# Patient Record
Sex: Female | Born: 1965
Health system: Southern US, Community
[De-identification: ages and names within clinical notes are randomized; demographics above are authoritative.]

## PROBLEM LIST (undated history)

## (undated) DIAGNOSIS — F319 Bipolar disorder, unspecified: Secondary | ICD-10-CM

## (undated) DIAGNOSIS — R51 Headache: Secondary | ICD-10-CM

## (undated) DIAGNOSIS — R519 Headache, unspecified: Secondary | ICD-10-CM

## (undated) DIAGNOSIS — K5790 Diverticulosis of intestine, part unspecified, without perforation or abscess without bleeding: Secondary | ICD-10-CM

## (undated) DIAGNOSIS — J302 Other seasonal allergic rhinitis: Secondary | ICD-10-CM

## (undated) DIAGNOSIS — Z8659 Personal history of other mental and behavioral disorders: Secondary | ICD-10-CM

## (undated) DIAGNOSIS — G43909 Migraine, unspecified, not intractable, without status migrainosus: Secondary | ICD-10-CM

## (undated) DIAGNOSIS — J45909 Unspecified asthma, uncomplicated: Secondary | ICD-10-CM

## (undated) DIAGNOSIS — K589 Irritable bowel syndrome without diarrhea: Secondary | ICD-10-CM

## (undated) DIAGNOSIS — K2100 Gastro-esophageal reflux disease with esophagitis, without bleeding: Secondary | ICD-10-CM

## (undated) DIAGNOSIS — G4710 Hypersomnia, unspecified: Secondary | ICD-10-CM

## (undated) DIAGNOSIS — C259 Malignant neoplasm of pancreas, unspecified: Secondary | ICD-10-CM

## (undated) DIAGNOSIS — R413 Other amnesia: Secondary | ICD-10-CM

## (undated) DIAGNOSIS — O139 Gestational [pregnancy-induced] hypertension without significant proteinuria, unspecified trimester: Secondary | ICD-10-CM

## (undated) HISTORY — DX: Unspecified asthma, uncomplicated: J45.909

## (undated) HISTORY — PX: NASAL SINUS SURGERY: SHX719

## (undated) HISTORY — PX: KNEE SURGERY: SHX244

## (undated) HISTORY — DX: Personal history of other mental and behavioral disorders: Z86.59

## (undated) HISTORY — PX: NOSE SURGERY: SHX723

## (undated) HISTORY — DX: Diverticulosis of intestine, part unspecified, without perforation or abscess without bleeding: K57.90

## (undated) HISTORY — DX: Other amnesia: R41.3

## (undated) HISTORY — PX: WRIST SURGERY: SHX841

## (undated) HISTORY — DX: Headache: R51

## (undated) HISTORY — DX: Migraine, unspecified, not intractable, without status migrainosus: G43.909

## (undated) HISTORY — PX: REPLACEMENT TOTAL KNEE: SUR1224

## (undated) HISTORY — DX: Headache, unspecified: R51.9

## (undated) HISTORY — DX: Other seasonal allergic rhinitis: J30.2

## (undated) HISTORY — DX: Irritable bowel syndrome, unspecified: K58.9

## (undated) HISTORY — DX: Bipolar disorder, unspecified: F31.9

## (undated) HISTORY — DX: Hypersomnia, unspecified: G47.10

## (undated) HISTORY — DX: Gestational (pregnancy-induced) hypertension without significant proteinuria, unspecified trimester: O13.9

---

## 1998-08-27 HISTORY — PX: ABDOMINAL HYSTERECTOMY: SHX81

## 2011-10-26 ENCOUNTER — Other Ambulatory Visit: Payer: Self-pay | Admitting: Family Medicine

## 2011-10-26 DIAGNOSIS — S0990XS Unspecified injury of head, sequela: Secondary | ICD-10-CM

## 2011-10-26 DIAGNOSIS — G44309 Post-traumatic headache, unspecified, not intractable: Secondary | ICD-10-CM

## 2011-10-27 ENCOUNTER — Ambulatory Visit
Admission: RE | Admit: 2011-10-27 | Discharge: 2011-10-27 | Disposition: A | Discharge status: Home / Self Care | Payer: 59 | Source: Ambulatory Visit | Attending: Family Medicine | Admitting: Family Medicine

## 2011-10-27 DIAGNOSIS — S0990XS Unspecified injury of head, sequela: Secondary | ICD-10-CM

## 2011-10-27 DIAGNOSIS — G44309 Post-traumatic headache, unspecified, not intractable: Secondary | ICD-10-CM

## 2013-03-17 ENCOUNTER — Other Ambulatory Visit: Payer: Self-pay | Admitting: Family Medicine

## 2013-03-17 DIAGNOSIS — R221 Localized swelling, mass and lump, neck: Secondary | ICD-10-CM

## 2013-03-19 ENCOUNTER — Ambulatory Visit
Admission: RE | Admit: 2013-03-19 | Discharge: 2013-03-19 | Disposition: A | Discharge status: Home / Self Care | Payer: 59 | Source: Ambulatory Visit | Attending: Family Medicine | Admitting: Family Medicine

## 2013-03-19 DIAGNOSIS — R221 Localized swelling, mass and lump, neck: Secondary | ICD-10-CM

## 2013-04-02 ENCOUNTER — Encounter: Payer: Self-pay | Admitting: Neurology

## 2013-04-03 ENCOUNTER — Encounter: Payer: Self-pay | Admitting: Neurology

## 2013-04-03 ENCOUNTER — Ambulatory Visit (INDEPENDENT_AMBULATORY_CARE_PROVIDER_SITE_OTHER): Payer: 59 | Admitting: Neurology

## 2013-04-03 VITALS — BP 129/68 | HR 78 | Ht 69.0 in | Wt 243.0 lb

## 2013-04-03 DIAGNOSIS — R4189 Other symptoms and signs involving cognitive functions and awareness: Secondary | ICD-10-CM | POA: Insufficient documentation

## 2013-04-03 DIAGNOSIS — F32A Depression, unspecified: Secondary | ICD-10-CM | POA: Insufficient documentation

## 2013-04-03 DIAGNOSIS — F3289 Other specified depressive episodes: Secondary | ICD-10-CM

## 2013-04-03 DIAGNOSIS — R5381 Other malaise: Secondary | ICD-10-CM

## 2013-04-03 DIAGNOSIS — F09 Unspecified mental disorder due to known physiological condition: Secondary | ICD-10-CM

## 2013-04-03 DIAGNOSIS — F329 Major depressive disorder, single episode, unspecified: Secondary | ICD-10-CM

## 2013-04-03 DIAGNOSIS — R5383 Other fatigue: Secondary | ICD-10-CM

## 2013-04-03 NOTE — Patient Instructions (Signed)
Overall you are doing fairly well but I do want to suggest a few things today:   As far as diagnostic testing:  1) I ordered some blood work for today. Please follow up with this prior to leaving the office 2) I put a referral in for a sleep study to help determine if sleep apnea may be the cause of your symptoms. You will be called to schedule this  I would like to see you back in 3-25months, sooner if we need to. Please call us with any interim questions, concerns, problems, updates or refill requests.   Please also call us for any test results so we can go over those with you on the phone.  My clinical assistant and will answer any of your questions and relay your messages to me and also relay most of my messages to you.   Our phone number is [phone]. We also have an after hours call service for urgent matters and there is a physician on-call for urgent questions. For any emergencies you know to call 911 or go to the nearest emergency room

## 2013-04-03 NOTE — Progress Notes (Signed)
[facility]  Provider:  Dr Hosie Poisson Referring Provider: Kandyce Rud, MD Primary Care Physician:  No primary provider on file.  No chief complaint on file.   HPI:  Robin [name] is a 47 y.o. female here as a referral from Dr. Larwance Sachs for cognitive decline  She notes her partner initially noted some memory difficulties around 2-3 years ago. Predominately trouble with short term memory. She has difficulty recalling what she is doing that day, trouble with conversation, word finding difficulty. Denies any trouble with remembering where she per her keys or wallet. No episodes of getting lost. Notes that her partner always pays the finances, do to the patient forgetting to pay them. She also notes some difficulty with remote memory, trouble remembering appointments in the past. Feels this is getting progressively worse over the past few years. She is a retired Emergency planning/management officer, currently working as a Database administrator. She does note some difficulty at work, she has trouble remembering things about her students. No history of head trauma. No family history of dementia.  She diagnosed with borderline, bipolar depression around 2-3 years ago and also history of migraines. She is followed by Dr. Bufford Buttner, psychiatrist. Is currently on a regimen of Seroquel the to do and Lamictal for mood stabilization. She has been on this for a few the ears and feels it is working well. Currently followed by Dr. Neale Burly for migraines, typically at 9-10 migraines per month. Currently takes Ativan 150 mg for headache. Feels this helps. Also will take baclofen 10 mg as needed for breakthrough headaches, typically does this one to 2 times per month. Will take Flexeril one to 2 times a month for severe pain.  Difficulty staying asleep, wakes up frequently, snores. No apnea events noted. Wakes up not feeling refreshed. Fatigued throughout the day.  Has associates degree.  Review of Systems: Out of a complete 14  system review, the patient complains of only the following symptoms, and all other reviewed systems are negative. Positive for weight gain fatigue snoring memory loss headache depression not enough sleep and  History   Social History  . Marital Status: Married    Spouse Name: N/A    Number of Children: N/A  . Years of Education: N/A   Occupational History  . Not on file.   Social History Main Topics  . Smoking status: Never Smoker   . Smokeless tobacco: Never Used  . Alcohol Use: 6.0 oz/week    12 drink(s) per week     Comment: monthly   . Drug Use: Not on file  . Sexually Active: Not on file   Other Topics Concern  . Not on file   Social History Narrative   Pneumonia vaccine 2005          No family history on file.  Past Medical History  Diagnosis Date  . Seasonal allergies   . HA (headache)     No past surgical history on file.  Current Outpatient Prescriptions  Medication Sig Dispense Refill  . baclofen (LIORESAL) 10 MG tablet Take 10 mg by mouth daily. Take with food or milk.      . cyclobenzaprine (FLEXERIL) 10 MG tablet Take 10 mg by mouth 3 (three) times daily as needed for muscle spasms.      Marland Kitchen lamoTRIgine (LAMICTAL) 100 MG tablet Take 100 mg by mouth daily.      Marland Kitchen lurasidone (LATUDA) 40 MG TABS tablet Take 40 mg by mouth daily with breakfast.      .  QUEtiapine (SEROQUEL XR) 300 MG 24 hr tablet Take 300 mg by mouth at bedtime.      Marland Kitchen zonisamide (ZONEGRAN) 100 MG capsule Take 100 mg by mouth daily.       No current facility-administered medications for this visit.    Allergies as of 04/03/2013 - never reviewed  Allergen Reaction Noted  . Aspirin Nausea And Vomiting and Rash 04/02/2013  . Bactrim (sulfamethoxazole-tmp ds)  04/02/2013  . Penicillins  04/02/2013    Vitals: There were no vitals taken for this visit. Last Weight:  Wt Readings from Last 1 Encounters:  No data found for Wt   Last Height:   Ht Readings from Last 1 Encounters:  No  data found for Ht     Physical exam: Exam: Gen: NAD, conversant, obese Eyes: anicteric sclerae, moist conjunctivae HENT: Atraumati Neck: Trachea midline; supple,  Lungs: CTA, no wheezing, rales, rhonic                          CV: RRR, no MRG Abdomen: Soft, non-tender;  Extremities: No peripheral edema  Skin: Normal temperature, no rash,  Psych: Appropriate affect, pleasant  Neuro: MS: AA&Ox3, appropriately interactive, normal affect   Attention: WORLD backwards  Speech: fluent w/o paraphasic error  Memory: MOCA 25/30, -1 orientation, -1 language, -3 delayed recall  CN: PERRL, EOMI no nystagmus, no ptosis, sensation intact to LT V1-V3 bilat, face symmetric, no weakness, hearing grossly intact, palate elevates symmetrically, shoulder shrug 5/5 bilat,  tongue protrudes midline, no fasiculations noted.  Motor: normal bulk and tone Strength: 5/5  In all extremities  Coord: rapid alternating and point-to-point (FNF, HTS) movements intact.  Reflexes: symmetrical, bilat downgoing toes  Sens: LT intact in all extremities  Gait: posture, stance, stride and arm-swing normal. Tandem gait intact. Able to walk on heels and toes. Romberg absent.   Assessment:  After physical and neurologic examination, review of laboratory studies, imaging, neurophysiology testing and pre-existing records, assessment will be reviewed on the problem list.  Plan:  Treatment plan and additional workup will be reviewed under Problem List.  Robin [name] is a pleasant 47 year old African American woman who presents for initial evaluation of 2-3 years of progressive cognitive decline. She notes the most difficulty with short-term memory, though does note some remote memory recall difficulties. She expresses difficulty sleeping and constant fatigue throughout the day.  1)Cognitive decline: Unclear etiology. Patient is on multiple medications that could explain a cognitive decline, she also has potential  sleep apnea/insomnia which may be contributing to her cognitive decline. History of depression also raises the possibility of depression mimicking dementia. Will need to rule out reversible and tubal causes before attributing cognitive decline T1 of the prior causes -Check B12, MMA and homocystine, TSH and RPR -Referral for sleep study -In the future can consider brain MRI, LP and/or formal neuropsych cognitive testing  2)Fatigue: Based on body habitus and clinical history suspect patient may be suffering from sleep apnea -Will place referral for sleep study  3) Depression: followed by psychiatry, currently stable  Followup in 3-4 months

## 2013-04-07 LAB — RPR: RPR: NONREACTIVE

## 2013-04-07 LAB — HOMOCYSTEINE: Homocysteine: 11 umol/L (ref 0.0–15.0)

## 2013-04-07 LAB — METHYLMALONIC ACID(MMA), RND URINE

## 2013-04-24 ENCOUNTER — Telehealth: Payer: Self-pay | Admitting: Neurology

## 2013-04-24 NOTE — Telephone Encounter (Signed)
ERROR

## 2013-04-29 ENCOUNTER — Encounter: Payer: Self-pay | Admitting: Neurology

## 2013-04-29 ENCOUNTER — Ambulatory Visit (INDEPENDENT_AMBULATORY_CARE_PROVIDER_SITE_OTHER): Payer: 59 | Admitting: Neurology

## 2013-04-29 VITALS — BP 109/69 | HR 65 | Resp 16 | Ht 69.0 in | Wt 236.0 lb

## 2013-04-29 DIAGNOSIS — R413 Other amnesia: Secondary | ICD-10-CM | POA: Insufficient documentation

## 2013-04-29 DIAGNOSIS — R519 Headache, unspecified: Secondary | ICD-10-CM

## 2013-04-29 DIAGNOSIS — R0609 Other forms of dyspnea: Secondary | ICD-10-CM

## 2013-04-29 DIAGNOSIS — G4710 Hypersomnia, unspecified: Secondary | ICD-10-CM | POA: Insufficient documentation

## 2013-04-29 DIAGNOSIS — E669 Obesity, unspecified: Secondary | ICD-10-CM

## 2013-04-29 DIAGNOSIS — R0683 Snoring: Secondary | ICD-10-CM

## 2013-04-29 DIAGNOSIS — R51 Headache: Secondary | ICD-10-CM

## 2013-04-29 HISTORY — DX: Hypersomnia, unspecified: G47.10

## 2013-04-29 NOTE — Progress Notes (Signed)
[facility]  Provider:  Melvyn Novas, M D  Referring Provider: Omelia Blackwater, DO Primary Care Physician:  No primary provider on file.  Chief Complaint  Patient presents with  . Follow-up    memory, MOCA-29/30,rm 10    HPI:  Robin [name] is a 47 y.o. female  Is seen here as a referral/ revisit  from Dr. Hosie Poisson for  a sleep evaluation.  Number reported that Robin [name], is a pleasant 47 year old [place] American female presented to initially for an evaluation of progressive cognitive decline. She has noticed moles difficulties with short-term memory. As the patient has a history of depression the possibility of depression causing a subdural dementia was raised. He check vitamin B12 levels homocystine, TSH, and RPR with negative results and referred the patient also for sleep study, based on her body habitus with an elevated BMI and her report of fatigue and excessive daytime sleepiness.  My assistant today repeated the [facility] testing the patient scored 29 of 30 points which is entirely normal. She also couldn't name 24 warts. The only point loss was in one of 5  recall words. Robin [name] today and Doris the Epworth sleepiness score at 17/24 points. She reports that she is snoring rather loudly, but she has not had any report as to witnessed apneas.  The patient regular bedtime is at 30 p.m. and she rises in the morning at 6 AM. She reports it is very variable when she falls asleep it can be immediately after going to bed, and at other times she will need up to an hour or not have nocturia, she does not have morning headaches, she may wake up with a headache but not from the headache she reports further. Migraines have been followed by Dr. Neale Burly of headache and vomits and. She awakens with a dr. She does not feel refreshed after sleep. She does not schedule and he nap time but sometimes just follows irresistible urge to fall asleep. A nap the last between 30-60  minutes, and seems to cause more sleepiness and fatigue rather than restoring the patient.   She denies recurring nightmares, not particular vivid dreams. No dream intrusion and no hallucination.no sleep paralysis.   Patient does not recall any history of a sleep disorder in her past, family history of sleep disorders is not known either . 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 As the not trauma to the neck upper airway or facial skull there has been no surgery to the jaw, neck, but she had a deviated septum repair in 1992.   Review of Systems: Out of a complete 14 system review, the patient complains of only the following symptoms, and all other reviewed systems are negative.  epworth 17, FSS 30 , GDS 5 points.   History   Social History  . Marital Status: Married    Spouse Name: Haynes Bast    Number of Children: 2  . Years of Education: AA   Occupational History  .      [employer]   Social History Main Topics  . Smoking status: Never Smoker   . Smokeless tobacco: Never Used  . Alcohol Use: 6.0 oz/week    12 drink(s) per week     Comment: 1-2 monthly   . Drug Use: No  . Sexual Activity: Not on file   Other Topics Concern  .  Not on file   Social History Narrative   Pneumonia vaccine 2005   Patient lives at home with wife and 2 sons.    Patient works in [employer].    Patient has her AA             Family History  Problem Relation Age of Onset  . High blood pressure Maternal Grandfather   . Depression Mother   . Breast cancer Maternal Grandmother   . Diabetes Maternal Grandmother   . High blood pressure Maternal Grandmother   . Breast cancer Sister     Past Medical History  Diagnosis Date  . Seasonal allergies   . HA (headache)   . Hypertension   . Migraine   . Memory loss, short term     dr Hosie Poisson 04-03-13   .  Hypersomnia, persistent 04/29/2013    Past Surgical History  Procedure Laterality Date  . Knee surgery Right 96,98,00,02,05,2014  . Wrist surgery Right [phone]  . Abdominal hysterectomy  00  . Nose surgery      [id number]    Current Outpatient Prescriptions  Medication Sig Dispense Refill  . baclofen (LIORESAL) 10 MG tablet Take 10 mg by mouth daily. Take with food or milk.      . cyclobenzaprine (FLEXERIL) 10 MG tablet Take 10 mg by mouth 3 (three) times daily as needed for muscle spasms.      Marland Kitchen lamoTRIgine (LAMICTAL) 100 MG tablet Take 100 mg by mouth daily.      Marland Kitchen lurasidone (LATUDA) 40 MG TABS tablet Take 40 mg by mouth daily with breakfast.      . meloxicam (MOBIC) 15 MG tablet Take 15 mg by mouth daily.      . QUEtiapine (SEROQUEL XR) 300 MG 24 hr tablet Take 300 mg by mouth at bedtime.      Marland Kitchen zonisamide (ZONEGRAN) 100 MG capsule Take 150 mg by mouth daily.        No current facility-administered medications for this visit.    Allergies as of 04/29/2013 - Review Complete 04/29/2013  Allergen Reaction Noted  . Aspirin Nausea And Vomiting and Rash 04/02/2013  . Bactrim [sulfamethoxazole-tmp ds]  04/02/2013  . Penicillins  04/02/2013    Vitals: BP 109/69  Pulse 65  Resp 16  Ht 5\' 9"  (1.753 m)  Wt 236 lb (107.049 kg)  BMI 34.84 kg/m2 Last Weight:  Wt Readings from Last 1 Encounters:  04/29/13 236 lb (107.049 kg)   Last Height:   Ht Readings from Last 1 Encounters:  04/29/13 5\' 9"  (1.753 m)    Physical exam:  General: The patient is awake, alert and appears not in acute distress. The patient is well groomed. Head: Normocephalic, atraumatic. Neck is supple. Mallampati 3 , neck circumference: 15.5 and right jaw  TMJ, no nasal deviation. status post  surgery . No history of retainer, no retrognathia .  Cardiovascular:  Regular rate and rhythm, without  murmurs or carotid bruit, and without distended neck veins. Respiratory: Lungs are clear to auscultation. Skin:   Without evidence of edema, or rash Trunk: BMI is elevated - patient  has normal posture.  Neurologic exam : The patient is awake and alert, oriented to place and time.  Memory subjective  described as impaired - MOCA was 29-30 . There is a normal attention span & concentration ability. Speech is fluent without dysarthria, dysphonia or aphasia. Mood and affect are appropriate.  Cranial nerves: Pupils are equal and briskly reactive to light. Funduscopic exam  without  evidence of pallor or edema. Extraocular movements  in vertical and horizontal planes intact and without nystagmus. Visual fields by finger perimetry are intact. Hearing to finger rub intact.  Facial sensation intact to fine touch. Facial motor strength is symmetric and tongue and uvula move midline.  Motor exam:   Normal tone and normal muscle bulk and symmetric normal strength in all extremities. The patient reports left shoulder and neck pain and right temporal headaches.    Sensory:  Fine touch, pinprick and vibration were tested in all extremities. Proprioception is  normal.  Coordination: Rapid alternating movements in the fingers/hands is tested and normal. Finger-to-nose maneuver without evidence of ataxia, dysmetria or tremor.  Gait and station: Patient walks without assistive device and is able and assisted stool climb up to the exam table. Strength within normal limits. Stance is stable and normal.  Steps are unfragmented. Deep tendon reflexes: in the  upper and lower extremities are symmetric and intact. Babinski maneuver response is  downgoing.   Assessment:  After physical and neurologic examination, review of laboratory studies, imaging, neurophysiology testing and pre-existing records, assessment is that of high risk for OSA based on BMI and history, Mallpompati. 4 , invisible UVULA .   2) left shoulder pain the patient prefers sleeping in supine which would probably aggravate any condition of apnea. It also will lead  to louder snoring given that her oral airway is restricted to begin with. 30 Epworth 17 , but no symptoms of narcolepsy.   Plan:  Treatment plan and additional workup : PSG SPLIT ordered, follow up with Dr Hosie Poisson if negative for OSA.  Tennisball methode.  For positional , behavior changes.  No changes in medication.

## 2013-04-29 NOTE — Patient Instructions (Signed)
Polysomnography (Sleep Studies) Polysomnography (PSG) is a series of tests used for detecting (diagnosing) obstructive sleep apnea and other sleep disorders. The tests measure how some parts of your body are working while you are sleeping. The tests are extensive and expensive. They are done in a sleep lab or hospital, and vary from center to center. Your caregiver may perform other more simple sleep studies and questionnaires before doing more complete and involved testing. Testing may not be covered by insurance. Some of these tests are:  An EEG (Electroencephalogram). This tests your brain waves and stages of sleep.  An EOG (Electrooculogram). This measures the movements of your eyes. It detects periods of REM (rapid eye movement) sleep, which is your dream sleep.  An EKG (Electrocardiogram). This measures your heart rhythm.  EMG (Electromyography). This is a measurement of how the muscles are working in your upper airway and your legs while sleeping.  An oximetry measurement. It measures how much oxygen (air) you are getting while sleeping.  Breathing efforts may be measured. The same test can be interpreted (understood) differently by different caregivers and centers that study sleep.  Studies may be given an apnea/hypopnea index (AHI). This is a number which is found by counting the times of no breathing or under breathing during the night, and relating those numbers to the amount of time spent in bed. When the AHI is greater than 15, the patient is likely to complain of daytime sleepiness. When the AHI is greater than 30, the patient is at increased risk for heart problems and must be followed more closely. Following the AHI also allows you to know how treatment is working. Simple oximetry (tracking the amount of oxygen that is taken in) can be used for screening patients who:  Do not have symptoms (problems) of OSA.  Have a normal Epworth Sleepiness Scale Score.  Have a low pre-test  probability of having OSA.  Have none of the upper airway problems likely to cause apnea.  Oximetry is also used to determine if treatment is effective in patients who showed significant desaturations (not getting enough oxygen) on their home sleep study. One extra measure of safety is to perform additional studies for the person who only snores. This is because no one can predict with absolute certainty who will have OSA. Those who show significant desaturations (not getting enough oxygen) are recommended to have a more detailed sleep study. Document Released: 02/17/2003 Document Revised: 11/05/2011 Document Reviewed: 08/13/2005 [facility] Patient Information 2014 [place], [place]. Sleep Apnea Sleep apnea is disorder that affects a person's sleep. A person with sleep apnea has abnormal pauses in their breathing when they sleep. It is hard for them to get a good sleep. This makes a person tired during the day. It also can lead to other physical problems. There are three types of sleep apnea. One type is when breathing stops for a short time because your airway is blocked (obstructive sleep apnea). Another type is when the brain sometimes fails to give the normal signal to breathe to the muscles that control your breathing (central sleep apnea). The third type is a combination of the other two types. HOME CARE  Do not sleep on your back. Try to sleep on your side.  Take all medicine as told by your doctor.  Avoid alcohol, calming medicines (sedatives), and depressant drugs.  Try to lose weight if you are overweight. Talk to your doctor about a healthy weight goal. Your doctor may have you use a device  that helps to open your airway. It can help you get the air that you need. It is called a positive airway pressure (PAP) device. There are three types of PAP devices:  Continuous positive airway pressure (CPAP) device.  Nasal expiratory positive airway pressure (EPAP) device.  Bilevel positive  airway pressure (BPAP) device. MAKE SURE YOU:  Understand these instructions.  Will watch your condition.  Will get help right away if you are not doing well or get worse. Document Released: 05/22/2008 Document Revised: 07/30/2012 Document Reviewed: 12/15/2011 [facility] Patient Information 2014 [place], [place].

## 2013-05-07 ENCOUNTER — Telehealth: Payer: Self-pay | Admitting: Neurology

## 2013-05-07 NOTE — Telephone Encounter (Signed)
I called and spoke with the patient about scheduling her sleep study. I informed the patient that per [facility] she hasn't met her $500.00 deductible and once that is met they will pay 90% of the cost for the sleep study. I informed the patient that so far she has met $113.50, so the est. Cost of the sleep study would be $515.56. Patient stated she will call back.

## 2013-07-01 ENCOUNTER — Encounter: Payer: Self-pay | Admitting: Neurology

## 2013-07-01 ENCOUNTER — Encounter (INDEPENDENT_AMBULATORY_CARE_PROVIDER_SITE_OTHER): Payer: Self-pay

## 2013-07-01 ENCOUNTER — Ambulatory Visit (INDEPENDENT_AMBULATORY_CARE_PROVIDER_SITE_OTHER): Payer: 59 | Admitting: Neurology

## 2013-07-01 VITALS — BP 97/60 | HR 70 | Ht 70.0 in | Wt 231.0 lb

## 2013-07-01 DIAGNOSIS — R5383 Other fatigue: Secondary | ICD-10-CM

## 2013-07-01 DIAGNOSIS — F09 Unspecified mental disorder due to known physiological condition: Secondary | ICD-10-CM

## 2013-07-01 DIAGNOSIS — R4189 Other symptoms and signs involving cognitive functions and awareness: Secondary | ICD-10-CM

## 2013-07-01 DIAGNOSIS — R51 Headache: Secondary | ICD-10-CM

## 2013-07-01 DIAGNOSIS — R5381 Other malaise: Secondary | ICD-10-CM

## 2013-07-01 NOTE — Progress Notes (Signed)
[facility]   Provider:  Dr Hosie Poisson Referring Provider: No ref. provider found Primary Care Physician:  Neldon Labella, MD  CC:  Cognitive decline and fatigue  HPI:  Robin [name] is a 47 y.o. female here as a follow up of her concerns of fatigue and cognitive decline  Patient continues to have difficulty with cognitive decline, especially trouble with short term memory. Day-to-day forgetfulness. Also notes fatigue. She was evaluated in the sleep clinic and concern for sleep apnea was raised. Patient reports she does not currently have the financial means to have a sleep study done. She continues to have chronic headaches, is followed by the headache clinic and is considering Botox therapy.   Initial Visit 03/2013: She notes her partner initially noted some memory difficulties around 2-3 years ago. Predominately trouble with short term memory. She has difficulty recalling what she is doing that day, trouble with conversation, word finding difficulty. Denies any trouble with remembering where she per her keys or wallet. No episodes of getting lost. Notes that her partner always pays the finances, do to the patient forgetting to pay them. She also notes some difficulty with remote memory, trouble remembering appointments in the past. Feels this is getting progressively worse over the past few years. She is a retired Emergency planning/management officer, currently working as a Database administrator. She does note some difficulty at work, she has trouble remembering things about her students. No history of head trauma. No family history of dementia.  She diagnosed with borderline, bipolar depression around 2-3 years ago and also history of migraines. She is followed by Dr. Bufford Buttner, psychiatrist. Is currently on a regimen of Seroquel the to do and Lamictal for mood stabilization. She has been on this for a few the ears and feels it is working well. Currently followed by Dr. Neale Burly for migraines, typically at  9-10 migraines per month. Currently takes Ativan 150 mg for headache. Feels this helps. Also will take baclofen 10 mg as needed for breakthrough headaches, typically does this one to 2 times per month. Will take Flexeril one to 2 times a month for severe pain.  Difficulty staying asleep, wakes up frequently, snores. No apnea events noted. Wakes up not feeling refreshed. Fatigued throughout the day.   Concerns/Questions:Review of Systems: Out of a complete 14 system review, the patient complains of only the following symptoms, and all other reviewed systems are negative. Positive for fatigue insomnia headache forgetfulness memory trouble  History   Social History  . Marital Status: Married    Spouse Name: Haynes Bast    Number of Children: 2  . Years of Education: AA   Occupational History  .      [employer]   Social History Main Topics  . Smoking status: Never Smoker   . Smokeless tobacco: Never Used  . Alcohol Use: 6.0 oz/week    12 drink(s) per week     Comment: 1-2 monthly   . Drug Use: No  . Sexual Activity: Not on file   Other Topics Concern  . Not on file   Social History Narrative   Pneumonia vaccine 2005   Patient lives at home with wife and 2 sons.    Patient works in [employer].    Patient has her AA             Family History  Problem Relation Age of Onset  . High blood pressure Maternal Grandfather   . Depression Mother   . Breast cancer Maternal Grandmother   .  Diabetes Maternal Grandmother   . High blood pressure Maternal Grandmother   . Breast cancer Sister     Past Medical History  Diagnosis Date  . Seasonal allergies   . HA (headache)   . Hypertension   . Migraine   . Memory loss, short term     dr Hosie Poisson 04-03-13   . Hypersomnia, persistent 04/29/2013    Past Surgical History  Procedure Laterality Date  . Knee surgery Right 96,98,00,02,05,2014  . Wrist surgery Right [phone]  . Abdominal hysterectomy  00  . Nose surgery       [id number]    Current Outpatient Prescriptions  Medication Sig Dispense Refill  . baclofen (LIORESAL) 10 MG tablet Take 10 mg by mouth daily. Take with food or milk.      . cyclobenzaprine (FLEXERIL) 10 MG tablet Take 10 mg by mouth 3 (three) times daily as needed for muscle spasms.      Marland Kitchen lamoTRIgine (LAMICTAL) 100 MG tablet Take 100 mg by mouth daily.      Marland Kitchen lurasidone (LATUDA) 40 MG TABS tablet Take 40 mg by mouth daily with breakfast.      . meloxicam (MOBIC) 15 MG tablet Take 15 mg by mouth daily.      . QUEtiapine (SEROQUEL XR) 300 MG 24 hr tablet Take 300 mg by mouth at bedtime.       No current facility-administered medications for this visit.    Allergies as of 07/01/2013 - Review Complete 07/01/2013  Allergen Reaction Noted  . Aspirin Nausea And Vomiting and Rash 04/02/2013  . Bactrim [sulfamethoxazole-tmp ds]  04/02/2013  . Penicillins  04/02/2013    Vitals: BP 97/60  Pulse 70  Ht 5\' 10"  (1.778 m)  Wt 231 lb (104.781 kg)  BMI 33.15 kg/m2 Last Weight:  Wt Readings from Last 1 Encounters:  07/01/13 231 lb (104.781 kg)   Last Height:   Ht Readings from Last 1 Encounters:  07/01/13 5\' 10"  (1.778 m)    Physical exam:  Exam:  Gen: NAD, conversant, obese  Eyes: anicteric sclerae, moist conjunctivae  HENT: Atraumati  Neck: Trachea midline; supple,  Lungs: CTA, no wheezing, rales, rhonic  CV: RRR, no MRG  Abdomen: Soft, non-tender;  Extremities: No peripheral edema  Skin: Normal temperature, no rash,  Psych: Appropriate affect, pleasant  Neuro:  MS: AA&Ox3, appropriately interactive, normal affect   CN:  PERRL, EOMI no nystagmus, no ptosis, sensation intact to LT V1-V3 bilat, face symmetric, no weakness, hearing grossly intact, palate elevates symmetrically, shoulder shrug 5/5 bilat,  tongue protrudes midline, no fasiculations noted.  Motor: normal bulk and tone  Strength:  5/5 In all extremities  Coord: rapid alternating and point-to-point (FNF, HTS)  movements intact.  Sens: LT intact in all extremities  Gait: posture, stance, stride and arm-swing normal.    Assessment:  After physical and neurologic examination, review of laboratory studies, imaging, neurophysiology testing and pre-existing records, assessment will be reviewed on the problem list.  Plan:  Treatment plan and additional workup will be reviewed under Problem List.  1)Cognitive decline 2)Fatigue 3)Migraine  47 year old woman with history of cognitive decline, fatigue and headaches presenting for followup visit. Since last visit she was evaluated to clinic with concerns for sleep apnea. She has not followed through with a sleep study at this time. I feel the patient's symptoms again are likely related to possible strep to sleep apnea and tonsillar on the importance and possible long-term ramifications of not following through with sleep study.  Patient expressed understanding and will consider following through a sleep study. She will continue to followup at the headache clinic for further management of her migraines at this time. Followup as needed.

## 2013-07-01 NOTE — Patient Instructions (Signed)
Overall you are doing fairly well but I do want to suggest a few things today:   Remember to drink plenty of fluid, eat healthy meals and do not skip any meals. Try to eat protein with a every meal and eat a healthy snack such as fruit or nuts in between meals. Try to keep a regular sleep-wake schedule and try to exercise daily, particularly in the form of walking, 20-30 minutes a day, if you can.   I feel that your symptoms are mainly due to possible sleep apnea. You would benefit from a formal sleep study.   Our phone number is [phone]. We also have an after hours call service for urgent matters and there is a physician on-call for urgent questions. For any emergencies you know to call 911 or go to the nearest emergency room

## 2013-07-03 ENCOUNTER — Telehealth: Payer: Self-pay | Admitting: Neurology

## 2013-07-03 NOTE — Telephone Encounter (Signed)
Called and spoke to patient after reviewing her chart and seeing that she has had a sleep study ordered but is unable to afford her deductible.  Explained to pt that her symptoms do sound like it could be related to a sleep disorder, however without a sleep test (whether home or in lab) it is difficult to know if that is exactly what is going on and to what extent.  Her deductible will likely apply to in lab study and may apply to HST as well.  I explained to her that if what she has is a sleep disordered breathing, she may benefit from sleeping upright in a recliner or staying off her back during sleep.  I also expressed that if she is someone who could benefit from losing weight and still be healthy, that weight loss can improve the severity of sleep apnea in many cases.    I also explained that Dr. Hosie Poisson stated in his visit notes that patient's symptoms could be related to either a sleep disorder or perhaps strep or another infection.  She could always pursue the infection route with her PCP and see if her symptoms improve.

## 2013-07-03 NOTE — Telephone Encounter (Signed)
Forwarding this to Kissa.  Robin [name] will you call this patient please?

## 2013-07-03 NOTE — Telephone Encounter (Signed)
Recommendations for alternative therapies will have to come from the referring provider unless she would like to proceed with an hst.  Will forward to [facility].

## 2013-07-09 LAB — HM HIV SCREENING LAB: HM HIV SCREENING: NEGATIVE

## 2013-08-02 ENCOUNTER — Emergency Department (HOSPITAL_COMMUNITY)
Admission: EM | Admit: 2013-08-02 | Discharge: 2013-08-02 | Disposition: A | Discharge status: Home / Self Care | Payer: 59

## 2013-10-28 DIAGNOSIS — G43909 Migraine, unspecified, not intractable, without status migrainosus: Secondary | ICD-10-CM | POA: Insufficient documentation

## 2013-11-25 DIAGNOSIS — G4730 Sleep apnea, unspecified: Secondary | ICD-10-CM | POA: Insufficient documentation

## 2013-12-23 DIAGNOSIS — E669 Obesity, unspecified: Secondary | ICD-10-CM | POA: Insufficient documentation

## 2014-11-23 ENCOUNTER — Encounter (INDEPENDENT_AMBULATORY_CARE_PROVIDER_SITE_OTHER): Payer: Self-pay

## 2014-11-23 ENCOUNTER — Encounter (HOSPITAL_COMMUNITY): Payer: Self-pay | Admitting: Psychiatry

## 2014-11-23 ENCOUNTER — Ambulatory Visit (INDEPENDENT_AMBULATORY_CARE_PROVIDER_SITE_OTHER): Payer: 59 | Admitting: Psychiatry

## 2014-11-23 VITALS — BP 131/83 | HR 111 | Ht 70.0 in | Wt 237.6 lb

## 2014-11-23 DIAGNOSIS — Z79899 Other long term (current) drug therapy: Secondary | ICD-10-CM

## 2014-11-23 DIAGNOSIS — F319 Bipolar disorder, unspecified: Secondary | ICD-10-CM

## 2014-11-23 MED ORDER — LURASIDONE HCL 40 MG PO TABS: 40.0000 mg | ORAL_TABLET | Freq: Every day | ORAL | Status: DC

## 2014-11-23 MED ORDER — LAMOTRIGINE 100 MG PO TABS: 100.0000 mg | ORAL_TABLET | Freq: Every day | ORAL | Status: DC

## 2014-11-23 MED ORDER — QUETIAPINE FUMARATE ER 300 MG PO TB24: 300.0000 mg | ORAL_TABLET | Freq: Every day | ORAL | Status: DC

## 2014-11-23 NOTE — Progress Notes (Signed)
Psychiatric Assessment Adult  Patient Identification:  Priscilla Kirstein Date of Evaluation:  11/23/2014 Chief Complaint: Bipolar disorder History of Chief Complaint:   Chief Complaint  Patient presents with  . Establish Care    HPI Comments: Pt here to establish care. Pt here with wife and young son. 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 Pt last saw a psychiatrist 5 months ago. Pt has been taking Latuda, Seroquel and Lamictal for last 33 months. Overall she is stable and notes only SE is weight gain.   Pt has been diagnosed with hypersolumance and is being treated by a neurologist with Nuvigil, Adderall and Elavil.  Today denies depression, isolation, crying spells, anhedonia, worthlessness and hopelessness. Sleep, appetite, energy are good. Concentration is on the poor side. Denies SI/HI.  Today Denies manic and hypomanic symptoms including periods of decreased need for sleep, increased energy, mood lability, impulsivity, FOI, and excessive spending.  Per wife who provided collateral information. States pt is doing well and is stable on this combination. States symptoms are under control and pt is able to handle stress well.    Review of Systems Physical Exam  Psychiatric: Her speech is normal and behavior is normal. Judgment and thought content normal. Her affect is blunt. Cognition and memory are normal.    Depressive Symptoms: see HPI  Previously when depressed she has had sad mood, crying spells, isolation, anhedonia and low motivation  (Hypo) Manic Symptoms:  Periods of increased energy with decreased need for sleep. Since being on this combination of meds pt has not had any further manic symptoms.  Elevated Mood:  Yes Irritable Mood:  Yes Grandiosity:  No Distractibility:  Yes Labiality of Mood:  Yes Delusions:  No Hallucinations:  No Impulsivity:  Yes moving back and forth to home and home town repeatedly in 2 week period of time Sexually  Inappropriate Behavior:  Yes hypersexual with one partner Financial Extravagance:  Yes spend all of the money in her bank account Flight of Ideas:  Yes  Anxiety Symptoms: Excessive Worry:  No Panic Symptoms:  No Agoraphobia:  No Obsessive Compulsive: No  Symptoms: None, Specific Phobias:  No Social Anxiety:  No  Psychotic Symptoms:  Hallucinations: No None Delusions:  No Paranoia:  No   Ideas of Reference:  No  PTSD Symptoms: Ever had a traumatic exposure:  No Had a traumatic exposure in the last month:  No Re-experiencing: No None Hypervigilance:  No Hyperarousal: No None Avoidance: No None  Traumatic Brain Injury: No   Past Psychiatric History: Diagnosis: Bipolar d/o, Borderline PD  Hospitalizations: denies  Outpatient Care: [facility] in [place]- Dr. Charlean Merl; Yetta Barre  Substance Abuse Care: denies  Self-Mutilation: denies  Suicidal Attempts: denies, has access to guns- they are locked up  Violent Behaviors: denies   Past Medical History:   Past Medical History  Diagnosis Date  . Seasonal allergies   . HA (headache)   . Migraine   . Memory loss, short term     [name] 04-03-13   . Hypersomnia, persistent 04/29/2013  . History of borderline personality disorder   . Bipolar disorder    History of Loss of Consciousness:  Yes Seizure History:  No Cardiac History:  No Allergies:   Allergies  Allergen Reactions  . Aspirin Nausea And Vomiting and Rash  . Bee Venom Anaphylaxis  . Bactrim [Sulfamethoxazole-Trimethoprim]   . Penicillins   . Sulfa Antibiotics Nausea And Vomiting   Current Medications:  Current  Outpatient Prescriptions  Medication Sig Dispense Refill  . amitriptyline (ELAVIL) 50 MG tablet Take 50 mg by mouth at bedtime.    Marland Kitchen amphetamine-dextroamphetamine (ADDERALL) 10 MG tablet Take 10 mg by mouth daily with breakfast.    . Armodafinil 250 MG tablet Take 250 mg by mouth daily.    Marland Kitchen lamoTRIgine (LAMICTAL) 100 MG tablet Take 100 mg  by mouth daily.    Marland Kitchen lurasidone (LATUDA) 40 MG TABS tablet Take 40 mg by mouth daily with breakfast.    . QUEtiapine (SEROQUEL XR) 300 MG 24 hr tablet Take 300 mg by mouth at bedtime.    . baclofen (LIORESAL) 10 MG tablet Take 10 mg by mouth daily. Take with food or milk.    . cyclobenzaprine (FLEXERIL) 10 MG tablet Take 10 mg by mouth 3 (three) times daily as needed for muscle spasms.    . meloxicam (MOBIC) 15 MG tablet Take 15 mg by mouth daily.     No current facility-administered medications for this visit.    Previous Psychotropic Medications:  Medication Dose  Can't recall other meds                       Substance Abuse History in the last 12 months: Substance Age of 1st Use Last Use Amount Specific Type  Nicotine  denies     Alcohol     1 drink every few months    Cannabis  denies     Opiates  denies     Cocaine  denies     Methamphetamines  denies     LSD  denies     Ecstasy  denies     Benzodiazepines  denies     Caffeine    8 sodas per month    Inhalants  denies     Others:  denies                         Medical Consequences of Substance Abuse: denies  Legal Consequences of Substance Abuse: denies  Family Consequences of Substance Abuse: denies  Blackouts:  No DT's:  No Withdrawal Symptoms:  No None  Social History: Current Place of Residence: [place] with wife and 2 kids Place of Birth: [place], [place] and father was Mannie Stabile so family moved around a lot. Raised by parents, her childhood was lonely and parents were distant Family Members: parents, 2 brothers Marital Status:  Married 4 yrs Children: 2  Sons: 2  Daughters: 0 Relationships: support from wife Education:  working on 4 yr degree at [employer]. she will transfer to [employer]. Educational Problems/Performance: good Religious Beliefs/Practices: denies History of Abuse: emotional (parents) Occupational Experiences: Librarian, academic, officer retired in [employer] History:  None. Legal History:  denies Hobbies/Interests: fishing, bowling, Video games  Family History:   Family History  Problem Relation Age of Onset  . High blood pressure Maternal Grandfather   . Depression Mother   . Breast cancer Maternal Grandmother   . Diabetes Maternal Grandmother   . High blood pressure Maternal Grandmother   . Breast cancer Sister   . Schizophrenia Brother     Mental Status Examination/Evaluation: Objective: Attitude: Calm and cooperative  Appearance: Fairly Groomed, appears to be stated age  Eye Contact::  Fair  Speech:  Clear and Coherent and Normal Rate  Volume:  Normal  Mood:  euthymic  Affect:  Blunt  Thought Process:  Goal Directed  Orientation:  Full (Time, Place, and  Person)  Thought Content:  Negative  Suicidal Thoughts:  No  Homicidal Thoughts:  No  Judgement:  Fair  Insight:  Good  Concentration: good  Memory: Immediate-good Recent-good Remote-good  Recall: fair  Language: fair  Gait and Station: normal  [employer] of Knowledge: average  Psychomotor Activity:  Normal  Akathisia:  No  Handed:  Right  AIMS (if indicated):  Facial and Oral Movements  Muscles of Facial Expression: None, normal  Lips and Perioral Area: None, normal  Jaw: None, normal  Tongue: None, normal Extremity Movements: Upper (arms, wrists, hands, fingers): None, normal  Lower (legs, knees, ankles, toes): None, normal,  Trunk Movements:  Neck, shoulders, hips: None, normal,  Overall Severity : Severity of abnormal movements (highest score from questions above): None, normal  Incapacitation due to abnormal movements: None, normal  Patient's awareness of abnormal movements (rate only patient's report): No Awareness, Dental Status  Current problems with teeth and/or dentures?: No  Does patient usually wear dentures?: No    Assets:  Communication Skills Desire for [employer] Talents/Skills Transportation        Laboratory/X-Ray  Psychological Evaluation(s)   none  denies   Assessment:    AXIS I Bipolar I d/o- current episode unspecified  AXIS II Borderline Personality Dis. by hx  AXIS III Past Medical History  Diagnosis Date  . Seasonal allergies   . HA (headache)   . Migraine   . Memory loss, short term     [name] 04-03-13   . Hypersomnia, persistent 04/29/2013  . History of borderline personality disorder   . Bipolar disorder      AXIS IV other psychosocial or environmental problems  AXIS V 51-60 moderate symptoms   Treatment Plan/Recommendations:  Plan of Care:  Medication management with supportive therapy. Risks/benefits and SE of the medication discussed. Pt verbalized understanding and verbal consent obtained for treatment.  Affirm with the patient that the medications are taken as ordered. Patient expressed understanding of how their medications were to be used.   Confidentiality and exclusions reviewed with pt who verbalized understanding.    Laboratory:  CBC, CMP, HbA1c, Lipid panel, TSH, Prolactin level, EKG   Psychotherapy: Therapy: brief supportive therapy provided. Discussed psychosocial stressors in detail.     Medications: Continue Lamictal 100mg  po qD for mood Latuda 40mg  po qD for mood Seroquel XR 300mg  po qHS for mood  Pt reports she has been on this combination for over 2 yrs and is stable. Pt appears to be in need of treatment with 2 antipsychotics as she denies manic episodes on this combination. Benefit outweighs the risk and pt denies SE.   Routine PRN Medications:  No  Consultations: encouraged to continue therapy with Gevena Barre  Safety Concerns:  Pt denies SI and is at an acute low risk for suicide.Patient told to call clinic if any problems occur. Patient advised to go to ER if they should develop SI/HI, side effects, or if symptoms worsen. Has crisis numbers to call if needed. Pt verbalized understanding.   Other:  F/up in 3 months or sooner if needed     Charlcie Cradle, MD 3/29/20161:52 PM

## 2014-12-09 LAB — COMPLETE METABOLIC PANEL WITH GFR
ALK PHOS: 55 U/L (ref 39–117)
ALT: 40 U/L — AB (ref 0–35)
AST: 27 U/L (ref 0–37)
Albumin: 3.7 g/dL (ref 3.5–5.2)
BUN: 9 mg/dL (ref 6–23)
CHLORIDE: 104 meq/L (ref 96–112)
CO2: 26 mEq/L (ref 19–32)
Calcium: 9.1 mg/dL (ref 8.4–10.5)
Creat: 1.05 mg/dL (ref 0.50–1.10)
GFR, Est African American: 72 mL/min
GFR, Est Non African American: 63 mL/min
Glucose, Bld: 80 mg/dL (ref 70–99)
POTASSIUM: 4.5 meq/L (ref 3.5–5.3)
SODIUM: 138 meq/L (ref 135–145)
Total Bilirubin: 0.6 mg/dL (ref 0.2–1.2)
Total Protein: 6.8 g/dL (ref 6.0–8.3)

## 2014-12-09 LAB — LIPID PANEL
CHOLESTEROL: 174 mg/dL (ref 0–200)
HDL: 35 mg/dL — AB (ref >=46)
LDL Cholesterol: 120 mg/dL — ABNORMAL HIGH (ref 0–99)
TRIGLYCERIDES: 97 mg/dL (ref <150)
Total CHOL/HDL Ratio: 5 Ratio
VLDL: 19 mg/dL (ref 0–40)

## 2014-12-09 LAB — CBC
HCT: 40.6 % (ref 36.0–46.0)
Hemoglobin: 12.8 g/dL (ref 12.0–15.0)
MCH: 27.1 pg (ref 26.0–34.0)
MCHC: 31.5 g/dL (ref 30.0–36.0)
MCV: 86 fL (ref 78.0–100.0)
MPV: 10.8 fL (ref 8.6–12.4)
Platelets: 269 10*3/uL (ref 150–400)
RBC: 4.72 MIL/uL (ref 3.87–5.11)
RDW: 14.6 % (ref 11.5–15.5)
WBC: 7.6 10*3/uL (ref 4.0–10.5)

## 2014-12-09 LAB — HEMOGLOBIN A1C
Hgb A1c MFr Bld: 5.7 % — ABNORMAL HIGH (ref <5.7)
Mean Plasma Glucose: 117 mg/dL — ABNORMAL HIGH (ref <117)

## 2014-12-09 LAB — TSH: TSH: 1.395 u[IU]/mL (ref 0.350–4.500)

## 2014-12-09 LAB — PROLACTIN: Prolactin: 11.4 ng/mL

## 2015-01-13 ENCOUNTER — Ambulatory Visit (HOSPITAL_COMMUNITY): Payer: Self-pay | Admitting: Psychiatry

## 2015-02-01 ENCOUNTER — Ambulatory Visit (HOSPITAL_COMMUNITY): Payer: Self-pay | Admitting: Psychiatry

## 2015-02-07 ENCOUNTER — Emergency Department (HOSPITAL_COMMUNITY)
Admission: EM | Admit: 2015-02-07 | Discharge: 2015-02-08 | Disposition: A | Discharge status: Home / Self Care | Payer: 59 | Attending: Emergency Medicine | Admitting: Emergency Medicine

## 2015-02-07 ENCOUNTER — Encounter (HOSPITAL_COMMUNITY): Payer: Self-pay

## 2015-02-07 DIAGNOSIS — Z8709 Personal history of other diseases of the respiratory system: Secondary | ICD-10-CM | POA: Insufficient documentation

## 2015-02-07 DIAGNOSIS — Z79899 Other long term (current) drug therapy: Secondary | ICD-10-CM | POA: Diagnosis not present

## 2015-02-07 DIAGNOSIS — F319 Bipolar disorder, unspecified: Secondary | ICD-10-CM | POA: Diagnosis not present

## 2015-02-07 DIAGNOSIS — M6281 Muscle weakness (generalized): Secondary | ICD-10-CM | POA: Insufficient documentation

## 2015-02-07 DIAGNOSIS — R5383 Other fatigue: Secondary | ICD-10-CM

## 2015-02-07 DIAGNOSIS — Z880 Allergy status to penicillin: Secondary | ICD-10-CM | POA: Insufficient documentation

## 2015-02-07 DIAGNOSIS — R1084 Generalized abdominal pain: Secondary | ICD-10-CM | POA: Diagnosis not present

## 2015-02-07 DIAGNOSIS — R112 Nausea with vomiting, unspecified: Secondary | ICD-10-CM

## 2015-02-07 DIAGNOSIS — G43909 Migraine, unspecified, not intractable, without status migrainosus: Secondary | ICD-10-CM | POA: Diagnosis not present

## 2015-02-07 LAB — CBC WITH DIFFERENTIAL/PLATELET
BASOS ABS: 0 10*3/uL (ref 0.0–0.1)
Basophils Relative: 0 % (ref 0–1)
Eosinophils Absolute: 0.2 10*3/uL (ref 0.0–0.7)
Eosinophils Relative: 2 % (ref 0–5)
HCT: 41.7 % (ref 36.0–46.0)
Hemoglobin: 13.6 g/dL (ref 12.0–15.0)
LYMPHS ABS: 3.3 10*3/uL (ref 0.7–4.0)
LYMPHS PCT: 34 % (ref 12–46)
MCH: 28.2 pg (ref 26.0–34.0)
MCHC: 32.6 g/dL (ref 30.0–36.0)
MCV: 86.5 fL (ref 78.0–100.0)
Monocytes Absolute: 0.5 10*3/uL (ref 0.1–1.0)
Monocytes Relative: 5 % (ref 3–12)
NEUTROS ABS: 5.5 10*3/uL (ref 1.7–7.7)
NEUTROS PCT: 59 % (ref 43–77)
PLATELETS: 280 10*3/uL (ref 150–400)
RBC: 4.82 MIL/uL (ref 3.87–5.11)
RDW: 13.6 % (ref 11.5–15.5)
WBC: 9.5 10*3/uL (ref 4.0–10.5)

## 2015-02-07 LAB — COMPREHENSIVE METABOLIC PANEL
ALT: 27 U/L (ref 14–54)
ANION GAP: 8 (ref 5–15)
AST: 26 U/L (ref 15–41)
Albumin: 4.1 g/dL (ref 3.5–5.0)
Alkaline Phosphatase: 53 U/L (ref 38–126)
BILIRUBIN TOTAL: 0.2 mg/dL — AB (ref 0.3–1.2)
BUN: 14 mg/dL (ref 6–20)
CO2: 24 mmol/L (ref 22–32)
Calcium: 9.5 mg/dL (ref 8.9–10.3)
Chloride: 104 mmol/L (ref 101–111)
Creatinine, Ser: 1.08 mg/dL — ABNORMAL HIGH (ref 0.44–1.00)
GFR calc Af Amer: >60 mL/min (ref >60)
GFR calc non Af Amer: 59 mL/min — ABNORMAL LOW (ref >60)
GLUCOSE: 114 mg/dL — AB (ref 65–99)
Potassium: 3.9 mmol/L (ref 3.5–5.1)
Sodium: 136 mmol/L (ref 135–145)
TOTAL PROTEIN: 7.6 g/dL (ref 6.5–8.1)

## 2015-02-07 LAB — URINALYSIS, ROUTINE W REFLEX MICROSCOPIC
Bilirubin Urine: NEGATIVE
GLUCOSE, UA: NEGATIVE mg/dL
Hgb urine dipstick: NEGATIVE
Ketones, ur: NEGATIVE mg/dL
Leukocytes, UA: NEGATIVE
NITRITE: NEGATIVE
PH: 6 (ref 5.0–8.0)
Protein, ur: NEGATIVE mg/dL
SPECIFIC GRAVITY, URINE: 1.028 (ref 1.005–1.030)
Urobilinogen, UA: 1 mg/dL (ref 0.0–1.0)

## 2015-02-07 MED ORDER — ONDANSETRON 8 MG PO TBDP
8.0000 mg | ORAL_TABLET | Freq: Once | ORAL | Status: AC
  Administered 2015-02-08: 8 mg via ORAL
  Filled 2015-02-07: qty 1

## 2015-02-07 MED ORDER — DICYCLOMINE HCL 10 MG/ML IM SOLN
20.0000 mg | Freq: Once | INTRAMUSCULAR | Status: AC
  Administered 2015-02-08: 20 mg via INTRAMUSCULAR
  Filled 2015-02-07: qty 2

## 2015-02-07 NOTE — ED Notes (Signed)
Pt presents with c/o vomiting that started today. Visitor at bedside also reports that pt has been sleeping all weekend long. Pt denies diarrhea, ambulatory to triage.

## 2015-02-08 MED ORDER — DICYCLOMINE HCL 20 MG PO TABS
20.0000 mg | ORAL_TABLET | Freq: Four times a day (QID) | ORAL | Status: DC | PRN
Start: 2015-02-08 — End: 2017-05-29

## 2015-02-08 MED ORDER — ONDANSETRON 8 MG PO TBDP: 8.0000 mg | ORAL_TABLET | Freq: Three times a day (TID) | ORAL | Status: DC | PRN

## 2015-02-08 NOTE — Discharge Instructions (Signed)
Fatigue °Fatigue is a feeling of tiredness, lack of energy, lack of motivation, or feeling tired all the time. Having enough rest, good nutrition, and reducing stress will normally reduce fatigue. Consult your caregiver if it persists. The nature of your fatigue will help your caregiver to find out its cause. The treatment is based on the cause.  °CAUSES  °There are many causes for fatigue. Most of the time, fatigue can be traced to one or more of your habits or routines. Most causes fit into one or more of three general areas. They are: °Lifestyle problems °· Sleep disturbances. °· Overwork. °· Physical exertion. °· Unhealthy habits. °· Poor eating habits or eating disorders. °· Alcohol and/or drug use . °· Lack of proper nutrition (malnutrition). °Psychological problems °· Stress and/or anxiety problems. °· Depression. °· Grief. °· Boredom. °Medical Problems or Conditions °· Anemia. °· Pregnancy. °· Thyroid gland problems. °· Recovery from major surgery. °· Continuous pain. °· Emphysema or asthma that is not well controlled °· Allergic conditions. °· Diabetes. °· Infections (such as mononucleosis). °· Obesity. °· Sleep disorders, such as sleep apnea. °· Heart failure or other heart-related problems. °· Cancer. °· Kidney disease. °· Liver disease. °· Effects of certain medicines such as antihistamines, cough and cold remedies, prescription pain medicines, heart and blood pressure medicines, drugs used for treatment of cancer, and some antidepressants. °SYMPTOMS  °The symptoms of fatigue include:  °· Lack of energy. °· Lack of drive (motivation). °· Drowsiness. °· Feeling of indifference to the surroundings. °DIAGNOSIS  °The details of how you feel help guide your caregiver in finding out what is causing the fatigue. You will be asked about your present and past health condition. It is important to review all medicines that you take, including prescription and non-prescription items. A thorough exam will be done.  You will be questioned about your feelings, habits, and normal lifestyle. Your caregiver may suggest blood tests, urine tests, or other tests to look for common medical causes of fatigue.  °TREATMENT  °Fatigue is treated by correcting the underlying cause. For example, if you have continuous pain or depression, treating these causes will improve how you feel. Similarly, adjusting the dose of certain medicines will help in reducing fatigue.  °HOME CARE INSTRUCTIONS  °· Try to get the required amount of good sleep every night. °· Eat a healthy and nutritious diet, and drink enough water throughout the day. °· Practice ways of relaxing (including yoga or meditation). °· Exercise regularly. °· Make plans to change situations that cause stress. Act on those plans so that stresses decrease over time. Keep your work and personal routine reasonable. °· Avoid street drugs and minimize use of alcohol. °· Start taking a daily multivitamin after consulting your caregiver. °SEEK MEDICAL CARE IF:  °· You have persistent tiredness, which cannot be accounted for. °· You have fever. °· You have unintentional weight loss. °· You have headaches. °· You have disturbed sleep throughout the night. °· You are feeling sad. °· You have constipation. °· You have dry skin. °· You have gained weight. °· You are taking any new or different medicines that you suspect are causing fatigue. °· You are unable to sleep at night. °· You develop any unusual swelling of your legs or other parts of your body. °SEEK IMMEDIATE MEDICAL CARE IF:  °· You are feeling confused. °· Your vision is blurred. °· You feel faint or pass out. °· You develop severe headache. °· You develop severe abdominal, pelvic, or   back pain.  You develop chest pain, shortness of breath, or an irregular or fast heartbeat.  You are unable to pass a normal amount of urine.  You develop abnormal bleeding such as bleeding from the rectum or you vomit blood.  You have thoughts  about harming yourself or committing suicide.  You are worried that you might harm someone else. MAKE SURE YOU:   Understand these instructions.  Will watch your condition.  Will get help right away if you are not doing well or get worse. Document Released: 06/10/2007 Document Revised: 11/05/2011 Document Reviewed: 12/15/2013 [facility] Patient Information 2015 [place], [place]. This information is not intended to replace advice given to you by your health care provider. Make sure you discuss any questions you have with your health care provider.  Nausea and Vomiting Nausea is a sick feeling that often comes before throwing up (vomiting). Vomiting is a reflex where stomach contents come out of your mouth. Vomiting can cause severe loss of body fluids (dehydration). Children and elderly adults can become dehydrated quickly, especially if they also have diarrhea. Nausea and vomiting are symptoms of a condition or disease. It is important to find the cause of your symptoms. CAUSES   Direct irritation of the stomach lining. This irritation can result from increased acid production (gastroesophageal reflux disease), infection, food poisoning, taking certain medicines (such as nonsteroidal anti-inflammatory drugs), alcohol use, or tobacco use.  Signals from the brain.These signals could be caused by a headache, heat exposure, an inner ear disturbance, increased pressure in the brain from injury, infection, a tumor, or a concussion, pain, emotional stimulus, or metabolic problems.  An obstruction in the gastrointestinal tract (bowel obstruction).  Illnesses such as diabetes, hepatitis, gallbladder problems, appendicitis, kidney problems, cancer, sepsis, atypical symptoms of a heart attack, or eating disorders.  Medical treatments such as chemotherapy and radiation.  Receiving medicine that makes you sleep (general anesthetic) during surgery. DIAGNOSIS Your caregiver may ask for tests to be done  if the problems do not improve after a few days. Tests may also be done if symptoms are severe or if the reason for the nausea and vomiting is not clear. Tests may include:  Urine tests.  Blood tests.  Stool tests.  Cultures (to look for evidence of infection).  X-rays or other imaging studies. Test results can help your caregiver make decisions about treatment or the need for additional tests. TREATMENT You need to stay well hydrated. Drink frequently but in small amounts.You may wish to drink water, sports drinks, clear broth, or eat frozen ice pops or gelatin dessert to help stay hydrated.When you eat, eating slowly may help prevent nausea.There are also some antinausea medicines that may help prevent nausea. HOME CARE INSTRUCTIONS   Take all medicine as directed by your caregiver.  If you do not have an appetite, do not force yourself to eat. However, you must continue to drink fluids.  If you have an appetite, eat a normal diet unless your caregiver tells you differently.  Eat a variety of complex carbohydrates (rice, wheat, potatoes, bread), lean meats, yogurt, fruits, and vegetables.  Avoid high-fat foods because they are more difficult to digest.  Drink enough water and fluids to keep your urine clear or pale yellow.  If you are dehydrated, ask your caregiver for specific rehydration instructions. Signs of dehydration may include:  Severe thirst.  Dry lips and mouth.  Dizziness.  Dark urine.  Decreasing urine frequency and amount.  Confusion.  Rapid breathing or pulse.  SEEK IMMEDIATE MEDICAL CARE IF:   You have blood or brown flecks (like coffee grounds) in your vomit.  You have black or bloody stools.  You have a severe headache or stiff neck.  You are confused.  You have severe abdominal pain.  You have chest pain or trouble breathing.  You do not urinate at least once every 8 hours.  You develop cold or clammy skin.  You continue to vomit for  longer than 24 to 48 hours.  You have a fever. MAKE SURE YOU:   Understand these instructions.  Will watch your condition.  Will get help right away if you are not doing well or get worse. Document Released: 08/13/2005 Document Revised: 11/05/2011 Document Reviewed: 01/10/2011 [facility] Patient Information 2015 [place], [place]. This information is not intended to replace advice given to you by your health care provider. Make sure you discuss any questions you have with your health care provider.

## 2015-02-08 NOTE — ED Provider Notes (Signed)
CSN: [id number]     Arrival date & time 02/07/15  2146 History   First MD Initiated Contact with Patient 02/07/15 2303     Chief Complaint  Patient presents with  . Emesis     (Consider location/radiation/quality/duration/timing/severity/associated sxs/prior Treatment) HPI 49 year old female presents to the emergency department with complaint of fatigue, generalized weakness and vomiting starting this afternoon.  Patient reports that she ate Janine Limbo for lunch.  She has had one episode of vomiting.  She has had nausea since.  She is complaining of diffuse abdominal cramping.  No diarrhea.  No sick contacts, no fever no chills, no back pain.  No urinary symptoms.  No chest pain, no cough.  Visit her at the bedside, is concerned as she herself is under chemotherapy and is concerned for possible infectious etiology.  She is also concerned as the patient has been "lethargic" over the weekend, more than her usual.  Patient has history of hypersomnia and is on Nuvigil. Past Medical History  Diagnosis Date  . Seasonal allergies   . HA (headache)   . Migraine   . Memory loss, short term     [name] 04-03-13   . Hypersomnia, persistent 04/29/2013  . History of borderline personality disorder   . Bipolar disorder    Past Surgical History  Procedure Laterality Date  . Knee surgery Right 96,98,00,02,05,2014  . Wrist surgery Right [phone]  . Abdominal hysterectomy  00  . Nose surgery      [id number]   Family History  Problem Relation Age of Onset  . High blood pressure Maternal Grandfather   . Depression Mother   . Breast cancer Maternal Grandmother   . Diabetes Maternal Grandmother   . High blood pressure Maternal Grandmother   . Breast cancer Sister   . Schizophrenia Brother    History  Substance Use Topics  . Smoking status: Never Smoker   . Smokeless tobacco: Never Used  . Alcohol Use: 6.0 oz/week    12 Standard drinks or equivalent per week     Comment: 1-2 monthly    OB  History    No data available     Review of Systems  See History of Present Illness; otherwise all other systems are reviewed and negative   Allergies  Aspirin; Bee venom; Penicillins; Bactrim; and Sulfa antibiotics  Home Medications   Prior to Admission medications   Medication Sig Start Date End Date Taking? Authorizing Provider  Armodafinil 250 MG tablet Take 250 mg by mouth daily.   Yes Historical Provider, MD  hydrocortisone (ANUSOL-HC) 25 MG suppository Place 1 suppository rectally 2 (two) times daily as needed for hemorrhoids.  02/04/15  Yes Historical Provider, MD  lamoTRIgine (LAMICTAL) 100 MG tablet Take 1 tablet (100 mg total) by mouth daily. 11/23/14  Yes Charlcie Cradle, MD  lurasidone (LATUDA) 40 MG TABS tablet Take 1 tablet (40 mg total) by mouth daily with breakfast. 11/23/14  Yes Charlcie Cradle, MD  nortriptyline (PAMELOR) 25 MG capsule Take 25 mg by mouth at bedtime. 12/31/14  Yes Historical Provider, MD  QUEtiapine (SEROQUEL XR) 300 MG 24 hr tablet Take 1 tablet (300 mg total) by mouth at bedtime. 11/23/14  Yes Charlcie Cradle, MD  SUMAtriptan (IMITREX) 100 MG tablet Take 50-100 mg by mouth as directed. at onset of headache and may repeat once in 2 hours if needed 12/31/14  Yes Historical Provider, MD   BP 132/77 mmHg  Pulse 84  Temp(Src) 98.2 F (36.8 C) (Oral)  Resp 14  SpO2 99% Physical Exam  Constitutional: She is oriented to person, place, and time. She appears well-developed and well-nourished. No distress.  HENT:  Head: Normocephalic and atraumatic.  Right Ear: External ear normal.  Left Ear: External ear normal.  Nose: Nose normal.  Mouth/Throat: Oropharynx is clear and moist.  Eyes: Conjunctivae and EOM are normal. Pupils are equal, round, and reactive to light.  Neck: Normal range of motion. Neck supple. No JVD present. No tracheal deviation present. No thyromegaly present.  Cardiovascular: Normal rate, regular rhythm, normal heart sounds and intact distal  pulses.  Exam reveals no gallop and no friction rub.   No murmur heard. Pulmonary/Chest: Effort normal and breath sounds normal. No stridor. No respiratory distress. She has no wheezes. She has no rales. She exhibits no tenderness.  Abdominal: Soft. Bowel sounds are normal. She exhibits no distension and no mass. There is tenderness (mild diffuse tenderness). There is no rebound and no guarding.  Musculoskeletal: Normal range of motion. She exhibits no edema or tenderness.  Lymphadenopathy:    She has no cervical adenopathy.  Neurological: She is alert and oriented to person, place, and time. She displays normal reflexes. No cranial nerve deficit. She exhibits normal muscle tone. Coordination normal.  Skin: Skin is warm and dry. No rash noted. No erythema. No pallor.  Psychiatric: She has a normal mood and affect. Her behavior is normal. Judgment and thought content normal.  Nursing note and vitals reviewed.   ED Course  Procedures (including critical care time) Labs Review Labs Reviewed  COMPREHENSIVE METABOLIC PANEL - Abnormal; Notable for the following:    Glucose, Bld 114 (*)    Creatinine, Ser 1.08 (*)    Total Bilirubin 0.2 (*)    GFR calc non Af Amer 59 (*)    All other components within normal limits  CBC WITH DIFFERENTIAL/PLATELET  URINALYSIS, ROUTINE W REFLEX MICROSCOPIC (NOT AT [facility])    Imaging Review No results found.   EKG Interpretation None      MDM   Final diagnoses:  Other fatigue  Nausea and vomiting, vomiting of unspecified type    49 year old female with abdominal cramping and vomiting after eating chocolate Bell.  No diarrhea.  Exam is nonfocal.  Patient's visitors concerned about her general fatigue and increased somnolence.  Patient has history of hypersomnolence.  Will refer her back to her primary care doctor and/or neurologist for further evaluation.  Should this continue.  Labwork today is unremarkable.  Plan to treat with Zofran and Bentyl.  By  mouth challenge, and if tolerating can go home.    Linton Flemings, MD 02/08/15 [phone]

## 2015-02-08 NOTE — ED Notes (Signed)
Pt tolerated a pack of gram crackers and 218 ml sprite with ice. Denies nausea no episode of emesis, states she is ready for discharge.

## 2015-03-19 ENCOUNTER — Other Ambulatory Visit (HOSPITAL_COMMUNITY): Payer: Self-pay | Admitting: Psychiatry

## 2015-03-23 NOTE — Telephone Encounter (Signed)
Telephone call with patient to arrange an appointment with Dr. Salem Senate for 03/24/15 as patient has not been seen since 11/23/14 by Dr. Doyne Keel and is now in need of medication refills.  Patient agreed with plan.

## 2015-03-24 ENCOUNTER — Ambulatory Visit (INDEPENDENT_AMBULATORY_CARE_PROVIDER_SITE_OTHER): Payer: 59 | Admitting: Psychiatry

## 2015-03-24 ENCOUNTER — Encounter (HOSPITAL_COMMUNITY): Payer: Self-pay | Admitting: Psychiatry

## 2015-03-24 VITALS — BP 110/74 | HR 100 | Ht 70.0 in | Wt 242.8 lb

## 2015-03-24 DIAGNOSIS — F32A Depression, unspecified: Secondary | ICD-10-CM

## 2015-03-24 DIAGNOSIS — F319 Bipolar disorder, unspecified: Secondary | ICD-10-CM | POA: Diagnosis not present

## 2015-03-24 DIAGNOSIS — F329 Major depressive disorder, single episode, unspecified: Secondary | ICD-10-CM

## 2015-03-24 DIAGNOSIS — F411 Generalized anxiety disorder: Secondary | ICD-10-CM

## 2015-03-24 MED ORDER — QUETIAPINE FUMARATE ER 300 MG PO TB24
300.0000 mg | ORAL_TABLET | Freq: Every day | ORAL | Status: DC
Start: 2015-03-24 — End: 2015-07-07

## 2015-03-24 MED ORDER — LAMOTRIGINE 100 MG PO TABS
100.0000 mg | ORAL_TABLET | Freq: Every day | ORAL | Status: DC
Start: 2015-03-24 — End: 2015-07-07

## 2015-03-24 MED ORDER — LURASIDONE HCL 40 MG PO TABS: 40.0000 mg | ORAL_TABLET | Freq: Every day | ORAL | Status: DC

## 2015-03-24 NOTE — Progress Notes (Signed)
[facility] MD Progress Note  03/24/2015 9:36 AM Robin [name]  MRN:  [id number] Subjective: I'm doing fine  Diagnosis:   Patient Active Problem List   Diagnosis Date Noted  . Bipolar I disorder, most recent episode (or current) unspecified [F31.9] 11/23/2014  . Hypersomnia, persistent [G47.10] 04/29/2013  . Memory loss, short term [R41.3]   . Cognitive decline [R41.89] 04/03/2013  . Other malaise and fatigue [R53.81, R53.83] 04/03/2013  . Depression [F32.9] 04/03/2013   Total Time spent with patient: 30 minutes  HPI       : 49 year old African-American married female mother of one currently living in [place] seen for the first time by Dr. Salem Senate, patient sees Dr. Doyne Keel under regular basis.  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 Patient states that her neurologist discontinued the Adderall and Elavil because of significant dry mouth.   Patient states she has bipolar disorder and describes her manic episodes where she tends to leave her family and shop a lot, does not sleep and this episode can go from 2 days to up to a month. Cannot remember her last manic episode her recent episodes have been more of depression.   Patient states she is doing well and is stable at this time, sleep is good she's able to sleep all night, appetite is good mood has been stable denies feeling hopeless or helplesssuicidal or homicidal ideation no hallucinations or delusions.   Patient does have access to a gun and owns a gun but is locked away. Patient is willing to give it to her wife and she feels unsafe. Patient does contract for safety.  She and her wife are going through couples therapy and she also gets individual therapy from [facility] in [place]. States overall she is doing well and his coping well.      Past Medical History:  Past Medical History  Diagnosis Date  . Seasonal allergies   .  HA (headache)   . Migraine   . Memory loss, short term     [name] 04-03-13   . Hypersomnia, persistent 04/29/2013  . History of borderline personality disorder   . Bipolar disorder     Past Surgical History  Procedure Laterality Date  . Knee surgery Right 96,98,00,02,05,2014  . Wrist surgery Right [phone]  . Abdominal hysterectomy  00  . Nose surgery      [id number],7989   Family History:  Family History  Problem Relation Age of Onset  . High blood pressure Maternal Grandfather   . Depression Mother   . Breast cancer Maternal Grandmother   . Diabetes Maternal Grandmother   . High blood pressure Maternal Grandmother   . Breast cancer Sister   . Schizophrenia Brother    Social History:  History  Alcohol Use  . 6.0 oz/week  . 12 Standard drinks or equivalent per week    Comment: 1-2 monthly      History  Drug Use No    History   Social History  . Marital Status: Married    Spouse Name: [name]  . Number of Children: 2  . Years of Education: AA   Occupational History  .      [employer]   Social History Main Topics  . Smoking status: Never Smoker   . Smokeless tobacco: Never Used  . Alcohol Use: 6.0 oz/week    12 Standard drinks or equivalent per week  Comment: 1-2 monthly   . Drug Use: No  . Sexual Activity: Not on file   Other Topics Concern  . None   Social History Narrative   Pneumonia vaccine 2005   Patient lives at home with wife and 2 sons.    Patient works in [place].    Patient has her AA             Sleep: Good  Appetite:  Good    Musculoskeletal: Strength & Muscle Tone: within normal limits Gait & Station: normal Patient leans: Stand straight   Psychiatric Specialty Exam: Physical Exam  Review of Systems  Constitutional: Negative for fever, chills, weight loss, malaise/fatigue and diaphoresis.  HENT: Negative for ear discharge, ear pain, hearing loss, nosebleeds, sore throat and tinnitus.   Eyes: Negative for  blurred vision, photophobia and pain.  Respiratory: Negative for cough and shortness of breath.   Cardiovascular: Negative for chest pain, palpitations and leg swelling.  Gastrointestinal: Negative for heartburn, nausea, vomiting, abdominal pain, diarrhea and constipation.  Genitourinary: Negative for dysuria, urgency, hematuria and flank pain.  Musculoskeletal: Positive for myalgias. Negative for back pain, joint pain and falls.  Skin: Negative for itching and rash.  Neurological: Positive for headaches. Negative for dizziness, tingling, tremors, sensory change, seizures and weakness.  Endo/Heme/Allergies: Negative for environmental allergies and polydipsia. Does not bruise/bleed easily.  Psychiatric/Behavioral: Positive for depression. The patient is nervous/anxious.     Blood pressure 110/74, pulse 100, height 5\' 10"  (1.778 m), weight 242 lb 12.8 oz (110.133 kg).Body mass index is 34.84 kg/(m^2).  General Appearance: Casual  Eye Contact::  Good  Speech:  Clear and Coherent and Normal Rate  Volume:  Normal  Mood:  Euthymic  Affect:  Appropriate  Thought Process:  Goal Directed, Linear and Logical  Orientation:  Full (Time, Place, and Person)  Thought Content:  WDL  Suicidal Thoughts:  No  Homicidal Thoughts:  No  Memory:  Immediate;   Good Recent;   Good Remote;   Good  Judgement:  Good  Insight:  Good  Psychomotor Activity:  Normal  Concentration:  Good  Recall:  Good  Fund of Knowledge:Good  Language: Good  Akathisia:  No  Handed:  Right  AIMS (if indicated):     Assets:  Communication Skills Desire for Improvement Physical Health Resilience Social Support  ADL's:  Intact  Cognition: WNL  Sleep:        Current Medications: Current Outpatient Prescriptions  Medication Sig Dispense Refill  . Armodafinil 250 MG tablet Take 250 mg by mouth daily.    Marland Kitchen dicyclomine (BENTYL) 20 MG tablet Take 1 tablet (20 mg total) by mouth every 6 (six) hours as needed for spasms (for  abdominal cramping). 20 tablet 0  . hydrocortisone (ANUSOL-HC) 25 MG suppository Place 1 suppository rectally 2 (two) times daily as needed for hemorrhoids.   0  . lamoTRIgine (LAMICTAL) 100 MG tablet Take 1 tablet (100 mg total) by mouth daily. 30 tablet 2  . lurasidone (LATUDA) 40 MG TABS tablet Take 1 tablet (40 mg total) by mouth daily with breakfast. 30 tablet 2  . nortriptyline (PAMELOR) 25 MG capsule Take 25 mg by mouth at bedtime.  5  . ondansetron (ZOFRAN ODT) 8 MG disintegrating tablet Take 1 tablet (8 mg total) by mouth every 8 (eight) hours as needed for nausea or vomiting. 20 tablet 0  . QUEtiapine (SEROQUEL XR) 300 MG 24 hr tablet Take 1 tablet (300 mg total) by mouth at bedtime.  30 tablet 2  . SUMAtriptan (IMITREX) 100 MG tablet Take 50-100 mg by mouth as directed. at onset of headache and may repeat once in 2 hours if needed  11   No current facility-administered medications for this visit.    Lab Results: No results found for this or any previous visit (from the past 48 hour(s)).  Physical Findings: AIMS:  , ,  ,  ,    CIWA:    COWS:     Treatment Plan Summary: Daily contact with patient to assess and evaluate symptoms and progress in treatment and Medication management  Plan of Care: Medication management with supportive therapy. Risks/benefits and SE of the medication discussed. Pt verbalized understanding and verbal consent obtained for treatment. Affirm with the patient that the medications are taken as ordered. Patient expressed understanding of how their medications were to be used.   Confidentiality and exclusions reviewed with pt who verbalized understanding.    Laboratory: CBC, CMP, HbA1c, Lipid panel, TSH, Prolactin level, EKG   Psychotherapy: Therapy: brief supportive therapy provided. Discussed psychosocial stressors in detail.    Medications: Continue Lamictal 100mg  po qD for mood Latuda 40mg  po qD for mood Seroquel XR 300mg  po qHS for  mood  Pt reports she has been on this combination for over 2 yrs and is stable. Pt appears to be in need of treatment with 2 antipsychotics as she denies manic episodes on this combination. Benefit outweighs the risk and pt denies SE.   Routine PRN Medications: No  Consultations: encouraged to continue therapy with Gevena Barre  Safety Concerns: Pt denies SI and is at an acute low risk for suicide.Patient told to call clinic if any problems occur. Patient advised to go to ER if they should develop SI/HI, side effects, or if symptoms worsen. Has crisis numbers to call if needed. Pt verbalized understanding.   Other: F/up in 3 months or sooner if needed          Medical Decision Making:  Established Problem, Stable/Improving (1), Review of Psycho-Social Stressors (1), Review or order clinical lab tests (1), Review and summation of old records (2), Review of Last Therapy Session (1) and Review of Medication Regimen & Side Effects (2)

## 2015-04-14 DIAGNOSIS — Z9889 Other specified postprocedural states: Secondary | ICD-10-CM | POA: Insufficient documentation

## 2015-04-14 DIAGNOSIS — Z86010 Personal history of colonic polyps: Secondary | ICD-10-CM | POA: Insufficient documentation

## 2015-05-05 ENCOUNTER — Other Ambulatory Visit (HOSPITAL_COMMUNITY): Payer: Self-pay | Admitting: Psychiatry

## 2015-06-07 ENCOUNTER — Ambulatory Visit (HOSPITAL_COMMUNITY): Payer: Self-pay | Admitting: Psychiatry

## 2015-06-15 ENCOUNTER — Other Ambulatory Visit (HOSPITAL_COMMUNITY): Payer: Self-pay | Admitting: Psychiatry

## 2015-07-07 ENCOUNTER — Encounter (HOSPITAL_COMMUNITY): Payer: Self-pay | Admitting: Psychiatry

## 2015-07-07 ENCOUNTER — Ambulatory Visit (INDEPENDENT_AMBULATORY_CARE_PROVIDER_SITE_OTHER): Payer: 59 | Admitting: Psychiatry

## 2015-07-07 VITALS — BP 131/85 | HR 94 | Ht 70.0 in | Wt 237.8 lb

## 2015-07-07 DIAGNOSIS — Z79899 Other long term (current) drug therapy: Secondary | ICD-10-CM

## 2015-07-07 DIAGNOSIS — F319 Bipolar disorder, unspecified: Secondary | ICD-10-CM

## 2015-07-07 MED ORDER — LATUDA 40 MG PO TABS: 40.0000 mg | ORAL_TABLET | Freq: Every day | ORAL | Status: DC

## 2015-07-07 MED ORDER — LAMOTRIGINE 100 MG PO TABS: ORAL_TABLET | ORAL | Status: DC

## 2015-07-07 MED ORDER — QUETIAPINE FUMARATE ER 300 MG PO TB24
300.0000 mg | ORAL_TABLET | Freq: Every day | ORAL | Status: DC
Start: 2015-07-07 — End: 2015-10-20

## 2015-07-07 NOTE — Progress Notes (Signed)
Patient ID: Robin [name], female   DOB: 1966/05/26, 49 y.o.   MRN: [id number] [facility] MD Progress Note  07/07/2015 4:01 PM Robin [name]  MRN:  [id number] Subjective: I'm doing fine  Diagnosis:   Patient Active Problem List   Diagnosis Date Noted  . Bipolar I disorder, most recent episode (or current) unspecified [F31.9] 11/23/2014  . Hypersomnia, persistent [G47.10] 04/29/2013  . Memory loss, short term [R41.3]   . Cognitive decline [R41.89] 04/03/2013  . Other malaise and fatigue [R53.81, R53.83] 04/03/2013  . Depression [F32.9] 04/03/2013   Total Time spent with patient: 30 minutes  HPI:  Pt's brother died 6 days ago one day before his birthday. Pt is grieving.   Denies any concerns today.  Things at home are good. Pt is working and is in school. Pt is making straight  A's and lately has been less motivated to do school work.   Pt denies depression. Denies anhedonia, isolation, low motivation, poor hygiene, worthlessness and hopelessness. Denies SI/HI. Denies AVH.   Denies manic and hypomanic symptoms including periods of decreased need for sleep, increased energy, mood lability, impulsivity, FOI, and excessive spending.  Sleep is variable due to sinus infection.  Appetite is good.  Energy is on the low side. Concentration is good.   Pt is taking meds as prescribed and denies SE.       Past Medical History:  Past Medical History  Diagnosis Date  . Seasonal allergies   . HA (headache)   . Migraine   . Memory loss, short term     dr Robin [name] 04-03-13   . Hypersomnia, persistent 04/29/2013  . History of borderline personality disorder   . Bipolar disorder [facility])     Past Surgical History  Procedure Laterality Date  . Knee surgery Right 96,98,00,02,05,2014  . Wrist surgery Right [phone]  . Abdominal hysterectomy  00  . Nose surgery      [id number]   Family History:  Family History  Problem Relation Age of Onset  . High blood pressure Maternal Grandfather   .  Depression Mother   . Breast cancer Maternal Grandmother   . Diabetes Maternal Grandmother   . High blood pressure Maternal Grandmother   . Breast cancer Sister   . Schizophrenia Brother    Social History:  History  Alcohol Use  . 6.0 oz/week  . 12 Standard drinks or equivalent per week    Comment: 1-2 monthly      History  Drug Use No    Social History   Social History  . Marital Status: Married    Spouse Name: Robin [name]  . Number of Children: 2  . Years of Education: AA   Occupational History  .      [employer] [place]   Social History Main Topics  . Smoking status: Never Smoker   . Smokeless tobacco: Never Used  . Alcohol Use: 6.0 oz/week    12 Standard drinks or equivalent per week     Comment: 1-2 monthly   . Drug Use: No  . Sexual Activity: Not Asked   Other Topics Concern  . None   Social History Narrative   Pneumonia vaccine 2005   Patient lives at home with wife and 2 sons.    Patient works in [place].    Patient has her AA             Sleep: Good  Appetite:  Good    Musculoskeletal: Strength & Muscle Tone: within normal limits Gait &  Station: normal Patient leans: Stand straight   Psychiatric Specialty Exam: Physical Exam  Review of Systems  Constitutional: Negative for fever, chills, weight loss, malaise/fatigue and diaphoresis.  HENT: Positive for congestion. Negative for ear discharge, ear pain, hearing loss, nosebleeds, sore throat and tinnitus.   Eyes: Negative for blurred vision, photophobia and pain.  Respiratory: Negative for cough and shortness of breath.   Cardiovascular: Negative for chest pain, palpitations and leg swelling.  Gastrointestinal: Negative for heartburn, nausea, vomiting, abdominal pain, diarrhea and constipation.  Genitourinary: Negative for dysuria, urgency, hematuria and flank pain.  Musculoskeletal: Negative for myalgias, back pain, joint pain and falls.  Skin: Negative for itching and rash.   Neurological: Positive for headaches. Negative for dizziness, tingling, tremors, sensory change, seizures and weakness.  Endo/Heme/Allergies: Negative for environmental allergies and polydipsia. Does not bruise/bleed easily.  Psychiatric/Behavioral: Negative for depression, suicidal ideas, hallucinations and substance abuse. The patient is not nervous/anxious and does not have insomnia.     Blood pressure 131/85, pulse 94, height 5\' 10"  (1.778 m), weight 237 lb 12.8 oz (107.865 kg).Body mass index is 34.12 kg/(m^2).  General Appearance: Casual  Eye Contact::  Good  Speech:  Clear and Coherent and Normal Rate  Volume:  Normal  Mood:  Euthymic  Affect:  Appropriate  Thought Process:  Goal Directed, Linear and Logical  Orientation:  Full (Time, Place, and Person)  Thought Content:  WDL  Suicidal Thoughts:  No  Homicidal Thoughts:  No  Memory:  Immediate;   Good Recent;   Good Remote;   Good  Judgement:  Good  Insight:  Good  Psychomotor Activity:  Normal  Concentration:  Good  Recall:  Good  Fund of Knowledge:Good  Language: Good  Akathisia:  No  Handed:  Right  AIMS (if indicated):     Assets:  Communication Skills Desire for Improvement Physical Health Resilience Social Support  ADL's:  Intact  Cognition: WNL  Sleep:        Current Medications: Current Outpatient Prescriptions  Medication Sig Dispense Refill  . Armodafinil 250 MG tablet Take 250 mg by mouth daily.    Marland Kitchen dicyclomine (BENTYL) 20 MG tablet Take 1 tablet (20 mg total) by mouth every 6 (six) hours as needed for spasms (for abdominal cramping). 20 tablet 0  . hydrocortisone (ANUSOL-HC) 25 MG suppository Place 1 suppository rectally 2 (two) times daily as needed for hemorrhoids.   0  . lamoTRIgine (LAMICTAL) 100 MG tablet Take 1 tablet (100 mg total) by mouth daily. 30 tablet 2  . lamoTRIgine (LAMICTAL) 100 MG tablet TAKE 1 TABLET (100 MG TOTAL) BY MOUTH DAILY. 20 tablet 0  . lurasidone (LATUDA) 40 MG TABS  tablet Take 1 tablet (40 mg total) by mouth daily with breakfast. 30 tablet 2  . nortriptyline (PAMELOR) 25 MG capsule Take 25 mg by mouth at bedtime.  5  . ondansetron (ZOFRAN ODT) 8 MG disintegrating tablet Take 1 tablet (8 mg total) by mouth every 8 (eight) hours as needed for nausea or vomiting. 20 tablet 0  . QUEtiapine (SEROQUEL XR) 300 MG 24 hr tablet Take 1 tablet (300 mg total) by mouth at bedtime. 30 tablet 2  . SUMAtriptan (IMITREX) 100 MG tablet Take 50-100 mg by mouth as directed. at onset of headache and may repeat once in 2 hours if needed  11   No current facility-administered medications for this visit.    Lab Results: No results found for this or any previous visit (from the past  48 hour(s)).  Physical Findings: AIMS:  , ,  ,  ,    CIWA:    COWS:     Treatment Plan Summary: Medication management  AXIS I Bipolar I d/o- current episode unspecified  AXIS II Borderline Personality Dis. by hx           Plan of Care: Medication management with supportive therapy. Risks/benefits and SE of the medication discussed. Pt verbalized understanding and verbal consent obtained for treatment. Affirm with the patient that the medications are taken as ordered. Patient expressed understanding of how their medications were to be used.     Laboratory:02/07/2015 CBC WNL , CMP showed creat 1.08, HbA1c 5.7,   Lipid panel LDL 120, TSH WNL, Prolactin level 11.4  EKG reordered   Psychotherapy: Therapy: brief supportive therapy provided. Discussed psychosocial stressors in detail.    Medications: Continue Lamictal 100mg  po qD for mood Latuda 40mg  po qD for mood Seroquel XR 300mg  po qHS for mood Nortriptyline prescribed by neurologist for HA  Pt reports she has been on this combination for over 2 yrs and is stable. Pt appears to be in need of treatment with 2 antipsychotics as she denies manic episodes on this combination. Benefit outweighs the risk and pt denies SE.    Routine PRN Medications: No  Consultations: encouraged to continue therapy with Gevena Barre  Safety Concerns: Pt denies SI and is at an acute low risk for suicide.Patient told to call clinic if any problems occur. Patient advised to go to ER if they should develop SI/HI, side effects, or if symptoms worsen. Has crisis numbers to call if needed. Pt verbalized understanding.   Other: F/up in 3 months or sooner if needed          Medical Decision Making:  Established Problem, Stable/Improving (1), Review of Psycho-Social Stressors (1), Review or order clinical lab tests (1), Review of Last Therapy Session (1) and Review of Medication Regimen & Side Effects (2)

## 2015-08-10 ENCOUNTER — Inpatient Hospital Stay (HOSPITAL_COMMUNITY): Admission: RE | Admit: 2015-08-10 | Payer: Self-pay | Source: Ambulatory Visit

## 2015-08-11 ENCOUNTER — Ambulatory Visit (HOSPITAL_COMMUNITY)
Admission: RE | Admit: 2015-08-11 | Discharge: 2015-08-11 | Disposition: A | Discharge status: Home / Self Care | Payer: 59 | Source: Ambulatory Visit | Attending: Psychiatry | Admitting: Psychiatry

## 2015-08-11 DIAGNOSIS — Z79899 Other long term (current) drug therapy: Secondary | ICD-10-CM

## 2015-10-06 ENCOUNTER — Ambulatory Visit (HOSPITAL_COMMUNITY): Payer: Self-pay | Admitting: Psychiatry

## 2015-10-10 ENCOUNTER — Other Ambulatory Visit (HOSPITAL_COMMUNITY): Payer: Self-pay | Admitting: Psychiatry

## 2015-10-19 ENCOUNTER — Telehealth (HOSPITAL_COMMUNITY): Payer: Self-pay

## 2015-10-19 DIAGNOSIS — F319 Bipolar disorder, unspecified: Secondary | ICD-10-CM

## 2015-10-19 NOTE — Telephone Encounter (Signed)
Patient calling for a refill on Lamictal, Latuda and Seroquel. She was last here on 11/10 and has a f/u on 3/9. Okay to refill? Please advise, thank you

## 2015-10-20 MED ORDER — QUETIAPINE FUMARATE ER 300 MG PO TB24: 300.0000 mg | ORAL_TABLET | Freq: Every day | ORAL | Status: DC

## 2015-10-20 MED ORDER — LATUDA 40 MG PO TABS: 40.0000 mg | ORAL_TABLET | Freq: Every day | ORAL | Status: DC

## 2015-10-20 MED ORDER — LAMOTRIGINE 100 MG PO TABS: ORAL_TABLET | ORAL | Status: DC

## 2015-10-20 NOTE — Telephone Encounter (Signed)
Sent in all three prescriptions, Lamictal, Latuna, and Seroquel to [facility], a 14 day supply and called patient to let her know

## 2015-10-20 NOTE — Telephone Encounter (Signed)
Yes can refill with enough tabs to get to scheduled appt on 11/03/2015

## 2015-11-03 ENCOUNTER — Encounter (HOSPITAL_COMMUNITY): Payer: Self-pay | Admitting: Psychiatry

## 2015-11-03 ENCOUNTER — Ambulatory Visit (INDEPENDENT_AMBULATORY_CARE_PROVIDER_SITE_OTHER): Payer: 59 | Admitting: Psychiatry

## 2015-11-03 VITALS — BP 136/84 | HR 90 | Ht 69.0 in | Wt 239.0 lb

## 2015-11-03 DIAGNOSIS — F319 Bipolar disorder, unspecified: Secondary | ICD-10-CM | POA: Diagnosis not present

## 2015-11-03 MED ORDER — LATUDA 40 MG PO TABS: 40.0000 mg | ORAL_TABLET | Freq: Every day | ORAL | Status: DC

## 2015-11-03 MED ORDER — QUETIAPINE FUMARATE ER 300 MG PO TB24: 300.0000 mg | ORAL_TABLET | Freq: Every day | ORAL | Status: DC

## 2015-11-03 MED ORDER — LAMOTRIGINE 100 MG PO TABS: ORAL_TABLET | ORAL | Status: DC

## 2015-11-03 NOTE — Progress Notes (Signed)
[facility] MD Progress Note  11/03/2015 9:16 AM Robin [name]  MRN:  [id number] Subjective: good  Diagnosis:   Patient Active Problem List   Diagnosis Date Noted  . Bipolar I disorder, most recent episode (or current) unspecified [F31.9] 11/23/2014  . Hypersomnia, persistent [G47.10] 04/29/2013  . Memory loss, short term [R41.3]   . Cognitive decline [R41.89] 04/03/2013  . Other malaise and fatigue [R53.81, R53.83] 04/03/2013  . Depression [F32.9] 04/03/2013     HPI:  Denies any concerns today.  Things at home are good. Pt is working and is in school. Pt is making straight  A's and lately has been less motivated to do school work.   Pt denies depression. Denies anhedonia, isolation, low motivation, poor hygiene, worthlessness and hopelessness. Denies SI/HI. Denies AVH.   Denies manic and hypomanic symptoms including periods of decreased need for sleep, increased energy, mood lability, impulsivity, FOI, and excessive spending.  Sleep is variable due to sinus infection.  Appetite is good.  Energy is on the low side. Concentration is good.   Pt is taking meds as prescribed and denies SE.       Past Medical History:  Past Medical History  Diagnosis Date  . Seasonal allergies   . HA (headache)   . Migraine   . Memory loss, short term     dr Robin [name] 04-03-13   . Hypersomnia, persistent 04/29/2013  . History of borderline personality disorder   . Bipolar disorder [name])     Past Surgical History  Procedure Laterality Date  . Knee surgery Right 96,98,00,02,05,2014  . Wrist surgery Right [phone]  . Abdominal hysterectomy  00  . Nose surgery      [id number]   Family History:  Family History  Problem Relation Age of Onset  . High blood pressure Maternal Grandfather   . Depression Mother   . Breast cancer Maternal Grandmother   . Diabetes Maternal Grandmother   . High blood pressure Maternal Grandmother   . Breast cancer Sister   . Schizophrenia Brother    Social History:   History  Alcohol Use  . 6.0 oz/week  . 12 Standard drinks or equivalent per week    Comment: 1-2 monthly      History  Drug Use No    Social History   Social History  . Marital Status: Married    Spouse Name: Robin [name]  . Number of Children: 2  . Years of Education: AA   Occupational History  .      [place]    Social History Main Topics  . Smoking status: Never Smoker   . Smokeless tobacco: Never Used  . Alcohol Use: 6.0 oz/week    12 Standard drinks or equivalent per week     Comment: 1-2 monthly   . Drug Use: No  . Sexual Activity: Not Asked   Other Topics Concern  . None   Social History Narrative   Pneumonia vaccine 2005   Patient lives at home with wife and 2 sons.    Patient works in [place].    Patient has her AA             Sleep: Good  Appetite:  Good    Musculoskeletal: Strength & Muscle Tone: within normal limits Gait & Station: normal Patient leans: Stand straight   Psychiatric Specialty Exam: Physical Exam  Review of Systems  Constitutional: Negative for fever, chills, weight loss, malaise/fatigue and diaphoresis.  HENT: Positive for congestion. Negative for ear discharge, ear pain,  hearing loss, nosebleeds, sore throat and tinnitus.   Eyes: Negative for blurred vision, photophobia and pain.  Respiratory: Positive for cough. Negative for shortness of breath.   Cardiovascular: Negative for chest pain, palpitations and leg swelling.  Gastrointestinal: Negative for heartburn, nausea, vomiting, abdominal pain, diarrhea and constipation.  Genitourinary: Negative for dysuria, urgency, hematuria and flank pain.  Musculoskeletal: Negative for myalgias, back pain, joint pain and falls.  Skin: Negative for itching and rash.  Neurological: Positive for headaches. Negative for dizziness, tingling, tremors, sensory change, seizures and weakness.  Endo/Heme/Allergies: Positive for environmental allergies. Negative for polydipsia. Does  not bruise/bleed easily.  Psychiatric/Behavioral: Negative for depression, suicidal ideas, hallucinations and substance abuse. The patient is not nervous/anxious and does not have insomnia.     Blood pressure 136/84, pulse 90, height 5\' 9"  (1.753 m), weight 239 lb (108.41 kg).Body mass index is 35.28 kg/(m^2).  General Appearance: Casual  Eye Contact::  Good  Speech:  Clear and Coherent and Normal Rate  Volume:  Normal  Mood:  Euthymic  Affect:  Appropriate  Thought Process:  Goal Directed, Linear and Logical  Orientation:  Full (Time, Place, and Person)  Thought Content:  WDL  Suicidal Thoughts:  No  Homicidal Thoughts:  No  Memory:  Immediate;   Good Recent;   Good Remote;   Good  Judgement:  Good  Insight:  Good  Psychomotor Activity:  Normal  Concentration:  Good  Recall:  Good  Fund of Knowledge:Good  Language: Good  Akathisia:  No  Handed:  Right  AIMS (if indicated):     Assets:  Communication Skills Desire for Improvement Physical Health Resilience Social Support  ADL's:  Intact  Cognition: WNL  Sleep:        Current Medications: Current Outpatient Prescriptions  Medication Sig Dispense Refill  . lamoTRIgine (LAMICTAL) 100 MG tablet TAKE 1 TABLET (100 MG TOTAL) BY MOUTH DAILY. 14 tablet 0  . LATUDA 40 MG TABS tablet Take 1 tablet (40 mg total) by mouth daily with breakfast. 14 tablet 0  . nortriptyline (PAMELOR) 25 MG capsule Take 25 mg by mouth at bedtime.  5  . ondansetron (ZOFRAN ODT) 8 MG disintegrating tablet Take 1 tablet (8 mg total) by mouth every 8 (eight) hours as needed for nausea or vomiting. 20 tablet 0  . QUEtiapine (SEROQUEL XR) 300 MG 24 hr tablet Take 1 tablet (300 mg total) by mouth at bedtime. 14 tablet 0  . SUMAtriptan (IMITREX) 100 MG tablet Take 50-100 mg by mouth as directed. at onset of headache and may repeat once in 2 hours if needed  11  . Armodafinil 250 MG tablet Take 250 mg by mouth daily. Reported on 11/03/2015    . dicyclomine  (BENTYL) 20 MG tablet Take 1 tablet (20 mg total) by mouth every 6 (six) hours as needed for spasms (for abdominal cramping). (Patient not taking: Reported on 11/03/2015) 20 tablet 0  . hydrocortisone (ANUSOL-HC) 25 MG suppository Place 1 suppository rectally 2 (two) times daily as needed for hemorrhoids. Reported on 11/03/2015  0   No current facility-administered medications for this visit.    Lab Results: No results found for this or any previous visit (from the past 48 hour(s)).   Treatment Plan Summary: Medication management  AXIS I Bipolar I d/o- current episode unspecified  AXIS II Borderline Personality Dis. by hx           Plan of Care: Medication management with supportive therapy. Risks/benefits and SE of  the medication discussed. Pt verbalized understanding and verbal consent obtained for treatment. Affirm with the patient that the medications are taken as ordered. Patient expressed understanding of how their medications were to be used.     Laboratory:02/07/2015 CBC WNL , CMP showed creat 1.08, HbA1c 5.7,   Lipid panel LDL 120, TSH WNL, Prolactin level 11.4  EKG 08/11/2015 QTc 439, NSR   Psychotherapy: Therapy: brief supportive therapy provided. Discussed psychosocial stressors in detail.    Medications: Continue Lamictal 100mg  po qD for mood Latuda 40mg  po qD for mood Seroquel XR 300mg  po qHS for mood Nortriptyline prescribed by neurologist for HA  Pt reports she has been on this combination for over 2 yrs and is stable. Pt appears to be in need of treatment with 2 antipsychotics as she denies manic episodes on this combination. Benefit outweighs the risk and pt denies SE.   Routine PRN Medications: No  Consultations: encouraged to continue therapy with Gevena Barre  Safety Concerns: Pt denies SI and is at an acute low risk for suicide.Patient told to call clinic if any problems occur. Patient advised to go to ER if they should develop SI/HI, side  effects, or if symptoms worsen. Has crisis numbers to call if needed. Pt verbalized understanding.   Other: F/up in 3 months or sooner if needed          Medical Decision Making:  Established Problem, Stable/Improving (1), Review of Psycho-Social Stressors (1), Review or order clinical lab tests (1), Review of Last Therapy Session (1) and Review of Medication Regimen & Side Effects (2)

## 2016-01-27 DIAGNOSIS — J324 Chronic pansinusitis: Secondary | ICD-10-CM | POA: Insufficient documentation

## 2016-01-27 DIAGNOSIS — J343 Hypertrophy of nasal turbinates: Secondary | ICD-10-CM | POA: Insufficient documentation

## 2016-01-31 ENCOUNTER — Encounter (HOSPITAL_COMMUNITY): Payer: Self-pay | Admitting: Psychiatry

## 2016-01-31 ENCOUNTER — Ambulatory Visit (INDEPENDENT_AMBULATORY_CARE_PROVIDER_SITE_OTHER): Payer: 59 | Admitting: Psychiatry

## 2016-01-31 VITALS — BP 138/86 | HR 94 | Ht 70.0 in | Wt 240.0 lb

## 2016-01-31 DIAGNOSIS — F319 Bipolar disorder, unspecified: Secondary | ICD-10-CM

## 2016-01-31 MED ORDER — LAMOTRIGINE 100 MG PO TABS: ORAL_TABLET | ORAL | Status: DC

## 2016-01-31 MED ORDER — QUETIAPINE FUMARATE ER 300 MG PO TB24: 300.0000 mg | ORAL_TABLET | Freq: Every day | ORAL | Status: DC

## 2016-01-31 MED ORDER — LATUDA 40 MG PO TABS
40.0000 mg | ORAL_TABLET | Freq: Every day | ORAL | Status: DC
Start: 2016-01-31 — End: 2016-04-05

## 2016-01-31 NOTE — Progress Notes (Signed)
Patient ID: Robin [name], female   DOB: April 03, 1966, 50 y.o.   MRN: [id number] [facility] MD Progress Note  01/31/2016 10:07 AM Robin [name]  MRN:  [id number] Subjective:  "I am doing really well"  Diagnosis:   Patient Active Problem List   Diagnosis Date Noted  . Bipolar I disorder, most recent episode (or current) unspecified [F31.9] 11/23/2014  . Hypersomnia, persistent [G47.10] 04/29/2013  . Memory loss, short term [R41.3]   . Cognitive decline [R41.89] 04/03/2013  . Other malaise and fatigue [R53.81, R53.83] 04/03/2013  . Depression [F32.9] 04/03/2013     HPI:  Denies any concerns today. Here with wife.   Pt had sinus surgery 3 weeks ago and is recovering.   Things at home are good. Pt is working. Will start at [employer] in the fall.   Pt denies depression. Denies anhedonia, isolation, low motivation, poor hygiene, worthlessness and hopelessness. Denies SI/HI. Denies AVH.   Denies manic and hypomanic symptoms including periods of decreased need for sleep, increased energy, mood lability, impulsivity, FOI, and excessive spending.  Sleep is variable due to recent surgery.  Appetite is good.  Energy is on the low side. Concentration is good.   Pt is taking meds as prescribed and denies SE.       Past Medical History:  Past Medical History  Diagnosis Date  . Seasonal allergies   . HA (headache)   . Migraine   . Memory loss, short term     [name] 04-03-13   . Hypersomnia, persistent 04/29/2013  . History of borderline personality disorder   . Bipolar disorder [facility])     Past Surgical History  Procedure Laterality Date  . Knee surgery Right 96,98,00,02,05,2014  . Wrist surgery Right [phone]  . Abdominal hysterectomy  00  . Nose surgery      [id number]  . Nasal sinus surgery     Family History:  Family History  Problem Relation Age of Onset  . High blood pressure Maternal Grandfather   . Depression Mother   . Breast cancer Maternal Grandmother   . Diabetes Maternal  Grandmother   . High blood pressure Maternal Grandmother   . Breast cancer Sister   . Schizophrenia Brother    Social History:  History  Alcohol Use  . 6.0 oz/week  . 12 Standard drinks or equivalent per week    Comment: 1-2 monthly      History  Drug Use No    Social History   Social History  . Marital Status: Married    Spouse Name: [name]  . Number of Children: 2  . Years of Education: AA   Occupational History  .      [employer]   Social History Main Topics  . Smoking status: Never Smoker   . Smokeless tobacco: Never Used  . Alcohol Use: 6.0 oz/week    12 Standard drinks or equivalent per week     Comment: 1-2 monthly   . Drug Use: No  . Sexual Activity: Not Asked   Other Topics Concern  . None   Social History Narrative   Pneumonia vaccine 2005   Patient lives at home with wife and 2 sons.    Patient works in [employer].    Patient has her AA             Sleep: Poor  Appetite:  Good    Musculoskeletal: Strength & Muscle Tone: within normal limits Gait & Station: normal Patient leans: Stand straight   Psychiatric  Specialty Exam: Physical Exam  Review of Systems  Constitutional: Negative for fever, chills, weight loss, malaise/fatigue and diaphoresis.  HENT: Positive for congestion. Negative for ear discharge, ear pain, hearing loss, nosebleeds, sore throat and tinnitus.   Eyes: Negative for blurred vision, photophobia and pain.  Respiratory: Negative for cough and shortness of breath.   Cardiovascular: Negative for chest pain, palpitations and leg swelling.  Gastrointestinal: Negative for heartburn, nausea, vomiting, abdominal pain, diarrhea and constipation.  Genitourinary: Negative for dysuria, urgency, hematuria and flank pain.  Musculoskeletal: Positive for myalgias. Negative for back pain, joint pain and falls.  Skin: Negative for itching and rash.  Neurological: Negative for dizziness, tingling, tremors, sensory change,  seizures, weakness and headaches.  Endo/Heme/Allergies: Negative for environmental allergies and polydipsia. Does not bruise/bleed easily.  Psychiatric/Behavioral: Negative for depression, suicidal ideas, hallucinations and substance abuse. The patient is not nervous/anxious and does not have insomnia.     Blood pressure 138/86, pulse 94, height 5\' 10"  (1.778 m), weight 240 lb (108.863 kg).Body mass index is 34.44 kg/(m^2).  General Appearance: Casual  Eye Contact::  Good  Speech:  Clear and Coherent and Normal Rate  Volume:  Normal  Mood:  Euthymic  Affect:  Appropriate  Thought Process:  Goal Directed, Linear and Logical  Orientation:  Full (Time, Place, and Person)  Thought Content:  WDL  Suicidal Thoughts:  No  Homicidal Thoughts:  No  Memory:  Immediate;   Good Recent;   Good Remote;   Good  Judgement:  Good  Insight:  Good  Psychomotor Activity:  Normal  Concentration:  Good  Recall:  Good  Fund of Knowledge:Good  Language: Good  Akathisia:  No  Handed:  Right  AIMS (if indicated):     Assets:  Communication Skills Desire for Improvement Physical Health Resilience Social Support  ADL's:  Intact  Cognition: WNL  Sleep:        Current Medications: Current Outpatient Prescriptions  Medication Sig Dispense Refill  . dicyclomine (BENTYL) 20 MG tablet Take 1 tablet (20 mg total) by mouth every 6 (six) hours as needed for spasms (for abdominal cramping). 20 tablet 0  . hydrocortisone (ANUSOL-HC) 25 MG suppository Place 1 suppository rectally 2 (two) times daily as needed for hemorrhoids. Reported on 11/03/2015  0  . lamoTRIgine (LAMICTAL) 100 MG tablet TAKE 1 TABLET (100 MG TOTAL) BY MOUTH DAILY. 90 tablet 0  . LATUDA 40 MG TABS tablet Take 1 tablet (40 mg total) by mouth daily with breakfast. 90 tablet 0  . nortriptyline (PAMELOR) 25 MG capsule Take 25 mg by mouth at bedtime.  5  . ondansetron (ZOFRAN ODT) 8 MG disintegrating tablet Take 1 tablet (8 mg total) by mouth  every 8 (eight) hours as needed for nausea or vomiting. 20 tablet 0  . QUEtiapine (SEROQUEL XR) 300 MG 24 hr tablet Take 1 tablet (300 mg total) by mouth at bedtime. 90 tablet 0  . SUMAtriptan (IMITREX) 100 MG tablet Take 50-100 mg by mouth as directed. at onset of headache and may repeat once in 2 hours if needed  11  . Armodafinil 250 MG tablet Take 250 mg by mouth daily. Reported on 01/31/2016     No current facility-administered medications for this visit.    Lab Results: No results found for this or any previous visit (from the past 48 hour(s)).   Treatment Plan Summary: Medication management  AXIS I Bipolar I d/o- current episode unspecified  AXIS II Borderline Personality Dis. by hx  Plan of Care: Medication management with supportive therapy. Risks/benefits and SE of the medication discussed. Pt verbalized understanding and verbal consent obtained for treatment. Affirm with the patient that the medications are taken as ordered. Patient expressed understanding of how their medications were to be used.     Laboratory:02/07/2015 CBC WNL , CMP showed creat 1.08, HbA1c 5.7,   Lipid panel LDL 120, TSH WNL, Prolactin level 11.4  EKG 08/11/2015 QTc 439, NSR   Psychotherapy: Therapy: brief supportive therapy provided. Discussed psychosocial stressors in detail.    Medications: Continue Lamictal 100mg  po qD for mood Latuda 40mg  po qD for mood Seroquel XR 300mg  po qHS for mood Nortriptyline prescribed by neurologist for HA  Pt reports she has been on this combination for over 2 yrs and is stable. Pt appears to be in need of treatment with 2 antipsychotics as she denies manic episodes on this combination. Benefit outweighs the risk and pt denies SE.   Routine PRN Medications: No  Consultations: encouraged to continue therapy with Gevena Barre  Safety Concerns: Pt denies SI and is at an acute low risk for suicide.Patient told to call clinic if any problems  occur. Patient advised to go to ER if they should develop SI/HI, side effects, or if symptoms worsen. Has crisis numbers to call if needed. Pt verbalized understanding.   Other: F/up in 4 months or sooner if needed          Medical Decision Making:  Established Problem, Stable/Improving (1), Review of Psycho-Social Stressors (1), Review of Last Therapy Session (1) and Review of Medication Regimen & Side Effects (2)

## 2016-03-13 ENCOUNTER — Emergency Department (HOSPITAL_COMMUNITY): Payer: 59

## 2016-03-13 ENCOUNTER — Encounter (HOSPITAL_COMMUNITY): Payer: Self-pay | Admitting: Emergency Medicine

## 2016-03-13 ENCOUNTER — Emergency Department (HOSPITAL_COMMUNITY)
Admission: EM | Admit: 2016-03-13 | Discharge: 2016-03-13 | Disposition: A | Discharge status: Home / Self Care | Payer: 59 | Attending: Emergency Medicine | Admitting: Emergency Medicine

## 2016-03-13 DIAGNOSIS — R1110 Vomiting, unspecified: Secondary | ICD-10-CM | POA: Diagnosis not present

## 2016-03-13 DIAGNOSIS — F319 Bipolar disorder, unspecified: Secondary | ICD-10-CM | POA: Diagnosis not present

## 2016-03-13 DIAGNOSIS — R072 Precordial pain: Secondary | ICD-10-CM | POA: Insufficient documentation

## 2016-03-13 DIAGNOSIS — R079 Chest pain, unspecified: Secondary | ICD-10-CM | POA: Diagnosis present

## 2016-03-13 DIAGNOSIS — R109 Unspecified abdominal pain: Secondary | ICD-10-CM | POA: Insufficient documentation

## 2016-03-13 LAB — COMPREHENSIVE METABOLIC PANEL
ALT: 27 U/L (ref 14–54)
AST: 22 U/L (ref 15–41)
Albumin: 4.2 g/dL (ref 3.5–5.0)
Alkaline Phosphatase: 58 U/L (ref 38–126)
Anion gap: 7 (ref 5–15)
BUN: 8 mg/dL (ref 6–20)
CHLORIDE: 105 mmol/L (ref 101–111)
CO2: 26 mmol/L (ref 22–32)
CREATININE: 0.86 mg/dL (ref 0.44–1.00)
Calcium: 9 mg/dL (ref 8.9–10.3)
Glucose, Bld: 100 mg/dL — ABNORMAL HIGH (ref 65–99)
POTASSIUM: 3.6 mmol/L (ref 3.5–5.1)
SODIUM: 138 mmol/L (ref 135–145)
Total Bilirubin: 0.6 mg/dL (ref 0.3–1.2)
Total Protein: 7.5 g/dL (ref 6.5–8.1)

## 2016-03-13 LAB — I-STAT TROPONIN, ED: Troponin i, poc: 0 ng/mL (ref 0.00–0.08)

## 2016-03-13 LAB — CBC
HEMATOCRIT: 40.9 % (ref 36.0–46.0)
Hemoglobin: 12.7 g/dL (ref 12.0–15.0)
MCH: 27.5 pg (ref 26.0–34.0)
MCHC: 31.1 g/dL (ref 30.0–36.0)
MCV: 88.7 fL (ref 78.0–100.0)
Platelets: 268 10*3/uL (ref 150–400)
RBC: 4.61 MIL/uL (ref 3.87–5.11)
RDW: 13.5 % (ref 11.5–15.5)
WBC: 7.6 10*3/uL (ref 4.0–10.5)

## 2016-03-13 LAB — LIPASE, BLOOD: LIPASE: 15 U/L (ref 11–51)

## 2016-03-13 MED ORDER — ACETAMINOPHEN 325 MG PO TABS
650.0000 mg | ORAL_TABLET | Freq: Once | ORAL | Status: AC
  Administered 2016-03-13: 650 mg via ORAL
  Filled 2016-03-13: qty 2

## 2016-03-13 NOTE — Discharge Instructions (Signed)
It was our pleasure to provide your ER care today - we hope that you feel better.  Rest. Drink plenty of fluids.  Take tylenol/advil as need.  Your lab and xray results are good/normal, which is very reassuring.  For your symptoms, we recommend close follow up with your primary care doctor in the coming week.   Return to ER if worse, new symptoms, fevers, trouble breathing, or other concern.      Nonspecific Chest Pain  Chest pain can be caused by many different conditions. There is always a chance that your pain could be related to something serious, such as a heart attack or a blood clot in your lungs. Chest pain can also be caused by conditions that are not life-threatening. If you have chest pain, it is very important to follow up with your health care provider. CAUSES  Chest pain can be caused by:  Heartburn.  Pneumonia or bronchitis.  Anxiety or stress.  Inflammation around your heart (pericarditis) or lung (pleuritis or pleurisy).  A blood clot in your lung.  A collapsed lung (pneumothorax). It can develop suddenly on its own (spontaneous pneumothorax) or from trauma to the chest.  Shingles infection (varicella-zoster virus).  Heart attack.  Damage to the bones, muscles, and cartilage that make up your chest wall. This can include:  Bruised bones due to injury.  Strained muscles or cartilage due to frequent or repeated coughing or overwork.  Fracture to one or more ribs.  Sore cartilage due to inflammation (costochondritis). RISK FACTORS  Risk factors for chest pain may include:  Activities that increase your risk for trauma or injury to your chest.  Respiratory infections or conditions that cause frequent coughing.  Medical conditions or overeating that can cause heartburn.  Heart disease or family history of heart disease.  Conditions or health behaviors that increase your risk of developing a blood clot.  Having had chicken pox (varicella  zoster). SIGNS AND SYMPTOMS Chest pain can feel like:  Burning or tingling on the surface of your chest or deep in your chest.  Crushing, pressure, aching, or squeezing pain.  Dull or sharp pain that is worse when you move, cough, or take a deep breath.  Pain that is also felt in your back, neck, shoulder, or arm, or pain that spreads to any of these areas. Your chest pain may come and go, or it may stay constant. DIAGNOSIS Lab tests or other studies may be needed to find the cause of your pain. Your health care provider may have you take a test called an ambulatory ECG (electrocardiogram). An ECG records your heartbeat patterns at the time the test is performed. You may also have other tests, such as:  Transthoracic echocardiogram (TTE). During echocardiography, sound waves are used to create a picture of all of the heart structures and to look at how blood flows through your heart.  Transesophageal echocardiogram (TEE).This is a more advanced imaging test that obtains images from inside your body. It allows your health care provider to see your heart in finer detail.  Cardiac monitoring. This allows your health care provider to monitor your heart rate and rhythm in real time.  Holter monitor. This is a portable device that records your heartbeat and can help to diagnose abnormal heartbeats. It allows your health care provider to track your heart activity for several days, if needed.  Stress tests. These can be done through exercise or by taking medicine that makes your heart beat more quickly.  Blood tests.  Imaging tests. TREATMENT  Your treatment depends on what is causing your chest pain. Treatment may include:  Medicines. These may include:  Acid blockers for heartburn.  Anti-inflammatory medicine.  Pain medicine for inflammatory conditions.  Antibiotic medicine, if an infection is present.  Medicines to dissolve blood clots.  Medicines to treat coronary artery  disease.  Supportive care for conditions that do not require medicines. This may include:  Resting.  Applying heat or cold packs to injured areas.  Limiting activities until pain decreases. HOME CARE INSTRUCTIONS  If you were prescribed an antibiotic medicine, finish it all even if you start to feel better.  Avoid any activities that bring on chest pain.  Do not use any tobacco products, including cigarettes, chewing tobacco, or electronic cigarettes. If you need help quitting, ask your health care provider.  Do not drink alcohol.  Take medicines only as directed by your health care provider.  Keep all follow-up visits as directed by your health care provider. This is important. This includes any further testing if your chest pain does not go away.  If heartburn is the cause for your chest pain, you may be told to keep your head raised (elevated) while sleeping. This reduces the chance that acid will go from your stomach into your esophagus.  Make lifestyle changes as directed by your health care provider. These may include:  Getting regular exercise. Ask your health care provider to suggest some activities that are safe for you.  Eating a heart-healthy diet. A registered dietitian can help you to learn healthy eating options.  Maintaining a healthy weight.  Managing diabetes, if necessary.  Reducing stress. SEEK MEDICAL CARE IF:  Your chest pain does not go away after treatment.  You have a rash with blisters on your chest.  You have a fever. SEEK IMMEDIATE MEDICAL CARE IF:   Your chest pain is worse.  You have an increasing cough, or you cough up blood.  You have severe abdominal pain.  You have severe weakness.  You faint.  You have chills.  You have sudden, unexplained chest discomfort.  You have sudden, unexplained discomfort in your arms, back, neck, or jaw.  You have shortness of breath at any time.  You suddenly start to sweat, or your skin gets  clammy.  You feel nauseous or you vomit.  You suddenly feel light-headed or dizzy.  Your heart begins to beat quickly, or it feels like it is skipping beats. These symptoms may represent a serious problem that is an emergency. Do not wait to see if the symptoms will go away. Get medical help right away. Call your local emergency services (911 in the [place] Do not drive yourself to the hospital.   This information is not intended to replace advice given to you by your health care provider. Make sure you discuss any questions you have with your health care provider.   Document Released: 05/23/2005 Document Revised: 09/03/2014 Document Reviewed: 03/19/2014 Elsevier Interactive Patient Education 2016 [employer]   Chest Wall Pain Chest wall pain is pain in or around the bones and muscles of your chest. Sometimes, an injury causes this pain. Sometimes, the cause may not be known. This pain may take several weeks or longer to get better. HOME CARE INSTRUCTIONS  Pay attention to any changes in your symptoms. Take these actions to help with your pain:   Rest as told by your health care provider.   Avoid activities that cause pain. These  include any activities that use your chest muscles or your abdominal and side muscles to lift heavy items.   If directed, apply ice to the painful area:  Put ice in a plastic bag.  Place a towel between your skin and the bag.  Leave the ice on for 20 minutes, 2-3 times per day.  Take over-the-counter and prescription medicines only as told by your health care provider.  Do not use tobacco products, including cigarettes, chewing tobacco, and e-cigarettes. If you need help quitting, ask your health care provider.  Keep all follow-up visits as told by your health care provider. This is important. SEEK MEDICAL CARE IF:  You have a fever.  Your chest pain becomes worse.  You have new symptoms. SEEK IMMEDIATE MEDICAL CARE IF:  You have nausea or  vomiting.  You feel sweaty or light-headed.  You have a cough with phlegm (sputum) or you cough up blood.  You develop shortness of breath.   This information is not intended to replace advice given to you by your health care provider. Make sure you discuss any questions you have with your health care provider.   Document Released: 08/13/2005 Document Revised: 05/04/2015 Document Reviewed: 11/08/2014 Elsevier Interactive Patient Education 2016 [employer] Inc.     Abdominal Pain, Adult Many things can cause abdominal pain. Usually, abdominal pain is not caused by a disease and will improve without treatment. It can often be observed and treated at home. Your health care provider will do a physical exam and possibly order blood tests and X-rays to help determine the seriousness of your pain. However, in many cases, more time must pass before a clear cause of the pain can be found. Before that point, your health care provider may not know if you need more testing or further treatment. HOME CARE INSTRUCTIONS Monitor your abdominal pain for any changes. The following actions may help to alleviate any discomfort you are experiencing:  Only take over-the-counter or prescription medicines as directed by your health care provider.  Do not take laxatives unless directed to do so by your health care provider.  Try a clear liquid diet (broth, tea, or water) as directed by your health care provider. Slowly move to a bland diet as tolerated. SEEK MEDICAL CARE IF:  You have unexplained abdominal pain.  You have abdominal pain associated with nausea or diarrhea.  You have pain when you urinate or have a bowel movement.  You experience abdominal pain that wakes you in the night.  You have abdominal pain that is worsened or improved by eating food.  You have abdominal pain that is worsened with eating fatty foods.  You have a fever. SEEK IMMEDIATE MEDICAL CARE IF:  Your pain does not go away  within 2 hours.  You keep throwing up (vomiting).  Your pain is felt only in portions of the abdomen, such as the right side or the left lower portion of the abdomen.  You pass bloody or black tarry stools. MAKE SURE YOU:  Understand these instructions.  Will watch your condition.  Will get help right away if you are not doing well or get worse.   This information is not intended to replace advice given to you by your health care provider. Make sure you discuss any questions you have with your health care provider.   Document Released: 05/23/2005 Document Revised: 05/04/2015 Document Reviewed: 04/22/2013 Elsevier Interactive Patient Education [employer].

## 2016-03-13 NOTE — ED Provider Notes (Signed)
CSN: [id number]     Arrival date & time 03/13/16  1332 History   First MD Initiated Contact with Patient 03/13/16 1405     Chief Complaint  Patient presents with  . Chest Pain  . Emesis  . Abdominal Pain     (Consider location/radiation/quality/duration/timing/severity/associated sxs/prior Treatment) Patient is a 50 y.o. female presenting with chest pain, vomiting, and abdominal pain. The history is provided by the patient.  Chest Pain Associated symptoms: abdominal pain and vomiting   Associated symptoms: no back pain, no cough, no fever, no headache, no numbness, no shortness of breath and no weakness   Emesis Associated symptoms: abdominal pain   Associated symptoms: no chills, no headaches and no sore throat   Abdominal Pain Associated symptoms: chest pain and vomiting   Associated symptoms: no chills, no cough, no dysuria, no fever, no shortness of breath, no sore throat, no vaginal bleeding and no vaginal discharge   Patient c/o mid chest pain, at rest, for the past week. Pain constant, dull, non radiating. No relation to activity or exertion. Not pleuritic. Located midline/chest wall. No associated sob, or diaphoresis.  No cough or uri c/o. No fever or chills. No family hx premature cad - one grandmother had heart attack in older age. No leg pain or swelling. No hx dvt or pe. No recent surgery, immobility or trauma. Patient also states pain to left scapula area. Also states at times feels as if has to clear throat. No trouble breathing or swallowing.  Patient also notes pain to right abdomen today.  States 2 episodes emesis. Normal appetite. No diarrhea. Currently abd pain has resolved.      Past Medical History  Diagnosis Date  . Seasonal allergies   . HA (headache)   . Migraine   . Memory loss, short term     [name] 04-03-13   . Hypersomnia, persistent 04/29/2013  . History of borderline personality disorder   . Bipolar disorder [facility])    Past Surgical History  Procedure  Laterality Date  . Knee surgery Right 96,98,00,02,05,2014  . Wrist surgery Right [phone]  . Abdominal hysterectomy  00  . Nose surgery      [id number]  . Nasal sinus surgery     Family History  Problem Relation Age of Onset  . High blood pressure Maternal Grandfather   . Depression Mother   . Breast cancer Maternal Grandmother   . Diabetes Maternal Grandmother   . High blood pressure Maternal Grandmother   . Breast cancer Sister   . Schizophrenia Brother    Social History  Substance Use Topics  . Smoking status: Never Smoker   . Smokeless tobacco: Never Used  . Alcohol Use: 6.0 oz/week    12 Standard drinks or equivalent per week     Comment: 1-2 monthly    OB History    No data available     Review of Systems  Constitutional: Negative for fever and chills.  HENT: Negative for sore throat.   Eyes: Negative for visual disturbance.  Respiratory: Negative for cough and shortness of breath.   Cardiovascular: Positive for chest pain.  Gastrointestinal: Positive for vomiting and abdominal pain.  Genitourinary: Negative for dysuria, flank pain, vaginal bleeding and vaginal discharge.  Musculoskeletal: Negative for back pain and neck pain.  Skin: Negative for rash.  Neurological: Negative for weakness, numbness and headaches.  Hematological: Does not bruise/bleed easily.  Psychiatric/Behavioral: Negative for confusion.      Allergies  Aspirin; Bee venom;  Penicillins; Bactrim; and Sulfa antibiotics  Home Medications   Prior to Admission medications   Medication Sig Start Date End Date Taking? Authorizing Provider  Armodafinil 250 MG tablet Take 250 mg by mouth daily. Reported on 01/31/2016    Historical Provider, MD  dicyclomine (BENTYL) 20 MG tablet Take 1 tablet (20 mg total) by mouth every 6 (six) hours as needed for spasms (for abdominal cramping). 02/08/15   Linton Flemings, MD  hydrocortisone (ANUSOL-HC) 25 MG suppository Place 1 suppository rectally 2 (two) times daily  as needed for hemorrhoids. Reported on 11/03/2015 02/04/15   Historical Provider, MD  lamoTRIgine (LAMICTAL) 100 MG tablet TAKE 1 TABLET (100 MG TOTAL) BY MOUTH DAILY. 01/31/16   Charlcie Cradle, MD  LATUDA 40 MG TABS tablet Take 1 tablet (40 mg total) by mouth daily with breakfast. 01/31/16   Charlcie Cradle, MD  nortriptyline (PAMELOR) 25 MG capsule Take 25 mg by mouth at bedtime. 12/31/14   Historical Provider, MD  ondansetron (ZOFRAN ODT) 8 MG disintegrating tablet Take 1 tablet (8 mg total) by mouth every 8 (eight) hours as needed for nausea or vomiting. 02/08/15   Linton Flemings, MD  QUEtiapine (SEROQUEL XR) 300 MG 24 hr tablet Take 1 tablet (300 mg total) by mouth at bedtime. 01/31/16   Charlcie Cradle, MD  SUMAtriptan (IMITREX) 100 MG tablet Take 50-100 mg by mouth as directed. at onset of headache and may repeat once in 2 hours if needed 12/31/14   Historical Provider, MD   BP 123/66 mmHg  Pulse 73  Temp(Src) 98.5 F (36.9 C) (Oral)  Resp 16  Ht 5\' 9"  (1.753 m)  Wt 108.863 kg  BMI 35.43 kg/m2  SpO2 98% Physical Exam  Constitutional: She appears well-developed and well-nourished. No distress.  HENT:  Mouth/Throat: Oropharynx is clear and moist.  Eyes: Conjunctivae are normal. No scleral icterus.  Neck: Neck supple. No tracheal deviation present.  Cardiovascular: Normal rate, regular rhythm, normal heart sounds and intact distal pulses.  Exam reveals no gallop and no friction rub.   No murmur heard. Pulmonary/Chest: Effort normal and breath sounds normal. No respiratory distress.  Abdominal: Soft. Normal appearance and bowel sounds are normal. She exhibits no distension and no mass. There is no tenderness. There is no rebound and no guarding.  Genitourinary:  No cva tenderness  Musculoskeletal: She exhibits no edema or tenderness.  CTL spine non tender. Aligned.   Neurological: She is alert.  Steady gait.   Skin: Skin is warm and dry. No rash noted. She is not diaphoretic.  Psychiatric: She has a  normal mood and affect.  Nursing note and vitals reviewed.   ED Course  Procedures (including critical care time) Labs Review   Results for orders placed or performed during the hospital encounter of 03/13/16  Lipase, blood  Result Value Ref Range   Lipase 15 11 - 51 U/L  Comprehensive metabolic panel  Result Value Ref Range   Sodium 138 135 - 145 mmol/L   Potassium 3.6 3.5 - 5.1 mmol/L   Chloride 105 101 - 111 mmol/L   CO2 26 22 - 32 mmol/L   Glucose, Bld 100 (H) 65 - 99 mg/dL   BUN 8 6 - 20 mg/dL   Creatinine, Ser 0.86 0.44 - 1.00 mg/dL   Calcium 9.0 8.9 - 10.3 mg/dL   Total Protein 7.5 6.5 - 8.1 g/dL   Albumin 4.2 3.5 - 5.0 g/dL   AST 22 15 - 41 U/L   ALT 27 14 -  54 U/L   Alkaline Phosphatase 58 38 - 126 U/L   Total Bilirubin 0.6 0.3 - 1.2 mg/dL   GFR calc non Af Amer >60 >60 mL/min   GFR calc Af Amer >60 >60 mL/min   Anion gap 7 5 - 15  CBC  Result Value Ref Range   WBC 7.6 4.0 - 10.5 K/uL   RBC 4.61 3.87 - 5.11 MIL/uL   Hemoglobin 12.7 12.0 - 15.0 g/dL   HCT 40.9 36.0 - 46.0 %   MCV 88.7 78.0 - 100.0 fL   MCH 27.5 26.0 - 34.0 pg   MCHC 31.1 30.0 - 36.0 g/dL   RDW 13.5 11.5 - 15.5 %   Platelets 268 150 - 400 K/uL  I-stat troponin, ED  Result Value Ref Range   Troponin i, poc 0.00 0.00 - 0.08 ng/mL   Comment 3           Dg Chest 2 View  03/13/2016  CLINICAL DATA:  Five day history of shortness of breath and chest pain EXAM: CHEST  2 VIEW COMPARISON:  None. FINDINGS: The lungs are clear. The heart size and pulmonary vascularity are normal. No adenopathy. No pneumothorax. No bone lesions. IMPRESSION: No edema or consolidation. Electronically Signed   By: Lowella Grip III M.D.   On: 03/13/2016 14:21       I have personally reviewed and evaluated these images and lab results as part of my medical decision-making.   EKG Interpretation   Date/Time:  Tuesday March 13 2016 13:38:40 EDT Ventricular Rate:  70 PR Interval:    QRS Duration: 75 QT Interval:   392 QTC Calculation: 423 R Axis:   60 Text Interpretation:  Sinus rhythm No significant change since last  tracing Confirmed by Saphyre Cillo  MD, Lennette Bihari ([id number]) on 03/13/2016 2:06:07 PM      MDM   Iv ns. Labs. Cxr.  Reviewed nursing notes and prior charts for additional history.   Vitals normal. Labs unremarkable. After symptoms for several days, trop neg/o - does not appear c/w acs.   Patient states abd discomfort has resolved. And abd is soft and non tender.   Left upper back pain appears possible musculoskeletal, spine nt.  Tylenol po.  Po fluids - pt tolerates well.  Rec close pcp f/u.  Return precautions provided.  Patient currently appears stable for d/c.       Lajean Saver, MD 03/13/16 1439

## 2016-03-13 NOTE — ED Notes (Signed)
Pt reports central CP accompanied by weakness and nausea since Thursday. Pain radiates to L shoulder and neck. Has also had emesis and RLQ abd pain as well. No dysuria.

## 2016-04-05 ENCOUNTER — Encounter (HOSPITAL_COMMUNITY): Payer: Self-pay | Admitting: Psychiatry

## 2016-04-05 ENCOUNTER — Ambulatory Visit (INDEPENDENT_AMBULATORY_CARE_PROVIDER_SITE_OTHER): Payer: 59 | Admitting: Psychiatry

## 2016-04-05 DIAGNOSIS — F319 Bipolar disorder, unspecified: Secondary | ICD-10-CM | POA: Diagnosis not present

## 2016-04-05 MED ORDER — LATUDA 40 MG PO TABS: 40.0000 mg | ORAL_TABLET | Freq: Every day | ORAL | 3 refills | Status: DC

## 2016-04-05 MED ORDER — LAMOTRIGINE 100 MG PO TABS: ORAL_TABLET | ORAL | 3 refills | Status: DC

## 2016-04-05 MED ORDER — QUETIAPINE FUMARATE ER 300 MG PO TB24: 300.0000 mg | ORAL_TABLET | Freq: Every day | ORAL | 3 refills | Status: DC

## 2016-04-05 NOTE — Progress Notes (Signed)
Patient ID: Robin [name], female   DOB: 1965/12/18, 50 y.o.   MRN: [id number] [facility] MD Progress Note  04/05/2016 11:14 AM Shaniya Heidrick  MRN:  [id number] Subjective:  "good overall"  Diagnosis:   Patient Active Problem List   Diagnosis Date Noted  . Bipolar I disorder, most recent episode (or current) unspecified [F31.9] 11/23/2014  . Hypersomnia, persistent [G47.10] 04/29/2013  . Memory loss, short term [R41.3]   . Cognitive decline [R41.89] 04/03/2013  . Other malaise and fatigue [R53.81, R53.83] 04/03/2013  . Depression [F32.9] 04/03/2013     HPI:  Denies any concerns today. Here with son.  About 2 weeks she feels like she in a fog with shoulder tension for one day. Pt does not feel more anxious on those days. She does not understand the reason.  I began about 6 weeks ago.   Things at home are good. Pt is working. Will start at [employer] in the fall.   Pt denies depression. Denies anhedonia, isolation, low motivation, poor hygiene, worthlessness and hopelessness. Denies SI/HI. Denies AVH.   Denies manic and hypomanic symptoms including periods of decreased need for sleep, increased energy, mood lability, impulsivity, FOI, and excessive spending.  Sleep is variable due to recent surgery.  Appetite is good.  Energy is on the low side. Concentration is good.   Pt is taking meds as prescribed and denies SE.       Past Medical History:  Past Medical History:  Diagnosis Date  . Bipolar disorder ([place])   . HA (headache)   . History of borderline personality disorder   . Hypersomnia, persistent 04/29/2013  . Memory loss, short term    [name] 04-03-13   . Migraine   . Seasonal allergies     Past Surgical History:  Procedure Laterality Date  . ABDOMINAL HYSTERECTOMY  00  . KNEE SURGERY Right 96,98,00,02,05,2014  . NASAL SINUS SURGERY    . NOSE SURGERY     [id number]  . WRIST SURGERY Right [phone]   Family History:  Family History  Problem Relation Age of Onset  . High  blood pressure Maternal Grandfather   . Depression Mother   . Breast cancer Maternal Grandmother   . Diabetes Maternal Grandmother   . High blood pressure Maternal Grandmother   . Breast cancer Sister   . Schizophrenia Brother    Social History:  History  Alcohol Use  . 6.0 oz/week  . 12 Standard drinks or equivalent per week    Comment: 1-2 monthly      History  Drug Use No    Social History   Social History  . Marital status: Married    Spouse name: [name]  . Number of children: 2  . Years of education: AA   Occupational History  .  [employer] [place]   Social History Main Topics  . Smoking status: Never Smoker  . Smokeless tobacco: Never Used  . Alcohol use 6.0 oz/week    12 Standard drinks or equivalent per week     Comment: 1-2 monthly   . Drug use: No  . Sexual activity: Not Asked   Other Topics Concern  . None   Social History Narrative   Pneumonia vaccine 2005   Patient lives at home with wife and 2 sons.    Patient works in [employer].    Patient has her AA             Sleep: Poor  Appetite:  Good    Musculoskeletal: Strength & Muscle Tone: within normal limits Gait & Station: normal Patient leans: Stand straight   Psychiatric Specialty Exam: Physical Exam  Review of Systems  Constitutional: Negative for chills, diaphoresis, fever, malaise/fatigue and weight loss.  HENT: Positive for congestion. Negative for ear discharge, ear pain, hearing loss, nosebleeds, sore throat and tinnitus.   Eyes: Negative for blurred vision, photophobia and pain.  Respiratory: Negative for cough and shortness of breath.   Cardiovascular: Negative for chest pain, palpitations and leg swelling.  Gastrointestinal: Negative for abdominal pain, constipation, diarrhea, heartburn, nausea and vomiting.  Genitourinary: Negative for dysuria, flank pain, hematuria and urgency.  Musculoskeletal: Positive for joint pain and myalgias.  Negative for back pain and falls.  Skin: Negative for itching and rash.  Neurological: Negative for dizziness, tingling, tremors, sensory change, seizures, weakness and headaches.  Endo/Heme/Allergies: Negative for environmental allergies and polydipsia. Does not bruise/bleed easily.  Psychiatric/Behavioral: Negative for depression, hallucinations, substance abuse and suicidal ideas. The patient is not nervous/anxious and does not have insomnia.     Blood pressure 132/82, pulse 80, height 5\' 10"  (1.778 m), weight 239 lb 9.6 oz (108.7 kg).Body mass index is 34.38 kg/m.  General Appearance: Casual  Eye Contact::  Good  Speech:  Clear and Coherent and Normal Rate  Volume:  Normal  Mood:  Euthymic  Affect:  Appropriate  Thought Process:  Goal Directed, Linear and Logical  Orientation:  Full (Time, Place, and Person)  Thought Content:  WDL  Suicidal Thoughts:  No  Homicidal Thoughts:  No  Memory:  Immediate;   Good Recent;   Good Remote;   Good  Judgement:  Good  Insight:  Good  Psychomotor Activity:  Normal  Concentration:  Good  Recall:  Good  Fund of Knowledge:Good  Language: Good  Akathisia:  No  Handed:  Right  AIMS (if indicated):     Assets:  Communication Skills Desire for Improvement Physical Health Resilience Social Support  ADL's:  Intact  Cognition: WNL  Sleep:        Current Medications: Current Outpatient Prescriptions  Medication Sig Dispense Refill  . Armodafinil 250 MG tablet Take 250 mg by mouth daily. Reported on 01/31/2016    . dicyclomine (BENTYL) 20 MG tablet Take 1 tablet (20 mg total) by mouth every 6 (six) hours as needed for spasms (for abdominal cramping). 20 tablet 0  . hydrocortisone (ANUSOL-HC) 25 MG suppository Place 1 suppository rectally 2 (two) times daily as needed for hemorrhoids. Reported on 11/03/2015  0  . lamoTRIgine (LAMICTAL) 100 MG tablet TAKE 1 TABLET (100 MG TOTAL) BY MOUTH DAILY. 90 tablet 1  . LATUDA 40 MG TABS tablet Take 1  tablet (40 mg total) by mouth daily with breakfast. 90 tablet 1  . nortriptyline (PAMELOR) 25 MG capsule Take 25 mg by mouth at bedtime.  5  . ondansetron (ZOFRAN ODT) 8 MG disintegrating tablet Take 1 tablet (8 mg total) by mouth every 8 (eight) hours as needed for nausea or vomiting. 20 tablet 0  . QUEtiapine (SEROQUEL XR) 300 MG 24 hr tablet Take 1 tablet (300 mg total) by mouth at bedtime. 90 tablet 1  . SUMAtriptan (IMITREX) 100 MG tablet Take 50-100 mg by mouth as directed. at onset of headache and may repeat once in 2 hours if needed  11   No current facility-administered medications for this visit.     Lab Results: No results found for this or any previous visit (from the  past 48 hour(s)).   Treatment Plan Summary: Medication management  AXIS I Bipolar I d/o- current episode unspecified  AXIS II Borderline Personality Dis. by hx           Plan of Care: Medication management with supportive therapy. Risks/benefits and SE of the medication discussed. Pt verbalized understanding and verbal consent obtained for treatment. Affirm with the patient that the medications are taken as ordered. Patient expressed understanding of how their medications were to be used.     Laboratory:02/07/2015 CBC WNL , CMP showed creat 1.08, HbA1c 5.7,   Lipid panel LDL 120, TSH WNL, Prolactin level 11.4  EKG 08/11/2015 QTc 439, NSR   Psychotherapy: Therapy: brief supportive therapy provided. Discussed psychosocial stressors in detail.    Medications: Continue Lamictal 100mg  po qD for mood Latuda 40mg  po qD for mood Seroquel XR 300mg  po qHS for mood Nortriptyline prescribed by neurologist for HA  Pt reports she has been on this combination for over 2 yrs and is stable. Pt appears to be in need of treatment with 2 antipsychotics as she denies manic episodes on this combination. Benefit outweighs the risk and pt denies SE.   Routine PRN Medications: No  Consultations: encouraged  to continue therapy with Gevena Barre  Safety Concerns: Pt denies SI and is at an acute low risk for suicide.Patient told to call clinic if any problems occur. Patient advised to go to ER if they should develop SI/HI, side effects, or if symptoms worsen. Has crisis numbers to call if needed. Pt verbalized understanding.   Other: F/up in 3-4 months or sooner if needed

## 2016-07-10 ENCOUNTER — Encounter (HOSPITAL_COMMUNITY): Payer: Self-pay | Admitting: Psychiatry

## 2016-07-10 ENCOUNTER — Ambulatory Visit (INDEPENDENT_AMBULATORY_CARE_PROVIDER_SITE_OTHER): Payer: 59 | Admitting: Psychiatry

## 2016-07-10 VITALS — BP 124/76 | HR 82 | Ht 70.0 in | Wt 231.4 lb

## 2016-07-10 DIAGNOSIS — F5105 Insomnia due to other mental disorder: Secondary | ICD-10-CM | POA: Diagnosis not present

## 2016-07-10 DIAGNOSIS — Z9889 Other specified postprocedural states: Secondary | ICD-10-CM

## 2016-07-10 DIAGNOSIS — F319 Bipolar disorder, unspecified: Secondary | ICD-10-CM | POA: Diagnosis not present

## 2016-07-10 DIAGNOSIS — F99 Mental disorder, not otherwise specified: Secondary | ICD-10-CM

## 2016-07-10 DIAGNOSIS — Z833 Family history of diabetes mellitus: Secondary | ICD-10-CM

## 2016-07-10 DIAGNOSIS — Z803 Family history of malignant neoplasm of breast: Secondary | ICD-10-CM

## 2016-07-10 DIAGNOSIS — Z818 Family history of other mental and behavioral disorders: Secondary | ICD-10-CM

## 2016-07-10 MED ORDER — TRAZODONE HCL 50 MG PO TABS
ORAL_TABLET | ORAL | 1 refills | Status: DC
Start: 2016-07-10 — End: 2016-10-11

## 2016-07-10 MED ORDER — LAMOTRIGINE 100 MG PO TABS: ORAL_TABLET | ORAL | 3 refills | Status: DC

## 2016-07-10 MED ORDER — LATUDA 40 MG PO TABS: 40.0000 mg | ORAL_TABLET | Freq: Every day | ORAL | 3 refills | Status: DC

## 2016-07-10 MED ORDER — QUETIAPINE FUMARATE ER 300 MG PO TB24: 300.0000 mg | ORAL_TABLET | Freq: Every day | ORAL | 3 refills | Status: DC

## 2016-07-10 NOTE — Progress Notes (Signed)
Patient ID: Robin [name], female   DOB: 1966/04/30, 50 y.o.   MRN: [id number] [facility] MD Progress Note  07/10/2016 8:57 AM Keyeria Sinha  MRN:  [id number] Subjective:  "I have started a new business"  Diagnosis:   Patient Active Problem List   Diagnosis Date Noted  . Bipolar I disorder, most recent episode (or current) unspecified [F31.9] 11/23/2014  . Hypersomnia, persistent [G47.10] 04/29/2013  . Memory loss, short term [R41.3]   . Cognitive decline [R41.89] 04/03/2013  . Other malaise and fatigue [R53.81, R53.83] 04/03/2013  . Depression [F32.9] 04/03/2013     HPI:  Denies any concerns today. Here with wife.  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 They are working hard and it can be stressful.   About 2 weeks she feels like she in a fog with shoulder tension for one day. Pt does not feel more anxious on those days. She does not understand the reason.  I began about 6 weeks ago.   Things at home are good. Pt is working. Currently at ANT and is getting all A's.  Pt denies depression. Denies anhedonia, isolation, low motivation, poor hygiene, worthlessness and hopelessness. Denies SI/HI. Denies AVH. She did have a few bad days at the beginning of Nov due to the anniversary of her brother's death.   Denies manic and hypomanic symptoms including periods of decreased need for sleep, increased energy, mood lability, impulsivity, FOI, and excessive spending.  Sleep is variable. Some nights it takes her hours to falls asleep. She is usually getting about 5.5 hrs/night.  Appetite is good.  Energy is on the low side. Concentration is good.   Pt is taking meds as prescribed and denies SE.       Past Medical History:  Past Medical History:  Diagnosis Date  . Bipolar disorder ([place])   . HA (headache)   . History of borderline personality disorder   . Hypersomnia, persistent 04/29/2013  . Memory loss, short term    [name] 04-03-13   . Migraine   . Seasonal allergies      Past Surgical History:  Procedure Laterality Date  . ABDOMINAL HYSTERECTOMY  00  . KNEE SURGERY Right 96,98,00,02,05,2014  . NASAL SINUS SURGERY    . NOSE SURGERY     [id number]  . WRIST SURGERY Right [phone]   Family History:  Family History  Problem Relation Age of Onset  . Depression Mother   . High blood pressure Maternal Grandfather   . Breast cancer Maternal Grandmother   . Diabetes Maternal Grandmother   . High blood pressure Maternal Grandmother   . Breast cancer Sister   . Schizophrenia Brother    Social History:  History  Alcohol Use  . 6.0 oz/week  . 12 Standard drinks or equivalent per week    Comment: 1-2 monthly      History  Drug Use No    Social History   Social History  . Marital status: Married    Spouse name: [name]  . Number of children: 2  . Years of education: AA   Occupational History  .  [employer] [place]   Social History Main Topics  . Smoking status: Never Smoker  . Smokeless tobacco: Never Used  . Alcohol use 6.0 oz/week    12 Standard drinks or equivalent per week     Comment: 1-2 monthly   . Drug use: No  . Sexual activity: Not Asked   Other Topics  Concern  . None   Social History Narrative   Pneumonia vaccine 2005   Patient lives at home with wife and 2 sons.    Patient works in [place].    Patient has her AA             Sleep: Poor  Appetite:  Good    Musculoskeletal: Strength & Muscle Tone: within normal limits Gait & Station: normal Patient leans: Stand straight   Psychiatric Specialty Exam: Physical Exam  Review of Systems  Constitutional: Negative for chills, diaphoresis, fever, malaise/fatigue and weight loss.  HENT: Positive for congestion. Negative for ear discharge, ear pain, hearing loss, nosebleeds, sore throat and tinnitus.   Eyes: Negative for blurred vision, photophobia and pain.  Respiratory: Negative for cough and shortness of breath.   Cardiovascular:  Negative for chest pain, palpitations and leg swelling.  Gastrointestinal: Negative for abdominal pain, constipation, diarrhea, heartburn, nausea and vomiting.  Genitourinary: Negative for dysuria, flank pain, hematuria and urgency.  Musculoskeletal: Positive for joint pain and myalgias. Negative for back pain and falls.  Skin: Negative for itching and rash.  Neurological: Negative for dizziness, tingling, tremors, sensory change, seizures, weakness and headaches.  Endo/Heme/Allergies: Negative for environmental allergies and polydipsia. Does not bruise/bleed easily.  Psychiatric/Behavioral: Negative for depression, hallucinations, substance abuse and suicidal ideas. The patient is not nervous/anxious and does not have insomnia.     Blood pressure 124/76, pulse 82, height 5\' 10"  (1.778 m), weight 231 lb 6.4 oz (105 kg).Body mass index is 33.2 kg/m.  General Appearance: Casual  Eye Contact::  Good  Speech:  Clear and Coherent and Normal Rate  Volume:  Normal  Mood:  Euthymic  Affect:  Appropriate  Thought Process:  Goal Directed, Linear and Logical  Orientation:  Full (Time, Place, and Person)  Thought Content:  WDL  Suicidal Thoughts:  No  Homicidal Thoughts:  No  Memory:  Immediate;   Good Recent;   Good Remote;   Good  Judgement:  Good  Insight:  Good  Psychomotor Activity:  Normal  Concentration:  Good  Recall:  Good  Fund of Knowledge:Good  Language: Good  Akathisia:  No  Handed:  Right  AIMS (if indicated):     Assets:  Communication Skills Desire for Improvement Physical Health Resilience Social Support  ADL's:  Intact  Cognition: WNL  Sleep:        Current Medications: Current Outpatient Prescriptions  Medication Sig Dispense Refill  . Armodafinil 250 MG tablet Take 250 mg by mouth daily. Reported on 01/31/2016    . dicyclomine (BENTYL) 20 MG tablet Take 1 tablet (20 mg total) by mouth every 6 (six) hours as needed for spasms (for abdominal cramping). 20 tablet  0  . hydrocortisone (ANUSOL-HC) 25 MG suppository Place 1 suppository rectally 2 (two) times daily as needed for hemorrhoids. Reported on 11/03/2015  0  . lamoTRIgine (LAMICTAL) 100 MG tablet TAKE 1 TABLET (100 MG TOTAL) BY MOUTH DAILY. 30 tablet 3  . LATUDA 40 MG TABS tablet Take 1 tablet (40 mg total) by mouth daily with breakfast. 30 tablet 3  . nortriptyline (PAMELOR) 25 MG capsule Take 25 mg by mouth at bedtime.  5  . ondansetron (ZOFRAN ODT) 8 MG disintegrating tablet Take 1 tablet (8 mg total) by mouth every 8 (eight) hours as needed for nausea or vomiting. 20 tablet 0  . QUEtiapine (SEROQUEL XR) 300 MG 24 hr tablet Take 1 tablet (300 mg total) by mouth at bedtime. 30 tablet  3  . SUMAtriptan (IMITREX) 100 MG tablet Take 50-100 mg by mouth as directed. at onset of headache and may repeat once in 2 hours if needed  11   No current facility-administered medications for this visit.     Lab Results: No results found for this or any previous visit (from the past 48 hour(s)).   Treatment Plan Summary: Medication management  AXIS I Bipolar I d/o- current episode unspecified; Insomnia  AXIS II Borderline Personality Dis. by hx           Plan of Care: Medication management with supportive therapy. Risks/benefits and SE of the medication discussed. Pt verbalized understanding and verbal consent obtained for treatment. Affirm with the patient that the medications are taken as ordered. Patient expressed understanding of how their medications were to be used.     Laboratory:02/07/2015 CBC WNL , CMP showed creat 1.08, HbA1c 5.7,   Lipid panel LDL 120, TSH WNL, Prolactin level 11.4  EKG 08/11/2015 QTc 439, NSR   Psychotherapy: Therapy: brief supportive therapy provided. Discussed psychosocial stressors in detail.    Medications: Continue Lamictal 100mg  po qD for mood Latuda 40mg  po qD for mood Seroquel XR 300mg  po qHS for mood Nortriptyline prescribed by neurologist for  HA Start trial of Trazodone 50-100mg  po qHS prn insomnia  Pt reports she has been on this combination for over 2 yrs and is stable. Pt appears to be in need of treatment with 2 antipsychotics as she denies manic episodes on this combination. Benefit outweighs the risk and pt denies SE.   Routine PRN Medications: No  Consultations: encouraged to continue therapy with Gevena Barre  Safety Concerns: Pt denies SI and is at an acute low risk for suicide.Patient told to call clinic if any problems occur. Patient advised to go to ER if they should develop SI/HI, side effects, or if symptoms worsen. Has crisis numbers to call if needed. Pt verbalized understanding.   Other: F/up in 3-4 months or sooner if needed

## 2016-07-18 DIAGNOSIS — G47429 Narcolepsy in conditions classified elsewhere without cataplexy: Secondary | ICD-10-CM | POA: Insufficient documentation

## 2016-08-23 LAB — HM COLONOSCOPY

## 2016-10-11 ENCOUNTER — Ambulatory Visit (INDEPENDENT_AMBULATORY_CARE_PROVIDER_SITE_OTHER): Payer: 59 | Admitting: Psychiatry

## 2016-10-11 ENCOUNTER — Encounter (HOSPITAL_COMMUNITY): Payer: Self-pay | Admitting: Psychiatry

## 2016-10-11 DIAGNOSIS — Z833 Family history of diabetes mellitus: Secondary | ICD-10-CM

## 2016-10-11 DIAGNOSIS — Z818 Family history of other mental and behavioral disorders: Secondary | ICD-10-CM

## 2016-10-11 DIAGNOSIS — Z90710 Acquired absence of both cervix and uterus: Secondary | ICD-10-CM

## 2016-10-11 DIAGNOSIS — F99 Mental disorder, not otherwise specified: Secondary | ICD-10-CM

## 2016-10-11 DIAGNOSIS — F319 Bipolar disorder, unspecified: Secondary | ICD-10-CM

## 2016-10-11 DIAGNOSIS — F5105 Insomnia due to other mental disorder: Secondary | ICD-10-CM | POA: Diagnosis not present

## 2016-10-11 DIAGNOSIS — Z9889 Other specified postprocedural states: Secondary | ICD-10-CM

## 2016-10-11 DIAGNOSIS — Z803 Family history of malignant neoplasm of breast: Secondary | ICD-10-CM

## 2016-10-11 DIAGNOSIS — Z79899 Other long term (current) drug therapy: Secondary | ICD-10-CM

## 2016-10-11 MED ORDER — LATUDA 40 MG PO TABS: 40.0000 mg | ORAL_TABLET | Freq: Every day | ORAL | 3 refills | Status: DC

## 2016-10-11 MED ORDER — LAMOTRIGINE 100 MG PO TABS: ORAL_TABLET | ORAL | 3 refills | Status: DC

## 2016-10-11 MED ORDER — TRAZODONE HCL 50 MG PO TABS: ORAL_TABLET | ORAL | 1 refills | Status: DC

## 2016-10-11 MED ORDER — QUETIAPINE FUMARATE ER 300 MG PO TB24: 300.0000 mg | ORAL_TABLET | Freq: Every day | ORAL | 3 refills | Status: DC

## 2016-10-11 NOTE — Progress Notes (Signed)
Patient ID: Robin [name], female   DOB: 08/07/1966, 51 y.o.   MRN: [id number] [facility] MD Progress Note  10/11/2016 10:40 AM Vannie Winemiller  MRN:  [id number] Subjective:  "nothing new"  Diagnosis:   Patient Active Problem List   Diagnosis Date Noted  . Bipolar I disorder, most recent episode (or current) unspecified [F31.9] 11/23/2014  . Hypersomnia, persistent [G47.10] 04/29/2013  . Memory loss, short term [R41.3]   . Cognitive decline [R41.89] 04/03/2013  . Other malaise and fatigue [R53.81, R53.83] 04/03/2013  . Depression [F32.9] 04/03/2013     HPI: reviewed information below with patient on 10/11/16 and same as previous visits except as noted  Denies any concerns today.  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 They are working hard and it can be stressful. Business is slowing picking up.   Things at home are good. Currently at ANT and is getting all A's.  Anxiety comes and goes. She feels like she is in a hole she is trying to get out of. States it lasts for a few days then resolves.   Pt states depression comes and goes. It happens 1-3x/month. She has HA and shoulder tension. Mood is down and she has low motivation. Denies SI/HI.Marland Kitchen States it lasts for 1-2 days then resolves.   Denies manic and hypomanic symptoms including periods of decreased need for sleep, increased energy, mood lability, impulsivity, FOI, and excessive spending.  Sleep is good. She is usually getting about 6 hrs/night.  Appetite is good.  Energy is on the low side. Concentration is good.   Pt is taking meds as prescribed and denies SE.       Past Medical History:  Past Medical History:  Diagnosis Date  . Bipolar disorder ([place])   . HA (headache)   . History of borderline personality disorder   . Hypersomnia, persistent 04/29/2013  . Memory loss, short term    [name] 04-03-13   . Migraine   . Seasonal allergies     Past Surgical History:  Procedure Laterality Date  .  ABDOMINAL HYSTERECTOMY  00  . KNEE SURGERY Right 96,98,00,02,05,2014  . NASAL SINUS SURGERY    . NOSE SURGERY     [id number]  . WRIST SURGERY Right [phone]   Family History:  Family History  Problem Relation Age of Onset  . Depression Mother   . High blood pressure Maternal Grandfather   . Breast cancer Maternal Grandmother   . Diabetes Maternal Grandmother   . High blood pressure Maternal Grandmother   . Breast cancer Sister   . Schizophrenia Brother    Social History:  History  Alcohol Use  . 6.0 oz/week  . 12 Standard drinks or equivalent per week    Comment: 1-2 monthly      History  Drug Use No    Social History   Social History  . Marital status: Married    Spouse name: [name]  . Number of children: 2  . Years of education: AA   Occupational History  .  [employer] [place]   Social History Main Topics  . Smoking status: Never Smoker  . Smokeless tobacco: Never Used  . Alcohol use 6.0 oz/week    12 Standard drinks or equivalent per week     Comment: 1-2 monthly   . Drug use: No  . Sexual activity: Not Asked   Other Topics Concern  . None   Social History Narrative  Pneumonia vaccine 2005   Patient lives at home with wife and 2 sons.    Patient works in [place].    Patient has her AA             Sleep: Good  Appetite:  Good    Musculoskeletal: Strength & Muscle Tone: within normal limits Gait & Station: normal Patient leans: Stand straight   Psychiatric Specialty Exam: reviewed ROS and MSE on 10/11/16 and same as previous visits except as noted  Physical Exam  Review of Systems  Constitutional: Negative for chills, diaphoresis, fever, malaise/fatigue and weight loss.  HENT: Negative for congestion, ear pain, hearing loss, nosebleeds, sinus pain and sore throat.   Neurological: Negative for dizziness, tremors, sensory change, seizures, loss of consciousness, weakness and headaches.   Psychiatric/Behavioral: Positive for depression. Negative for hallucinations, substance abuse and suicidal ideas. The patient is nervous/anxious. The patient does not have insomnia.     Blood pressure 128/74, pulse 88, height 5\' 10"  (1.778 m), weight 238 lb (108 kg).Body mass index is 34.15 kg/m.  General Appearance: Casual  Eye Contact::  Good  Speech:  Clear and Coherent and Normal Rate  Volume:  Normal  Mood:  Euthymic  Affect:  Appropriate  Thought Process:  Goal Directed, Linear and Logical  Orientation:  Full (Time, Place, and Person)  Thought Content:  WDL  Suicidal Thoughts:  No  Homicidal Thoughts:  No  Memory:  Immediate;   Good Recent;   Good Remote;   Good  Judgement:  Good  Insight:  Good  Psychomotor Activity:  Normal  Concentration:  Good  Recall:  Good  Fund of Knowledge:Good  Language: Good  Akathisia:  No  Handed:  Right  AIMS (if indicated):     Assets:  Communication Skills Desire for Improvement Physical Health Resilience Social Support  ADL's:  Intact  Cognition: WNL  Sleep:        Current Medications: Current Outpatient Prescriptions  Medication Sig Dispense Refill  . Armodafinil 250 MG tablet Take 250 mg by mouth daily. Reported on 01/31/2016    . dicyclomine (BENTYL) 20 MG tablet Take 1 tablet (20 mg total) by mouth every 6 (six) hours as needed for spasms (for abdominal cramping). 20 tablet 0  . hydrocortisone (ANUSOL-HC) 25 MG suppository Place 1 suppository rectally 2 (two) times daily as needed for hemorrhoids. Reported on 11/03/2015  0  . lamoTRIgine (LAMICTAL) 100 MG tablet TAKE 1 TABLET (100 MG TOTAL) BY MOUTH DAILY. 30 tablet 3  . LATUDA 40 MG TABS tablet Take 1 tablet (40 mg total) by mouth daily with breakfast. 30 tablet 3  . ondansetron (ZOFRAN ODT) 8 MG disintegrating tablet Take 1 tablet (8 mg total) by mouth every 8 (eight) hours as needed for nausea or vomiting. 20 tablet 0  . QUEtiapine (SEROQUEL XR) 300 MG 24 hr tablet Take 1  tablet (300 mg total) by mouth at bedtime. 30 tablet 3  . SUMAtriptan (IMITREX) 100 MG tablet Take 50-100 mg by mouth as directed. at onset of headache and may repeat once in 2 hours if needed  11  . traZODone (DESYREL) 50 MG tablet Take 50-100mg  po qHS prn insomnia 60 tablet 1  . nortriptyline (PAMELOR) 25 MG capsule Take 25 mg by mouth at bedtime.  5   No current facility-administered medications for this visit.     Lab Results: No results found for this or any previous visit (from the past 48 hour(s)).   Treatment Plan Summary: Medication  management    Reviewed A&P on 10/11/16 and same as previous visits except as noted  AXIS I Bipolar I d/o- current episode unspecified; Insomnia  AXIS II Borderline Personality Dis. by hx           Plan of Care: Medication management with supportive therapy. Risks/benefits and SE of the medication discussed. Pt verbalized understanding and verbal consent obtained for treatment. Affirm with the patient that the medications are taken as ordered. Patient expressed understanding of how their medications were to be used.     Laboratory:03/13/2016 CBC WNL   EKG 03/14/2016 QTc 435, NSR   Psychotherapy: Therapy: brief supportive therapy provided. Discussed psychosocial stressors in detail.    Medications: Continue Lamictal 100mg  po qD for mood Latuda 40mg  po qD for mood Seroquel XR 300mg  po qHS for mood Nortriptyline prescribed by neurologist for HA Trazodone 50-100mg  po qHS prn insomnia  Pt reports she has been on this combination for over 2 yrs and is stable. Pt appears to be in need of treatment with 2 antipsychotics as she denies manic episodes on this combination. Benefit outweighs the risk and pt denies SE.   Routine PRN Medications: No  Consultations: encouraged to continue therapy with Gevena Barre  Safety Concerns: Pt denies SI and is at an acute low risk for suicide.Patient told to call clinic if any problems occur.  Patient advised to go to ER if they should develop SI/HI, side effects, or if symptoms worsen. Has crisis numbers to call if needed. Pt verbalized understanding.   Other: F/up in 3-4 months or sooner if needed

## 2016-10-21 ENCOUNTER — Other Ambulatory Visit (HOSPITAL_COMMUNITY): Payer: Self-pay | Admitting: Psychiatry

## 2016-10-21 DIAGNOSIS — F319 Bipolar disorder, unspecified: Secondary | ICD-10-CM

## 2017-01-17 ENCOUNTER — Ambulatory Visit (HOSPITAL_COMMUNITY): Payer: Self-pay | Admitting: Psychiatry

## 2017-01-20 ENCOUNTER — Other Ambulatory Visit (HOSPITAL_COMMUNITY): Payer: Self-pay | Admitting: Psychiatry

## 2017-01-20 DIAGNOSIS — F99 Mental disorder, not otherwise specified: Principal | ICD-10-CM

## 2017-01-20 DIAGNOSIS — F5105 Insomnia due to other mental disorder: Secondary | ICD-10-CM

## 2017-02-28 ENCOUNTER — Encounter (HOSPITAL_COMMUNITY): Payer: Self-pay | Admitting: Psychiatry

## 2017-02-28 ENCOUNTER — Ambulatory Visit (INDEPENDENT_AMBULATORY_CARE_PROVIDER_SITE_OTHER): Payer: 59 | Admitting: Psychiatry

## 2017-02-28 VITALS — BP 128/76 | HR 74 | Ht 70.0 in | Wt 237.4 lb

## 2017-02-28 DIAGNOSIS — G4700 Insomnia, unspecified: Secondary | ICD-10-CM | POA: Diagnosis not present

## 2017-02-28 DIAGNOSIS — Z818 Family history of other mental and behavioral disorders: Secondary | ICD-10-CM | POA: Diagnosis not present

## 2017-02-28 DIAGNOSIS — Z79899 Other long term (current) drug therapy: Secondary | ICD-10-CM

## 2017-02-28 DIAGNOSIS — F319 Bipolar disorder, unspecified: Secondary | ICD-10-CM

## 2017-02-28 MED ORDER — QUETIAPINE FUMARATE ER 200 MG PO TB24: 200.0000 mg | ORAL_TABLET | Freq: Every day | ORAL | 3 refills | Status: DC

## 2017-02-28 MED ORDER — LAMOTRIGINE 100 MG PO TABS: ORAL_TABLET | ORAL | 3 refills | Status: DC

## 2017-02-28 MED ORDER — LATUDA 40 MG PO TABS: 40.0000 mg | ORAL_TABLET | Freq: Every day | ORAL | 3 refills | Status: DC

## 2017-02-28 NOTE — Progress Notes (Signed)
BH MD/PA/NP OP Progress Note  02/28/2017 4:05 PM Robin [name]  MRN:  [id number]  Chief Complaint:  Chief Complaint    Follow-up      HPI: Pt states overall she is doing well.   She is wondering if Seroquel is too strong. She feels groggy, confused and tired after waking up. It takes about 3 hrs for the symptoms to resolve. She was able to get an old script of Seroquel ER 200mg  and states it was better.  Pt is working at the [employer] She is sleeping well due to physical labor. She is getting about 5-6 hrs/night.   Her brother passed away unexpectdly last week. She is feeling a lot of grief and wants therapy.  Overall pt denies depression. She denies anhedonia and hopelessness. Pt denise SI/HI/AVH.  Denies manic and hypomanic symptoms including periods of decreased need for sleep, increased energy, mood lability, impulsivity, FOI, and excessive spending.  Taking meds as prescribed and denies SE.    Visit Diagnosis:    ICD-10-CM   1. Encounter for long-term (current) use of medications Z79.899 CBC    TSH    Lipid panel    Comprehensive metabolic panel    Hemoglobin A1c    Prolactin  2. Bipolar I disorder (HCC) F31.9 QUEtiapine (SEROQUEL XR) 200 MG 24 hr tablet    LATUDA 40 MG TABS tablet    lamoTRIgine (LAMICTAL) 100 MG tablet    Past Psychiatric History:  Diagnosis: Bipolar d/o, Borderline PD  Hospitalizations: denies  Outpatient Care: [facility] in [place]- Dr. Charlean Merl; Yetta Barre  Substance Abuse Care: denies  Self-Mutilation: denies  Suicidal Attempts: denies, has access to guns- they are locked up  Violent Behaviors: denies     Past Medical History:  Past Medical History:  Diagnosis Date  . Bipolar disorder ([place])   . HA (headache)   . History of borderline personality disorder   . Hypersomnia, persistent 04/29/2013  . Memory loss, short term    [name] 04-03-13   . Migraine   . Seasonal allergies     Past Surgical History:  Procedure  Laterality Date  . ABDOMINAL HYSTERECTOMY  00  . KNEE SURGERY Right 96,98,00,02,05,2014  . NASAL SINUS SURGERY    . NOSE SURGERY     [phone]  . WRIST SURGERY Right [phone]    Family Psychiatric History:  Family History  Problem Relation Age of Onset  . Depression Mother   . High blood pressure Maternal Grandfather   . Breast cancer Maternal Grandmother   . Diabetes Maternal Grandmother   . High blood pressure Maternal Grandmother   . Breast cancer Sister   . Schizophrenia Brother     Social History:  Social History   Social History  . Marital status: Married    Spouse name: [name]  . Number of children: 2  . Years of education: AA   Occupational History  .  [employer]   Social History Main Topics  . Smoking status: Never Smoker  . Smokeless tobacco: Never Used  . Alcohol use 6.0 oz/week    12 Standard drinks or equivalent per week     Comment: 1-2 monthly   . Drug use: No  . Sexual activity: Not on file   Other Topics Concern  . Not on file   Social History Narrative   Pneumonia vaccine 2005   Patient lives at home with wife and 2 sons.    Patient works in [place].  Patient has her AA             Allergies:  Allergies  Allergen Reactions  . Aspirin Nausea And Vomiting and Rash  . Bee Venom Anaphylaxis  . Penicillins Anaphylaxis  . Bactrim [Sulfamethoxazole-Trimethoprim] Nausea And Vomiting  . Sulfa Antibiotics Nausea And Vomiting    Metabolic Disorder Labs: Lab Results  Component Value Date   HGBA1C 5.7 (H) 12/08/2014   MPG 117 (H) 12/08/2014   Lab Results  Component Value Date   PROLACTIN 11.4 12/08/2014   Lab Results  Component Value Date   CHOL 174 12/08/2014   TRIG 97 12/08/2014   HDL 35 (L) 12/08/2014   CHOLHDL 5.0 12/08/2014   VLDL 19 12/08/2014   LDLCALC 120 (H) 12/08/2014     Current Medications: Current Outpatient Prescriptions  Medication Sig Dispense Refill  .  Armodafinil 250 MG tablet Take 250 mg by mouth daily. Reported on 01/31/2016    . dicyclomine (BENTYL) 20 MG tablet Take 1 tablet (20 mg total) by mouth every 6 (six) hours as needed for spasms (for abdominal cramping). 20 tablet 0  . hydrocortisone (ANUSOL-HC) 25 MG suppository Place 1 suppository rectally 2 (two) times daily as needed for hemorrhoids. Reported on 11/03/2015  0  . lamoTRIgine (LAMICTAL) 100 MG tablet TAKE 1 TABLET (100 MG TOTAL) BY MOUTH DAILY. 30 tablet 3  . LATUDA 40 MG TABS tablet Take 1 tablet (40 mg total) by mouth daily with breakfast. 30 tablet 3  . nortriptyline (PAMELOR) 25 MG capsule Take 25 mg by mouth at bedtime.  5  . ondansetron (ZOFRAN ODT) 8 MG disintegrating tablet Take 1 tablet (8 mg total) by mouth every 8 (eight) hours as needed for nausea or vomiting. 20 tablet 0  . QUEtiapine (SEROQUEL XR) 300 MG 24 hr tablet Take 1 tablet (300 mg total) by mouth at bedtime. 30 tablet 3  . SUMAtriptan (IMITREX) 100 MG tablet Take 50-100 mg by mouth as directed. at onset of headache and may repeat once in 2 hours if needed  11  . traZODone (DESYREL) 50 MG tablet Take 50-100mg  po qHS prn insomnia 60 tablet 1  . traZODone (DESYREL) 50 MG tablet TAKE 1 TO 2 TABLETS BY MOUTH AT BEDTIME AS NEEDED INSOMNIA 60 tablet 1   No current facility-administered medications for this visit.      Musculoskeletal: Strength & Muscle Tone: within normal limits Gait & Station: normal Patient leans: N/A  Psychiatric Specialty Exam: Review of Systems  Musculoskeletal: Positive for joint pain. Negative for back pain and neck pain.  Neurological: Negative for dizziness, tingling, sensory change and headaches.  Psychiatric/Behavioral: Negative for depression, hallucinations, substance abuse and suicidal ideas. The patient is not nervous/anxious and does not have insomnia.     Blood pressure 128/76, pulse 74, height 5\' 10"  (1.778 m), weight 237 lb 6.4 oz (107.7 kg).Body mass index is 34.06 kg/m.   General Appearance: Casual  Eye Contact:  Good  Speech:  Clear and Coherent and Normal Rate  Volume:  Normal  Mood:  Euthymic  Affect:  Full Range and Tearful  Thought Process:  Goal Directed and Descriptions of Associations: Intact  Orientation:  Full (Time, Place, and Person)  Thought Content: Logical   Suicidal Thoughts:  No  Homicidal Thoughts:  No  Memory:  Immediate;   Fair Recent;   Fair Remote;   Fair  Judgement:  Good  Insight:  Good  Psychomotor Activity:  Normal  Concentration:  Concentration: Good and Attention  Span: Fair  Recall:  [place] of Knowledge: NA  Language: Good  Akathisia:  No  Handed:  Right  AIMS (if indicated):  AIMS:  Facial and Oral Movements  Muscles of Facial Expression: None, normal  Lips and Perioral Area: None, normal  Jaw: None, normal  Tongue: None, normal Extremity Movements: Upper (arms, wrists, hands, fingers): None, normal  Lower (legs, knees, ankles, toes): None, normal,  Trunk Movements:  Neck, shoulders, hips: None, normal,  Overall Severity : Severity of abnormal movements (highest score from questions above): None, normal  Incapacitation due to abnormal movements: None, normal  Patient's awareness of abnormal movements (rate only patient's report): No Awareness, Dental Status  Current problems with teeth and/or dentures?: No  Does patient usually wear dentures?: No     Assets:  Communication Skills Desire for [place] Talents/Skills Transportation Vocational/Educational  ADL's:  Intact  Cognition: WNL  Sleep:  good     Treatment Plan Summary:Medication management  Assessment: Bipolar I disorder; Insomnia; Borderline PD   Medication management with supportive therapy. Risks/benefits and SE of the medication discussed. Pt verbalized understanding and verbal consent obtained for treatment.  Affirm with the patient that the medications are taken as ordered. Patient expressed  understanding of how their medications were to be used.   Meds:Lamcital 100mg  po qD for Bipolar disorder Latuda 40mg  po qD for Bipolar disorder Decrease Seroquel XR 200mg  po qHS for Bipolar disorder and sleep Trazodone 50-100mg  po qHS prn insomnia- no script today Nortriptyline prescribed by neurologist for HA Pt reports she has been on this combination for over 2 yrs and is stable. Pt appears to be in need of treatment with 2 antipsychotics as she denies manic episodes on this combination. Benefit outweighs the risk and pt denies SE.   Labs:ordered CBC, CMP, HbA1c, Lipid panel, TSH, Prolactin level, EKG   Therapy: brief supportive therapy provided. Discussed psychosocial stressors in detail.     Consultations:refered for grief counseling  Pt denies SI and is at an acute low risk for suicide. Patient told to call clinic if any problems occur. Patient advised to go to ER if they should develop SI/HI, side effects, or if symptoms worsen. Has crisis numbers to call if needed. Pt verbalized understanding.  F/up in 3 months or sooner if needed   Charlcie Cradle, MD 02/28/2017, 4:05 PM

## 2017-03-19 LAB — COMPREHENSIVE METABOLIC PANEL
ALBUMIN: 4 g/dL (ref 3.6–5.1)
ALK PHOS: 44 U/L (ref 33–130)
ALT: 22 U/L (ref 6–29)
AST: 19 U/L (ref 10–35)
BILIRUBIN TOTAL: 0.4 mg/dL (ref 0.2–1.2)
BUN: 9 mg/dL (ref 7–25)
CALCIUM: 9.2 mg/dL (ref 8.6–10.4)
CO2: 25 mmol/L (ref 20–31)
CREATININE: 1.14 mg/dL — AB (ref 0.50–1.05)
Chloride: 107 mmol/L (ref 98–110)
Glucose, Bld: 84 mg/dL (ref 65–99)
Potassium: 4.5 mmol/L (ref 3.5–5.3)
SODIUM: 141 mmol/L (ref 135–146)
TOTAL PROTEIN: 6.6 g/dL (ref 6.1–8.1)

## 2017-03-19 LAB — TSH: TSH: 1.51 m[IU]/L

## 2017-03-19 LAB — CBC
HEMATOCRIT: 41.1 % (ref 35.0–45.0)
Hemoglobin: 12.9 g/dL (ref 11.7–15.5)
MCH: 27.8 pg (ref 27.0–33.0)
MCHC: 31.4 g/dL — AB (ref 32.0–36.0)
MCV: 88.6 fL (ref 80.0–100.0)
MPV: 12 fL (ref 7.5–12.5)
PLATELETS: 265 10*3/uL (ref 140–400)
RBC: 4.64 MIL/uL (ref 3.80–5.10)
RDW: 14.2 % (ref 11.0–15.0)
WBC: 7.3 10*3/uL (ref 3.8–10.8)

## 2017-03-19 LAB — LIPID PANEL
CHOL/HDL RATIO: 4.3 ratio (ref <5.0)
CHOLESTEROL: 169 mg/dL (ref <200)
HDL: 39 mg/dL — ABNORMAL LOW (ref >50)
LDL Cholesterol: 102 mg/dL — ABNORMAL HIGH (ref <100)
Triglycerides: 140 mg/dL (ref <150)
VLDL: 28 mg/dL (ref <30)

## 2017-03-19 LAB — HEMOGLOBIN A1C
HEMOGLOBIN A1C: 5.2 % (ref <5.7)
MEAN PLASMA GLUCOSE: 103 mg/dL

## 2017-03-19 LAB — PROLACTIN: PROLACTIN: 12.1 ng/mL

## 2017-04-01 ENCOUNTER — Ambulatory Visit (INDEPENDENT_AMBULATORY_CARE_PROVIDER_SITE_OTHER): Payer: 59

## 2017-04-01 ENCOUNTER — Ambulatory Visit (INDEPENDENT_AMBULATORY_CARE_PROVIDER_SITE_OTHER): Payer: 59 | Admitting: Family Medicine

## 2017-04-01 ENCOUNTER — Encounter: Payer: Self-pay | Admitting: Family Medicine

## 2017-04-01 DIAGNOSIS — M545 Low back pain, unspecified: Secondary | ICD-10-CM | POA: Insufficient documentation

## 2017-04-01 DIAGNOSIS — M5441 Lumbago with sciatica, right side: Secondary | ICD-10-CM

## 2017-04-01 DIAGNOSIS — K259 Gastric ulcer, unspecified as acute or chronic, without hemorrhage or perforation: Secondary | ICD-10-CM | POA: Insufficient documentation

## 2017-04-01 DIAGNOSIS — F319 Bipolar disorder, unspecified: Secondary | ICD-10-CM | POA: Diagnosis not present

## 2017-04-01 MED ORDER — KETOROLAC TROMETHAMINE 60 MG/2ML IM SOLN
60.0000 mg | Freq: Once | INTRAMUSCULAR | Status: AC
  Administered 2017-04-01: 60 mg via INTRAMUSCULAR

## 2017-04-01 MED ORDER — HYDROCODONE-ACETAMINOPHEN 5-325 MG PO TABS: 1.0000 | ORAL_TABLET | Freq: Four times a day (QID) | ORAL | 0 refills | Status: DC | PRN

## 2017-04-01 NOTE — Progress Notes (Signed)
Robin [name] is a 51 y.o. female is here to [place].   Patient Care Team: Briscoe Deutscher, DO as PCP - General (Family Medicine)   History of Present Illness:   Robin [name], CMA, acting as scribe for Dr. Juleen China.  HPI:  Patient is here today to establish care.  She is having some right-sided back, shoulder, and leg pain.  This began yesterday while she was moving some appliances into her apartment.  States she feels some tingling in her right leg as well.  She has tried some heat and ice and tried some Aleve with no relief.  States she had to sleep sitting up last night because lying down causes a burning sensation down the back of her right leg.  Patient becomes tearful when palpating in areas on the back.  Patient with bipolar I disorder.  Patient is followed by psychiatry and feels she is stable on her medications.  She states she will continue to follow with psych for medication management.  States that she was very prone to manic episodes.  States that in the past she would disappear from home for several days and would spend a lot of money.  She hasn't done this in quite some time.    Health Maintenance Due  Topic Date Due  . TETANUS/TDAP  06/05/1984  . INFLUENZA VACCINE  03/27/2017   PMHx, SurgHx, SocialHx, Medications, and Allergies were reviewed in the Visit Navigator and updated as appropriate.   Past Medical History:  Diagnosis Date  . Asthma   . Bipolar disorder ([place])   . HA (headache)   . History of borderline personality disorder   . Hypersomnia, persistent 04/29/2013  . Memory loss, short term   . Migraine   . Seasonal allergies    Past Surgical History:  Procedure Laterality Date  . ABDOMINAL HYSTERECTOMY  00  . KNEE SURGERY Right 96,98,00,02,05,2014  . NASAL SINUS SURGERY    . NOSE SURGERY    . WRIST SURGERY Right [phone]   Family History  Problem Relation Age of Onset  . Depression Mother   . High blood pressure Maternal Grandfather   . Breast  cancer Maternal Grandmother   . Diabetes Maternal Grandmother   . High blood pressure Maternal Grandmother   . Breast cancer Sister   . Schizophrenia Brother    Social History  Substance Use Topics  . Smoking status: Never Smoker  . Smokeless tobacco: Never Used  . Alcohol use No   Current Medications and Allergies:   .  Armodafinil (NUVIGIL) 250 MG tablet, Take 250 mg by mouth daily., Disp: , Rfl:  .  dicyclomine (BENTYL) 20 MG tablet, Take 1 tablet (20 mg total) by mouth every 6 (six) hours as needed for spasms (for abdominal cramping)., Disp: 20 tablet, Rfl: 0 .  lamoTRIgine (LAMICTAL) 100 MG tablet, TAKE 1 TABLET (100 MG TOTAL) BY MOUTH DAILY., Disp: 30 tablet, Rfl: 3 .  LATUDA 40 MG TABS tablet, Take 1 tablet (40 mg total) by mouth daily with breakfast., Disp: 30 tablet, Rfl: 3 .  ondansetron (ZOFRAN ODT) 8 MG disintegrating tablet, Take 1 tablet (8 mg total) by mouth every 8 (eight) hours as needed for nausea or vomiting., Disp: 20 tablet, Rfl: 0 .  QUEtiapine (SEROQUEL XR) 200 MG 24 hr tablet, Take 1 tablet (200 mg total) by mouth at bedtime., Disp: 30 tablet, Rfl: 3 .  SUMAtriptan (IMITREX) 100 MG tablet, Take 50-100 mg by mouth as directed. at onset of headache  and may repeat once in 2 hours if needed, Disp: , Rfl: 11  Allergies  Allergen Reactions  . Aspirin Nausea And Vomiting and Rash  . Bee Venom Anaphylaxis  . Penicillins Anaphylaxis  . Bactrim [Sulfamethoxazole-Trimethoprim] Nausea And Vomiting  . Sulfa Antibiotics Nausea And Vomiting   Review of Systems:   Pertinent items are noted in the HPI. Otherwise, ROS is negative.  Vitals:   Vitals:   04/01/17 1448  BP: 126/80  Pulse: 77  Temp: 98.4 F (36.9 C)  TempSrc: Oral  SpO2: 97%  Weight: 227 lb (103 kg)  Height: 5\' 10"  (1.778 m)     Body mass index is 32.57 kg/m.  Physical Exam:   Physical Exam  Constitutional: She appears well-nourished.  HENT:  Head: Normocephalic and atraumatic.  Eyes: Pupils  are equal, round, and reactive to light. EOM are normal.  Neck: Normal range of motion. Neck supple.  Cardiovascular: Normal rate, regular rhythm, normal heart sounds and intact distal pulses.   Pulmonary/Chest: Effort normal.  Abdominal: Soft.  Musculoskeletal:       Back:  Skin: Skin is warm.  Psychiatric: She has a normal mood and affect. Her behavior is normal.  Nursing note and vitals reviewed.  EXAM: LUMBAR SPINE - 2-3 VIEW  COMPARISON:  None.  FINDINGS: Normal alignment of the lumbar spine without significant disc space narrowing.  Minimal anterior spurring L2, L3, L4 and L5.  No fracture noted.  IMPRESSION: Normal alignment of the lumbar spine without significant disc space narrowing.  Assessment and Plan:   [name] was seen today for establish care.  Diagnoses and all orders for this visit:  Acute right-sided low back pain with right-sided sciatica Comments: Treatment as below. Reviewed red flags and discussed precautions. Recommended gently stretching. If not having significant improvement in the next few days, patient to call. Continue NSAIDs.  Orders: -     DG Lumbar Spine 2-3 Views; Future -     HYDROcodone-acetaminophen (NORCO/VICODIN) 5-325 MG tablet; Take 1 tablet by mouth every 6 (six) hours as needed for moderate pain. -     ketorolac (TORADOL) injection 60 mg; Inject 2 mLs (60 mg total) into the muscle once.  Bipolar I disorder ([place]) Comments: Followed by [facility]. Stable for over 3 years.   . Reviewed expectations re: course of current medical issues. . Discussed self-management of symptoms. . Outlined signs and symptoms indicating need for more acute intervention. . Patient verbalized understanding and all questions were answered. Marland Kitchen Health Maintenance issues including appropriate healthy diet, exercise, and smoking avoidance were discussed with patient. . See orders for this visit as documented in the electronic medical record. . Patient  received an After Visit Summary.  CMA served as Education administrator during this visit. History, Physical, and Plan performed by medical provider. The above documentation has been reviewed and is accurate and complete. Briscoe Deutscher, D.O.  Briscoe Deutscher, DO [place], [place] 04/01/2017  Future Appointments Date Time Provider [place]  05/16/2017 4:00 PM Charlcie Cradle, MD [facility] None

## 2017-04-01 NOTE — Patient Instructions (Addendum)
Use ice - not heat - for your back.  Try to rest as much as possible.  Be careful when taking pain medications.  Call if you do not feel better!

## 2017-04-08 ENCOUNTER — Telehealth: Payer: Self-pay | Admitting: Family Medicine

## 2017-04-08 NOTE — Telephone Encounter (Signed)
Please advise.  Patient requesting medication that is not as strong and also a work note.

## 2017-04-08 NOTE — Telephone Encounter (Signed)
Okay work note. Since still in pain, make appt with Paulla Fore. Okay to cut Norco in half.

## 2017-04-08 NOTE — Telephone Encounter (Signed)
Letter written, signed, placed up at front for pick up.  Patient notified.  Also will make appointment with Dr. Paulla Fore and will try cutting Norco in half.  Will call back if half Norco is still too strong.

## 2017-04-08 NOTE — Telephone Encounter (Signed)
MEDICATION: HYDROcodone-acetaminophen (NORCO/VICODIN) 5-325 MG tablet [[id number]]    PHARMACY:  [facility] [address]    IS THIS A 90 DAY SUPPLY : no  IS PATIENT OUT OF MEDICTAION: no  IF NOT; HOW MUCH IS LEFT: n/a  LAST APPOINTMENT DATE:04/01/17  NEXT APPOINTMENT DATE:n/a  OTHER COMMENTS:  Patient is requesting a medication that is less strong. The vicodin is effective for her back pain, but it makes her sleepy. She is also requesting a work note backdating 04/04/2017 to 04/10/2017, returning on 04/11/2017.   **Let patient know to contact pharmacy at the end of the day to make sure medication is ready. **  ** Please notify patient to allow 48-72 hours to process**  **Encourage patient to contact the pharmacy for refills or they can request refills through [facility]**

## 2017-04-11 ENCOUNTER — Telehealth: Payer: Self-pay | Admitting: Family Medicine

## 2017-04-11 NOTE — Telephone Encounter (Signed)
ROI fax to [facility]

## 2017-04-30 ENCOUNTER — Encounter: Payer: Self-pay | Admitting: Family Medicine

## 2017-05-01 ENCOUNTER — Encounter: Payer: Self-pay | Admitting: Family Medicine

## 2017-05-01 DIAGNOSIS — K5790 Diverticulosis of intestine, part unspecified, without perforation or abscess without bleeding: Secondary | ICD-10-CM | POA: Insufficient documentation

## 2017-05-16 ENCOUNTER — Ambulatory Visit (HOSPITAL_COMMUNITY): Payer: 59 | Admitting: Psychiatry

## 2017-05-27 ENCOUNTER — Telehealth: Payer: Self-pay | Admitting: *Deleted

## 2017-05-27 NOTE — Telephone Encounter (Signed)
Patient called and states she would like a referral to a neurologist. Patient states you can call her if you have any questions. Her contact number is [phone]. Please advise. Thank you

## 2017-05-28 ENCOUNTER — Other Ambulatory Visit: Payer: Self-pay

## 2017-05-28 ENCOUNTER — Encounter (HOSPITAL_COMMUNITY): Payer: Self-pay

## 2017-05-28 ENCOUNTER — Emergency Department (HOSPITAL_COMMUNITY)
Admission: EM | Admit: 2017-05-28 | Discharge: 2017-05-29 | Disposition: A | Discharge status: Home / Self Care | Payer: 59 | Attending: Emergency Medicine | Admitting: Emergency Medicine

## 2017-05-28 DIAGNOSIS — Y999 Unspecified external cause status: Secondary | ICD-10-CM | POA: Insufficient documentation

## 2017-05-28 DIAGNOSIS — Y9389 Activity, other specified: Secondary | ICD-10-CM | POA: Diagnosis not present

## 2017-05-28 DIAGNOSIS — G479 Sleep disorder, unspecified: Secondary | ICD-10-CM

## 2017-05-28 DIAGNOSIS — Y929 Unspecified place or not applicable: Secondary | ICD-10-CM | POA: Diagnosis not present

## 2017-05-28 DIAGNOSIS — X58XXXA Exposure to other specified factors, initial encounter: Secondary | ICD-10-CM | POA: Diagnosis not present

## 2017-05-28 DIAGNOSIS — Z79899 Other long term (current) drug therapy: Secondary | ICD-10-CM | POA: Insufficient documentation

## 2017-05-28 DIAGNOSIS — N289 Disorder of kidney and ureter, unspecified: Secondary | ICD-10-CM | POA: Diagnosis not present

## 2017-05-28 DIAGNOSIS — Z7983 Long term (current) use of bisphosphonates: Secondary | ICD-10-CM | POA: Insufficient documentation

## 2017-05-28 DIAGNOSIS — J45909 Unspecified asthma, uncomplicated: Secondary | ICD-10-CM | POA: Insufficient documentation

## 2017-05-28 DIAGNOSIS — S40011A Contusion of right shoulder, initial encounter: Secondary | ICD-10-CM | POA: Insufficient documentation

## 2017-05-28 DIAGNOSIS — R519 Headache, unspecified: Secondary | ICD-10-CM

## 2017-05-28 DIAGNOSIS — R0781 Pleurodynia: Secondary | ICD-10-CM | POA: Diagnosis not present

## 2017-05-28 DIAGNOSIS — F0781 Postconcussional syndrome: Secondary | ICD-10-CM

## 2017-05-28 DIAGNOSIS — G8929 Other chronic pain: Secondary | ICD-10-CM

## 2017-05-28 DIAGNOSIS — R55 Syncope and collapse: Secondary | ICD-10-CM

## 2017-05-28 DIAGNOSIS — R51 Headache: Principal | ICD-10-CM

## 2017-05-28 LAB — BASIC METABOLIC PANEL
Anion gap: 5 (ref 5–15)
BUN: 15 mg/dL (ref 6–20)
CO2: 27 mmol/L (ref 22–32)
CREATININE: 1.28 mg/dL — AB (ref 0.44–1.00)
Calcium: 9.2 mg/dL (ref 8.9–10.3)
Chloride: 108 mmol/L (ref 101–111)
GFR, EST AFRICAN AMERICAN: 55 mL/min — AB (ref >60)
GFR, EST NON AFRICAN AMERICAN: 48 mL/min — AB (ref >60)
Glucose, Bld: 91 mg/dL (ref 65–99)
Potassium: 3.9 mmol/L (ref 3.5–5.1)
SODIUM: 140 mmol/L (ref 135–145)

## 2017-05-28 LAB — CBC
HCT: 38.5 % (ref 36.0–46.0)
Hemoglobin: 12.4 g/dL (ref 12.0–15.0)
MCH: 27.9 pg (ref 26.0–34.0)
MCHC: 32.2 g/dL (ref 30.0–36.0)
MCV: 86.7 fL (ref 78.0–100.0)
PLATELETS: 255 10*3/uL (ref 150–400)
RBC: 4.44 MIL/uL (ref 3.87–5.11)
RDW: 13.7 % (ref 11.5–15.5)
WBC: 9.9 10*3/uL (ref 4.0–10.5)

## 2017-05-28 NOTE — ED Triage Notes (Signed)
Pt. States she is feeling foggy and confused ever since she passed out September 25th and since then getting worse with n/v and Diarrhea. Pt. States the right side of her head and neck was sore from the fall. Last time she vomited was Sunday and has felt nauseas, dizzy, and had diarrhea today.

## 2017-05-28 NOTE — Telephone Encounter (Signed)
Patient was previously seeing a neurologist for headaches and sleep disorder.  That neurologist left the practice and there is not another neurologist at that practice.  She would like a referral to a new neurologist to continue her neurologic care.

## 2017-05-28 NOTE — Telephone Encounter (Signed)
Okay referral.

## 2017-05-28 NOTE — Telephone Encounter (Signed)
More info please.  

## 2017-05-28 NOTE — Telephone Encounter (Signed)
Please advise 

## 2017-05-29 ENCOUNTER — Emergency Department (HOSPITAL_COMMUNITY): Payer: 59

## 2017-05-29 DIAGNOSIS — R0781 Pleurodynia: Secondary | ICD-10-CM | POA: Diagnosis not present

## 2017-05-29 LAB — HEPATIC FUNCTION PANEL
ALBUMIN: 3.7 g/dL (ref 3.5–5.0)
ALT: 16 U/L (ref 14–54)
AST: 19 U/L (ref 15–41)
Alkaline Phosphatase: 47 U/L (ref 38–126)
Bilirubin, Direct: <0.1 mg/dL — ABNORMAL LOW (ref 0.1–0.5)
TOTAL PROTEIN: 7 g/dL (ref 6.5–8.1)
Total Bilirubin: 0.3 mg/dL (ref 0.3–1.2)

## 2017-05-29 LAB — URINALYSIS, ROUTINE W REFLEX MICROSCOPIC
Bilirubin Urine: NEGATIVE
GLUCOSE, UA: NEGATIVE mg/dL
Hgb urine dipstick: NEGATIVE
Ketones, ur: NEGATIVE mg/dL
Leukocytes, UA: NEGATIVE
NITRITE: NEGATIVE
PH: 5 (ref 5.0–8.0)
Protein, ur: NEGATIVE mg/dL
SPECIFIC GRAVITY, URINE: 1.023 (ref 1.005–1.030)

## 2017-05-29 LAB — CBG MONITORING, ED: Glucose-Capillary: 86 mg/dL (ref 65–99)

## 2017-05-29 LAB — DIFFERENTIAL
BASOS ABS: 0 10*3/uL (ref 0.0–0.1)
BASOS PCT: 0 %
Eosinophils Absolute: 0.2 10*3/uL (ref 0.0–0.7)
Eosinophils Relative: 2 %
LYMPHS ABS: 4 10*3/uL (ref 0.7–4.0)
LYMPHS PCT: 40 %
MONOS PCT: 7 %
Monocytes Absolute: 0.7 10*3/uL (ref 0.1–1.0)
NEUTROS ABS: 5.2 10*3/uL (ref 1.7–7.7)
Neutrophils Relative %: 51 %

## 2017-05-29 NOTE — Discharge Instructions (Signed)
Make an appointment with your primary care provider for additional tests regarding your passing out. You may take acetaminophen for pain.  Concussion symptoms usually get better within a few weeks or months. Consider following up with the neurologist.

## 2017-05-29 NOTE — ED Provider Notes (Signed)
[facility] DEPT Provider Note   CSN: [id number] Arrival date & time: 05/28/17  2019     History   Chief Complaint Chief Complaint  Patient presents with  . Loss of Consciousness  . Fall    HPI Robin [name] is a 51 y.o. female.  The history is provided by the patient.  she had a syncopal episode on September 21. Since then, she has had periods of confusion and has felt foggy. Spouse is noted some similar changes. She has had an intermittent headache and is also complaining of pain in the right shoulder. She puts pain at 5/10. She denies any visual change, nausea, vomiting. She denies dizziness or incoordination. She denies weakness, numbness, tingling. She does have history of prior head trauma.  Past Medical History:  Diagnosis Date  . Asthma   . Bipolar disorder ([place])   . HA (headache)   . History of borderline personality disorder   . Hypersomnia, persistent 04/29/2013  . Memory loss, short term    [name] 04-03-13   . Migraine   . Seasonal allergies     Patient Active Problem List   Diagnosis Date Noted  . Diverticulosis 05/01/2017  . Low back pain 04/01/2017  . Stomach ulcer 04/01/2017  . Narcolepsy due to underlying condition without cataplexy 07/18/2016  . Chronic pansinusitis 01/27/2016  . History of colonoscopy with polypectomy 04/14/2015  . Bipolar I disorder, most recent episode (or current) unspecified 11/23/2014  . Obesity 12/23/2013  . Sleep apnea 11/25/2013  . Hypersomnia, persistent 04/29/2013  . Memory loss, short term   . Cognitive decline 04/03/2013  . Depression 04/03/2013    Past Surgical History:  Procedure Laterality Date  . ABDOMINAL HYSTERECTOMY  00  . KNEE SURGERY Right 96,98,00,02,05,2014  . NASAL SINUS SURGERY    . NOSE SURGERY     [id number]  . WRIST SURGERY Right [phone]    OB History    No data available       Home Medications    Prior to Admission medications   Medication Sig Start Date End Date Taking?  Authorizing Provider  Armodafinil (NUVIGIL) 250 MG tablet Take 250 mg by mouth daily.   Yes [provider]  lamoTRIgine (LAMICTAL) 100 MG tablet TAKE 1 TABLET (100 MG TOTAL) BY MOUTH DAILY. 02/28/17  Yes Charlcie Cradle, MD  LATUDA 40 MG TABS tablet Take 1 tablet (40 mg total) by mouth daily with breakfast. 02/28/17  Yes Charlcie Cradle, MD  ondansetron (ZOFRAN ODT) 8 MG disintegrating tablet Take 1 tablet (8 mg total) by mouth every 8 (eight) hours as needed for nausea or vomiting. 02/08/15  Yes Linton Flemings, MD  QUEtiapine (SEROQUEL XR) 200 MG 24 hr tablet Take 1 tablet (200 mg total) by mouth at bedtime. 02/28/17  Yes Charlcie Cradle, MD  SUMAtriptan (IMITREX) 100 MG tablet Take 50-100 mg by mouth as directed. at onset of headache and may repeat once in 2 hours if needed 12/31/14  Yes [provider]    Family History Family History  Problem Relation Age of Onset  . Depression Mother   . Heart disease Mother   . High Cholesterol Mother   . Hypertension Mother   . Heart disease Father   . High blood pressure Maternal Grandfather   . Breast cancer Maternal Grandmother   . Diabetes Maternal Grandmother   . High blood pressure Maternal Grandmother   . Breast cancer Sister   . Schizophrenia Brother     Social History Social History  Substance Use Topics  . Smoking status: Never Smoker  . Smokeless tobacco: Never Used  . Alcohol use No     Allergies   Aspirin; Bee venom; Penicillins; Bactrim [sulfamethoxazole-trimethoprim]; and Sulfa antibiotics   Review of Systems Review of Systems  All other systems reviewed and are negative.    Physical Exam Updated Vital Signs BP 130/73 (BP Location: Left Arm)   Pulse 74   Temp 98.2 F (36.8 C) (Oral)   Resp 18   Ht 5\' 10"  (1.778 m)   Wt 106.6 kg (235 lb)   SpO2 96%   BMI 33.72 kg/m   Physical Exam  Nursing note and vitals reviewed.  51 year old female, resting comfortably and in no acute distress. Vital signs are  normal. Oxygen saturation is 96%, which is normal. Head is normocephalic and atraumatic. PERRLA, EOMI. Oropharynx is clear. Neck is nontender and supple without adenopathy or JVD. Back is nontender and there is no CVA tenderness. Lungs are clear without rales, wheezes, or rhonchi. Chest is nontender. Heart has regular rate and rhythm without murmur. Abdomen is soft, flat, nontender without masses or hepatosplenomegaly and peristalsis is normoactive. Extremities have no cyanosis or edema, full range of motion is present. Skin is warm and dry without rash. Neurologic: Mental status is normal, cranial nerves are intact, there are no motor or sensory deficits.  ED Treatments / Results  Labs (all labs ordered are listed, but only abnormal results are displayed) Labs Reviewed  BASIC METABOLIC PANEL - Abnormal; Notable for the following:       Result Value   Creatinine, Ser 1.28 (*)    GFR calc non Af Amer 48 (*)    GFR calc Af Amer 55 (*)    All other components within normal limits  HEPATIC FUNCTION PANEL - Abnormal; Notable for the following:    Bilirubin, Direct <0.1 (*)    All other components within normal limits  CBC  URINALYSIS, ROUTINE W REFLEX MICROSCOPIC  DIFFERENTIAL  CBG MONITORING, ED    EKG  EKG Interpretation  Date/Time:  Tuesday May 28 2017 22:22:43 EDT Ventricular Rate:  62 PR Interval:    QRS Duration: 80 QT Interval:  406 QTC Calculation: 413 R Axis:   49 Text Interpretation:  Sinus rhythm RSR' in V1 or V2, probably normal variant Otherwise within normal limits When compared with ECG of 03/13/2016, No significant change was found Confirmed by Delora Fuel ([id number]) on 05/29/2017 12:15:03 AM       Radiology Dg Shoulder Right  Result Date: 05/29/2017 CLINICAL DATA:  Fall onto right shoulder 05/21/2017 with persistent pain. EXAM: RIGHT SHOULDER - 2+ VIEW COMPARISON:  None. FINDINGS: There is no evidence of fracture or dislocation. There is no evidence of  arthropathy or other focal bone abnormality. Soft tissues are unremarkable. IMPRESSION: Negative. Electronically Signed   By: Marin Olp M.D.   On: 05/29/2017 00:54   Ct Head Wo Contrast  Result Date: 05/29/2017 CLINICAL DATA:  Feeling unwell since syncopal episode May 21, 2017. RIGHT head and neck soreness. History of migraines. EXAM: CT HEAD WITHOUT CONTRAST CT CERVICAL SPINE WITHOUT CONTRAST TECHNIQUE: Multidetector CT imaging of the head and cervical spine was performed following the standard protocol without intravenous contrast. Multiplanar CT image reconstructions of the cervical spine were also generated. COMPARISON:  MRI of the head October 27, 2011 FINDINGS: CT HEAD FINDINGS BRAIN: No intraparenchymal hemorrhage, mass effect nor midline shift. The ventricles and sulci are normal. No acute large vascular territory  infarcts. Faint symmetric basal ganglia mineralization. No abnormal extra-axial fluid collections. Basal cisterns are patent. Extensive dural calcification anterior falx. VASCULAR: Unremarkable. SKULL/SOFT TISSUES: No skull fracture. No significant soft tissue swelling. ORBITS/SINUSES: The included ocular globes and orbital contents are normal.Status post FESS. Mastoid aircells and included paranasal sinuses are well-aerated. OTHER: Numerous subcentimeter bilateral parotid nodules most compatible with lymph nodes, incompletely characterized. Findings are new from 2013. CT CERVICAL SPINE FINDINGS ALIGNMENT: Straightened lordosis. Vertebral bodies in alignment. SKULL BASE AND VERTEBRAE: Cervical vertebral bodies and posterior elements are intact. Moderate C5-6 disc height loss with endplate spurring compatible with degenerative disc. No destructive bony lesions. C1-2 articulation maintained. SOFT TISSUES AND SPINAL CANAL: Nonacute.  Small RIGHT laryngocele. DISC LEVELS: C5-6 small disc osteophyte complex and, moderate LEFT central disc protrusion results in moderate canal stenosis. No  osseous neural foraminal narrowing. UPPER CHEST: Lung apices are clear. OTHER: None. IMPRESSION: CT HEAD: 1. Negative noncontrast CT HEAD for age. CT CERVICAL SPINE: 1. No acute fracture or malalignment. 2. Degenerative cervical spine resulting in moderate canal stenosis at C5-6. Electronically Signed   By: Elon Alas M.D.   On: 05/29/2017 01:06   Ct Cervical Spine Wo Contrast  Result Date: 05/29/2017 CLINICAL DATA:  Feeling unwell since syncopal episode May 21, 2017. RIGHT head and neck soreness. History of migraines. EXAM: CT HEAD WITHOUT CONTRAST CT CERVICAL SPINE WITHOUT CONTRAST TECHNIQUE: Multidetector CT imaging of the head and cervical spine was performed following the standard protocol without intravenous contrast. Multiplanar CT image reconstructions of the cervical spine were also generated. COMPARISON:  MRI of the head October 27, 2011 FINDINGS: CT HEAD FINDINGS BRAIN: No intraparenchymal hemorrhage, mass effect nor midline shift. The ventricles and sulci are normal. No acute large vascular territory infarcts. Faint symmetric basal ganglia mineralization. No abnormal extra-axial fluid collections. Basal cisterns are patent. Extensive dural calcification anterior falx. VASCULAR: Unremarkable. SKULL/SOFT TISSUES: No skull fracture. No significant soft tissue swelling. ORBITS/SINUSES: The included ocular globes and orbital contents are normal.Status post FESS. Mastoid aircells and included paranasal sinuses are well-aerated. OTHER: Numerous subcentimeter bilateral parotid nodules most compatible with lymph nodes, incompletely characterized. Findings are new from 2013. CT CERVICAL SPINE FINDINGS ALIGNMENT: Straightened lordosis. Vertebral bodies in alignment. SKULL BASE AND VERTEBRAE: Cervical vertebral bodies and posterior elements are intact. Moderate C5-6 disc height loss with endplate spurring compatible with degenerative disc. No destructive bony lesions. C1-2 articulation maintained. SOFT  TISSUES AND SPINAL CANAL: Nonacute.  Small RIGHT laryngocele. DISC LEVELS: C5-6 small disc osteophyte complex and, moderate LEFT central disc protrusion results in moderate canal stenosis. No osseous neural foraminal narrowing. UPPER CHEST: Lung apices are clear. OTHER: None. IMPRESSION: CT HEAD: 1. Negative noncontrast CT HEAD for age. CT CERVICAL SPINE: 1. No acute fracture or malalignment. 2. Degenerative cervical spine resulting in moderate canal stenosis at C5-6. Electronically Signed   By: Elon Alas M.D.   On: 05/29/2017 01:06    Procedures Procedures (including critical care time)  Medications Ordered in ED Medications - No data to display   Initial Impression / Assessment and Plan / ED Course  I have reviewed the triage vital signs and the nursing notes.  Pertinent labs & imaging results that were available during my care of the patient were reviewed by me and considered in my medical decision making (see chart for details).  Headache and mental status change post syncope with possible head trauma. This may be a postconcussion syndrome. We'll send for CT of head and also check screening  labs. Old records are reviewed, and she has no relevant past visits.  Laboratory workup shows mild elevation of creatinine which is not felt to be clinically significant. This will need to be followed as an outpatient. CT of head and cervical spine were unremarkable. At this point, symptoms are felt to be due to postconcussion syndrome. She still will need some additional evaluation for syncope to possibly include echocardiogram, carotid duplex scan. This can be obtained as an outpatient. No indication for admission since syncopal episode occurred nearly 2 weeks ago and she has had no further symptoms since then.  Final Clinical Impressions(s) / ED Diagnoses   Final diagnoses:  Post concussion syndrome  Syncope and collapse  Contusion of multiple sites of right shoulder, initial encounter    Renal insufficiency    New Prescriptions New Prescriptions   No medications on file     Delora Fuel, MD 47/09/62 [id number]

## 2017-06-03 ENCOUNTER — Ambulatory Visit: Payer: Self-pay | Admitting: Family Medicine

## 2017-06-12 ENCOUNTER — Ambulatory Visit (INDEPENDENT_AMBULATORY_CARE_PROVIDER_SITE_OTHER): Payer: 59 | Admitting: Family Medicine

## 2017-06-12 ENCOUNTER — Other Ambulatory Visit (INDEPENDENT_AMBULATORY_CARE_PROVIDER_SITE_OTHER): Payer: 59 | Admitting: *Deleted

## 2017-06-12 VITALS — Wt 224.0 lb

## 2017-06-12 DIAGNOSIS — R5383 Other fatigue: Secondary | ICD-10-CM

## 2017-06-12 DIAGNOSIS — F0781 Postconcussional syndrome: Secondary | ICD-10-CM | POA: Diagnosis not present

## 2017-06-12 DIAGNOSIS — Z23 Encounter for immunization: Secondary | ICD-10-CM

## 2017-06-12 DIAGNOSIS — E559 Vitamin D deficiency, unspecified: Secondary | ICD-10-CM

## 2017-06-12 DIAGNOSIS — R55 Syncope and collapse: Secondary | ICD-10-CM

## 2017-06-12 LAB — COMPREHENSIVE METABOLIC PANEL
ALT: 15 U/L (ref 0–35)
AST: 16 U/L (ref 0–37)
Albumin: 4 g/dL (ref 3.5–5.2)
Alkaline Phosphatase: 51 U/L (ref 39–117)
BUN: 10 mg/dL (ref 6–23)
CO2: 25 mEq/L (ref 19–32)
Calcium: 9.7 mg/dL (ref 8.4–10.5)
Chloride: 106 mEq/L (ref 96–112)
Creatinine, Ser: 1.03 mg/dL (ref 0.40–1.20)
GFR: 72.36 mL/min (ref >60.00)
Glucose, Bld: 107 mg/dL — ABNORMAL HIGH (ref 70–99)
Potassium: 4.3 mEq/L (ref 3.5–5.1)
Sodium: 142 mEq/L (ref 135–145)
Total Bilirubin: 0.6 mg/dL (ref 0.2–1.2)
Total Protein: 6.7 g/dL (ref 6.0–8.3)

## 2017-06-12 LAB — VITAMIN D 25 HYDROXY (VIT D DEFICIENCY, FRACTURES): VITD: 9.27 ng/mL — ABNORMAL LOW (ref 30.00–100.00)

## 2017-06-12 LAB — VITAMIN B12: Vitamin B-12: 368 pg/mL (ref 211–911)

## 2017-06-12 MED ORDER — VITAMIN D (CHOLECALCIFEROL) 25 MCG (1000 UT) PO CAPS: 1000.0000 [IU] | ORAL_CAPSULE | Freq: Every day | ORAL | 1 refills | Status: DC

## 2017-06-12 MED ORDER — FISH OIL 1000 MG PO CAPS: 1000.0000 mg | ORAL_CAPSULE | Freq: Every day | ORAL | 1 refills | Status: DC

## 2017-06-13 ENCOUNTER — Encounter: Payer: Self-pay | Admitting: Family Medicine

## 2017-06-13 NOTE — Progress Notes (Signed)
Brigette Hopfer is a 51 y.o. female is here for follow up.  History of Present Illness:   HPI: ER VISIT ON 05/28/17:  The patient states that on 05/21/17, she was leaving for work and fainted. She believes that she awoke 25 minutes later. She drove to work. On 05/28/17, she went to the ER due to continued right shoulder, neck, and head pain as well as short-term memory loss and feeling "foggy headed." CT head and neck were negative for acute findings. Xray of shoulder was negative. She was diagnosed with post concussion syndrome, contusions, hx of syncope, and renal insuffiency (creatinine was 1.28). She has been taking minimal Aleve prn. No new syncopal episodes. Concussion symptoms not improving.   There are no preventive care reminders to display for this patient. No flowsheet data found.   PMHx, SurgHx, SocialHx, FamHx, Medications, and Allergies were reviewed in the Visit Navigator and updated as appropriate.   Patient Active Problem List   Diagnosis Date Noted  . Diverticulosis 05/01/2017  . Low back pain 04/01/2017  . Stomach ulcer 04/01/2017  . Narcolepsy due to underlying condition without cataplexy 07/18/2016  . Chronic pansinusitis 01/27/2016  . History of colonoscopy with polypectomy 04/14/2015  . Bipolar I disorder, most recent episode (or current) unspecified 11/23/2014  . Obesity 12/23/2013  . Sleep apnea 11/25/2013  . Hypersomnia, persistent 04/29/2013  . Memory loss, short term   . Cognitive decline 04/03/2013  . Depression 04/03/2013   Social History  Substance Use Topics  . Smoking status: Never Smoker  . Smokeless tobacco: Never Used  . Alcohol use No   Current Medications and Allergies:   Current Outpatient Prescriptions:  .  Armodafinil (NUVIGIL) 250 MG tablet, Take 250 mg by mouth daily., Disp: , Rfl:  .  lamoTRIgine (LAMICTAL) 100 MG tablet, TAKE 1 TABLET (100 MG TOTAL) BY MOUTH DAILY., Disp: 30 tablet, Rfl: 3 .  LATUDA 40 MG TABS tablet, Take 1 tablet  (40 mg total) by mouth daily with breakfast., Disp: 30 tablet, Rfl: 3 .  Omega-3 Fatty Acids (FISH OIL) 1000 MG CAPS, Take 1 capsule (1,000 mg total) by mouth daily., Disp: 90 capsule, Rfl: 1 .  ondansetron (ZOFRAN ODT) 8 MG disintegrating tablet, Take 1 tablet (8 mg total) by mouth every 8 (eight) hours as needed for nausea or vomiting., Disp: 20 tablet, Rfl: 0 .  QUEtiapine (SEROQUEL XR) 200 MG 24 hr tablet, Take 1 tablet (200 mg total) by mouth at bedtime., Disp: 30 tablet, Rfl: 3 .  SUMAtriptan (IMITREX) 100 MG tablet, Take 50-100 mg by mouth as directed. at onset of headache and may repeat once in 2 hours if needed, Disp: , Rfl: 11 .  Vitamin D, Cholecalciferol, 1000 units CAPS, Take 1,000 Units by mouth daily., Disp: 60 capsule, Rfl: 1   Allergies  Allergen Reactions  . Aspirin Nausea And Vomiting and Rash  . Bee Venom Anaphylaxis  . Penicillins Anaphylaxis  . Bactrim [Sulfamethoxazole-Trimethoprim] Nausea And Vomiting  . Sulfa Antibiotics Nausea And Vomiting   Review of Systems   Pertinent items are noted in the HPI. Otherwise, ROS is negative.  Vitals:   Vitals:   06/12/17 0902  Weight: 224 lb (101.6 kg)     Body mass index is 32.14 kg/m.   Physical Exam:   Physical Exam  Constitutional: She is oriented to person, place, and time. She appears well-developed and well-nourished. No distress.  HENT:  Head: Normocephalic and atraumatic.  Right Ear: External ear normal.  Left  Ear: External ear normal.  Nose: Nose normal.  Mouth/Throat: Oropharynx is clear and moist.  Eyes: Pupils are equal, round, and reactive to light. EOM are normal.  Neck: Normal range of motion. Neck supple.  Cardiovascular: Normal rate, regular rhythm, normal heart sounds and intact distal pulses.   Pulmonary/Chest: Effort normal.  Abdominal: Soft.  Musculoskeletal: Normal range of motion.  Neurological: She is alert and oriented to person, place, and time.  Skin: Skin is warm. Capillary refill  takes less than 2 seconds.  Psychiatric: She has a normal mood and affect. Her behavior is normal.  Nursing note and vitals reviewed.   Results for orders placed or performed in visit on 06/12/17  Comprehensive metabolic panel  Result Value Ref Range   Sodium 142 135 - 145 mEq/L   Potassium 4.3 3.5 - 5.1 mEq/L   Chloride 106 96 - 112 mEq/L   CO2 25 19 - 32 mEq/L   Glucose, Bld 107 (H) 70 - 99 mg/dL   BUN 10 6 - 23 mg/dL   Creatinine, Ser 1.03 0.40 - 1.20 mg/dL   Total Bilirubin 0.6 0.2 - 1.2 mg/dL   Alkaline Phosphatase 51 39 - 117 U/L   AST 16 0 - 37 U/L   ALT 15 0 - 35 U/L   Total Protein 6.7 6.0 - 8.3 g/dL   Albumin 4.0 3.5 - 5.2 g/dL   Calcium 9.7 8.4 - 10.5 mg/dL   GFR 72.36 >60.00 mL/min  VITAMIN D 25 Hydroxy (Vit-D Deficiency, Fractures)  Result Value Ref Range   VITD 9.27 (L) 30.00 - 100.00 ng/mL  Vitamin B12  Result Value Ref Range   Vitamin B-12 368 211 - 911 pg/mL   Assessment and Plan:   Diagnoses and all orders for this visit:  Post concussion syndrome Comments: Complicated in this patient with Hx of concussion and on multiple psychiatric medications. Recommend brain rest, light exercise.  Orders: -     Ambulatory referral to Neurology  Fatigue, unspecified type -     Comprehensive metabolic panel -     VITAMIN D 25 Hydroxy (Vit-D Deficiency, Fractures) -     Vitamin B12 -     Omega-3 Fatty Acids (FISH OIL) 1000 MG CAPS; Take 1 capsule (1,000 mg total) by mouth daily. -     Vitamin D, Cholecalciferol, 1000 units CAPS; Take 1,000 Units by mouth daily.  Syncope, unspecified syncope type Comments: ER visit reviewed. To Cardiology for Syncope. Orders: -     Ambulatory referral to Cardiology   . Reviewed expectations re: course of current medical issues. . Discussed self-management of symptoms. . Outlined signs and symptoms indicating need for more acute intervention. . Patient verbalized understanding and all questions were answered. [facility]  Maintenance issues including appropriate healthy diet, exercise, and smoking avoidance were discussed with patient. . See orders for this visit as documented in the electronic medical record. . Patient received an After Visit Summary.   Briscoe Deutscher, DO [place] 06/13/2017  Future Appointments Date Time Provider [place]  07/08/2017 9:40 AM End, Harrell Gave, MD [facility]  07/11/2017 8:30 AM Charlcie Cradle, MD [facility] None  07/15/2017 1:00 PM Dohmeier, Asencion Partridge, MD [facility] None

## 2017-06-17 MED ORDER — CHOLECALCIFEROL 1.25 MG (50000 UT) PO TABS: ORAL_TABLET | ORAL | 0 refills | Status: DC

## 2017-06-17 NOTE — Addendum Note (Signed)
Addended by: Briscoe Deutscher R on: 06/17/2017 07:44 AM   Modules accepted: Orders

## 2017-06-18 ENCOUNTER — Telehealth: Payer: Self-pay | Admitting: Family Medicine

## 2017-06-18 NOTE — Telephone Encounter (Signed)
Please advise on refill. I do not see where patient has been this before.

## 2017-06-18 NOTE — Telephone Encounter (Signed)
Okay refill. 

## 2017-06-18 NOTE — Telephone Encounter (Signed)
MEDICATION: epi pen  PHARMACY:  [facility]  IS THIS A 90 DAY SUPPLY : no  IS PATIENT OUT OF MEDICATION: yes  IF NOT; HOW MUCH IS LEFT: none  LAST APPOINTMENT DATE: @10 /17/2018  NEXT APPOINTMENT DATE:  OTHER COMMENTS: Epi Pen expired   **Let patient know to contact pharmacy at the end of the day to make sure medication is ready. **  ** Please notify patient to allow 48-72 hours to process**  **Encourage patient to contact the pharmacy for refills or they can request refills through [facility]**

## 2017-06-19 MED ORDER — EPINEPHRINE 0.3 MG/0.3ML IJ SOAJ: 0.3000 mg | Freq: Once | INTRAMUSCULAR | 0 refills | Status: AC

## 2017-06-19 NOTE — Telephone Encounter (Signed)
RX sent to the pharmacy  

## 2017-07-08 ENCOUNTER — Encounter: Payer: Self-pay | Admitting: Internal Medicine

## 2017-07-08 ENCOUNTER — Ambulatory Visit (INDEPENDENT_AMBULATORY_CARE_PROVIDER_SITE_OTHER): Payer: 59 | Admitting: Internal Medicine

## 2017-07-08 VITALS — BP 124/80 | HR 87 | Ht 70.0 in | Wt 231.0 lb

## 2017-07-08 DIAGNOSIS — R55 Syncope and collapse: Secondary | ICD-10-CM | POA: Insufficient documentation

## 2017-07-08 NOTE — Patient Instructions (Addendum)
Medication Instructions:  Your physician recommends that you continue on your current medications as directed. Please refer to the Current Medication list given to you today.   Labwork: None   Testing/Procedures: Your physician has requested that you have an echocardiogram. Echocardiography is a painless test that uses sound waves to create images of your heart. It provides your doctor with information about the size and shape of your heart and how well your heart's chambers and valves are working. This procedure takes approximately one hour. There are no restrictions for this procedure.  Your physician has recommended that you wear an event monitor. Event monitors are medical devices that record the heart's electrical activity. Doctors most often [place] these monitors to diagnose arrhythmias. Arrhythmias are problems with the speed or rhythm of the heartbeat. The monitor is a small, portable device. You can wear one while you do your normal daily activities. This is usually used to diagnose what is causing palpitations/syncope (passing out).  66 DAY  Follow-Up: Your physician recommends that you schedule a follow-up appointment in: 3 months with Dr End.  DR END RECOMMENDS THAT YOU DO NOT DRIVE FOR AT LEAST 6 MONTHS      If you need a refill on your cardiac medications before your next appointment, please call your pharmacy.

## 2017-07-08 NOTE — Progress Notes (Signed)
New Outpatient Visit Date: 07/08/2017  Referring Provider: Briscoe Deutscher, [place], Robin [name] [id number]  Chief Complaint: Syncope  HPI:  Robin [name] is a 51 y.o. female who is being seen today for the evaluation of syncope at the request of Briscoe Deutscher, DO. She has a history of asthma, bipolar disorder, and sleep apnea. The patient reports that she was leaving for work on 05/21/17 and passed out. She believes that she was unconscious for approximately 20 minutes. She proceeded to work but ultimately presented to the emergency department on 05/28/17 for evaluation of persistent headache as well as right shoulder and neck pain. Evaluation in the ED was unremarkable with the exception of mildly elevated creatinine (1.28).  Today, [name] reports that she has been feeling well. Leading up to her syncopal episode in September, she had not experienced any symptoms including chest pain, shortness of breath, lightheadedness, and palpitations. She has chronic fatigue, but did not feel any different than usual on the day of her event. Mr. Cordell was bending forward to tie her shoe when she suddenly fell forward and then awoke about 20 minutes later. She has not had any episodes similar to this before or after this isolated event. She denies a history of prior cardiovascular disease and testing. There were no medication additions or changes around the time of her event.  At this time, [name] feels back to her baseline with the exception of intermittent "fogginess." She feels as though her concentration at times is not as good as it was before her event. She is scheduled for follow-up with neurology, given concern for concussion at the time of her syncopal episode. [name] does not have a history of any neurologic problems. She has never had a seizure to her knowledge.  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 She is typically quite  active at work but does not exercise regularly.  --------------------------------------------------------------------------------------------------  Cardiovascular History & Procedures: Cardiovascular Problems:  Syncope  Risk Factors:  Obesity  Cath/PCI:  None  CV Surgery:  None  EP Procedures and Devices:  None  Non-Invasive Evaluation(s):  None  Recent CV Pertinent Labs: Lab Results  Component Value Date   CHOL 169 03/18/2017   HDL 39 (L) 03/18/2017   LDLCALC 102 (H) 03/18/2017   TRIG 140 03/18/2017   CHOLHDL 4.3 03/18/2017   K 4.3 06/12/2017   BUN 10 06/12/2017   CREATININE 1.03 06/12/2017   CREATININE 1.14 (H) 03/18/2017    --------------------------------------------------------------------------------------------------  Past Medical History:  Diagnosis Date  . Asthma   . Bipolar disorder ([place])   . Gestational hypertension   . HA (headache)   . History of borderline personality disorder   . Hypersomnia, persistent 04/29/2013  . IBS (irritable bowel syndrome)   . Memory loss, short term    [name] 04-03-13   . Migraine   . Seasonal allergies     Past Surgical History:  Procedure Laterality Date  . ABDOMINAL HYSTERECTOMY  00  . KNEE SURGERY Right 96,98,00,02,05,2014  . NASAL SINUS SURGERY    . NOSE SURGERY     [phone]  . WRIST SURGERY Right [phone]    Current Meds  Medication Sig  . Armodafinil (NUVIGIL) 250 MG tablet Take 250 mg by mouth daily.  . Cholecalciferol 50000 units TABS 50,000 units PO qwk for 12 weeks.  Marland Kitchen EPINEPHrine 0.3 mg/0.3 mL IJ SOAJ injection as needed. INJECT 0.3 MLS (0.3  MG TOTAL) INTO THE MUSCLE ONCE  . lamoTRIgine (LAMICTAL) 100 MG tablet TAKE 1 TABLET (100 MG TOTAL) BY MOUTH DAILY.  Marland Kitchen LATUDA 40 MG TABS tablet Take 1 tablet (40 mg total) by mouth daily with breakfast.  . Omega-3 Fatty Acids (FISH OIL) 1000 MG CAPS Take 1 capsule (1,000 mg total) by mouth daily.  . ondansetron (ZOFRAN ODT) 8 MG disintegrating  tablet Take 1 tablet (8 mg total) by mouth every 8 (eight) hours as needed for nausea or vomiting.  Marland Kitchen QUEtiapine (SEROQUEL XR) 200 MG 24 hr tablet Take 1 tablet (200 mg total) by mouth at bedtime.  . SUMAtriptan (IMITREX) 100 MG tablet Take 50-100 mg by mouth as directed. at onset of headache and may repeat once in 2 hours if needed  . Vitamin D, Cholecalciferol, 1000 units CAPS Take 1,000 Units by mouth daily.    Allergies: Aspirin; Bee venom; Penicillins; Bactrim [sulfamethoxazole-trimethoprim]; and Sulfa antibiotics  Social History   Socioeconomic History  . Marital status: Married    Spouse name: [name]  . Number of children: 2  . Years of education: AA  . Highest education level: Not on file  Social Needs  . Financial resource strain: Not on file  . Food insecurity - worry: Not on file  . Food insecurity - inability: Not on file  . Transportation needs - medical: Not on file  . Transportation needs - non-medical: Not on file  Occupational History    Employer: [employer]: [employer]  Tobacco Use  . Smoking status: Never Smoker  . Smokeless tobacco: Never Used  Substance and Sexual Activity  . Alcohol use: No    Comment: 2-3 drinks per year  . Drug use: No  . Sexual activity: Not on file  Other Topics Concern  . Not on file  Social History Narrative   Pneumonia vaccine 2005   Patient lives at home with wife and 2 sons.    Patient works in [employer].    Patient has her AA             Family History  Problem Relation Age of Onset  . Depression Mother   . High Cholesterol Mother   . Hypertension Mother   . Heart failure Mother   . Hypertension Father   . High blood pressure Maternal Grandfather   . Heart attack Maternal Grandfather   . Breast cancer Maternal Grandmother   . Diabetes Maternal Grandmother   . High blood pressure Maternal Grandmother   . Heart disease Maternal Grandmother   . Breast cancer Sister   .  Schizophrenia Brother     Review of Systems: Review of Systems  Constitutional: Positive for malaise/fatigue.  HENT: Negative.   Eyes: Negative.   Respiratory: Negative.   Cardiovascular: Negative.   Gastrointestinal: Positive for abdominal pain, constipation and nausea.  Genitourinary: Negative.   Musculoskeletal: Positive for joint pain and myalgias.  Skin: Negative.   Neurological: Positive for loss of consciousness (see HPI) and headaches.  Endo/Heme/Allergies: Negative.   Psychiatric/Behavioral: Negative.    --------------------------------------------------------------------------------------------------  Physical Exam: BP 124/80   Pulse 87   Ht 5\' 10"  (1.778 m)   Wt 231 lb (104.8 kg)   SpO2 99%   BMI 33.15 kg/m   General:  Obese woman, seated comfortably in the exam room. HEENT: No conjunctival pallor or scleral icterus. Moist mucous membranes. OP clear. Neck: Supple without lymphadenopathy, thyromegaly, JVD, or HJR. No carotid bruit. Lungs: Normal work of  breathing. Clear to auscultation bilaterally without wheezes or crackles. Heart: Regular rate and rhythm without murmurs, rubs, or gallops. Non-displaced PMI. Abd: Bowel sounds present. Soft, NT/ND without hepatosplenomegaly Ext: No lower extremity edema. Radial, PT, and DP pulses are 2+ bilaterally Skin: Warm and dry without rash. Neuro: CNIII-XII intact. Strength and fine-touch sensation intact in upper and lower extremities bilaterally. Psych: Normal mood and affect.  EKG:  Normal sinus rhythm with nonspecific ST/T changes.  Lab Results  Component Value Date   WBC 9.9 05/28/2017   HGB 12.4 05/28/2017   HCT 38.5 05/28/2017   MCV 86.7 05/28/2017   PLT 255 05/28/2017    Lab Results  Component Value Date   NA 142 06/12/2017   K 4.3 06/12/2017   CL 106 06/12/2017   CO2 25 06/12/2017   BUN 10 06/12/2017   CREATININE 1.03 06/12/2017   GLUCOSE 107 (H) 06/12/2017   ALT 15 06/12/2017    Lab Results    Component Value Date   CHOL 169 03/18/2017   HDL 39 (L) 03/18/2017   LDLCALC 102 (H) 03/18/2017   TRIG 140 03/18/2017   CHOLHDL 4.3 03/18/2017    --------------------------------------------------------------------------------------------------  ASSESSMENT AND PLAN: Syncope Etiology remains unclear. There were no prodromal symptoms such as palpitations and lightheadedness. Certainly, an arrhythmogenic cause remains a concern, though given that this was an isolated event, it may be challenging to identify a definitive cause. Her family history is notable for her mother having heart failure. We have agreed to obtain a transthoracic echocardiogram and 30-day event monitor. If this workup is unrevealing, we will consider noninvasive ischemia testing given nonspecific EKG changes (though patient is otherwise asymptomatic). I have advised her to refrain from driving for at least 6 months from the time of her syncopal event. I also advised her to proceed with the neurology consultation ordered by her PCP.  Follow-up: Return to clinic in 3 months.  Nelva Bush, MD 07/08/2017 10:39 AM

## 2017-07-11 ENCOUNTER — Telehealth: Payer: Self-pay | Admitting: Family Medicine

## 2017-07-11 ENCOUNTER — Ambulatory Visit (HOSPITAL_COMMUNITY): Payer: 59 | Admitting: Psychiatry

## 2017-07-11 NOTE — Telephone Encounter (Signed)
Script was written for vitamin D3 but pharmacy filled D2 by accident. Patient already picked up script and when pharmacy tries to contact her they can't leave a vmail b/c it's full.

## 2017-07-15 ENCOUNTER — Encounter: Payer: Self-pay | Admitting: Neurology

## 2017-07-15 ENCOUNTER — Ambulatory Visit (INDEPENDENT_AMBULATORY_CARE_PROVIDER_SITE_OTHER): Payer: 59 | Admitting: Neurology

## 2017-07-15 ENCOUNTER — Encounter: Payer: Self-pay | Admitting: Psychology

## 2017-07-15 VITALS — BP 115/68 | HR 74 | Ht 70.0 in | Wt 232.0 lb

## 2017-07-15 DIAGNOSIS — R419 Unspecified symptoms and signs involving cognitive functions and awareness: Secondary | ICD-10-CM | POA: Diagnosis not present

## 2017-07-15 DIAGNOSIS — R55 Syncope and collapse: Secondary | ICD-10-CM

## 2017-07-15 DIAGNOSIS — G4719 Other hypersomnia: Secondary | ICD-10-CM | POA: Diagnosis not present

## 2017-07-15 DIAGNOSIS — F3176 Bipolar disorder, in full remission, most recent episode depressed: Secondary | ICD-10-CM

## 2017-07-15 MED ORDER — ARMODAFINIL 250 MG PO TABS: 250.0000 mg | ORAL_TABLET | Freq: Every day | ORAL | 3 refills | Status: DC

## 2017-07-15 NOTE — Progress Notes (Signed)
Provider:  Larey Seat, M D  Referring Provider: Briscoe Deutscher, DO Primary Care Physician:  Briscoe Deutscher, DO     sleep medicine clinic evaluation.  Chief Complaint  Patient presents with  . New Patient (Initial Visit)    pt alone, rm 10. pt states on avg gets about 5 hours of sleep a night. pt states that she snores in sleep.     HPI:  Robin [name], a 51 y.o. female of afro-american heritage , right handed, is seen here as a referral  from Dr. Juleen [name] for sleep and not for postconcussion evaluation.  Robin [name] states that she suffered a fainting spell within her home on 21 May 2017, as she was leaving for work.  But he witnessed a spell and she cannot be quite sure how long she was out he estimates 20-25 minutes.  In addition she is not quite sure if she fainted or if there could have been another precipitating event. She drove to work after recovering but then about a week later went to the emergency room due to head pain, neck pain and right shoulder pain she also felt foggy headed.  A CT of the head was obtained as well as of the neck, both were interpreted as negative.  Same for the x-ray of her right shoulder.  The emergency room diagnosed her was postconcussion syndrome, syncope and renal insufficiency as her creatinine was 1.28.  She had been taking Aleve since the spell had no new syncopal episodes, but her symptoms have not improved.  She continues to describe shoulder pain neck pain and headache.  Patient has pre-existing diagnoses of hypersomnia, memory loss, cognitive decline and depression.  I reviewed the patient's previous medical history as well as her medications. Her cardiac monitoring study was negative. She has no history of strokes or seizures or previous spells, neither since.   Patient carries a diagnosis of bipolar disorder but has been stable on Lamictal and Latuda for the last for 5 years.  In addition the patient was diagnosed 4 years ago by Dr.  Marilu [name] with narcolepsy.  A polysomnography was then obtained on 20 September 2013, AHI was 0.5, Epworth Sleepiness Scale was endorsed at 22 out of 24 possible points.  The body mass index at the time was elevated at 32, and REM onset was noted in the past last study was followed by an M SLT.  I would like to add that the M SLT followed on 11 November 2013 almost 2 months after the PSG was obtained.  The daytime study was not following a PSG the very night before the test.  Dr. min and reports that this study is positive for narcolepsy with a mean sleep latency of 3.9 minutes and one sleep onset REM in nap 4.  Study was performed while the patient was on Latuda and Lamictal.  Also on topiramate, Seroquel, and baclofen in the daytime but Nuvigil has been given to allow better alertness and control of excessive daytime sleepiness. This study would be invalid by AASM criteria.  !    The patient's usual bedtime is between 10 and 10:30 PM, she is usually asleep promptly.  The patient prefers to sleep on her side, her bedroom is cool, quiet and dark.  Conducive to sleep.  She uses 1 or 2 pillows for head support. The patient usually goes to the bathroom around midnight once at night otherwise she can sleep through until 6 AM. The patient does  not report vivid dreams on a regular basis, usually does not act out her dreams, is not prone to parasomnia, sleepwalking, and enuresis. The patient rises at 6, wakes up spontaneously, feels usually still tired and not refreshed or restored. She does not take daytime naps, but on weekends may nap for 1 hour or so while watching TV. Her wife is sleeping in the same room and reports her to snore loudly.  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  Non smoker, ETOH ; twice a year, caffeine use - 2 times a month a coke.     Review of Systems: Out of a complete 14 system review, the patient  complains of only the following symptoms, and all other reviewed systems are negative.  loud snoring , but no witnessed  Acc. to spouse. Questionable length of loss of consciousness.   Memory decline reported since 2014 in [place].  MSLT was not performed by AASM criteria.   Social History   Socioeconomic History  . Marital status: Married    Spouse name: Robin [name]  . Number of children: 2  . Years of education: AA  . Highest education level: Not on file  Social Needs  . Financial resource strain: Not on file  . Food insecurity - worry: Not on file  . Food insecurity - inability: Not on file  . Transportation needs - medical: Not on file  . Transportation needs - non-medical: Not on file  Occupational History    Employer: [employer]: [employer]  Tobacco Use  . Smoking status: Never Smoker  . Smokeless tobacco: Never Used  Substance and Sexual Activity  . Alcohol use: No    Comment: 2-3 drinks per year  . Drug use: No  . Sexual activity: Not on file  Other Topics Concern  . Not on file  Social History Narrative   Pneumonia vaccine 2005   Patient lives at home with wife and 2 sons.    Patient works in [place].    Patient has her AA             Family History  Problem Relation Age of Onset  . Depression Mother   . High Cholesterol Mother   . Hypertension Mother   . Heart failure Mother   . Hypertension Father   . High blood pressure Maternal Grandfather   . Heart attack Maternal Grandfather   . Breast cancer Maternal Grandmother   . Diabetes Maternal Grandmother   . High blood pressure Maternal Grandmother   . Heart disease Maternal Grandmother   . Breast cancer Sister   . Schizophrenia Brother     Past Medical History:  Diagnosis Date  . Asthma   . Bipolar disorder ([place])   . Gestational hypertension   . HA (headache)   . History of borderline personality disorder   . Hypersomnia, persistent 04/29/2013  . IBS  (irritable bowel syndrome)   . Memory loss, short term    [name] 04-03-13   . Migraine   . Seasonal allergies     Past Surgical History:  Procedure Laterality Date  . ABDOMINAL HYSTERECTOMY  00  . KNEE SURGERY Right 96,98,00,02,05,2014  . NASAL SINUS SURGERY    . NOSE SURGERY     [id number]  . WRIST SURGERY Right [phone]    Current Outpatient Medications  Medication Sig Dispense Refill  . Armodafinil (NUVIGIL) 250 MG tablet  Take 250 mg by mouth daily.    . Cholecalciferol 50000 units TABS 50,000 units PO qwk for 12 weeks. 12 tablet 0  . EPINEPHrine 0.3 mg/0.3 mL IJ SOAJ injection as needed. INJECT 0.3 MLS (0.3 MG TOTAL) INTO THE MUSCLE ONCE  0  . lamoTRIgine (LAMICTAL) 100 MG tablet TAKE 1 TABLET (100 MG TOTAL) BY MOUTH DAILY. 30 tablet 3  . LATUDA 40 MG TABS tablet Take 1 tablet (40 mg total) by mouth daily with breakfast. 30 tablet 3  . Omega-3 Fatty Acids (FISH OIL) 1000 MG CAPS Take 1 capsule (1,000 mg total) by mouth daily. 90 capsule 1  . ondansetron (ZOFRAN ODT) 8 MG disintegrating tablet Take 1 tablet (8 mg total) by mouth every 8 (eight) hours as needed for nausea or vomiting. 20 tablet 0  . QUEtiapine (SEROQUEL XR) 200 MG 24 hr tablet Take 1 tablet (200 mg total) by mouth at bedtime. 30 tablet 3  . SUMAtriptan (IMITREX) 100 MG tablet Take 50-100 mg by mouth as directed. at onset of headache and may repeat once in 2 hours if needed  11  . Vitamin D, Cholecalciferol, 1000 units CAPS Take 1,000 Units by mouth daily. 60 capsule 1   No current facility-administered medications for this visit.     Allergies as of 07/15/2017 - Review Complete 07/15/2017  Allergen Reaction Noted  . Aspirin Nausea And Vomiting and Rash 04/02/2013  . Bee venom Anaphylaxis 11/23/2014  . Penicillins Anaphylaxis 04/02/2013  . Bactrim [sulfamethoxazole-trimethoprim] Nausea And Vomiting 04/02/2013  . Sulfa antibiotics Nausea And Vomiting 11/23/2014    Vitals: BP 115/68   Pulse 74   Ht 5'  10" (1.778 m)   Wt 232 lb (105.2 kg)   BMI 33.29 kg/m  Last Weight:  Wt Readings from Last 1 Encounters:  07/15/17 232 lb (105.2 kg)   Last Height:   Ht Readings from Last 1 Encounters:  07/15/17 5' 10"  (1.778 m)    Physical exam:  General: The patient is awake, alert and appears not in acute distress. The patient is well groomed. Head: Normocephalic, atraumatic. Neck is supple. Mallampati 4, neck circumference 16, retrognathia . Cardiovascular:  Regular rate and rhythm , without  murmurs or carotid bruit, and without distended neck veins. Respiratory: Lungs are clear to auscultation. Skin:  Without evidence of edema, or rash Trunk: BMI is 33.29 , is elevated- Has a normal posture.  Neurologic exam : The patient is awake and alert, oriented to place and time.  Memory subjective  described as impaired - this is not part of today's visit. . There is a normal attention span & concentration ability. Speech is fluent without dysarthria, dysphonia or aphasia. Mood and affect are appropriate.  Cranial nerves:Pupils are equal and briskly reactive to light. Funduscopic exam without evidence of pallor or edema.  Extraocular movements  in vertical and horizontal planes intact and without nystagmus.  Visual fields by finger perimetry are intact. Hearing to finger rub intact.  Facial sensation intact to fine touch.  Facial motor strength is symmetric and tongue and uvula move midline. Tongue protrusion into either cheek is normal. Shoulder shrug is normal.   Motor exam: Normal tone ,muscle bulk and symmetric  strength in all extremities.Sensory:  Fine touch, pinprick and vibration were tested in all extremities. Proprioception was normal.Coordination: Finger maneuver without evidence of ataxia, dysmetria or tremor. Gait and station: Patient walks without assistive device and is able unassisted to climb up to the exam table. Strength within normal limits.  Stance is stable and normal. Tandem gait is  unfragmented. Romberg testing is negative   Deep tendon reflexes: in the  upper and lower extremities are symmetric and intact.   Assessment:  After physical and neurologic examination, review of laboratory studies, imaging, neurophysiology testing and pre-existing records, assessment is that of :  1)Patient had presented in September to [facility] emergency room about a week after a spell that is best described as a prolonged loss of awareness or prolonged loss of consciousness.  There was no warning symptoms the patient simply seems to have passed out for a duration that is unusual.  She woke up close to the door on the floor with pain in her right head and right shoulder, presumably injured in the fall. A CT declared but no bony injury or bleed had occurred.  The patient feels that her cognitive abilities however have been impaired greater than prior to the fall I would refer neuro psychological testing battery to see if this is the case or if there could be another comorbidity influencing her memory. Based on a nonfocal neurologic exam could not declare this to be a postconcussion syndrome.  2) the patient carries a diagnosis of excessive daytime sleepiness, and had been evaluated at [facility] by Dr. Lauraine Rinne. While there was no indication for apnea in January 2015, polysomnography was not repeated from the patient underwent an M SLT and she was not asked to wean off REM suppressant medication.  This in context with the review of systems which includes excessive daytime sleepiness, snoring, depression and anxiety I am not sure how to work the patient up further.  I would be willing to send HLA narcolepsy test. Patient has felt that there has been an improvement in daytime alertness under the use of modafinil, she would like to continue taking it. She is not at a place now where we could wean off antidepressant medication. MSLT would be invalid.      Plan:   Treatment plan and additional workup : HLA narcolepsy test, modafinil refill.  Neuropsychological battery at [facility].  Dizziness- unrelated to fall- encourage hydration.  Rv after N-Psych with Np.    Asencion Partridge Nolan Tuazon MD 07/15/2017

## 2017-07-19 LAB — NARCOLEPSY EVALUATION
DQA1*01:02: POSITIVE
DQB1*06:02: NEGATIVE

## 2017-07-22 ENCOUNTER — Telehealth: Payer: Self-pay | Admitting: Neurology

## 2017-07-22 ENCOUNTER — Ambulatory Visit (INDEPENDENT_AMBULATORY_CARE_PROVIDER_SITE_OTHER): Payer: 59

## 2017-07-22 ENCOUNTER — Ambulatory Visit (HOSPITAL_COMMUNITY): Payer: 59 | Attending: Cardiovascular Disease

## 2017-07-22 ENCOUNTER — Other Ambulatory Visit: Payer: Self-pay

## 2017-07-22 DIAGNOSIS — R55 Syncope and collapse: Secondary | ICD-10-CM | POA: Diagnosis not present

## 2017-07-22 LAB — ECHOCARDIOGRAM COMPLETE
AOASC: 28 cm
E decel time: 232 msec
EERAT: 7.37
FS: 39 % (ref 28–44)
IVS/LV PW RATIO, ED: 1.19
LA ID, A-P, ES: 33 mm
LA diam index: 1.49 cm/m2
LA vol index: 25.7 mL/m2
LA vol: 57 mL
LAVOLA4C: 57 mL
LDCA: 2.54 cm2
LEFT ATRIUM END SYS DIAM: 33 mm
LV SIMPSON'S DISK: 57
LV TDI E'LATERAL: 12
LV TDI E'MEDIAL: 9.26
LV dias vol index: 40 mL/m2
LV dias vol: 89 mL (ref 46–106)
LVEEAVG: 7.37
LVEEMED: 7.37
LVELAT: 12 cm/s
LVOT VTI: 20.5 cm
LVOT diameter: 18 mm
LVOT peak grad rest: 4 mmHg
LVOT peak vel: 101 cm/s
LVOTSV: 52 mL
LVSYSVOL: 38 mL (ref 14–42)
LVSYSVOLIN: 17 mL/m2
MV Dec: 232
MVPG: 3 mmHg
MVPKAVEL: 61.2 m/s
MVPKEVEL: 88.4 m/s
PW: 9.16 mm — AB (ref 0.6–1.1)
RV LATERAL S' VELOCITY: 14.3 cm/s
RV sys press: 21 mmHg
Reg peak vel: 210 cm/s
Stroke v: 51 ml
TRMAXVEL: 210 cm/s

## 2017-07-22 NOTE — Telephone Encounter (Signed)
Called the patient back to make her aware that the sleep studies would not be able to be completed for her in order for it to be a valid test. I have called the patient to offer a apt to bring her in so that we can talk about treatment moving forward. LVM with this information.  If patient calls back please schedule her apt. We can make it a 8 am apt to work her in.

## 2017-07-22 NOTE — Telephone Encounter (Signed)
-----  Message from Larey Seat, MD sent at 07/22/2017  8:11 AM EST ----- HLA DQ alpha is positive and in context with the clinical symptoms supports a diagnosis of narcolepsy.  Most Insurances will not accept this HLA test as diagnostic and we will need a PSG with MSLT for confirmation.

## 2017-07-22 NOTE — Telephone Encounter (Signed)
Called the patient to discuss the lab results with her. On answer. LVM for the patient to call back.

## 2017-07-22 NOTE — Telephone Encounter (Signed)
We couldn't do an MSLT on her medications- Latuda,  Lamictal, Seroquel. I don't consider her safe to wean off without her prescribing psychiatrist's input.

## 2017-07-22 NOTE — Telephone Encounter (Signed)
Pt returned call and I was able to go over the lab work with her. I explained in order to confirm diagnosis of narcolepsy we will need to order sleep studies. I explained to her what all that would entail. Pt verbalized understanding. Pt had no questions at this time but was encouraged to call back if questions arise. Explained to her that our sleep lab will contact her to get her set up for these sleep studies.

## 2017-07-23 ENCOUNTER — Telehealth: Payer: Self-pay | Admitting: Internal Medicine

## 2017-07-23 NOTE — Telephone Encounter (Signed)
Follow Up: ° ° ° °Returning your call from this morning. °

## 2017-07-23 NOTE — Telephone Encounter (Signed)
Spoke with patient about echocardiogram results from 07/22/17

## 2017-08-09 ENCOUNTER — Other Ambulatory Visit (HOSPITAL_COMMUNITY): Payer: Self-pay | Admitting: Psychiatry

## 2017-08-09 DIAGNOSIS — F319 Bipolar disorder, unspecified: Secondary | ICD-10-CM

## 2017-08-11 ENCOUNTER — Other Ambulatory Visit: Payer: Self-pay | Admitting: Family Medicine

## 2017-09-03 ENCOUNTER — Encounter: Payer: Self-pay | Admitting: Neurology

## 2017-09-03 ENCOUNTER — Ambulatory Visit (INDEPENDENT_AMBULATORY_CARE_PROVIDER_SITE_OTHER): Payer: 59 | Admitting: Neurology

## 2017-09-03 VITALS — BP 130/71 | HR 62 | Ht 69.0 in | Wt 234.0 lb

## 2017-09-03 DIAGNOSIS — G47419 Narcolepsy without cataplexy: Secondary | ICD-10-CM

## 2017-09-03 MED ORDER — ARMODAFINIL 250 MG PO TABS: 250.0000 mg | ORAL_TABLET | Freq: Every day | ORAL | 5 refills | Status: DC

## 2017-09-03 NOTE — Progress Notes (Signed)
Provider:  Larey Seat, M D  Referring Provider: Briscoe Deutscher, DO Primary Care Physician:  Briscoe Deutscher, DO   sleep medicine clinic evaluation.  Chief Complaint  Patient presents with  . Follow-up    pt alone, rm . pt here to talk about treatment options post sleep study. pt states still struggling with daytime sleepiness     Revisit note from 03 September 2017, I have the pleasure of meeting with Mrs. Bonneville today whose HLA test returned positive for narcolepsy.  The difficulties lie in a follow-up test of PSG was M SLT as she is on multiple REM sleep suppressant medications. The patient has undergone MSLT testing under Dr. Tonia Ghent , who diagnosed narcolepsy.    It is the onset of REM sleep during nap times of 20 minutes or less that makes a diagnosis of narcolepsy.  I have therefore started her only on Nuvigil generic armodafinil, as she could not qualify for Xyrem with the current testing status.  The patient had used Nuvigil several years ago but learned recently that the prescription was no longer honored at [facility] as a stop carrying the medication. Ms. Fike assured me that she is at this time not clinically depressed, that her medications seem to work well for her.  She had no further fainting spells or syncope spells since the event described on 21 May 2017.   HPI:    Bettie Capistran, a 52 y.o. female of afro-american heritage , right handed, is seen here as a referral  from Dr. Juleen China for sleep and not for postconcussion evaluation.  Mrs. Yiu states that she suffered a fainting spell within her home on 21 May 2017, as she was leaving for work.  But he witnessed a spell and she cannot be quite sure how long she was out he estimates 20-25 minutes.  In addition she is not quite sure if she fainted or if there could have been another precipitating event. She drove to work after recovering but then about a week later went to the emergency room due to head pain, neck  pain and right shoulder pain she also felt foggy headed.  A CT of the head was obtained as well as of the neck, both were interpreted as negative.  Same for the x-ray of her right shoulder.  The emergency room diagnosed her was postconcussion syndrome, syncope and renal insufficiency as her creatinine was 1.28.  She had been taking Aleve since the spell had no new syncopal episodes, but her symptoms have not improved.  She continues to describe shoulder pain neck pain and headache.  Patient has pre-existing diagnoses of hypersomnia, memory loss, cognitive decline and depression.  I reviewed the patient's previous medical history as well as her medications. Her cardiac monitoring study was negative. She has no history of strokes or seizures or previous spells, neither since.   Patient carries a diagnosis of bipolar disorder but has been stable on Lamictal and Latuda for the last for 5 years.  In addition the patient was diagnosed 4 years ago by Dr. [name] with narcolepsy.  A polysomnography was then obtained on 20 September 2013, AHI was 0.5, Epworth Sleepiness Scale was endorsed at 22 out of 24 possible points.  The body mass index at the time was elevated at 32, and REM onset was noted in the past last study was followed by an M SLT.  I would like to add that the M SLT followed on 11 November 2013  almost 2 months after the PSG was obtained.  The daytime study was not following a PSG the very night before the test.    Dr. Tonia Ghent reports that this study is positive for narcolepsy with a mean sleep latency of 3.9 minutes and one sleep onset REM in nap 4.  Study was performed while the patient was on Latuda and Lamictal.  Also on topiramate, Seroquel, and baclofen in the daytime but Nuvigil has been given to allow better alertness and control of excessive daytime sleepiness. This study would be invalid by AASM criteria.  !    The patient's usual bedtime is between 10 and 10:30 PM, she is usually asleep  promptly.  The patient prefers to sleep on her side, her bedroom is cool, quiet and dark.  Conducive to sleep.  She uses 1 or 2 pillows for head support. The patient usually goes to the bathroom around midnight once at night otherwise she can sleep through until 6 AM. The patient does not report vivid dreams on a regular basis, usually does not act out her dreams, is not prone to parasomnia, sleepwalking, and enuresis. The patient rises at 6, wakes up spontaneously, feels usually still tired and not refreshed or restored. She does not take daytime naps, but on weekends may nap for 1 hour or so while watching TV. Her wife is sleeping in the same room and reports her to snore loudly.  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  Non smoker, ETOH ; twice a year, caffeine use - 2 times a month a coke.     Review of Systems: Out of a complete 14 system review, the patient complains of only the following symptoms, and all other reviewed systems are negative.  loud snoring , but no witnessed  Acc. to spouse. Questionable length of loss of consciousness.   Memory decline reported since 2014 in [place].  MSLT was not performed by AASM criteria.   Social History   Socioeconomic History  . Marital status: Married    Spouse name: [name]  . Number of children: 2  . Years of education: AA  . Highest education level: Not on file  Social Needs  . Financial resource strain: Not on file  . Food insecurity - worry: Not on file  . Food insecurity - inability: Not on file  . Transportation needs - medical: Not on file  . Transportation needs - non-medical: Not on file  Occupational History    Employer: [employer]: [employer] [place]  Tobacco Use  . Smoking status: Never Smoker  . Smokeless tobacco: Never Used  Substance and Sexual Activity  . Alcohol use: No    Comment: 2-3 drinks per year    . Drug use: No  . Sexual activity: Not on file  Other Topics Concern  . Not on file  Social History Narrative   Pneumonia vaccine 2005   Patient lives at home with wife and 2 sons.    Patient works in [place].    Patient has her AA             Family History  Problem Relation Age of Onset  . Depression Mother   . High Cholesterol Mother   . Hypertension Mother   . Heart failure Mother   . Hypertension Father   . High blood pressure Maternal Grandfather   . Heart attack Maternal Grandfather   .  Breast cancer Maternal Grandmother   . Diabetes Maternal Grandmother   . High blood pressure Maternal Grandmother   . Heart disease Maternal Grandmother   . Breast cancer Sister   . Schizophrenia Brother     Past Medical History:  Diagnosis Date  . Asthma   . Bipolar disorder ([place])   . Gestational hypertension   . HA (headache)   . History of borderline personality disorder   . Hypersomnia, persistent 04/29/2013  . IBS (irritable bowel syndrome)   . Memory loss, short term    [name] 04-03-13   . Migraine   . Seasonal allergies     Past Surgical History:  Procedure Laterality Date  . ABDOMINAL HYSTERECTOMY  00  . KNEE SURGERY Right 96,98,00,02,05,2014  . NASAL SINUS SURGERY    . NOSE SURGERY     [id number]  . WRIST SURGERY Right [phone]    Current Outpatient Medications  Medication Sig Dispense Refill  . Armodafinil (NUVIGIL) 250 MG tablet Take 1 tablet (250 mg total) daily by mouth. 90 tablet 3  . Cholecalciferol (VITAMIN D3) 50000 units CAPS TAKE ONE CAPSULE BY MOUTH EVERY WEEK 8 capsule 0  . Cholecalciferol 50000 units TABS 50,000 units PO qwk for 12 weeks. 12 tablet 0  . EPINEPHrine 0.3 mg/0.3 mL IJ SOAJ injection as needed. INJECT 0.3 MLS (0.3 MG TOTAL) INTO THE MUSCLE ONCE  0  . lamoTRIgine (LAMICTAL) 100 MG tablet TAKE 1 TABLET (100 MG TOTAL) BY MOUTH DAILY. 30 tablet 3  . LATUDA 40 MG TABS tablet TAKE 1 TABLET (40 MG TOTAL) BY MOUTH DAILY WITH  BREAKFAST. 30 tablet 0  . Omega-3 Fatty Acids (FISH OIL) 1000 MG CAPS Take 1 capsule (1,000 mg total) by mouth daily. 90 capsule 1  . ondansetron (ZOFRAN ODT) 8 MG disintegrating tablet Take 1 tablet (8 mg total) by mouth every 8 (eight) hours as needed for nausea or vomiting. 20 tablet 0  . QUEtiapine (SEROQUEL XR) 200 MG 24 hr tablet TAKE 1 TABLET (200 MG TOTAL) BY MOUTH AT BEDTIME. 30 tablet 0  . SUMAtriptan (IMITREX) 100 MG tablet Take 50-100 mg by mouth as directed. at onset of headache and may repeat once in 2 hours if needed  11  . Vitamin D, Cholecalciferol, 1000 units CAPS Take 1,000 Units by mouth daily. 60 capsule 1   No current facility-administered medications for this visit.     Allergies as of 09/03/2017 - Review Complete 09/03/2017  Allergen Reaction Noted  . Aspirin Nausea And Vomiting and Rash 04/02/2013  . Bee venom Anaphylaxis 11/23/2014  . Penicillins Anaphylaxis 04/02/2013  . Bactrim [sulfamethoxazole-trimethoprim] Nausea And Vomiting 04/02/2013  . Sulfa antibiotics Nausea And Vomiting 11/23/2014    Vitals: BP 130/71   Pulse 62   Ht 5' 9"  (1.753 m)   Wt 234 lb (106.1 kg)   BMI 34.56 kg/m  Last Weight:  Wt Readings from Last 1 Encounters:  09/03/17 234 lb (106.1 kg)   Last Height:   Ht Readings from Last 1 Encounters:  09/03/17 5' 9"  (1.753 m)    Physical exam:  General: The patient is awake, alert and appears not in acute distress. The patient is well groomed. Head: Normocephalic, atraumatic. Neck is supple. Mallampati 4, neck circumference 16, retrognathia . Cardiovascular:  Regular rate and rhythm , without  murmurs or carotid bruit, and without distended neck veins. Respiratory: Lungs are clear to auscultation. Skin:  Without evidence of edema, or rash Trunk: BMI is elevated-  normal posture.  Neurologic exam : The patient is awake and alert, oriented to place and time.  Memory subjective  described as impaired - this is not part of today's visit.  . There is a normal attention span & concentration ability. Speech is fluent without dysarthria, dysphonia or aphasia. Mood and affect are appropriate.  Cranial nerves: no changes in taste or smell. Pupils are equal and briskly reactive to light. Funduscopic exam without evidence of pallor or edema.  Extraocular movements  in vertical and horizontal planes intact and without nystagmus.  Visual fields by finger perimetry are intact. Hearing to finger rub intact.  Facial sensation intact to fine touch.  Facial motor strength is symmetric and tongue and uvula move midline. Tongue protrusion into either cheek is normal. Shoulder shrug is symmetric . Motor exam: Normal tone ,muscle bulk and symmetric  strength in all extremities.Sensory:  Fine touch, pinprick and vibration were tested in all extremities. Proprioception was normal.Coordination: Finger maneuver without evidence of ataxia, dysmetria or tremor. Gait and station:  Strength within normal limits. Stance is stable and normal. No falls.  Deep tendon reflexes: in the  upper and lower extremities are symmetric and intact.   Assessment:  After physical and neurologic examination, review of laboratory studies, imaging, neurophysiology testing and pre-existing records, assessment is that of :  1)Patient had presented in September to [facility] emergency room about a week after a spell that is best described as a prolonged loss of awareness or prolonged loss of consciousness.  There was no warning symptoms the patient simply seems to have passed out for a duration that is unusual.  She woke up close to the door on the floor with pain in her right head and right shoulder, presumably injured in the fall.  A CT declared  no bony injury or bleed had occurred. Based on a nonfocal neurologic exam could not declare this to be a postconcussion syndrome.  2) the patient carries a diagnosis of excessive daytime sleepiness, and had been evaluated at [facility] by Dr. Lauraine Rinne. While there was no indication for apnea in January 2015, polysomnography was not repeated from the patient underwent an M SLT and she was not asked to wean off REM suppressant medication.  This in context with the review of systems which includes excessive daytime sleepiness, snoring, depression and anxiety I am not sure how to work the patient up further.   We performed a HLA narcolepsy test- this was positive.  . Patient has felt that there has been an improvement in daytime alertness under the use of Nuvigil and  she would like to continue taking it.   Plan:  Treatment plan and additional workup : No change in antidepressant medication.  Nuvigil.  Neuropsychological battery at [facility].    Asencion Partridge Riaz Onorato MD 09/03/2017

## 2017-09-05 ENCOUNTER — Ambulatory Visit (HOSPITAL_COMMUNITY): Payer: 59 | Admitting: Psychiatry

## 2017-09-07 ENCOUNTER — Other Ambulatory Visit (HOSPITAL_COMMUNITY): Payer: Self-pay | Admitting: Psychiatry

## 2017-09-07 DIAGNOSIS — F319 Bipolar disorder, unspecified: Secondary | ICD-10-CM

## 2017-09-10 ENCOUNTER — Ambulatory Visit: Payer: Self-pay | Admitting: Neurology

## 2017-09-11 ENCOUNTER — Telehealth: Payer: Self-pay | Admitting: Neurology

## 2017-09-11 NOTE — Telephone Encounter (Signed)
PA sent through cover [facility]s [facility] KEY: [id number] . Will wait for response.

## 2017-09-11 NOTE — Telephone Encounter (Signed)
PA approved from 08/12/2017-09/11/2018

## 2017-09-25 ENCOUNTER — Ambulatory Visit (INDEPENDENT_AMBULATORY_CARE_PROVIDER_SITE_OTHER): Payer: 59 | Admitting: Family Medicine

## 2017-09-25 ENCOUNTER — Encounter: Payer: Self-pay | Admitting: Surgical

## 2017-09-25 VITALS — BP 124/86 | HR 78 | Temp 98.1°F | Ht 69.0 in | Wt 232.0 lb

## 2017-09-25 DIAGNOSIS — M5416 Radiculopathy, lumbar region: Secondary | ICD-10-CM

## 2017-09-25 MED ORDER — HYDROCODONE-ACETAMINOPHEN 5-325 MG PO TABS: 1.0000 | ORAL_TABLET | Freq: Four times a day (QID) | ORAL | 0 refills | Status: DC | PRN

## 2017-09-25 MED ORDER — PREDNISONE 5 MG PO TABS: ORAL_TABLET | ORAL | 0 refills | Status: DC

## 2017-09-25 NOTE — Progress Notes (Signed)
Robin [name] is a 52 y.o. female here for an acute visit.  History of Present Illness:   Shaune Pascal CMA acting as scribe for Dr. Juleen China  Low back pain: Patient comes in today for lower left back pain that radiates down the leg. Patient denies any injury to the back. She has had the pain for two days. The pain level is at a 9-10 at this point. She has been taking Aleve and using ice without any relief.   PMHx, SurgHx, SocialHx, Medications, and Allergies were reviewed in the Visit Navigator and updated as appropriate.  Current Medications:   .  Armodafinil (NUVIGIL) 250 MG tablet, Take 1 tablet (250 mg total) by mouth daily., Disp: 30 tablet, Rfl: 5 .  Cholecalciferol (VITAMIN D3) 50000 units CAPS, TAKE ONE CAPSULE BY MOUTH EVERY WEEK, Disp: 8 capsule, Rfl: 0 .  Cholecalciferol 50000 units TABS, 50,000 units PO qwk for 12 weeks., Disp: 12 tablet, Rfl: 0 .  EPINEPHrine 0.3 mg/0.3 mL IJ SOAJ injection, as needed. INJECT 0.3 MLS (0.3 MG TOTAL) INTO THE MUSCLE ONCE, Disp: , Rfl: 0 .  lamoTRIgine (LAMICTAL) 100 MG tablet, TAKE 1 TABLET BY MOUTH EVERY DAY, Disp: 15 tablet, Rfl: 0 .  LATUDA 40 MG TABS tablet, TAKE 1 TABLET (40 MG TOTAL) BY MOUTH DAILY WITH BREAKFAST., Disp: 30 tablet, Rfl: 0 .  Omega-3 Fatty Acids (FISH OIL) 1000 MG CAPS, Take 1 capsule (1,000 mg total) by mouth daily., Disp: 90 capsule, Rfl: 1 .  ondansetron (ZOFRAN ODT) 8 MG disintegrating tablet, Take 1 tablet (8 mg total) by mouth every 8 (eight) hours as needed for nausea or vomiting., Disp: 20 tablet, Rfl: 0 .  QUEtiapine (SEROQUEL XR) 200 MG 24 hr tablet, TAKE 1 TABLET (200 MG TOTAL) BY MOUTH AT BEDTIME., Disp: 30 tablet, Rfl: 0 .  SUMAtriptan (IMITREX) 100 MG tablet, Take 50-100 mg by mouth as directed. at onset of headache and may repeat once in 2 hours if needed, Disp: , Rfl: 11 .  Vitamin D, Cholecalciferol, 1000 units CAPS, Take 1,000 Units by mouth daily., Disp: 60 capsule, Rfl: 1   Allergies  Allergen  Reactions  . Aspirin Nausea And Vomiting and Rash  . Bee Venom Anaphylaxis  . Penicillins Anaphylaxis  . Bactrim [Sulfamethoxazole-Trimethoprim] Nausea And Vomiting  . Sulfa Antibiotics Nausea And Vomiting   Review of Systems:   Pertinent items are noted in the HPI. Otherwise, ROS is negative.  Vitals:   Vitals:   09/25/17 1432  BP: 124/86  Pulse: 78  Temp: 98.1 F (36.7 C)  TempSrc: Oral  SpO2: 97%  Weight: 232 lb (105.2 kg)  Height: 5\' 9"  (1.753 m)     Body mass index is 34.26 kg/m.  Physical Exam:   Physical Exam  Constitutional: She appears well-nourished.  HENT:  Head: Normocephalic and atraumatic.  Eyes: EOM are normal. Pupils are equal, round, and reactive to light.  Neck: Normal range of motion. Neck supple.  Cardiovascular: Normal rate, regular rhythm, normal heart sounds and intact distal pulses.  Pulmonary/Chest: Effort normal.  Abdominal: Soft.  Musculoskeletal:       Lumbar back: She exhibits decreased range of motion, bony tenderness and spasm.  + SLR left.  Skin: Skin is warm.  Psychiatric: She has a normal mood and affect. Her behavior is normal.  Nursing note and vitals reviewed.   Assessment and Plan:   1. Lumbar radiculitis Acute onset, but flares happening much more often and lasting longer. Endorsing numbness of  left foot. Treatment as below. Will also go ahead and order and MRI at this point.  - HYDROcodone-acetaminophen (NORCO/VICODIN) 5-325 MG tablet; Take 1 tablet by mouth every 6 (six) hours as needed for moderate pain.  Dispense: 30 tablet; Refill: 0 - predniSONE (DELTASONE) 5 MG tablet; 6-5-4-3-2-1-off  Dispense: 21 tablet; Refill: 0 - MR Lumbar Spine Wo Contrast; Future   . Reviewed expectations re: course of current medical issues. . Discussed self-management of symptoms. . Outlined signs and symptoms indicating need for more acute intervention. . Patient verbalized understanding and all questions were answered. [facility]  Maintenance issues including appropriate healthy diet, exercise, and smoking avoidance were discussed with patient. . See orders for this visit as documented in the electronic medical record. . Patient received an After Visit Summary.  CMA served as Education administrator during this visit. History, Physical, and Plan performed by medical provider. The above documentation has been reviewed and is accurate and complete. Briscoe Deutscher, D.O.  Briscoe Deutscher, DO [place] 09/26/2017

## 2017-09-26 ENCOUNTER — Ambulatory Visit (HOSPITAL_COMMUNITY): Payer: 59 | Admitting: Psychiatry

## 2017-09-26 ENCOUNTER — Encounter: Payer: Self-pay | Admitting: Family Medicine

## 2017-09-30 ENCOUNTER — Ambulatory Visit: Payer: Self-pay | Admitting: Internal Medicine

## 2017-10-07 ENCOUNTER — Other Ambulatory Visit: Payer: Self-pay

## 2017-10-08 ENCOUNTER — Ambulatory Visit
Admission: RE | Admit: 2017-10-08 | Discharge: 2017-10-08 | Disposition: A | Discharge status: Home / Self Care | Payer: 59 | Source: Ambulatory Visit | Attending: Family Medicine | Admitting: Family Medicine

## 2017-10-08 DIAGNOSIS — M5416 Radiculopathy, lumbar region: Secondary | ICD-10-CM

## 2017-10-10 ENCOUNTER — Encounter (HOSPITAL_COMMUNITY): Payer: Self-pay | Admitting: Medical

## 2017-10-10 ENCOUNTER — Ambulatory Visit (INDEPENDENT_AMBULATORY_CARE_PROVIDER_SITE_OTHER): Payer: 59 | Admitting: Medical

## 2017-10-10 VITALS — BP 112/68 | HR 77 | Ht 70.0 in | Wt 229.0 lb

## 2017-10-10 DIAGNOSIS — M5136 Other intervertebral disc degeneration, lumbar region: Secondary | ICD-10-CM | POA: Insufficient documentation

## 2017-10-10 DIAGNOSIS — G47419 Narcolepsy without cataplexy: Secondary | ICD-10-CM

## 2017-10-10 DIAGNOSIS — Z8659 Personal history of other mental and behavioral disorders: Secondary | ICD-10-CM | POA: Diagnosis not present

## 2017-10-10 DIAGNOSIS — F319 Bipolar disorder, unspecified: Secondary | ICD-10-CM

## 2017-10-10 DIAGNOSIS — Z79899 Other long term (current) drug therapy: Secondary | ICD-10-CM

## 2017-10-10 DIAGNOSIS — Z818 Family history of other mental and behavioral disorders: Secondary | ICD-10-CM

## 2017-10-10 MED ORDER — QUETIAPINE FUMARATE ER 200 MG PO TB24: 200.0000 mg | ORAL_TABLET | Freq: Every day | ORAL | 0 refills | Status: DC

## 2017-10-10 MED ORDER — TRAZODONE HCL 50 MG PO TABS: ORAL_TABLET | ORAL | 1 refills | Status: DC

## 2017-10-10 MED ORDER — LAMOTRIGINE 100 MG PO TABS
ORAL_TABLET | ORAL | 0 refills | Status: DC
Start: 2017-10-10 — End: 2021-01-14

## 2017-10-10 MED ORDER — LATUDA 40 MG PO TABS: 40.0000 mg | ORAL_TABLET | Freq: Every day | ORAL | 0 refills | Status: DC

## 2017-10-10 NOTE — Progress Notes (Signed)
Psychiatric Initial Adult Assessment   Patient Identification: Robin [name] MRN:  [id number] Date of Evaluation:  10/10/2017 Referral Source: Wife is patient/referred her Chief Complaint:   Chief Complaint    Establish Care; Medication Refill; Manic Behavior; Sleep disorder     Visit Diagnosis:    ICD-10-CM   1. Bipolar I disorder (HCC) F31.9 lamoTRIgine (LAMICTAL) 100 MG tablet    LATUDA 40 MG TABS tablet    QUEtiapine (SEROQUEL XR) 200 MG 24 hr tablet  2. History of borderline personality disorder Z86.59   3. Primary narcolepsy without cataplexy G47.419   4. DDD (degenerative disc disease), lumbar M51.36     History of Present Illness:  52 y/o BF pt of Dr Arnoldo Lenis [facility] since 2016 for treatment of Bipolar disorede choosing to change providers for personal convenience/family here also. She is stable on current medications. She has developed a recent problem with low back pain /"a pinched nerve" for which she underwent MRI 2/12 that showed mild Lumbar DDD., In January she was reassessed by Neurology for 2015 diagnosis of Narcolepsy and an episode of syncpo ope/?cataplexy in August of 2018. She also had a hx of sleep Apnea. She was found to have Narcolepsy without cataplexy. Associated Signs/Symptoms: Depression Symptoms:Improved on medication   depressed mood,1/3 anhedonia,1/3 insomnia,1/3 hypersomnia,1/3 difficulty concentrating,2/3 impaired memory,2/3 Neuropsych studies ordered by Neurology Anxiety,1/3 loss of energy/fatigue,2/3 disturbed sleep,1/3 increased appetite,1/3 (Hypo) Manic Symptoms:Improved on medication   Distractibility,1/3 Irritable Mood,1/3/ Labiality of Mood,1/3 Anxiety Symptoms:  Excessive Worry, Psychotic Symptoms:  None PTSD Symptoms:Negative   Past Psychiatric History:  Diagnosis: Bipolar d/o, Borderline PD  Hospitalizations: denies  Outpatient Care: [facility] in [place]- Dr. Charlean Merl; Yetta Barre [facility]  10/2014 to 10/10/2017  Substance Abuse Care: denies  Self-Mutilation: denies  Suicidal Attempts: denies, has access to guns- they are locked up  Violent Behaviors: denies    Previous Psychotropic Medications: Trazodone;Seroquel XR;Lamictal;Latuda  Substance Abuse History in the last 12 months:  No.  Consequences of Substance Abuse: NA  Past Medical History:  [facility] 09/2017 Problem Noted Date  Narcolepsy due to underlying condition without cataplexy 07/18/2016  Chronic pansinusitis 01/27/2016  Nasal turbinate hypertrophy 01/27/2016  History of colonoscopy with polypectomy 04/14/2015  Overview:   03/2015, tubular adenoma, repeat 5 years   Bipolar I disorder, most recent episode depressed (CMS-HCC) 09/03/2014  Obesity 12/23/2013  Problems influencing health status 12/23/2013  Depressive disorder 11/25/2013  Overview:   IMPRESSION: on latuda and seroquel that may be REM suppressants, however RO=60.5 in PSG   Hypersomnia 11/25/2013  Overview:   IMPRESSION: 09/03/2013 ESS=21 09/24/2013 22 MSLT 11-10-13 was 3.9 min (8.0, 4.5, 5.5, 1.5, and 0) with REM in nap 4. 11/25/2013 Nuvigil 250 IMPRESSION: 09/03/2013 ESS=21 09/24/2013 22 MSLT 11-10-13 was 3.9 min (8.0, 4.5, 5.5, 1.5, and 0) with REM in nap 4. 11/25/2013 Nuvigil 250   Sleep apnea 11/25/2013  Migraine 10/28/2013  Overview:   STORY: hx of maxalt, imitrex. MRI, (10/27/11) [facility]. negative.beta blocker. seen by [facility].11/14.`E1o3L`IMPRESSION: History of the same, not responsive to migraine and other meds, currently working with neurologist to get Botox approved. In the meantime, try toradol and promethazine, pain Rx if needed.   Sleep disturbance 07/06/2013  Overview:   IMPRESSION: epworth sleepiness scale 16. `E1o3L`has had discussion in past for sleep apnea study in last year at [facility], will refer to Dr Tonia Ghent to discuss and sch, ? if can do this at home. `E1o3L`she is to con't weight loss efforts. sleep  on side, head of  bed elevated, discuss meds with psychiatrist as well.   Impaired cognition 04/03/2013  [facility] 09/2017 Stomach ulcer   Depression   Bipolar depression    [facility]  09/2017 Chronic pansinusitis 01/27/2016  Nasal turbinate hypertrophy   [facility] Past Medical History:  Diagnosis Date  . Asthma   . Bipolar disorder ([place])   . Gestational hypertension   . HA (headache)   . History of borderline personality disorder   . Hypersomnia, persistent 04/29/2013  . IBS (irritable bowel syndrome)   . Memory loss, short term    [name] 04-03-13   . Migraine   . Seasonal allergies     Past Surgical History:  Procedure Laterality Date  . ABDOMINAL HYSTERECTOMY  00  . KNEE SURGERY Right 96,98,00,02,05,2014  . NASAL SINUS SURGERY    . NOSE SURGERY     [phone]  . WRIST SURGERY Right [phone]    Family Psychiatric History: Brother schizophrenic  Family History:  Family History  Problem Relation Age of Onset  . Depression Mother   . High Cholesterol Mother   . Hypertension Mother   . Heart failure Mother   . Hypertension Father   . High blood pressure Maternal Grandfather   . Heart attack Maternal Grandfather   . Breast cancer Maternal Grandmother   . Diabetes Maternal Grandmother   . High blood pressure Maternal Grandmother   . Heart disease Maternal Grandmother   . Breast cancer Sister   . Schizophrenia Brother     Social History:   Social History   Socioeconomic History  . Marital status: Married    Spouse name: [name]  . Number of children: 2  . Years of education: AA  . Highest education level: None  Social Needs  . Financial resource strain: None  . Food insecurity - worry: None  . Food insecurity - inability: None  . Transportation needs - medical: None  . Transportation needs - non-medical: None  Occupational History    Employer: [employer]    Comment: [place]  Tobacco Use  . Smoking status: Never Smoker  . Smokeless tobacco: Never Used   Substance and Sexual Activity  . Alcohol use: No    Comment: 2-3 drinks per year  . Drug use: No  . Sexual activity: None  Other Topics Concern  . None  Social History Narrative   Pneumonia vaccine 2005   Patient lives at home with wife and 2 sons.    Patient works in [place].    Patient has her Associates degree   Current Place of Residence: [place] with wife and 2 kids Place of Birth: [place], [place] and father was [place] so family moved around a lot. Raised by parents, her childhood was lonely and parents were distant Family Members: parents, 2 brothers Marital Status:  Married 4 yrs Children: 2             Sons: 2             Daughters: 0 Relationships: support from wife Education:  working on 4 yr degree at [employer]. she will transfer to [employer]. Educational Problems/Performance: good Religious Beliefs/Practices: denies History of Abuse: emotional (parents) Occupational Experiences: Librarian, academic, officer retired in [employer] History:  None. Legal History: denies Hobbies/Interests: fishing, bowling, Video games          Additional Social History: See Narrative Allergies:   Allergies  Allergen Reactions  . Aspirin Nausea And Vomiting and Rash  . Bee  Venom Anaphylaxis  . Penicillins Anaphylaxis  . Bactrim [Sulfamethoxazole-Trimethoprim] Nausea And Vomiting  . Sulfa Antibiotics Nausea And Vomiting    Metabolic Disorder Labs: Lab Results  Component Value Date   HGBA1C 5.2 03/18/2017   MPG 103 03/18/2017   MPG 117 (H) 12/08/2014   Lab Results  Component Value Date   PROLACTIN 12.1 03/18/2017   PROLACTIN 11.4 12/08/2014   Lab Results  Component Value Date   CHOL 169 03/18/2017   TRIG 140 03/18/2017   HDL 39 (L) 03/18/2017   CHOLHDL 4.3 03/18/2017   VLDL 28 03/18/2017   LDLCALC 102 (H) 03/18/2017   LDLCALC 120 (H) 12/08/2014     Current Medications: Current Outpatient Medications  Medication Sig Dispense Refill  . Armodafinil (NUVIGIL) 250 MG tablet  Take 1 tablet (250 mg total) by mouth daily. 30 tablet 5  . Cholecalciferol (VITAMIN D3) 50000 units CAPS TAKE ONE CAPSULE BY MOUTH EVERY WEEK 8 capsule 0  . Cholecalciferol 50000 units TABS 50,000 units PO qwk for 12 weeks. 12 tablet 0  . EPINEPHrine 0.3 mg/0.3 mL IJ SOAJ injection as needed. INJECT 0.3 MLS (0.3 MG TOTAL) INTO THE MUSCLE ONCE  0  . HYDROcodone-acetaminophen (NORCO/VICODIN) 5-325 MG tablet Take 1 tablet by mouth every 6 (six) hours as needed for moderate pain. 30 tablet 0  . lamoTRIgine (LAMICTAL) 100 MG tablet TAKE 1 TABLET BY MOUTH EVERY DAY 90 tablet 0  . LATUDA 40 MG TABS tablet Take 1 tablet (40 mg total) by mouth daily with breakfast. 90 tablet 0  . Omega-3 Fatty Acids (FISH OIL) 1000 MG CAPS Take 1 capsule (1,000 mg total) by mouth daily. 90 capsule 1  . ondansetron (ZOFRAN ODT) 8 MG disintegrating tablet Take 1 tablet (8 mg total) by mouth every 8 (eight) hours as needed for nausea or vomiting. 20 tablet 0  . predniSONE (DELTASONE) 5 MG tablet 6-5-4-3-2-1-off 21 tablet 0  . QUEtiapine (SEROQUEL XR) 200 MG 24 hr tablet Take 1 tablet (200 mg total) by mouth at bedtime. 90 tablet 0  . SUMAtriptan (IMITREX) 100 MG tablet Take 50-100 mg by mouth as directed. at onset of headache and may repeat once in 2 hours if needed  11  . Vitamin D, Cholecalciferol, 1000 units CAPS Take 1,000 Units by mouth daily. 60 capsule 1  . doxycycline (VIBRAMYCIN) 50 MG capsule TK 1 C PO BID  0  . fluorometholone (FML) 0.1 % ophthalmic suspension PLACE ONE DROP IN OU TID UTD  0  . traZODone (DESYREL) 50 MG tablet TAKE 50-100MG  BY MOUTH AT BEDTIME AS NEEDED INSOMNIA 60 tablet 1   No current facility-administered medications for this visit.     Neurologic: Headache: Hx of migraine Seizure: Negative Paresthesias:Negative  Musculoskeletal: Strength & Muscle Tone: within normal limits and recent hx LBP Gait & Station: Hx of low back pain Patient leans: N/A  Psychiatric Specialty Exam: Review  of Systems  Constitutional: Positive for malaise/fatigue. Negative for chills, diaphoresis, fever and weight loss.  HENT: Negative for congestion, ear discharge, ear pain, hearing loss, nosebleeds, sinus pain, sore throat and tinnitus.   Eyes: Negative for blurred vision, double vision, photophobia, pain, discharge and redness.  Respiratory: Negative for cough, hemoptysis, sputum production, shortness of breath, wheezing and stridor.        Hx of asthma  Cardiovascular: Negative for chest pain, palpitations, orthopnea and claudication.  Gastrointestinal: Negative for abdominal pain, blood in stool, constipation, diarrhea, heartburn, melena, nausea and vomiting.  Genitourinary: Negative for dysuria,  flank pain, frequency, hematuria and urgency.  Musculoskeletal: Positive for back pain, falls and joint pain. Negative for myalgias and neck pain.  Skin: Negative for itching and rash.  Neurological: Positive for dizziness and loss of consciousness. Negative for tingling, tremors, sensory change, speech change, focal weakness, seizures, weakness and headaches.  Endo/Heme/Allergies: Negative for environmental allergies and polydipsia. Does not bruise/bleed easily.  Psychiatric/Behavioral: Positive for depression and memory loss. The patient is nervous/anxious and has insomnia.         Bipolar C/O cognitive and memory changes-Neuropsych studies ordered    Blood pressure 112/68, pulse 77, height 5\' 10"  (1.778 m), weight 229 lb (103.9 kg).Body mass index is 32.86 kg/m.  General Appearance: Neat and Well Groomed  Eye Contact:  Good  Speech:  Clear and Coherent  Volume:  Normal  Mood:  Euthymic  Affect:  Congruent  Thought Process:  Coherent and Descriptions of Associations: Intact  Orientation:  Full (Time, Place, and Person)  Thought Content:  WDL and Logical  Suicidal Thoughts:  No  Homicidal Thoughts:  No  Memory:  C/O of loss  Judgement:  Intact  Insight:  Present  Psychomotor Activity:   Normal  Concentration:  Concentration: Good and Attention Span: Good  Recall:  Good  Fund of Knowledge:Good  Language: Good  Akathisia:  NA  Handed:  Right  AIMS (if indicated):  Grossly WNL  Assets:  Communication Skills Desire for Improvement Financial Resources/Insurance Housing Intimacy Leisure Time Resilience Social Support Talents/Skills Transportation Vocational/Educational  ADL's:  Intact  Cognition: c/o impairment Neurology following  Sleep:  Narcolepsy without cataplexy    Treatment Plan Summary: Med Management: Medications: Continue Lamictal 100mg  po qD for mood Latuda 40mg  po qD for mood Seroquel XR 300mg  po qHS for mood  Psychtherapy encouraged to continue therapy with Gevena Barre  FU 3 months -sooner if needed Call if problems  Darlyne Russian, PA-C 2/14/20194:05 PM

## 2017-10-15 DIAGNOSIS — M79605 Pain in left leg: Secondary | ICD-10-CM | POA: Diagnosis not present

## 2017-10-15 DIAGNOSIS — M545 Low back pain: Secondary | ICD-10-CM | POA: Diagnosis not present

## 2017-10-18 ENCOUNTER — Ambulatory Visit: Payer: Self-pay | Admitting: Internal Medicine

## 2017-10-28 ENCOUNTER — Ambulatory Visit: Payer: 59 | Admitting: Neurology

## 2017-10-29 DIAGNOSIS — M5136 Other intervertebral disc degeneration, lumbar region: Secondary | ICD-10-CM | POA: Diagnosis not present

## 2017-10-31 ENCOUNTER — Other Ambulatory Visit: Payer: Self-pay | Admitting: Family Medicine

## 2017-11-12 DIAGNOSIS — M5136 Other intervertebral disc degeneration, lumbar region: Secondary | ICD-10-CM | POA: Diagnosis not present

## 2017-12-03 DIAGNOSIS — M5136 Other intervertebral disc degeneration, lumbar region: Secondary | ICD-10-CM | POA: Diagnosis not present

## 2017-12-10 DIAGNOSIS — M5136 Other intervertebral disc degeneration, lumbar region: Secondary | ICD-10-CM | POA: Diagnosis not present

## 2017-12-17 ENCOUNTER — Encounter: Payer: Self-pay | Admitting: Psychology

## 2017-12-31 ENCOUNTER — Encounter: Payer: Self-pay | Admitting: Psychology

## 2018-01-02 ENCOUNTER — Ambulatory Visit (HOSPITAL_COMMUNITY): Payer: Self-pay | Admitting: Medical

## 2018-01-02 ENCOUNTER — Other Ambulatory Visit (HOSPITAL_COMMUNITY): Payer: Self-pay | Admitting: Medical

## 2018-01-06 ENCOUNTER — Telehealth (HOSPITAL_COMMUNITY): Payer: Self-pay

## 2018-01-06 NOTE — Telephone Encounter (Signed)
Pt should have appt this Thursday in [place] for FU ?

## 2018-01-06 NOTE — Telephone Encounter (Signed)
Patient had an apt last week (5/9) to see Robin [name]. Patient called and canceled and refused to rschd.

## 2018-01-06 NOTE — Telephone Encounter (Signed)
Received a call from the pharmacy for a refill request on Trazodone 50mg . Please advise

## 2018-01-07 ENCOUNTER — Other Ambulatory Visit (HOSPITAL_COMMUNITY): Payer: Self-pay | Admitting: Medical

## 2018-01-07 DIAGNOSIS — F319 Bipolar disorder, unspecified: Secondary | ICD-10-CM

## 2018-01-08 NOTE — Telephone Encounter (Signed)
Contact pt and explain I cannot refill meds without seeing her. If she doesnt want to be seen inform her I am sorry but that I will need to discharge her

## 2018-02-06 ENCOUNTER — Other Ambulatory Visit (HOSPITAL_COMMUNITY): Payer: Self-pay | Admitting: Medical

## 2018-02-06 DIAGNOSIS — F319 Bipolar disorder, unspecified: Secondary | ICD-10-CM

## 2018-02-07 ENCOUNTER — Other Ambulatory Visit (HOSPITAL_COMMUNITY): Payer: Self-pay | Admitting: Psychiatry

## 2018-02-07 DIAGNOSIS — F319 Bipolar disorder, unspecified: Secondary | ICD-10-CM

## 2018-03-04 ENCOUNTER — Telehealth: Payer: Self-pay | Admitting: Adult Health

## 2018-03-04 ENCOUNTER — Ambulatory Visit: Payer: 59 | Admitting: Adult Health

## 2018-03-04 NOTE — Telephone Encounter (Signed)
FYI

## 2018-03-04 NOTE — Telephone Encounter (Signed)
Pt said she got a call regarding her appt today. Pt advised she has moved to [place]. I didin't get a current address, please do not send to address in [place].  FYI

## 2018-03-04 NOTE — Telephone Encounter (Signed)
Noted  

## 2018-03-05 ENCOUNTER — Encounter: Payer: Self-pay | Admitting: Adult Health

## 2019-07-27 ENCOUNTER — Other Ambulatory Visit: Payer: Self-pay

## 2019-07-28 ENCOUNTER — Encounter: Payer: Self-pay | Admitting: Family Medicine

## 2019-07-28 ENCOUNTER — Ambulatory Visit (INDEPENDENT_AMBULATORY_CARE_PROVIDER_SITE_OTHER): Payer: 59 | Admitting: Family Medicine

## 2019-07-28 VITALS — BP 126/84 | HR 64 | Temp 98.1°F | Ht 69.0 in | Wt 221.0 lb

## 2019-07-28 DIAGNOSIS — M25552 Pain in left hip: Secondary | ICD-10-CM | POA: Diagnosis not present

## 2019-07-28 DIAGNOSIS — M25521 Pain in right elbow: Secondary | ICD-10-CM

## 2019-07-28 DIAGNOSIS — M25561 Pain in right knee: Secondary | ICD-10-CM | POA: Diagnosis not present

## 2019-07-28 MED ORDER — MELOXICAM 15 MG PO TABS: 15.0000 mg | ORAL_TABLET | Freq: Every day | ORAL | 0 refills | Status: DC

## 2019-07-28 MED ORDER — METHYLPREDNISOLONE ACETATE 80 MG/ML IJ SUSP
80.0000 mg | Freq: Once | INTRAMUSCULAR | Status: AC
  Administered 2019-07-28: 80 mg via INTRAMUSCULAR

## 2019-07-28 NOTE — Progress Notes (Signed)
   Chief Complaint:  Robin [name] is a 53 y.o. female who presents today with a chief complaint of pain.   Assessment/Plan:  Pain No red flags.  Likely has inflammation due to recent move.  I have mild olecranon bursitis and right elbow and trochanteric bursitis and left hip.  We will give 80 mg of Depo-Medrol today.  Start Mobic as well.  If no improvement will need referral to orthopedics.  Discussed home exercise program and handout was given.    Subjective:  HPI:  Pain  Started about 2 weeks ago. Located in right knee, right elbow, and left hip. Patient recently moved here from [place] and has been moving a lot of boxes.  She has tried using Aleve and tramadol with no improvement.  No swelling.  Worse at night.  Worse with movements.  No other treatments tried.  Pain worse in left hip.  Has a history of right knee replacement.  No other obvious aggravating or alleviating factors.   ROS: Per HPI  PMH: She reports that she has never smoked. She has never used smokeless tobacco. She reports that she does not drink alcohol or use drugs.      Objective:  Physical Exam: Pulse 64   Temp 98.1 F (36.7 C) (Temporal)   Ht 5\' 9"  (1.753 m)   Wt 221 lb (100.2 kg)   SpO2 100%   BMI 32.64 kg/m   Gen: NAD, resting comfortably CV: Regular rate and rhythm with no murmurs appreciated Pulm: Normal work of breathing, clear to auscultation bilaterally with no crackles, wheezes, or rhonchi MSK: -Right elbow: No deformities.  Tender to palpation along distal insertion of triceps.  Pain elicited with resisted elbow extension -Right knee: Surgical scars noted from prior TKA.  Full range of motion.  Tender to palpation along lateral joint line -Left hip: No deformities.  Tender to palpation along greater trochanter.  Pain elicited with resisted hip flexion and abduction.     Algis Greenhouse. Jerline Pain, MD 07/28/2019 1:06 PM

## 2019-07-28 NOTE — Patient Instructions (Signed)
It was very nice to see you today!  We will give you an injection of cortisone today.  Please start the meloxicam.  Let me know if worsening or not improving the next few days.  Take care, Dr Jerline Pain  Please try these tips to maintain a healthy lifestyle:   Eat at least 3 REAL meals and 1-2 snacks per day.  Aim for no more than 5 hours between eating.  If you eat breakfast, please do so within one hour of getting up.    Obtain twice as many fruits/vegetables as protein or carbohydrate foods for both lunch and dinner. (Half of each meal should be fruits/vegetables, one quarter protein, and one quarter starchy carbs)   Cut down on sweet beverages. This includes juice, soda, and sweet tea.    Exercise at least 150 minutes every week.

## 2019-08-25 ENCOUNTER — Other Ambulatory Visit: Payer: Self-pay | Admitting: Family Medicine

## 2019-09-03 ENCOUNTER — Other Ambulatory Visit: Payer: Self-pay

## 2019-09-03 ENCOUNTER — Encounter: Payer: Self-pay | Admitting: Family Medicine

## 2019-09-03 ENCOUNTER — Other Ambulatory Visit: Payer: Self-pay | Admitting: Family Medicine

## 2019-09-03 ENCOUNTER — Telehealth: Payer: Self-pay | Admitting: Family Medicine

## 2019-09-03 DIAGNOSIS — Z1231 Encounter for screening mammogram for malignant neoplasm of breast: Secondary | ICD-10-CM

## 2019-09-03 MED ORDER — EPINEPHRINE 0.3 MG/0.3ML IJ SOAJ: 0.3000 mg | INTRAMUSCULAR | 0 refills | Status: DC | PRN

## 2019-09-03 NOTE — Telephone Encounter (Signed)
Robin [name] called in wondering if you could give another Epipen to her because the one she has expired, she also wanted to know if it was possible to put in a referral for Neuro. Patient scheduled a new patient appt for 10/28/19

## 2019-09-04 ENCOUNTER — Other Ambulatory Visit: Payer: Self-pay

## 2019-09-04 ENCOUNTER — Ambulatory Visit (INDEPENDENT_AMBULATORY_CARE_PROVIDER_SITE_OTHER): Payer: 59 | Admitting: Cardiology

## 2019-09-04 ENCOUNTER — Ambulatory Visit
Admission: RE | Admit: 2019-09-04 | Discharge: 2019-09-04 | Disposition: A | Discharge status: Home / Self Care | Payer: 59 | Source: Ambulatory Visit

## 2019-09-04 ENCOUNTER — Encounter: Payer: Self-pay | Admitting: Cardiology

## 2019-09-04 VITALS — BP 116/80 | HR 57 | Ht 69.0 in | Wt 225.4 lb

## 2019-09-04 DIAGNOSIS — R002 Palpitations: Secondary | ICD-10-CM | POA: Diagnosis not present

## 2019-09-04 DIAGNOSIS — R0789 Other chest pain: Secondary | ICD-10-CM | POA: Diagnosis not present

## 2019-09-04 DIAGNOSIS — Z8249 Family history of ischemic heart disease and other diseases of the circulatory system: Secondary | ICD-10-CM | POA: Diagnosis not present

## 2019-09-04 DIAGNOSIS — Z1231 Encounter for screening mammogram for malignant neoplasm of breast: Secondary | ICD-10-CM

## 2019-09-04 NOTE — Telephone Encounter (Signed)
Is it ok to put in referral for Neuro,she has memory issues,I didn't see any info in the  notes from 12/01

## 2019-09-04 NOTE — Telephone Encounter (Signed)
[place] with referral.  Algis Greenhouse. Jerline Pain, MD 09/04/2019 1:04 PM

## 2019-09-04 NOTE — Progress Notes (Signed)
Cardiology Office Note:    Date:  09/04/2019   ID:  Robin [name], DOB 08-02-1966, MRN [id number]  PCP:  [name], MD  Cardiologist:  Candee Furbish, MD  Electrophysiologist:  None   Referring MD: No ref. provider found     History of Present Illness:    Robin [name] is a 54 y.o. female with here for follow-up after ER visits in [place] for palpitations and chest discomfort.  Unremarkable work-up.  Troponin was normal.  She did get a GI cocktail and this helped relieve the discomfort.  She has not had any further discomfort.  She does have a family history of heart disease and would like to continue to monitor this.  Last LDL 101 in our system.  2 years ago had a syncopal work-up where she was bent over to tie her shoe and went out for several minutes she states.  EVENT MONITOR 2018:  The patient was monitored for 30 days; 92% of the monitoring period yielded diagnostic tracings.  The predominant rhythm was sinus with an average rate of 87 bpm (range 59-185 bpm).  Narrow-complex tachycardia with heart rate up to 185 bpm on 08/05/17 appears to be sinus tachycardia.  Intermittent artifact is present.  No significant ectopy, sustained arrhythmia, or prolonged pause is identified.  There were no symptoms reported by the patient.   Predominantly sinus rhythm; episode of narrow-complex tachycardia most likely represents sinus tachycardia. No sustained arrhythmia or prolonged pause.  This has not reoccurred.  Past Medical History:  Diagnosis Date  . Asthma   . Bipolar disorder ([place])   . Gestational hypertension   . HA (headache)   . History of borderline personality disorder   . Hypersomnia, persistent 04/29/2013  . IBS (irritable bowel syndrome)   . Memory loss, short term    [name] 04-03-13   . Migraine   . Seasonal allergies     Past Surgical History:  Procedure Laterality Date  . ABDOMINAL HYSTERECTOMY  00  . KNEE SURGERY Right 96,98,00,02,05,2014  . NASAL  SINUS SURGERY    . NOSE SURGERY     [id number]  . WRIST SURGERY Right [phone]    Current Medications: Current Meds  Medication Sig  . EPINEPHrine 0.3 mg/0.3 mL IJ SOAJ injection Inject 0.3 mLs (0.3 mg total) into the muscle as needed for anaphylaxis. INJECT 0.3 MLS (0.3 MG TOTAL) INTO THE MUSCLE ONCE  . lamoTRIgine (LAMICTAL) 100 MG tablet TAKE 1 TABLET BY MOUTH EVERY DAY  . LATUDA 40 MG TABS tablet Take 1 tablet (40 mg total) by mouth daily with breakfast.  . meloxicam (MOBIC) 15 MG tablet TAKE 1 TABLET(15 MG) BY MOUTH DAILY  . Omega-3 Fatty Acids (FISH OIL) 1000 MG CAPS Take 1 capsule (1,000 mg total) by mouth daily.  . ondansetron (ZOFRAN ODT) 8 MG disintegrating tablet Take 1 tablet (8 mg total) by mouth every 8 (eight) hours as needed for nausea or vomiting.  Marland Kitchen QUEtiapine (SEROQUEL XR) 200 MG 24 hr tablet Take 1 tablet (200 mg total) by mouth at bedtime.     Allergies:   Aspirin, Bee venom, Penicillins, Bactrim [sulfamethoxazole-trimethoprim], and Sulfa antibiotics   Social History   Socioeconomic History  . Marital status: Married    Spouse name: [name]  . Number of children: 2  . Years of education: AA  . Highest education level: Not on file  Occupational History    Employer: [place]: [employer]  Tobacco Use  .  Smoking status: Never Smoker  . Smokeless tobacco: Never Used  Substance and Sexual Activity  . Alcohol use: No    Comment: 2-3 drinks per year  . Drug use: No  . Sexual activity: Not on file  Other Topics Concern  . Not on file  Social History Narrative   Pneumonia vaccine 2005   Patient lives at home with wife and 2 sons.    Patient works in [place].    Patient has her AA            Social Determinants of Health   Financial Resource Strain:   . Difficulty of Paying Living Expenses: Not on file  Food Insecurity:   . Worried About Charity fundraiser in the Last Year: Not on file  . Ran Out of Food in the  Last Year: Not on file  Transportation Needs:   . Lack of Transportation (Medical): Not on file  . Lack of Transportation (Non-Medical): Not on file  Physical Activity:   . Days of Exercise per Week: Not on file  . Minutes of Exercise per Session: Not on file  Stress:   . Feeling of Stress : Not on file  Social Connections:   . Frequency of Communication with Friends and Family: Not on file  . Frequency of Social Gatherings with Friends and Family: Not on file  . Attends Religious Services: Not on file  . Active Member of Clubs or Organizations: Not on file  . Attends Archivist Meetings: Not on file  . Marital Status: Not on file     Family History: The patient's family history includes Breast cancer in her maternal grandmother and sister; Depression in her mother; Diabetes in her maternal grandmother; Heart attack in her maternal grandfather; Heart disease in her maternal grandmother; Heart failure in her mother; High Cholesterol in her mother; High blood pressure in her maternal grandfather and maternal grandmother; Hypertension in her father and mother; Schizophrenia in her brother.  ROS:   Please see the history of present illness.    No fevers chills nausea vomiting syncope bleeding all other systems reviewed and are negative.  EKGs/Labs/Other Studies Reviewed:    The following studies were reviewed today: Outside ER records and lab work reviewed as below  EKG:  EKG is  ordered today.  The ekg ordered today demonstrates sinus bradycardia 57 with no other abnormalities on 09/04/2019  Recent Labs: No results found for requested labs within last 8760 hours.  Recent Lipid Panel    Component Value Date/Time   CHOL 169 03/18/2017 1553   TRIG 140 03/18/2017 1553   HDL 39 (L) 03/18/2017 1553   CHOLHDL 4.3 03/18/2017 1553   VLDL 28 03/18/2017 1553   LDLCALC 102 (H) 03/18/2017 1553    Physical Exam:    VS:  BP 116/80   Pulse (!) 57   Ht 5\' 9"  (1.753 m)   Wt 225  lb 6.4 oz (102.2 kg)   BMI 33.29 kg/m     Wt Readings from Last 3 Encounters:  09/04/19 225 lb 6.4 oz (102.2 kg)  07/28/19 221 lb (100.2 kg)  09/25/17 232 lb (105.2 kg)     GEN:  Well nourished, well developed in no acute distress HEENT: Normal NECK: No JVD; No carotid bruits LYMPHATICS: No lymphadenopathy CARDIAC: RRR, no murmurs, rubs, gallops RESPIRATORY:  Clear to auscultation without rales, wheezing or rhonchi  ABDOMEN: Soft, non-tender, non-distended MUSCULOSKELETAL:  No edema; No deformity  SKIN: Warm and dry  NEUROLOGIC:  Alert and oriented x 3 PSYCHIATRIC:  Normal affect   ASSESSMENT:    1. Atypical chest pain   2. Palpitations   3. Family history of cardiac disorder    PLAN:    In order of problems listed above:    Palpitations/chest discomfort -To ER work-ups in [place] were unremarkable.  I reviewed personally the documentation and lab work, troponin was normal, electrolytes were normal.  EKG unremarkable. Check on lipid panel.  Syncope -Worked up 2 years ago, reassuring echocardiogram and monitor.  Did show sinus tachycardia 185.  No adverse arrhythmias.  No further issues with this in 2 years.  Continue to watch.  In 6 months have her come back in with APP.  12 with me.   Medication Adjustments/Labs and Tests Ordered: Current medicines are reviewed at length with the patient today.  Concerns regarding medicines are outlined above.  Orders Placed This Encounter  Procedures  . Lipid Profile  . EKG 12-Lead   No orders of the defined types were placed in this encounter.   Patient Instructions  Medication Instructions:  The current medical regimen is effective;  continue present plan and medications.  *If you need a refill on your cardiac medications before your next appointment, please call your pharmacy*  Lab Work: Please have blood work today (Lipid)  If you have labs (blood work) drawn today and your tests are completely normal, you will  receive your results only by: Marland Kitchen MyChart Message (if you have MyChart) OR . A paper copy in the mail If you have any lab test that is abnormal or we need to change your treatment, we will call you to review the results.  Follow-Up: At [facility], you and your health needs are our priority.  As part of our continuing mission to provide you with exceptional heart care, we have created designated Provider Care Teams.  These Care Teams include your primary Cardiologist (physician) and Advanced Practice Providers (APPs -  Physician Assistants and Nurse Practitioners) who all work together to provide you with the care you need, when you need it.  Your next appointment:   6 month(s)  The format for your next appointment:   In Person  Provider:   Cecilie Kicks, NP  Thank you for choosing [facility]!!         Signed, Candee Furbish, MD  09/04/2019 5:30 PM    [place]

## 2019-09-04 NOTE — Patient Instructions (Signed)
Medication Instructions:  The current medical regimen is effective;  continue present plan and medications.  *If you need a refill on your cardiac medications before your next appointment, please call your pharmacy*  Lab Work: Please have blood work today (Lipid)  If you have labs (blood work) drawn today and your tests are completely normal, you will receive your results only by: Marland Kitchen MyChart Message (if you have MyChart) OR . A paper copy in the mail If you have any lab test that is abnormal or we need to change your treatment, we will call you to review the results.  Follow-Up: At [facility], you and your health needs are our priority.  As part of our continuing mission to provide you with exceptional heart care, we have created designated Provider Care Teams.  These Care Teams include your primary Cardiologist (physician) and Advanced Practice Providers (APPs -  Physician Assistants and Nurse Practitioners) who all work together to provide you with the care you need, when you need it.  Your next appointment:   6 month(s)  The format for your next appointment:   In Person  Provider:   Cecilie Kicks, NP  Thank you for choosing [facility]!!

## 2019-09-05 LAB — LIPID PANEL
Chol/HDL Ratio: 3.9 ratio (ref 0.0–4.4)
Cholesterol, Total: 170 mg/dL (ref 100–199)
HDL: 44 mg/dL (ref >39)
LDL Chol Calc (NIH): 111 mg/dL — ABNORMAL HIGH (ref 0–99)
Triglycerides: 77 mg/dL (ref 0–149)
VLDL Cholesterol Cal: 15 mg/dL (ref 5–40)

## 2019-09-07 ENCOUNTER — Other Ambulatory Visit: Payer: Self-pay

## 2019-09-07 DIAGNOSIS — M5416 Radiculopathy, lumbar region: Secondary | ICD-10-CM

## 2019-09-07 NOTE — Telephone Encounter (Signed)
Referral placed,patient notified voices understanding

## 2019-09-08 ENCOUNTER — Other Ambulatory Visit: Payer: Self-pay | Admitting: Family Medicine

## 2019-09-08 DIAGNOSIS — R928 Other abnormal and inconclusive findings on diagnostic imaging of breast: Secondary | ICD-10-CM

## 2019-09-08 DIAGNOSIS — M79642 Pain in left hand: Secondary | ICD-10-CM | POA: Diagnosis not present

## 2019-09-08 DIAGNOSIS — S6992XA Unspecified injury of left wrist, hand and finger(s), initial encounter: Secondary | ICD-10-CM | POA: Diagnosis not present

## 2019-09-09 DIAGNOSIS — Z9889 Other specified postprocedural states: Secondary | ICD-10-CM | POA: Diagnosis not present

## 2019-09-09 DIAGNOSIS — Z8709 Personal history of other diseases of the respiratory system: Secondary | ICD-10-CM | POA: Diagnosis not present

## 2019-09-09 DIAGNOSIS — J343 Hypertrophy of nasal turbinates: Secondary | ICD-10-CM | POA: Diagnosis not present

## 2019-09-15 ENCOUNTER — Other Ambulatory Visit: Payer: Self-pay | Admitting: Family Medicine

## 2019-09-15 ENCOUNTER — Ambulatory Visit
Admission: RE | Admit: 2019-09-15 | Discharge: 2019-09-15 | Disposition: A | Discharge status: Home / Self Care | Payer: 59 | Source: Ambulatory Visit | Attending: Family Medicine | Admitting: Family Medicine

## 2019-09-15 ENCOUNTER — Other Ambulatory Visit: Payer: Self-pay

## 2019-09-15 DIAGNOSIS — R928 Other abnormal and inconclusive findings on diagnostic imaging of breast: Secondary | ICD-10-CM

## 2019-09-15 DIAGNOSIS — N6489 Other specified disorders of breast: Secondary | ICD-10-CM

## 2019-09-17 DIAGNOSIS — F319 Bipolar disorder, unspecified: Secondary | ICD-10-CM | POA: Diagnosis not present

## 2019-09-21 DIAGNOSIS — S6992XD Unspecified injury of left wrist, hand and finger(s), subsequent encounter: Secondary | ICD-10-CM | POA: Diagnosis not present

## 2019-09-21 DIAGNOSIS — M79645 Pain in left finger(s): Secondary | ICD-10-CM | POA: Diagnosis not present

## 2019-09-27 ENCOUNTER — Other Ambulatory Visit: Payer: Self-pay | Admitting: Family Medicine

## 2019-10-12 ENCOUNTER — Other Ambulatory Visit: Payer: Self-pay

## 2019-10-12 ENCOUNTER — Ambulatory Visit (INDEPENDENT_AMBULATORY_CARE_PROVIDER_SITE_OTHER): Payer: 59 | Admitting: Neurology

## 2019-10-12 ENCOUNTER — Encounter: Payer: Self-pay | Admitting: Neurology

## 2019-10-12 VITALS — BP 122/81 | HR 79 | Resp 18 | Ht 69.0 in | Wt 221.0 lb

## 2019-10-12 DIAGNOSIS — S39012A Strain of muscle, fascia and tendon of lower back, initial encounter: Secondary | ICD-10-CM | POA: Diagnosis not present

## 2019-10-12 DIAGNOSIS — R419 Unspecified symptoms and signs involving cognitive functions and awareness: Secondary | ICD-10-CM | POA: Diagnosis not present

## 2019-10-12 DIAGNOSIS — G44209 Tension-type headache, unspecified, not intractable: Secondary | ICD-10-CM

## 2019-10-12 MED ORDER — CYCLOBENZAPRINE HCL 5 MG PO TABS: 5.0000 mg | ORAL_TABLET | Freq: Every evening | ORAL | 3 refills | Status: DC | PRN

## 2019-10-12 NOTE — Progress Notes (Signed)
[facility] Clinic Note - Initial Visit   Date: 10/12/19  Robin [name] MRN: [id number] DOB: [birth year]   Dear Dr. Jerline Pain:  Thank you for your kind referral of Robin [name] for consultation of low back pain, headaches, and memory complaints. Although her history is well known to you, please allow [place] to reiterate it for the purpose of our medical record. The patient was accompanied to the clinic by self.    History of Present Illness: Robin [name] is a 54 y.o. right-handed female with bipolar disorder, IBS, and asthma presenting for evaluation of low back pain, headaches, and memory complaints.   She was previously living in [place] and moved to [place] a few years ago and returned back in November 2020. For the past 2-3 years, she has been having chronic low back pain which sometimes radiates down her left leg, which she describes as burning and tingling.  Pain is described as throbbing and burning.  Pain occurs a few times per month, usually lasting a few hours.  She has Aleve which does not help.  She uses ice and heat.  She has not identified any triggers. She has previously tried flexeril. MRI lumbar spine from 2019 did not show nerve impingement. There is no associated weakness.  She has previously seen orthopeadics for her low back pain and was offered injections and gabapentin.    She also complains of headaches.  She previously used to have chronic migraine, which has markedly improved.  Now, she gets migraines associated with nausea, vomiting, and photophobia once every 3 months. She was previously on imitrex.  She tends to get pressure-like headache about once per week, which can be over her sinus and at the base of head.  She does not have nausea or vomiting with these headaches.  Other times, she has pain in the neck and shoulder blades.    She also complains of problems with short-term memory.  For instance, she may have a list of items to pick up  from the grocery store, but if she does not write it down, quickly forgets it.  She has problems with concentration and endorses inadequate sleep.  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  She lives with wife and two children.  She has bipolar depression and takes lamictal 100mg , Latuda 40mg , and seroquel 200mg  daily. She has previously been evaluated at [facility] by Dr. Brett Fairy in 2018-2019 for memory complaints and narcolepsy.    Out-side paper records, electronic medical record, and images have been reviewed where available and summarized as:  MRI lumbar spine wo contrast 10/08/2017: Very mild lumbar disc and facet degeneration without stenosis.  Lab Results  Component Value Date   HGBA1C 5.2 03/18/2017   Lab Results  Component Value Date   VITAMINB12 368 06/12/2017   Lab Results  Component Value Date   TSH 1.51 03/18/2017   No results found for: ESRSEDRATE, POCTSEDRATE  Past Medical History:  Diagnosis Date  . Asthma   . Bipolar disorder ([place])   . Gestational hypertension   . HA (headache)   . History of borderline personality disorder   . Hypersomnia, persistent 04/29/2013  . IBS (irritable bowel syndrome)   . Memory loss, short term    [name] 04-03-13   . Migraine   . Seasonal allergies     Past Surgical History:  Procedure Laterality Date  . ABDOMINAL HYSTERECTOMY  00  . KNEE SURGERY Right 96,98,00,02,05,2014  . NASAL SINUS  SURGERY    . NOSE SURGERY     [id number]  . WRIST SURGERY Right [phone]     Medications:  Outpatient Encounter Medications as of 10/12/2019  Medication Sig  . EPINEPHrine 0.3 mg/0.3 mL IJ SOAJ injection Inject 0.3 mLs (0.3 mg total) into the muscle as needed for anaphylaxis. INJECT 0.3 MLS (0.3 MG TOTAL) INTO THE MUSCLE ONCE  . lamoTRIgine (LAMICTAL) 100 MG tablet TAKE 1 TABLET BY MOUTH EVERY DAY  . LATUDA 40 MG TABS tablet Take 1 tablet (40 mg total) by mouth daily with breakfast.  . meloxicam  (MOBIC) 15 MG tablet TAKE 1 TABLET(15 MG) BY MOUTH DAILY  . Omega-3 Fatty Acids (FISH OIL) 1000 MG CAPS Take 1 capsule (1,000 mg total) by mouth daily.  . ondansetron (ZOFRAN ODT) 8 MG disintegrating tablet Take 1 tablet (8 mg total) by mouth every 8 (eight) hours as needed for nausea or vomiting.  Marland Kitchen QUEtiapine (SEROQUEL XR) 200 MG 24 hr tablet Take 1 tablet (200 mg total) by mouth at bedtime.   No facility-administered encounter medications on file as of 10/12/2019.    Allergies:  Allergies  Allergen Reactions  . Aspirin Nausea And Vomiting and Rash  . Bee Venom Anaphylaxis  . Penicillins Anaphylaxis  . Bactrim [Sulfamethoxazole-Trimethoprim] Nausea And Vomiting  . Sulfa Antibiotics Nausea And Vomiting    Family History: Family History  Problem Relation Age of Onset  . Depression Mother   . High Cholesterol Mother   . Hypertension Mother   . Heart failure Mother   . Hypertension Father   . High blood pressure Maternal Grandfather   . Heart attack Maternal Grandfather   . Breast cancer Maternal Grandmother   . Diabetes Maternal Grandmother   . High blood pressure Maternal Grandmother   . Heart disease Maternal Grandmother   . Breast cancer Sister   . Schizophrenia Brother     Social History: Social History   Tobacco Use  . Smoking status: Never Smoker  . Smokeless tobacco: Never Used  Substance Use Topics  . Alcohol use: No    Comment: 2-3 drinks per year  . Drug use: No   Social History   Social History Narrative   Pneumonia vaccine 2005   Patient lives at home with wife and 2 sons.    Patient works in [place].    Patient has her AA   Right handed   One story home       Vital Signs:  BP 122/81   Pulse 79   Resp 18   Ht 5\' 9"  (1.753 m)   Wt 221 lb (100.2 kg)   SpO2 99%   BMI 32.64 kg/m    General Medical Exam:   General:  Well appearing, comfortable.   Eyes/ENT: see cranial nerve examination.   Neck:   No carotid bruits. Respiratory:  Clear  to auscultation, good air entry bilaterally.   Cardiac:  Regular rate and rhythm, no murmur.   Extremities:  No deformities, edema, or skin discoloration.  Skin:  No rashes or lesions.  Neurological Exam: MENTAL STATUS including orientation to time, place, person, recent and remote memory, attention span and concentration, language, and fund of knowledge is normal.  Speech is not dysarthric.  CRANIAL NERVES: II:  No visual field defects.   III-IV-VI: Pupils equal round.  Normal conjugate, extra-ocular eye movements in all directions of gaze.  No nystagmus.  No ptosis.   VIII:  Normal hearing and vestibular function.   XI:  Normal  shoulder shrug and head rotation.    MOTOR:  No atrophy, fasciculations or abnormal movements.  No pronator drift.   Upper Extremity:  Right  Left  Deltoid  5/5   5/5   Biceps  5/5   5/5   Triceps  5/5   5/5   Infraspinatus 5/5  5/5  Medial pectoralis 5/5  5/5  Wrist extensors  5/5   5/5   Wrist flexors  5/5   5/5   Finger extensors  5/5   5/5   Finger flexors  5/5   5/5   Dorsal interossei  5/5   5/5   Abductor pollicis  5/5   5/5   Tone (Ashworth scale)  0  0   Lower Extremity:  Right  Left  Hip flexors  5/5   5/5   Hip extensors  5/5   5/5   Adductor 5/5  5/5  Abductor 5/5  5/5  Knee flexors  5/5   5/5   Knee extensors  5/5   5/5   Dorsiflexors  5/5   5/5   Plantarflexors  5/5   5/5   Toe extensors  5/5   5/5   Toe flexors  5/5   5/5   Tone (Ashworth scale)  0  0   MSRs:  Right        Left                  brachioradialis 2+  2+  biceps 2+  2+  triceps 2+  2+  patellar 2+  2+  ankle jerk 2+  2+  Hoffman no  no  plantar response down  down   SENSORY:  Normal and symmetric perception of light touch, pinprick, and proprioception.     COORDINATION/GAIT: Normal finger-to- nose-finger and heel-to-shin.  Intact rapid alternating movements bilaterally.  Able to rise from a chair without using arms.  Gait narrow based and stable. Tandem and  stressed gait intact.    IMPRESSION: 1.  Lumbar strain, episodic 2.  Tension headaches, episodic 3.  Cognitive impairment due to combination of poor sleep, mood disorder, and medications  PLAN/RECOMMENDATIONS:  Start flexeril 5mg  at bedtime for severe low back pain/headaches Start home low back strengthening exercises For mild-moderate tension headaches, use OTC tylenol or ibuprofen Neuropsychological testing discussed as a tool to better characterize the nature of her memory complaints, and I discussed that symptoms are multifactorial stemming from combination of inadequate sleep, mood disorder, and medications.  It was mutually agreed to hold on testing for now.  If this is needed by psychiatry, we can schedule testing   Thank you for allowing me to participate in patient's care.  If I can answer any additional questions, I would be pleased to do so.    Sincerely,    Sharonica Kraszewski K. Posey Pronto, DO

## 2019-10-12 NOTE — Patient Instructions (Signed)
Start low back stretching exercises  For severe back or headache, start flexeril 5mg  at bedtime.  It may make you sleepy  For mild to moderate tension headache, ok to take tylenol and ibuprofen as needed.  Limit to twice per week  Your memory problems seem to be stemming from a combination of inadequate sleep, mood disorder, and medications.

## 2019-10-20 ENCOUNTER — Ambulatory Visit (INDEPENDENT_AMBULATORY_CARE_PROVIDER_SITE_OTHER): Payer: 59 | Admitting: Family Medicine

## 2019-10-20 ENCOUNTER — Encounter: Payer: Self-pay | Admitting: Family Medicine

## 2019-10-20 DIAGNOSIS — S6992XD Unspecified injury of left wrist, hand and finger(s), subsequent encounter: Secondary | ICD-10-CM | POA: Diagnosis not present

## 2019-10-20 DIAGNOSIS — R21 Rash and other nonspecific skin eruption: Secondary | ICD-10-CM

## 2019-10-20 MED ORDER — VALACYCLOVIR HCL 1 G PO TABS: 1000.0000 mg | ORAL_TABLET | Freq: Three times a day (TID) | ORAL | 0 refills | Status: AC

## 2019-10-20 MED ORDER — CLOBETASOL PROPIONATE 0.05 % EX CREA: 1.0000 "application " | TOPICAL_CREAM | Freq: Two times a day (BID) | CUTANEOUS | 0 refills | Status: DC

## 2019-10-20 NOTE — Progress Notes (Signed)
   Robin [name] is a 54 y.o. female who presents today for a virtual office visit.  Assessment/Plan:  New/Acute Problems: Rash History and appearance consistent with shingles.  Given that she still has new ongoing vesicular lesions popping up it is reasonable start course of Valtrex.  Will send in 1000 mg 3 times daily x7 days.  Less likely but also possible could represent contact dermatitis.  Will send in clobetasol as well.  Discussed reasons to return to care.  Follow-up as needed.    Subjective:  HPI:  Patient started having painful rash on her right knee about 3 days ago.  No obvious exposures or precipitating events.  She tried using over-the-counter cortisone cream with no improvement.  She also tried using triamcinolone with no significant improvement.  Area is very painful.  She has some itching to the area as well.  She had symptoms similar within the last couple years on the back of her neck.  No reported fevers or chills.       Objective/Observations  Physical Exam: Gen: NAD, resting comfortably Pulm: Normal work of breathing Neuro: Grossly normal, moves all extremities Psych: Normal affect and thought content        Virtual Visit via Video   I connected with Robin [name] on 10/20/19 at  4:00 PM EST by a video enabled telemedicine application and verified that I am speaking with the correct person using two identifiers. The limitations of evaluation and management by telemedicine and the availability of in person appointments were discussed. The patient expressed understanding and agreed to proceed.   Patient location: Home Provider location: [place] participating in the virtual visit: Myself and patient     Algis Greenhouse. Jerline Pain, MD 10/20/2019 10:34 AM

## 2019-10-23 ENCOUNTER — Other Ambulatory Visit: Payer: Self-pay | Admitting: Orthopedic Surgery

## 2019-10-23 DIAGNOSIS — S6990XA Unspecified injury of unspecified wrist, hand and finger(s), initial encounter: Secondary | ICD-10-CM

## 2019-10-27 ENCOUNTER — Other Ambulatory Visit: Payer: Self-pay

## 2019-10-28 ENCOUNTER — Telehealth: Payer: Self-pay | Admitting: Family Medicine

## 2019-10-28 ENCOUNTER — Ambulatory Visit (INDEPENDENT_AMBULATORY_CARE_PROVIDER_SITE_OTHER): Payer: 59 | Admitting: Family Medicine

## 2019-10-28 DIAGNOSIS — Z23 Encounter for immunization: Secondary | ICD-10-CM

## 2019-10-28 NOTE — Progress Notes (Signed)
Patient here for shingles vaccine.  This was given by CMA.  Patient tolerated well.  She was not seen or evaluated by me.  Algis Greenhouse. Jerline Pain, MD 10/28/2019 8:15 AM

## 2019-10-28 NOTE — Telephone Encounter (Signed)
Patient calling to speak to CMA she stated she got the shingles shot today and that she is does have a slight fever over 100. Patient needs a note to return to work please advise.

## 2019-10-29 ENCOUNTER — Encounter: Payer: Self-pay | Admitting: Family Medicine

## 2019-10-29 NOTE — Telephone Encounter (Signed)
Spoke with patient notified that symptoms are normal from vaccine,she can take tylenol,IBU and drink fluids. She may return to work tomorrow. Patient stated fever is back to normal

## 2019-11-03 DIAGNOSIS — M79645 Pain in left finger(s): Secondary | ICD-10-CM | POA: Diagnosis not present

## 2019-11-09 DIAGNOSIS — Z87820 Personal history of traumatic brain injury: Secondary | ICD-10-CM | POA: Diagnosis not present

## 2019-11-09 DIAGNOSIS — F319 Bipolar disorder, unspecified: Secondary | ICD-10-CM | POA: Diagnosis not present

## 2019-11-09 DIAGNOSIS — Z79899 Other long term (current) drug therapy: Secondary | ICD-10-CM | POA: Diagnosis not present

## 2019-11-09 DIAGNOSIS — R4189 Other symptoms and signs involving cognitive functions and awareness: Secondary | ICD-10-CM | POA: Diagnosis not present

## 2019-11-09 DIAGNOSIS — G43009 Migraine without aura, not intractable, without status migrainosus: Secondary | ICD-10-CM | POA: Diagnosis not present

## 2019-11-09 MED FILL — SUMATRIPTAN SUCC 100 MG TAB: 100 | 30 days supply | Qty: 10 | Fill #0

## 2019-11-12 DIAGNOSIS — F3175 Bipolar disorder, in partial remission, most recent episode depressed: Secondary | ICD-10-CM | POA: Diagnosis not present

## 2019-11-12 DIAGNOSIS — G478 Other sleep disorders: Secondary | ICD-10-CM | POA: Diagnosis not present

## 2019-11-13 DIAGNOSIS — F3175 Bipolar disorder, in partial remission, most recent episode depressed: Secondary | ICD-10-CM | POA: Diagnosis not present

## 2019-11-13 DIAGNOSIS — F319 Bipolar disorder, unspecified: Secondary | ICD-10-CM | POA: Diagnosis not present

## 2019-11-19 ENCOUNTER — Other Ambulatory Visit: Payer: 59

## 2019-11-30 DIAGNOSIS — F3175 Bipolar disorder, in partial remission, most recent episode depressed: Secondary | ICD-10-CM | POA: Diagnosis not present

## 2019-11-30 DIAGNOSIS — F319 Bipolar disorder, unspecified: Secondary | ICD-10-CM | POA: Diagnosis not present

## 2019-12-10 DIAGNOSIS — G478 Other sleep disorders: Secondary | ICD-10-CM | POA: Diagnosis not present

## 2019-12-10 DIAGNOSIS — F3175 Bipolar disorder, in partial remission, most recent episode depressed: Secondary | ICD-10-CM | POA: Diagnosis not present

## 2019-12-17 DIAGNOSIS — T7840XA Allergy, unspecified, initial encounter: Secondary | ICD-10-CM | POA: Diagnosis not present

## 2019-12-17 DIAGNOSIS — M79645 Pain in left finger(s): Secondary | ICD-10-CM | POA: Diagnosis not present

## 2019-12-22 DIAGNOSIS — F319 Bipolar disorder, unspecified: Secondary | ICD-10-CM | POA: Diagnosis not present

## 2019-12-22 DIAGNOSIS — F3175 Bipolar disorder, in partial remission, most recent episode depressed: Secondary | ICD-10-CM | POA: Diagnosis not present

## 2019-12-23 ENCOUNTER — Ambulatory Visit (INDEPENDENT_AMBULATORY_CARE_PROVIDER_SITE_OTHER): Payer: 59

## 2019-12-23 ENCOUNTER — Other Ambulatory Visit: Payer: Self-pay

## 2019-12-23 DIAGNOSIS — Z111 Encounter for screening for respiratory tuberculosis: Secondary | ICD-10-CM | POA: Diagnosis not present

## 2019-12-25 ENCOUNTER — Ambulatory Visit: Payer: 59

## 2019-12-25 DIAGNOSIS — Z111 Encounter for screening for respiratory tuberculosis: Secondary | ICD-10-CM

## 2019-12-25 LAB — TB SKIN TEST
Induration: 0 mm
TB Skin Test: NEGATIVE

## 2019-12-29 ENCOUNTER — Ambulatory Visit (INDEPENDENT_AMBULATORY_CARE_PROVIDER_SITE_OTHER): Payer: 59 | Admitting: Family Medicine

## 2019-12-29 ENCOUNTER — Encounter: Payer: Self-pay | Admitting: Family Medicine

## 2019-12-29 ENCOUNTER — Other Ambulatory Visit: Payer: Self-pay

## 2019-12-29 VITALS — BP 120/70 | HR 83 | Temp 97.5°F | Ht 69.0 in | Wt 223.4 lb

## 2019-12-29 DIAGNOSIS — F329 Major depressive disorder, single episode, unspecified: Secondary | ICD-10-CM | POA: Diagnosis not present

## 2019-12-29 DIAGNOSIS — Z13220 Encounter for screening for lipoid disorders: Secondary | ICD-10-CM | POA: Diagnosis not present

## 2019-12-29 DIAGNOSIS — Z0001 Encounter for general adult medical examination with abnormal findings: Secondary | ICD-10-CM

## 2019-12-29 DIAGNOSIS — R7989 Other specified abnormal findings of blood chemistry: Secondary | ICD-10-CM

## 2019-12-29 DIAGNOSIS — F319 Bipolar disorder, unspecified: Secondary | ICD-10-CM

## 2019-12-29 DIAGNOSIS — F32A Depression, unspecified: Secondary | ICD-10-CM

## 2019-12-29 LAB — COMPREHENSIVE METABOLIC PANEL
ALT: 15 U/L (ref 0–35)
AST: 16 U/L (ref 0–37)
Albumin: 4 g/dL (ref 3.5–5.2)
Alkaline Phosphatase: 52 U/L (ref 39–117)
BUN: 12 mg/dL (ref 6–23)
CO2: 27 mEq/L (ref 19–32)
Calcium: 9.1 mg/dL (ref 8.4–10.5)
Chloride: 110 mEq/L (ref 96–112)
Creatinine, Ser: 0.94 mg/dL (ref 0.40–1.20)
GFR: 74.93 mL/min (ref >60.00)
Glucose, Bld: 114 mg/dL — ABNORMAL HIGH (ref 70–99)
Potassium: 4 mEq/L (ref 3.5–5.1)
Sodium: 141 mEq/L (ref 135–145)
Total Bilirubin: 0.8 mg/dL (ref 0.2–1.2)
Total Protein: 6.7 g/dL (ref 6.0–8.3)

## 2019-12-29 LAB — CBC
HCT: 41.9 % (ref 36.0–46.0)
Hemoglobin: 13.5 g/dL (ref 12.0–15.0)
MCHC: 32.2 g/dL (ref 30.0–36.0)
MCV: 85.9 fl (ref 78.0–100.0)
Platelets: 242 10*3/uL (ref 150.0–400.0)
RBC: 4.88 Mil/uL (ref 3.87–5.11)
RDW: 14.1 % (ref 11.5–15.5)
WBC: 7.3 10*3/uL (ref 4.0–10.5)

## 2019-12-29 LAB — TSH: TSH: 1.26 u[IU]/mL (ref 0.35–4.50)

## 2019-12-29 LAB — LIPID PANEL
Cholesterol: 179 mg/dL (ref 0–200)
HDL: 41.1 mg/dL (ref >39.00)
LDL Cholesterol: 117 mg/dL — ABNORMAL HIGH (ref 0–99)
NonHDL: 138.04
Total CHOL/HDL Ratio: 4
Triglycerides: 105 mg/dL (ref 0.0–149.0)
VLDL: 21 mg/dL (ref 0.0–40.0)

## 2019-12-29 LAB — VITAMIN D 25 HYDROXY (VIT D DEFICIENCY, FRACTURES): VITD: 24.75 ng/mL — ABNORMAL LOW (ref 30.00–100.00)

## 2019-12-29 NOTE — Patient Instructions (Signed)
It was very nice to see you today!  We will check blood work today.  Come back in 1 year for your next physical, or sooner if needed.   Take care, Dr Brysan Mcevoy  Please try these tips to maintain a healthy lifestyle:   Eat at least 3 REAL meals and 1-2 snacks per day.  Aim for no more than 5 hours between eating.  If you eat breakfast, please do so within one hour of getting up.    Each meal should contain half fruits/vegetables, one quarter protein, and one quarter carbs (no bigger than a computer mouse)   Cut down on sweet beverages. This includes juice, soda, and sweet tea.     Drink at least 1 glass of water with each meal and aim for at least 8 glasses per day   Exercise at least 150 minutes every week.    Preventive Care 40-64 Years Old, Female Preventive care refers to visits with your health care provider and lifestyle choices that can promote health and wellness. This includes:  A yearly physical exam. This may also be called an annual well check.  Regular dental visits and eye exams.  Immunizations.  Screening for certain conditions.  Healthy lifestyle choices, such as eating a healthy diet, getting regular exercise, not using drugs or products that contain nicotine and tobacco, and limiting alcohol use. What can I expect for my preventive care visit? Physical exam Your health care provider will check your:  Height and weight. This may be used to calculate body mass index (BMI), which tells if you are at a healthy weight.  Heart rate and blood pressure.  Skin for abnormal spots. Counseling Your health care provider may ask you questions about your:  Alcohol, tobacco, and drug use.  Emotional well-being.  Home and relationship well-being.  Sexual activity.  Eating habits.  Work and work environment.  Method of birth control.  Menstrual cycle.  Pregnancy history. What immunizations do I need?  Influenza (flu) vaccine  This is recommended  every year. Tetanus, diphtheria, and pertussis (Tdap) vaccine  You may need a Td booster every 10 years. Varicella (chickenpox) vaccine  You may need this if you have not been vaccinated. Zoster (shingles) vaccine  You may need this after age 60. Measles, mumps, and rubella (MMR) vaccine  You may need at least one dose of MMR if you were born in 1957 or later. You may also need a second dose. Pneumococcal conjugate (PCV13) vaccine  You may need this if you have certain conditions and were not previously vaccinated. Pneumococcal polysaccharide (PPSV23) vaccine  You may need one or two doses if you smoke cigarettes or if you have certain conditions. Meningococcal conjugate (MenACWY) vaccine  You may need this if you have certain conditions. Hepatitis A vaccine  You may need this if you have certain conditions or if you travel or work in places where you may be exposed to hepatitis A. Hepatitis B vaccine  You may need this if you have certain conditions or if you travel or work in places where you may be exposed to hepatitis B. Haemophilus influenzae type b (Hib) vaccine  You may need this if you have certain conditions. Human papillomavirus (HPV) vaccine  If recommended by your health care provider, you may need three doses over 6 months. You may receive vaccines as individual doses or as more than one vaccine together in one shot (combination vaccines). Talk with your health care provider about the risks and   benefits of combination vaccines. What tests do I need? Blood tests  Lipid and cholesterol levels. These may be checked every 5 years, or more frequently if you are over 50 years old.  Hepatitis C test.  Hepatitis B test. Screening  Lung cancer screening. You may have this screening every year starting at age 55 if you have a 30-pack-year history of smoking and currently smoke or have quit within the past 15 years.  Colorectal cancer screening. All adults should have  this screening starting at age 50 and continuing until age 75. Your health care provider may recommend screening at age 45 if you are at increased risk. You will have tests every 1-10 years, depending on your results and the type of screening test.  Diabetes screening. This is done by checking your blood sugar (glucose) after you have not eaten for a while (fasting). You may have this done every 1-3 years.  Mammogram. This may be done every 1-2 years. Talk with your health care provider about when you should start having regular mammograms. This may depend on whether you have a family history of breast cancer.  BRCA-related cancer screening. This may be done if you have a family history of breast, ovarian, tubal, or peritoneal cancers.  Pelvic exam and Pap test. This may be done every 3 years starting at age 21. Starting at age 30, this may be done every 5 years if you have a Pap test in combination with an HPV test. Other tests  Sexually transmitted disease (STD) testing.  Bone density scan. This is done to screen for osteoporosis. You may have this scan if you are at high risk for osteoporosis. Follow these instructions at home: Eating and drinking  Eat a diet that includes fresh fruits and vegetables, whole grains, lean protein, and low-fat dairy.  Take vitamin and mineral supplements as recommended by your health care provider.  Do not drink alcohol if: ? Your health care provider tells you not to drink. ? You are pregnant, may be pregnant, or are planning to become pregnant.  If you drink alcohol: ? Limit how much you have to 0-1 drink a day. ? Be aware of how much alcohol is in your drink. In the [place] one drink equals one 12 oz bottle of beer (355 mL), one 5 oz glass of wine (148 mL), or one 1 oz glass of hard liquor (44 mL). Lifestyle  Take daily care of your teeth and gums.  Stay active. Exercise for at least 30 minutes on 5 or more days each week.  Do not use any products  that contain nicotine or tobacco, such as cigarettes, e-cigarettes, and chewing tobacco. If you need help quitting, ask your health care provider.  If you are sexually active, practice safe sex. Use a condom or other form of birth control (contraception) in order to prevent pregnancy and STIs (sexually transmitted infections).  If told by your health care provider, take low-dose aspirin daily starting at age 50. What's next?  Visit your health care provider once a year for a well check visit.  Ask your health care provider how often you should have your eyes and teeth checked.  Stay up to date on all vaccines. This information is not intended to replace advice given to you by your health care provider. Make sure you discuss any questions you have with your health care provider. Document Revised: 04/24/2018 Document Reviewed: 04/24/2018 Elsevier Patient Education  2020 Elsevier Inc.  

## 2019-12-29 NOTE — Assessment & Plan Note (Signed)
Stable.  Continue management per psychiatry. 

## 2019-12-29 NOTE — Progress Notes (Signed)
Chief Complaint:  Robin [name] is a 54 y.o. female who presents today for her annual comprehensive physical exam.    Assessment/Plan:  Chronic Problems Addressed Today: Bipolar I disorder ([place]) Stable.  Continue management per psychiatry.  Depression Stable.  Continue management per psychiatry.  Body mass index is 32.99 kg/m. / Obese BMI Metric Follow Up - 12/29/19 1025      BMI Metric Follow Up-Please document annually   BMI Metric Follow Up  Education provided       Preventative Healthcare: Up-to-date on vaccines and screenings.  No longer needs Pap smears due to hysterectomy.  Will check labs including CBC, C met, TSH, and lipid panel.  Discussed importance of calcium and vitamin D supplementation.  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  Patient Counseling(The following topics were reviewed and/or handout was given):  -Nutrition: Stressed importance of moderation in sodium/caffeine intake, saturated fat and cholesterol, caloric balance, sufficient intake of fresh fruits, vegetables, and fiber.  -Stressed the importance of regular exercise.   -Substance Abuse: Discussed cessation/primary prevention of tobacco, alcohol, or other drug use; driving or other dangerous activities under the influence; availability of treatment for abuse.   -Injury prevention: Discussed safety belts, safety helmets, smoke detector, smoking near bedding or upholstery.   -Sexuality: Discussed sexually transmitted diseases, partner selection, use of condoms, avoidance of unintended pregnancy and contraceptive alternatives.   -Dental health: Discussed importance of regular tooth brushing, flossing, and dental visits.  -Health maintenance and immunizations reviewed. Please refer to Health maintenance section.  Return to care in 1 year for next preventative visit.     Subjective:  HPI:  She has no acute complaints today.   Lifestyle Diet: Balanced. Plenty of  fruits and vegetables.  Exercise: Trying to do more walking. More yard work.   No flowsheet data found.  Health Maintenance Due  Topic Date Due  . COVID-19 Vaccine (1) Never done     ROS: Per HPI, otherwise a complete review of systems was negative.   PMH:  The following were reviewed and entered/updated in epic: Past Medical History:  Diagnosis Date  . Asthma   . Bipolar disorder ([place])   . Gestational hypertension   . HA (headache)   . History of borderline personality disorder   . Hypersomnia, persistent 04/29/2013  . IBS (irritable bowel syndrome)   . Memory loss, short term    [name] 04-03-13   . Migraine   . Seasonal allergies    Patient Active Problem List   Diagnosis Date Noted  . DDD (degenerative disc disease), lumbar 10/10/2017  . Primary narcolepsy without cataplexy 09/03/2017  . Syncope 07/08/2017  . Diverticulosis 05/01/2017  . Stomach ulcer 04/01/2017  . Narcolepsy due to underlying condition without cataplexy 07/18/2016  . Chronic pansinusitis 01/27/2016  . History of colonoscopy with polypectomy 04/14/2015  . Bipolar I disorder ([place]) 11/23/2014  . Obesity 12/23/2013  . Sleep apnea 11/25/2013  . Hypersomnia, persistent 04/29/2013  . Memory loss, short term   . Cognitive decline 04/03/2013  . Depression 04/03/2013   Past Surgical History:  Procedure Laterality Date  . ABDOMINAL HYSTERECTOMY  00  . KNEE SURGERY Right 96,98,00,02,05,2014  . NASAL SINUS SURGERY    . NOSE SURGERY     [id number]  . WRIST SURGERY Right [phone]    Family History  Problem Relation Age of Onset  . Depression Mother   . High Cholesterol Mother   . Hypertension Mother   .  Heart failure Mother   . Hypertension Father   . High blood pressure Maternal Grandfather   . Heart attack Maternal Grandfather   . Breast cancer Maternal Grandmother   . Diabetes Maternal Grandmother   . High blood pressure Maternal Grandmother   . Heart disease Maternal Grandmother   .  Breast cancer Sister   . Schizophrenia Brother     Medications- reviewed and updated Current Outpatient Medications  Medication Sig Dispense Refill  . clobetasol cream (TEMOVATE) 8.78 % Apply 1 application topically 2 (two) times daily. 30 g 0  . cyclobenzaprine (FLEXERIL) 5 MG tablet Take 1 tablet (5 mg total) by mouth at bedtime as needed (low back pain, severe headache). 30 tablet 3  . EPINEPHrine 0.3 mg/0.3 mL IJ SOAJ injection Inject 0.3 mLs (0.3 mg total) into the muscle as needed for anaphylaxis. INJECT 0.3 MLS (0.3 MG TOTAL) INTO THE MUSCLE ONCE 1 each 0  . lamoTRIgine (LAMICTAL) 100 MG tablet TAKE 1 TABLET BY MOUTH EVERY DAY 90 tablet 0  . LATUDA 40 MG TABS tablet Take 1 tablet (40 mg total) by mouth daily with breakfast. 90 tablet 0  . Omega-3 Fatty Acids (FISH OIL) 1000 MG CAPS Take 1 capsule (1,000 mg total) by mouth daily. 90 capsule 1  . ondansetron (ZOFRAN ODT) 8 MG disintegrating tablet Take 1 tablet (8 mg total) by mouth every 8 (eight) hours as needed for nausea or vomiting. 20 tablet 0  . QUEtiapine (SEROQUEL XR) 200 MG 24 hr tablet Take 1 tablet (200 mg total) by mouth at bedtime. 90 tablet 0   No current facility-administered medications for this visit.    Allergies-reviewed and updated Allergies  Allergen Reactions  . Aspirin Nausea And Vomiting and Rash  . Bee Venom Anaphylaxis  . Penicillins Anaphylaxis  . Bactrim [Sulfamethoxazole-Trimethoprim] Nausea And Vomiting  . Sulfa Antibiotics Nausea And Vomiting    Social History   Socioeconomic History  . Marital status: Married    Spouse name: [name]  . Number of children: 2  . Years of education: AA  . Highest education level: Not on file  Occupational History    Employer: [employer]  Tobacco Use  . Smoking status: Never Smoker  . Smokeless tobacco: Never Used  Substance and Sexual Activity  . Alcohol use: No    Comment: 2-3 drinks per year  . Drug use: No    . Sexual activity: Not on file  Other Topics Concern  . Not on file  Social History Narrative   Pneumonia vaccine 2005   Patient lives at home with wife and 2 sons.    Patient works in [place].    Patient has her AA   Right handed   One story home      Social Determinants of Health   Financial Resource Strain:   . Difficulty of Paying Living Expenses:   Food Insecurity:   . Worried About Charity fundraiser in the Last Year:   . Arboriculturist in the Last Year:   Transportation Needs:   . Film/video editor (Medical):   Marland Kitchen Lack of Transportation (Non-Medical):   Physical Activity:   . Days of Exercise per Week:   . Minutes of Exercise per Session:   Stress:   . Feeling of Stress :   Social Connections:   . Frequency of Communication with Friends and Family:   . Frequency of Social Gatherings with Friends and Family:   .  Attends Religious Services:   . Active Member of Clubs or Organizations:   . Attends Archivist Meetings:   Marland Kitchen Marital Status:         Objective:  Physical Exam: BP 120/70 (BP Location: Left Arm, Patient Position: Sitting, Cuff Size: Large)   Pulse 83   Temp (!) 97.5 F (36.4 C) (Temporal)   Ht _0  (1.753 m)   Wt 223 lb 6.4 oz (101.3 kg)   SpO2 98%   BMI 32.99 kg/m   Body mass index is 32.99 kg/m. Wt Readings from Last 3 Encounters:  12/29/19 223 lb 6.4 oz (101.3 kg)  10/12/19 221 lb (100.2 kg)  09/04/19 225 lb 6.4 oz (102.2 kg)   Gen: NAD, resting comfortably HEENT: TMs normal bilaterally. OP clear. No thyromegaly noted.  CV: RRR with no murmurs appreciated Pulm: NWOB, CTAB with no crackles, wheezes, or rhonchi GI: Normal bowel sounds present. Soft, Nontender, Nondistended. MSK: no edema, cyanosis, or clubbing noted Skin: warm, dry Neuro: CN2-12 grossly intact. Strength 5/5 in upper and lower extremities. Reflexes symmetric and intact bilaterally.  Psych: Normal affect and thought content     Jamesyn Moorefield M. Jerline Pain,  MD 12/29/2019 10:51 AM

## 2019-12-30 ENCOUNTER — Encounter: Payer: Self-pay | Admitting: Family Medicine

## 2019-12-30 DIAGNOSIS — R739 Hyperglycemia, unspecified: Secondary | ICD-10-CM | POA: Insufficient documentation

## 2019-12-30 DIAGNOSIS — E785 Hyperlipidemia, unspecified: Secondary | ICD-10-CM | POA: Insufficient documentation

## 2019-12-30 NOTE — Progress Notes (Signed)
Please inform patient of the following:  Cholesterol blood sugar borderline elevated.  Do not need to start meds but she should continue working on diet and exercise and we can recheck in year.Vitamin D level is low.  Recommend supplementation with 1000 to 2000 international units daily we can recheck in 6 months or so.All of her other blood work is normal.

## 2019-12-31 ENCOUNTER — Encounter: Payer: Self-pay | Admitting: Family Medicine

## 2020-01-02 DIAGNOSIS — M5441 Lumbago with sciatica, right side: Secondary | ICD-10-CM | POA: Diagnosis not present

## 2020-01-08 ENCOUNTER — Encounter: Payer: 59 | Admitting: Family Medicine

## 2020-01-26 DIAGNOSIS — F3175 Bipolar disorder, in partial remission, most recent episode depressed: Secondary | ICD-10-CM | POA: Diagnosis not present

## 2020-01-26 HISTORY — PX: COLONOSCOPY: SHX174

## 2020-01-27 ENCOUNTER — Other Ambulatory Visit: Payer: Self-pay

## 2020-01-27 DIAGNOSIS — M5441 Lumbago with sciatica, right side: Secondary | ICD-10-CM | POA: Diagnosis not present

## 2020-01-27 DIAGNOSIS — M9904 Segmental and somatic dysfunction of sacral region: Secondary | ICD-10-CM | POA: Diagnosis not present

## 2020-01-27 DIAGNOSIS — M9903 Segmental and somatic dysfunction of lumbar region: Secondary | ICD-10-CM | POA: Diagnosis not present

## 2020-01-27 DIAGNOSIS — M5136 Other intervertebral disc degeneration, lumbar region: Secondary | ICD-10-CM | POA: Diagnosis not present

## 2020-01-28 ENCOUNTER — Telehealth (INDEPENDENT_AMBULATORY_CARE_PROVIDER_SITE_OTHER): Payer: 59 | Admitting: Family Medicine

## 2020-01-28 ENCOUNTER — Encounter: Payer: Self-pay | Admitting: Family Medicine

## 2020-01-28 ENCOUNTER — Ambulatory Visit: Payer: 59

## 2020-01-28 VITALS — BP 120/70 | HR 89 | Ht 69.0 in | Wt 220.0 lb

## 2020-01-28 DIAGNOSIS — K5792 Diverticulitis of intestine, part unspecified, without perforation or abscess without bleeding: Secondary | ICD-10-CM | POA: Diagnosis not present

## 2020-01-28 DIAGNOSIS — K5790 Diverticulosis of intestine, part unspecified, without perforation or abscess without bleeding: Secondary | ICD-10-CM | POA: Diagnosis not present

## 2020-01-28 DIAGNOSIS — Z23 Encounter for immunization: Secondary | ICD-10-CM

## 2020-01-28 MED ORDER — METRONIDAZOLE 500 MG PO TABS
500.0000 mg | ORAL_TABLET | Freq: Three times a day (TID) | ORAL | 0 refills | Status: DC
Start: 2020-01-28 — End: 2020-03-15

## 2020-01-28 MED ORDER — FLUCONAZOLE 150 MG PO TABS
150.0000 mg | ORAL_TABLET | Freq: Once | ORAL | 0 refills | Status: AC
Start: 2020-01-28 — End: 2020-01-28

## 2020-01-28 MED ORDER — CIPROFLOXACIN HCL 500 MG PO TABS: 500.0000 mg | ORAL_TABLET | Freq: Two times a day (BID) | ORAL | 0 refills | Status: DC

## 2020-01-28 NOTE — Assessment & Plan Note (Signed)
Symptoms consistent with mild diverticulitis flare.  Benign abdominal exam today.  Will start Cipro and Flagyl.  Also send in pocket prescription for Diflucan in case she gets a yeast infection.  Encourage good oral hydration.  If symptoms not improving in the next several days would consider CT scan.  IBS is also on differential though much less likely given that she has not had any issues with this for the past 20 to 30 years.

## 2020-01-28 NOTE — Progress Notes (Signed)
Patient received her shingles vaccine. She tolerated the injection well.

## 2020-01-28 NOTE — Progress Notes (Signed)
   Tracy Brightwell is a 54 y.o. female who presents today for an office visit.  Assessment/Plan:  Chronic Problems Addressed Today: Diverticulosis Symptoms consistent with mild diverticulitis flare.  Benign abdominal exam today.  Will start Cipro and Flagyl.  Also send in pocket prescription for Diflucan in case she gets a yeast infection.  Encourage good oral hydration.  If symptoms not improving in the next several days would consider CT scan.  IBS is also on differential though much less likely given that she has not had any issues with this for the past 20 to 30 years.  Preventative Healthcare First Shingles vaccine given today.     Subjective:  HPI:  Patient with abdominal pain and diarrhea for the past 6 days.  Symptoms occur after eating.  Pain located in left lower quadrant.  She will have abdominal pain that subsides after having a bowel movement.  She has had several episodes of loose and watery stool.  No melena or hematochezia.  No known sick contacts.  No fevers or chills.  She has tried taking Pepto and Imodium with no significant improvement.       Objective:  Physical Exam: BP 120/70   Pulse 89   Ht 5\' 9"  (1.753 m)   Wt 220 lb (99.8 kg)   SpO2 98%   BMI 32.49 kg/m   Gen: No acute distress, resting comfortably CV: Regular rate and rhythm with no murmurs appreciated Pulm: Normal work of breathing, clear to auscultation bilaterally with no crackles, wheezes, or rhonchi GI: Abdomen soft, nontender distended.  Bowel sounds present.  Tenderness to palpation of left lower quadrant. Neuro: Grossly normal, moves all extremities Psych: Normal affect and thought content      Jing Howatt M. Jerline Pain, MD 01/28/2020 3:06 PM

## 2020-01-28 NOTE — Patient Instructions (Signed)

## 2020-02-08 ENCOUNTER — Encounter: Payer: Self-pay | Admitting: Family Medicine

## 2020-02-08 ENCOUNTER — Other Ambulatory Visit: Payer: Self-pay | Admitting: *Deleted

## 2020-02-08 ENCOUNTER — Encounter: Payer: Self-pay | Admitting: Nurse Practitioner

## 2020-02-08 DIAGNOSIS — K5790 Diverticulosis of intestine, part unspecified, without perforation or abscess without bleeding: Secondary | ICD-10-CM

## 2020-02-08 NOTE — Telephone Encounter (Signed)
Please advise 

## 2020-03-02 ENCOUNTER — Telehealth: Payer: Federal, State, Local not specified - PPO | Admitting: Family Medicine

## 2020-03-07 DIAGNOSIS — R4182 Altered mental status, unspecified: Secondary | ICD-10-CM | POA: Diagnosis not present

## 2020-03-07 DIAGNOSIS — R4189 Other symptoms and signs involving cognitive functions and awareness: Secondary | ICD-10-CM | POA: Diagnosis not present

## 2020-03-10 DIAGNOSIS — F3175 Bipolar disorder, in partial remission, most recent episode depressed: Secondary | ICD-10-CM | POA: Diagnosis not present

## 2020-03-10 DIAGNOSIS — G478 Other sleep disorders: Secondary | ICD-10-CM | POA: Diagnosis not present

## 2020-03-10 DIAGNOSIS — Z79899 Other long term (current) drug therapy: Secondary | ICD-10-CM | POA: Diagnosis not present

## 2020-03-14 ENCOUNTER — Other Ambulatory Visit: Payer: 59

## 2020-03-14 NOTE — Progress Notes (Signed)
03/14/2020 Robin [name] [id number] 12/18/1965   CHIEF COMPLAINT:  Lower abdominal pain   HISTORY OF PRESENT ILLNESS:  Robin [name] is a 54 year old female with a past medical history of asthma, depression,  bipolar disorder, migraine headaches, anal fissure and  diverticulitis. She developed lower abdominal pain with associated loose stools after eating which started mid May 2021. Her lower abdominal pain worsened and she was seen by Dr. Jerline Pain 01/28/2020. She was assessed to have a mild diverticulitis flare and she was prescribed Cipro 500mg  po bid and Flagyl 500mg  po tid for at least 7 days.  Her symptoms mostly resolved within one week. However, she has intermittent left mid to lower abdominal pain which radiates to the central lower abdomen which improves after she passes a BM. No current abdominal pain. She reported having her first episode of diverticulitis in 2018 which resolved after taking antibiotics. She increased her dietary fiber and she is passing a normal formed brown BM daily. No rectal bleeding or black stools. She underwent a colonoscopy 20 years ago in [place] due to having rectal bleeding and she was diagnosed with an anal fissure. She had an EGD done at that time and she was diagnosed with a peptic ulcer. Her most recent colonoscopy was done by [name] in 2016 which she reported was normal. I will request a copy of her colonoscopy report for further review. She denies having any heartburn, dysphagia or upper abdominal pain. She takes Advil 200mg  one or two tabs 3 to 4 times monthly for knee pain. She took Celebrex for hand pain in April for about 2 weeks then stopped it.  No family history of colorectal cancer.  No weight loss.  She has night sweats which she attributes to being perimenopausal.  No other complaints today.   CBC Latest Ref Rng & Units 12/29/2019 05/28/2017 03/18/2017  WBC 4.0 - 10.5 K/uL 7.3 9.9 7.3  Hemoglobin 12.0 - 15.0 g/dL 13.5 12.4 12.9  Hematocrit 36  - 46 % 41.9 38.5 41.1  Platelets 150 - 400 K/uL 242.0 255 265   CMP Latest Ref Rng & Units 12/29/2019 06/12/2017 05/28/2017  Glucose 70 - 99 mg/dL 114(H) 107(H) 91  BUN 6 - 23 mg/dL 12 10 15   Creatinine 0.40 - 1.20 mg/dL 0.94 1.03 1.28(H)  Sodium 135 - 145 mEq/L 141 142 140  Potassium 3.5 - 5.1 mEq/L 4.0 4.3 3.9  Chloride 96 - 112 mEq/L 110 106 108  CO2 19 - 32 mEq/L 27 25 27   Calcium 8.4 - 10.5 mg/dL 9.1 9.7 9.2  Total Protein 6.0 - 8.3 g/dL 6.7 6.7 7.0  Total Bilirubin 0.2 - 1.2 mg/dL 0.8 0.6 0.3  Alkaline Phos 39 - 117 U/L 52 51 47  AST 0 - 37 U/L 16 16 19   ALT 0 - 35 U/L 15 15 16     Past Medical History:  Diagnosis Date   Asthma    Bipolar disorder ([place])    Gestational hypertension    HA (headache)    History of borderline personality disorder    Hypersomnia, persistent 04/29/2013   IBS (irritable bowel syndrome)    Memory loss, short term    [name] 04-03-13    Migraine    Seasonal allergies    Past Surgical History:  Procedure Laterality Date   ABDOMINAL HYSTERECTOMY  00   KNEE SURGERY Right 96,98,00,02,05,2014   NASAL SINUS SURGERY     NOSE SURGERY     [phone]  WRIST SURGERY Right [phone]    Family History: Mother with HTN, headaches, depression, enlarged heart. Maternal grandmother and sister with history of breast cancer.  Father died 78 from old age. Brother joint issues and depression.   Social History: Nonsmoker. Drinks once yearly. No drug use.   Allergies  Allergen Reactions   Aspirin Nausea And Vomiting and Rash   Bee Venom Anaphylaxis   Penicillins Anaphylaxis   Bactrim [Sulfamethoxazole-Trimethoprim] Nausea And Vomiting   Sulfa Antibiotics Nausea And Vomiting      Outpatient Encounter Medications as of 03/15/2020  Medication Sig   celecoxib (CELEBREX) 200 MG capsule Take 200 mg by mouth daily.   cetirizine (ZYRTEC) 10 MG tablet Take by mouth.   Cholecalciferol 1.25 MG (50000 UT) capsule daily   ciprofloxacin  (CIPRO) 500 MG tablet Take 1 tablet (500 mg total) by mouth 2 (two) times daily.   clobetasol cream (TEMOVATE) 5.40 % Apply 1 application topically 2 (two) times daily. (Patient not taking: Reported on 01/28/2020)   cyclobenzaprine (FLEXERIL) 5 MG tablet Take 1 tablet (5 mg total) by mouth at bedtime as needed (low back pain, severe headache).   cycloSPORINE (RESTASIS) 0.05 % ophthalmic emulsion INSTILL 1 DROP IN EACH EYE TWICE DAILY   EPINEPHrine 0.3 mg/0.3 mL IJ SOAJ injection Inject 0.3 mLs (0.3 mg total) into the muscle as needed for anaphylaxis. INJECT 0.3 MLS (0.3 MG TOTAL) INTO THE MUSCLE ONCE   ibuprofen (ADVIL) 800 MG tablet Take by mouth.   lamoTRIgine (LAMICTAL) 100 MG tablet TAKE 1 TABLET BY MOUTH EVERY DAY   LATUDA 40 MG TABS tablet Take 1 tablet (40 mg total) by mouth daily with breakfast.   metroNIDAZOLE (FLAGYL) 500 MG tablet Take 1 tablet (500 mg total) by mouth 3 (three) times daily.   Omega-3 Fatty Acids (FISH OIL) 1000 MG CAPS Take 1 capsule (1,000 mg total) by mouth daily.   ondansetron (ZOFRAN ODT) 8 MG disintegrating tablet Take 1 tablet (8 mg total) by mouth every 8 (eight) hours as needed for nausea or vomiting.   QUEtiapine (SEROQUEL XR) 200 MG 24 hr tablet Take 1 tablet (200 mg total) by mouth at bedtime.   valACYclovir (VALTREX) 1000 MG tablet    No facility-administered encounter medications on file as of 03/15/2020.     REVIEW OF SYSTEMS:   Gen: + night sweats. No fever or weight loss.  CV: Denies chest pain, palpitations or edema. Resp: Denies cough, shortness of breath of hemoptysis.  GI: See HPI.  GU : Denies urinary burning, blood in urine, increased urinary frequency or incontinence. MS: Denies joint pain, muscles aches or weakness. Derm: Denies rash, itchiness, skin lesions or unhealing ulcers. Psych: + anxiety and depression  Heme: Denies bruising, bleeding. Neuro:  Denies headaches, dizziness or paresthesias. Endo:  Denies any problems with  DM, thyroid or adrenal function.    PHYSICAL EXAM: BP 124/78    Pulse 68    Ht 5\' 9"  (1.753 m)    Wt 226 lb (102.5 kg)    BMI 33.37 kg/m   General: Well developed  54 year old female in no acute distress. Head: Normocephalic and atraumatic. Eyes:  Sclerae non-icteric, conjunctive pink. Ears: Normal auditory acuity. Mouth: Dentition intact. No ulcers or lesions.  Neck: Supple, no lymphadenopathy or thyromegaly.  Lungs: Clear bilaterally to auscultation without wheezes, crackles or rhonchi. Heart: Regular rate and rhythm. No murmur, rub or gallop appreciated.  Abdomen: Soft, nontender, non distended. No masses. No hepatosplenomegaly. Normoactive bowel sounds x  4 quadrants. Umbilical scar intact.  Rectal: Deferred.  Musculoskeletal: Symmetrical with no gross deformities. Skin: Warm and dry. No rash or lesions on visible extremities. Extremities: No edema. Neurological: Alert oriented x 4, no focal deficits.  Psychological:  Alert and cooperative. Normal mood and affect.  ASSESSMENT AND PLAN:  59. 54 year old female with lower abdominal pain, presumed diverticulitis treated with Cipro and Flagyl 01/28/2020. Patient reports first episode of diverticulitis was in 2018, resolved after taking antibiotics.  -Colonoscopy 6 to 8 weeks from diverticulitis diagnosis/treatment. Colonoscopy benefits and risks discussed including risk with sedation, risk of bleeding, perforation and infection  -Patient to call our office if her abdominal pain recurs -Avoid constipation, MiraLAX as needed -Request copy of 2016 colonoscopy report from [facility] -Dicyclomine 10 mg 1 p.o. twice daily as needed  2.  Remote history of peptic ulcer disease, no current UGI symptoms        CC:  [name], MD

## 2020-03-15 ENCOUNTER — Encounter: Payer: Self-pay | Admitting: Gastroenterology

## 2020-03-15 ENCOUNTER — Ambulatory Visit: Payer: Federal, State, Local not specified - PPO

## 2020-03-15 ENCOUNTER — Ambulatory Visit: Payer: 59 | Admitting: Nurse Practitioner

## 2020-03-15 ENCOUNTER — Encounter: Payer: Self-pay | Admitting: Nurse Practitioner

## 2020-03-15 VITALS — BP 124/78 | HR 68 | Ht 69.0 in | Wt 226.0 lb

## 2020-03-15 DIAGNOSIS — R109 Unspecified abdominal pain: Secondary | ICD-10-CM | POA: Diagnosis not present

## 2020-03-15 MED ORDER — DICYCLOMINE HCL 10 MG PO CAPS
10.0000 mg | ORAL_CAPSULE | Freq: Two times a day (BID) | ORAL | 0 refills | Status: DC
Start: 2020-03-15 — End: 2021-04-06

## 2020-03-15 MED ORDER — NA SULFATE-K SULFATE-MG SULF 17.5-3.13-1.6 GM/177ML PO SOLN: 1.0000 | Freq: Once | ORAL | 0 refills | Status: AC

## 2020-03-15 NOTE — Progress Notes (Signed)
Reviewed and agree with management plan.  Meloni Hinz T. Cinda Hara, MD FACG [facility]  

## 2020-03-15 NOTE — Patient Instructions (Signed)
If you are age 55 or older, your body mass index should be between 23-30. Your Body mass index is 33.37 kg/m. If this is out of the aforementioned range listed, please consider follow up with your Primary Care Provider.  If you are age 70 or younger, your body mass index should be between 19-25. Your Body mass index is 33.37 kg/m. If this is out of the aformentioned range listed, please consider follow up with your Primary Care Provider.   We have sent the following medications to your pharmacy for you to pick up at your convenience:  Dicyclomine 10 mg 1 tablet twice a day Suprep for colonoscopy  Take Miralax 1 capful mixed in 8 ounces of water at bed time for constipation as tolerated.  Due to recent changes in healthcare laws, you may see the results of your imaging and laboratory studies on MyChart before your provider has had a chance to review them.  We understand that in some cases there may be results that are confusing or concerning to you. Not all laboratory results come back in the same time frame and the provider may be waiting for multiple results in order to interpret others.  Please give [place] 48 hours in order for your provider to thoroughly review all the results before contacting the office for clarification of your results.   Thank you for choosing [facility] Noralyn Pick, CRNP

## 2020-03-22 DIAGNOSIS — R4189 Other symptoms and signs involving cognitive functions and awareness: Secondary | ICD-10-CM | POA: Diagnosis not present

## 2020-03-23 ENCOUNTER — Encounter: Payer: Self-pay | Admitting: Gastroenterology

## 2020-03-23 ENCOUNTER — Ambulatory Visit (AMBULATORY_SURGERY_CENTER): Payer: 59 | Admitting: Gastroenterology

## 2020-03-23 ENCOUNTER — Other Ambulatory Visit: Payer: Self-pay

## 2020-03-23 VITALS — BP 142/85 | HR 74 | Temp 97.5°F | Resp 14 | Ht 69.0 in | Wt 226.0 lb

## 2020-03-23 DIAGNOSIS — K640 First degree hemorrhoids: Secondary | ICD-10-CM

## 2020-03-23 DIAGNOSIS — D123 Benign neoplasm of transverse colon: Secondary | ICD-10-CM

## 2020-03-23 DIAGNOSIS — R1030 Lower abdominal pain, unspecified: Secondary | ICD-10-CM

## 2020-03-23 DIAGNOSIS — K5732 Diverticulitis of large intestine without perforation or abscess without bleeding: Secondary | ICD-10-CM

## 2020-03-23 MED ORDER — SODIUM CHLORIDE 0.9 % IV SOLN: 500.0000 mL | Freq: Once | INTRAVENOUS | Status: DC

## 2020-03-23 NOTE — Patient Instructions (Signed)
Handouts given:  Polyps, Diverticulosis, Hemorrhoids, High fiber diet Start High fiber diet Continue present medications Await pathology results   YOU HAD AN ENDOSCOPIC PROCEDURE TODAY AT [facility]:   Refer to the procedure report that was given to you for any specific questions about what was found during the examination.  If the procedure report does not answer your questions, please call your gastroenterologist to clarify.  If you requested that your care partner not be given the details of your procedure findings, then the procedure report has been included in a sealed envelope for you to review at your convenience later.  YOU SHOULD EXPECT: Some feelings of bloating in the abdomen. Passage of more gas than usual.  Walking can help get rid of the air that was put into your GI tract during the procedure and reduce the bloating. If you had a lower endoscopy (such as a colonoscopy or flexible sigmoidoscopy) you may notice spotting of blood in your stool or on the toilet paper. If you underwent a bowel prep for your procedure, you may not have a normal bowel movement for a few days.  Please Note:  You might notice some irritation and congestion in your nose or some drainage.  This is from the oxygen used during your procedure.  There is no need for concern and it should clear up in a day or so.  SYMPTOMS TO REPORT IMMEDIATELY:   Following lower endoscopy (colonoscopy or flexible sigmoidoscopy):  Excessive amounts of blood in the stool  Significant tenderness or worsening of abdominal pains  Swelling of the abdomen that is new, acute  Fever of 100F or higher   For urgent or emergent issues, a gastroenterologist can be reached at any hour by calling [phone]. Do not use MyChart messaging for urgent concerns.    DIET:  We do recommend a small meal at first, but then you may proceed to your regular diet.  Drink plenty of fluids but you should avoid alcoholic beverages  for 24 hours.  ACTIVITY:  You should plan to take it easy for the rest of today and you should NOT DRIVE or use heavy machinery until tomorrow (because of the sedation medicines used during the test).    FOLLOW UP: Our staff will call the number listed on your records 48-72 hours following your procedure to check on you and address any questions or concerns that you may have regarding the information given to you following your procedure. If we do not reach you, we will leave a message.  We will attempt to reach you two times.  During this call, we will ask if you have developed any symptoms of COVID 19. If you develop any symptoms (ie: fever, flu-like symptoms, shortness of breath, cough etc.) before then, please call [phone].  If you test positive for Covid 19 in the 2 weeks post procedure, please call and report this information to [place].    If any biopsies were taken you will be contacted by phone or by letter within the next 1-3 weeks.  Please call us at [phone] if you have not heard about the biopsies in 3 weeks.    SIGNATURES/CONFIDENTIALITY: You and/or your care partner have signed paperwork which will be entered into your electronic medical record.  These signatures attest to the fact that that the information above on your After Visit Summary has been reviewed and is understood.  Full responsibility of the confidentiality of this discharge information lies with you  and/or your care-partner. 

## 2020-03-23 NOTE — Progress Notes (Signed)
Vitals-CW  Pt's states no medical or surgical changes since previsit or office visit. 

## 2020-03-23 NOTE — Op Note (Signed)
[place] Patient Name: Robin [name] Procedure Date: 03/23/2020 8:56 AM MRN: [id number] Endoscopist: Ladene Artist , MD Age: 54 Referring MD:  Date of Birth: 15-Dec-1965 Gender: Female Account #: [id number] Procedure:                Colonoscopy Indications:              Lower abdominal pain, Suspected diverticulitis Medicines:                Monitored Anesthesia Care Procedure:                Pre-Anesthesia Assessment:                           - Prior to the procedure, a History and Physical                            was performed, and patient medications and                            allergies were reviewed. The patient's tolerance of                            previous anesthesia was also reviewed. The risks                            and benefits of the procedure and the sedation                            options and risks were discussed with the patient.                            All questions were answered, and informed consent                            was obtained. Prior Anticoagulants: The patient has                            taken no previous anticoagulant or antiplatelet                            agents. ASA Grade Assessment: II - A patient with                            mild systemic disease. After reviewing the risks                            and benefits, the patient was deemed in                            satisfactory condition to undergo the procedure.                           After obtaining informed consent, the colonoscope  was passed under direct vision. Throughout the                            procedure, the patient's blood pressure, pulse, and                            oxygen saturations were monitored continuously. The                            Colonoscope was introduced through the anus and                            advanced to the the cecum, identified by                            appendiceal orifice  and ileocecal valve. The                            ileocecal valve, appendiceal orifice, and rectum                            were photographed. The quality of the bowel                            preparation was good. The colonoscopy was performed                            without difficulty. The patient tolerated the                            procedure well. Scope In: 9:06:29 AM Scope Out: 9:22:52 AM Scope Withdrawal Time: 0 hours 12 minutes 49 seconds  Total Procedure Duration: 0 hours 16 minutes 23 seconds  Findings:                 The perianal and digital rectal examinations were                            normal.                           A 5 mm polyp was found in the transverse colon. The                            polyp was sessile. The polyp was removed with a                            cold snare. Resection and retrieval were complete.                           A few small-mouthed diverticula were found in the                            right colon. There was no evidence of diverticular  bleeding.                           Multiple small-mouthed diverticula were found in                            the left colon. There was no evidence of                            diverticular bleeding.                           Internal hemorrhoids were found during                            retroflexion. The hemorrhoids were small and Grade                            I (internal hemorrhoids that do not prolapse).                           The exam was otherwise without abnormality on                            direct and retroflexion views. Complications:            No immediate complications. Estimated blood loss:                            None. Estimated Blood Loss:     Estimated blood loss: none. Impression:               - One 5 mm polyp in the transverse colon, removed                            with a cold snare. Resected and retrieved.                            - Mild diverticulosis in the right colon.                           - Mild diverticulosis in the left colon.                           - Internal hemorrhoids.                           - The examination was otherwise normal on direct                            and retroflexion views. Recommendation:           - Repeat colonoscopy date to be determined after                            pending pathology results are reviewed for  surveillance based on pathology results.                           - Patient has a contact number available for                            emergencies. The signs and symptoms of potential                            delayed complications were discussed with the                            patient. Return to normal activities tomorrow.                            Written discharge instructions were provided to the                            patient.                           - High fiber diet.                           - Continue present medications.                           - Await pathology results. Ladene Artist, MD 03/23/2020 9:26:39 AM This report has been signed electronically.

## 2020-03-23 NOTE — Progress Notes (Signed)
A/ox3, pleased with MAC, report to RN 

## 2020-03-23 NOTE — Progress Notes (Signed)
Called to room to assist during endoscopic procedure.  Patient ID and intended procedure confirmed with present staff. Received instructions for my participation in the procedure from the performing physician.  

## 2020-03-25 ENCOUNTER — Telehealth: Payer: Self-pay

## 2020-03-25 NOTE — Telephone Encounter (Signed)
  Follow up Call-  Call back number 03/23/2020  Post procedure Call Back phone  # [phone]  Permission to leave phone message Yes  Some recent data might be hidden     Patient questions:  Do you have a fever, pain , or abdominal swelling? No. Pain Score  0 *  Have you tolerated food without any problems? Yes.    Have you been able to return to your normal activities? Yes.    Do you have any questions about your discharge instructions: Diet   No. Medications  No. Follow up visit  No.  Do you have questions or concerns about your Care? No.  Actions: * If pain score is 4 or above: No action needed, pain <4.  1. Have you developed a fever since your procedure? no  2.   Have you had an respiratory symptoms (SOB or cough) since your procedure? no  3.   Have you tested positive for COVID 19 since your procedure no  4.   Have you had any family members/close contacts diagnosed with the COVID 19 since your procedure?  no   If yes to any of these questions please route to Joylene John, RN and Erenest Rasher, RN

## 2020-03-30 ENCOUNTER — Encounter: Payer: Self-pay | Admitting: Physician Assistant

## 2020-03-30 ENCOUNTER — Telehealth (INDEPENDENT_AMBULATORY_CARE_PROVIDER_SITE_OTHER): Payer: 59 | Admitting: Physician Assistant

## 2020-03-30 VITALS — Ht 69.0 in | Wt 225.0 lb

## 2020-03-30 DIAGNOSIS — M5136 Other intervertebral disc degeneration, lumbar region: Secondary | ICD-10-CM

## 2020-03-30 MED ORDER — PREDNISONE 20 MG PO TABS: 40.0000 mg | ORAL_TABLET | Freq: Every day | ORAL | 0 refills | Status: DC

## 2020-03-30 NOTE — Progress Notes (Signed)
Virtual Visit via Video   I connected with Robin [name] on 03/30/20 at  7:30 AM EDT by a video enabled telemedicine application and verified that I am speaking with the correct person using two identifiers. Location patient: Home Location provider: [facility], Office Persons participating in the virtual visit: Robin [name], [place] PA-C, Robin Pickler, LPN   I discussed the limitations of evaluation and management by telemedicine and the availability of in person appointments. The patient expressed understanding and agreed to proceed.  I acted as a Education administrator for [employer], PA-C [employer], LPN   Subjective:   HPI:   Back pain Pt c/o low to mid back pain, radiates into hips at time for couple weeks. Pt has been doing heat/ice to area, muscle creams, muscle relaxer, Aleve and Advil with little relief.  Denies: bowel/bladder dysfunction, dysuria, hematuria, numbness/tingling to lower extremities  She has tolerated: mobic, celebrex, flexeril and prednisone in the past  ROS: See pertinent positives and negatives per HPI.  Patient Active Problem List   Diagnosis Date Noted  . Hyperglycemia 12/30/2019  . Dyslipidemia 12/30/2019  . DDD (degenerative disc disease), lumbar 10/10/2017  . Primary narcolepsy without cataplexy 09/03/2017  . Syncope 07/08/2017  . Diverticulosis 05/01/2017  . Stomach ulcer 04/01/2017  . Narcolepsy due to underlying condition without cataplexy 07/18/2016  . Chronic pansinusitis 01/27/2016  . History of colonoscopy with polypectomy 04/14/2015  . Bipolar I disorder ([place]) 11/23/2014  . Obesity 12/23/2013  . Sleep apnea 11/25/2013  . Hypersomnia, persistent 04/29/2013  . Memory loss, short term   . Cognitive decline 04/03/2013  . Depression 04/03/2013    Social History   Tobacco Use  . Smoking status: Never Smoker  . Smokeless tobacco: Never Used  Substance Use Topics  . Alcohol use: No    Comment: 2-3 drinks per year     Current Outpatient Medications:  .  cetirizine (ZYRTEC) 10 MG tablet, Take by mouth., Disp: , Rfl:  .  Cholecalciferol 1.25 MG (50000 UT) capsule, daily, Disp: , Rfl:  .  clobetasol cream (TEMOVATE) 9.14 %, Apply 1 application topically 2 (two) times daily., Disp: 30 g, Rfl: 0 .  cyclobenzaprine (FLEXERIL) 5 MG tablet, Take 1 tablet (5 mg total) by mouth at bedtime as needed (low back pain, severe headache)., Disp: 30 tablet, Rfl: 3 .  cycloSPORINE (RESTASIS) 0.05 % ophthalmic emulsion, INSTILL 1 DROP IN [facility] EYE TWICE DAILY, Disp: , Rfl:  .  dicyclomine (BENTYL) 10 MG capsule, Take 1 capsule (10 mg total) by mouth in the morning and at bedtime., Disp: 60 capsule, Rfl: 0 .  EPINEPHrine 0.3 mg/0.3 mL IJ SOAJ injection, Inject 0.3 mLs (0.3 mg total) into the muscle as needed for anaphylaxis. INJECT 0.3 MLS (0.3 MG TOTAL) INTO THE MUSCLE ONCE, Disp: 1 each, Rfl: 0 .  ibuprofen (ADVIL) 800 MG tablet, Take by mouth., Disp: , Rfl:  .  lamoTRIgine (LAMICTAL) 100 MG tablet, TAKE 1 TABLET BY MOUTH EVERY DAY, Disp: 90 tablet, Rfl: 0 .  LATUDA 40 MG TABS tablet, Take 1 tablet (40 mg total) by mouth daily with breakfast., Disp: 90 tablet, Rfl: 0 .  ondansetron (ZOFRAN ODT) 8 MG disintegrating tablet, Take 1 tablet (8 mg total) by mouth every 8 (eight) hours as needed for nausea or vomiting., Disp: 20 tablet, Rfl: 0 .  QUEtiapine (SEROQUEL XR) 200 MG 24 hr tablet, Take 1 tablet (200 mg total) by mouth at bedtime., Disp: 90 tablet, Rfl: 0 .  valACYclovir (  VALTREX) 1000 MG tablet, Take 1,000 mg by mouth as needed. , Disp: , Rfl:  .  predniSONE (DELTASONE) 20 MG tablet, Take 2 tablets (40 mg total) by mouth daily., Disp: 10 tablet, Rfl: 0  Allergies  Allergen Reactions  . Aspirin Nausea And Vomiting and Rash  . Bee Venom Anaphylaxis  . Penicillins Anaphylaxis  . Bactrim [Sulfamethoxazole-Trimethoprim] Nausea And Vomiting  . Sulfa Antibiotics Nausea And Vomiting    Objective:   VITALS: Per patient if  applicable, see vitals. GENERAL: Alert, appears well and in no acute distress. HEENT: Atraumatic, conjunctiva clear, no obvious abnormalities on inspection of external nose and ears. NECK: Normal movements of the head and neck. CARDIOPULMONARY: No increased WOB. Speaking in clear sentences. I:E ratio WNL.  MS: Moves all visible extremities without noticeable abnormality. PSYCH: Pleasant and cooperative, well-groomed. Speech normal rate and rhythm. Affect is appropriate. Insight and judgement are appropriate. Attention is focused, linear, and appropriate.  NEURO: CN grossly intact. Oriented as arrived to appointment on time with no prompting. Moves both UE equally.  SKIN: No obvious lesions, wounds, erythema, or cyanosis noted on face or hands.  Assessment and Plan:   [name] was seen today for back pain.  Diagnoses and all orders for this visit:  DDD (degenerative disc disease), lumbar  Other orders -     predniSONE (DELTASONE) 20 MG tablet; Take 2 tablets (40 mg total) by mouth daily.   No red flags on discussion. She does have hx of bipolar disorder that is currently well controlled -- we discussed risks/benefits of oral prednisone and she is agreeable to start this. Low threshold to stop this medication if it causes mania or other concerns. Follow-up in office if symptoms worsen or do not improve.  . Reviewed expectations re: course of current medical issues. . Discussed self-management of symptoms. . Outlined signs and symptoms indicating need for more acute intervention. . Patient verbalized understanding and all questions were answered. Marland Kitchen Health Maintenance issues including appropriate healthy diet, exercise, and smoking avoidance were discussed with patient. . See orders for this visit as documented in the electronic medical record.  I discussed the assessment and treatment plan with the patient. The patient was provided an opportunity to ask questions and all were answered.  The patient agreed with the plan and demonstrated an understanding of the instructions.   The patient was advised to call back or seek an in-person evaluation if the symptoms worsen or if the condition fails to improve as anticipated.   CMA or LPN served as scribe during this visit. History, Physical, and Plan performed by medical provider. The above documentation has been reviewed and is accurate and complete.   [place], [place] 03/30/2020

## 2020-03-31 ENCOUNTER — Ambulatory Visit
Admission: RE | Admit: 2020-03-31 | Discharge: 2020-03-31 | Disposition: A | Discharge status: Home / Self Care | Payer: 59 | Source: Ambulatory Visit | Attending: Family Medicine | Admitting: Family Medicine

## 2020-03-31 ENCOUNTER — Other Ambulatory Visit: Payer: Self-pay | Admitting: Family Medicine

## 2020-03-31 ENCOUNTER — Other Ambulatory Visit: Payer: Self-pay

## 2020-03-31 DIAGNOSIS — N6489 Other specified disorders of breast: Secondary | ICD-10-CM

## 2020-04-03 ENCOUNTER — Encounter: Payer: Self-pay | Admitting: Gastroenterology

## 2020-04-07 ENCOUNTER — Telehealth: Payer: Self-pay

## 2020-04-07 NOTE — Telephone Encounter (Signed)
Pt notified that handicap placard form has been signed, completed, and ready to be picked up at the front desk.

## 2020-06-13 ENCOUNTER — Encounter: Payer: Self-pay | Admitting: Family Medicine

## 2020-06-13 ENCOUNTER — Telehealth (INDEPENDENT_AMBULATORY_CARE_PROVIDER_SITE_OTHER): Payer: 59 | Admitting: Family Medicine

## 2020-06-13 DIAGNOSIS — G43809 Other migraine, not intractable, without status migrainosus: Secondary | ICD-10-CM | POA: Diagnosis not present

## 2020-06-13 MED ORDER — PROCHLORPERAZINE MALEATE 10 MG PO TABS
10.0000 mg | ORAL_TABLET | Freq: Three times a day (TID) | ORAL | 0 refills | Status: DC | PRN
Start: 2020-06-13 — End: 2020-06-20

## 2020-06-13 MED ORDER — DICLOFENAC SODIUM 50 MG PO TBEC: 50.0000 mg | DELAYED_RELEASE_TABLET | Freq: Three times a day (TID) | ORAL | 0 refills | Status: DC | PRN

## 2020-06-13 NOTE — Assessment & Plan Note (Signed)
Acute flare.  No red flags.  Will start migraine cocktail with diphenhydramine, diclofenac, and Compazine.  Discussed potential side effects.  Discussed reasons to return to care.  If no improvement would consider trial of Toradol injection.

## 2020-06-13 NOTE — Progress Notes (Signed)
   Robin [name] is a 54 y.o. female who presents today for a virtual office visit.  Assessment/Plan:  Chronic Problems Addressed Today: Migraine Acute flare.  No red flags.  Will start migraine cocktail with diphenhydramine, diclofenac, and Compazine.  Discussed potential side effects.  Discussed reasons to return to care.  If no improvement would consider trial of Toradol injection.    Subjective:  HPI:  Patient with headache for the last couple of weeks. Was originally ocurring in the afternoon but is now constant.  Located in the front of her head.  Feels like a throbbing sensation.  Has a history of migraines.  No reported nausea or vomiting.  No reported weakness or numbness.  Tried over-the-counter meds with no significant improvement.  She has tried Imitrex in the past with mild improvement.       Objective/Observations  Physical Exam: Gen: NAD, resting comfortably Pulm: Normal work of breathing Neuro: Grossly normal, moves all extremities Psych: Normal affect and thought content  Virtual Visit via Video   I connected with Robin [name] on 06/13/20 at  4:00 PM EDT by a video enabled telemedicine application and verified that I am speaking with the correct person using two identifiers. The limitations of evaluation and management by telemedicine and the availability of in person appointments were discussed. The patient expressed understanding and agreed to proceed.   Patient location: Home Provider location: [place] participating in the virtual visit: Myself and Patient     Algis Greenhouse. Jerline Pain, MD 06/13/2020 4:15 PM

## 2020-06-20 ENCOUNTER — Other Ambulatory Visit: Payer: Self-pay | Admitting: Family Medicine

## 2020-06-23 ENCOUNTER — Other Ambulatory Visit: Payer: Self-pay

## 2020-06-23 ENCOUNTER — Ambulatory Visit (INDEPENDENT_AMBULATORY_CARE_PROVIDER_SITE_OTHER): Payer: 59

## 2020-06-23 ENCOUNTER — Encounter: Payer: Self-pay | Admitting: Family Medicine

## 2020-06-23 DIAGNOSIS — Z23 Encounter for immunization: Secondary | ICD-10-CM

## 2020-06-27 ENCOUNTER — Other Ambulatory Visit: Payer: Self-pay | Admitting: Family Medicine

## 2020-07-04 ENCOUNTER — Encounter: Payer: Self-pay | Admitting: Family Medicine

## 2020-07-05 ENCOUNTER — Telehealth (INDEPENDENT_AMBULATORY_CARE_PROVIDER_SITE_OTHER): Payer: 59 | Admitting: Family Medicine

## 2020-07-05 DIAGNOSIS — G43809 Other migraine, not intractable, without status migrainosus: Secondary | ICD-10-CM

## 2020-07-05 DIAGNOSIS — J324 Chronic pansinusitis: Secondary | ICD-10-CM

## 2020-07-05 MED ORDER — AZELASTINE HCL 0.1 % NA SOLN
2.0000 | Freq: Two times a day (BID) | NASAL | 12 refills | Status: DC
Start: 2020-07-05 — End: 2021-04-05

## 2020-07-05 MED ORDER — PREDNISONE 50 MG PO TABS
ORAL_TABLET | ORAL | 0 refills | Status: DC
Start: 2020-07-05 — End: 2020-07-25

## 2020-07-05 MED ORDER — AZITHROMYCIN 250 MG PO TABS: ORAL_TABLET | ORAL | 0 refills | Status: DC

## 2020-07-05 NOTE — Assessment & Plan Note (Signed)
Seen about 3 weeks ago for this.  She did very well with migraine cocktail.  If has frequent headaches would consider trial of preventive medication however for now we will continue with abortive therapy as needed.

## 2020-07-05 NOTE — Assessment & Plan Note (Signed)
Recent flare.  Patient thinks it is due to weather change.  No red flags.  Will start azithromycin and prednisone.  Also start Astelin.  Discussed reasons to return to care.

## 2020-07-05 NOTE — Progress Notes (Signed)
   Shamiracle Gorden is a 54 y.o. female who presents today for a virtual office visit.  Assessment/Plan:  Chronic Problems Addressed Today: Chronic pansinusitis Recent flare.  Patient thinks it is due to weather change.  No red flags.  Will start azithromycin and prednisone.  Also start Astelin.  Discussed reasons to return to care.  Migraine Seen about 3 weeks ago for this.  She did very well with migraine cocktail.  If has frequent headaches would consider trial of preventive medication however for now we will continue with abortive therapy as needed.     Subjective:  HPI:  Patient with flare up of sinusitis for the last 5 days. Has been using nasal rinse with no improvement.  No fevers or chills.  Occasionally bloody discharge however discharge predominantly thick green and yellow.  Lots of facial pressure.       Objective/Observations  Physical Exam: Gen: NAD, resting comfortably Pulm: Normal work of breathing Neuro: Grossly normal, moves all extremities Psych: Normal affect and thought content  Virtual Visit via Video   I connected with Natsuko Kelsay on 07/05/20 at  8:40 AM EST by a video enabled telemedicine application and verified that I am speaking with the correct person using two identifiers. The limitations of evaluation and management by telemedicine and the availability of in person appointments were discussed. The patient expressed understanding and agreed to proceed.   Patient location: Home Provider location: [place] participating in the virtual visit: Myself and Patient     Algis Greenhouse. Jerline Pain, MD 07/05/2020 8:54 AM

## 2020-07-20 ENCOUNTER — Encounter: Payer: Self-pay | Admitting: Family Medicine

## 2020-07-25 ENCOUNTER — Other Ambulatory Visit: Payer: Self-pay | Admitting: *Deleted

## 2020-07-25 MED ORDER — DOXYCYCLINE HYCLATE 100 MG PO TABS
100.0000 mg | ORAL_TABLET | Freq: Two times a day (BID) | ORAL | 0 refills | Status: AC
Start: 2020-07-25 — End: 2020-08-01

## 2020-07-25 MED ORDER — PREDNISONE 50 MG PO TABS: ORAL_TABLET | ORAL | 0 refills | Status: DC

## 2020-08-28 ENCOUNTER — Other Ambulatory Visit: Payer: Self-pay | Admitting: Neurology

## 2020-12-14 ENCOUNTER — Other Ambulatory Visit: Payer: Self-pay

## 2020-12-14 ENCOUNTER — Telehealth: Payer: Self-pay

## 2020-12-14 DIAGNOSIS — T782XXD Anaphylactic shock, unspecified, subsequent encounter: Secondary | ICD-10-CM

## 2020-12-14 MED ORDER — EPINEPHRINE 0.3 MG/0.3ML IJ SOAJ: 0.3000 mg | INTRAMUSCULAR | 0 refills | Status: DC | PRN

## 2020-12-14 NOTE — Telephone Encounter (Signed)
Pt called asking if Dr. Jerline Pain could refill her epipen prescription. Pt states hers expired in February of 2022. Pt also wants her pharmacy changed to [facility] [address] in [place]. Please advise.

## 2021-01-11 ENCOUNTER — Emergency Department (HOSPITAL_BASED_OUTPATIENT_CLINIC_OR_DEPARTMENT_OTHER): Payer: 59

## 2021-01-11 ENCOUNTER — Inpatient Hospital Stay (HOSPITAL_BASED_OUTPATIENT_CLINIC_OR_DEPARTMENT_OTHER)
Admission: EM | Admit: 2021-01-11 | Discharge: 2021-01-14 | DRG: 441 | Disposition: A | Discharge status: Home / Self Care | Payer: 59 | Attending: Family Medicine | Admitting: Family Medicine

## 2021-01-11 ENCOUNTER — Encounter (HOSPITAL_BASED_OUTPATIENT_CLINIC_OR_DEPARTMENT_OTHER): Payer: Self-pay

## 2021-01-11 ENCOUNTER — Other Ambulatory Visit: Payer: Self-pay

## 2021-01-11 DIAGNOSIS — E876 Hypokalemia: Secondary | ICD-10-CM | POA: Diagnosis present

## 2021-01-11 DIAGNOSIS — Z79899 Other long term (current) drug therapy: Secondary | ICD-10-CM | POA: Diagnosis not present

## 2021-01-11 DIAGNOSIS — K589 Irritable bowel syndrome without diarrhea: Secondary | ICD-10-CM | POA: Diagnosis present

## 2021-01-11 DIAGNOSIS — Z7952 Long term (current) use of systemic steroids: Secondary | ICD-10-CM

## 2021-01-11 DIAGNOSIS — R7989 Other specified abnormal findings of blood chemistry: Secondary | ICD-10-CM | POA: Diagnosis present

## 2021-01-11 DIAGNOSIS — Z91030 Bee allergy status: Secondary | ICD-10-CM

## 2021-01-11 DIAGNOSIS — Z882 Allergy status to sulfonamides status: Secondary | ICD-10-CM | POA: Diagnosis not present

## 2021-01-11 DIAGNOSIS — J45909 Unspecified asthma, uncomplicated: Secondary | ICD-10-CM | POA: Diagnosis present

## 2021-01-11 DIAGNOSIS — K7689 Other specified diseases of liver: Principal | ICD-10-CM | POA: Diagnosis present

## 2021-01-11 DIAGNOSIS — N179 Acute kidney failure, unspecified: Secondary | ICD-10-CM | POA: Diagnosis present

## 2021-01-11 DIAGNOSIS — Z8249 Family history of ischemic heart disease and other diseases of the circulatory system: Secondary | ICD-10-CM | POA: Diagnosis not present

## 2021-01-11 DIAGNOSIS — F32A Depression, unspecified: Secondary | ICD-10-CM | POA: Diagnosis present

## 2021-01-11 DIAGNOSIS — K2100 Gastro-esophageal reflux disease with esophagitis, without bleeding: Secondary | ICD-10-CM | POA: Diagnosis present

## 2021-01-11 DIAGNOSIS — Z880 Allergy status to penicillin: Secondary | ICD-10-CM | POA: Diagnosis not present

## 2021-01-11 DIAGNOSIS — Z20822 Contact with and (suspected) exposure to covid-19: Secondary | ICD-10-CM | POA: Diagnosis present

## 2021-01-11 DIAGNOSIS — D1803 Hemangioma of intra-abdominal structures: Secondary | ICD-10-CM | POA: Diagnosis present

## 2021-01-11 DIAGNOSIS — F603 Borderline personality disorder: Secondary | ICD-10-CM | POA: Diagnosis present

## 2021-01-11 DIAGNOSIS — F419 Anxiety disorder, unspecified: Secondary | ICD-10-CM | POA: Diagnosis present

## 2021-01-11 DIAGNOSIS — G4733 Obstructive sleep apnea (adult) (pediatric): Secondary | ICD-10-CM | POA: Diagnosis present

## 2021-01-11 DIAGNOSIS — Z83438 Family history of other disorder of lipoprotein metabolism and other lipidemia: Secondary | ICD-10-CM

## 2021-01-11 DIAGNOSIS — M940 Chondrocostal junction syndrome [Tietze]: Secondary | ICD-10-CM | POA: Diagnosis present

## 2021-01-11 DIAGNOSIS — R17 Unspecified jaundice: Secondary | ICD-10-CM | POA: Diagnosis not present

## 2021-01-11 DIAGNOSIS — K831 Obstruction of bile duct: Secondary | ICD-10-CM | POA: Diagnosis present

## 2021-01-11 DIAGNOSIS — K769 Liver disease, unspecified: Secondary | ICD-10-CM | POA: Diagnosis not present

## 2021-01-11 DIAGNOSIS — Z833 Family history of diabetes mellitus: Secondary | ICD-10-CM

## 2021-01-11 DIAGNOSIS — E860 Dehydration: Secondary | ICD-10-CM | POA: Diagnosis present

## 2021-01-11 DIAGNOSIS — F319 Bipolar disorder, unspecified: Secondary | ICD-10-CM | POA: Diagnosis present

## 2021-01-11 DIAGNOSIS — J302 Other seasonal allergic rhinitis: Secondary | ICD-10-CM | POA: Diagnosis present

## 2021-01-11 HISTORY — DX: Gastro-esophageal reflux disease with esophagitis, without bleeding: K21.00

## 2021-01-11 LAB — COMPREHENSIVE METABOLIC PANEL
ALT: 505 U/L — ABNORMAL HIGH (ref 0–44)
AST: 224 U/L — ABNORMAL HIGH (ref 15–41)
Albumin: 4.2 g/dL (ref 3.5–5.0)
Alkaline Phosphatase: 97 U/L (ref 38–126)
Anion gap: 10 (ref 5–15)
BUN: 10 mg/dL (ref 6–20)
CO2: 26 mmol/L (ref 22–32)
Calcium: 9.6 mg/dL (ref 8.9–10.3)
Chloride: 103 mmol/L (ref 98–111)
Creatinine, Ser: 1.11 mg/dL — ABNORMAL HIGH (ref 0.44–1.00)
GFR, Estimated: 59 mL/min — ABNORMAL LOW (ref >60)
Glucose, Bld: 105 mg/dL — ABNORMAL HIGH (ref 70–99)
Potassium: 3.4 mmol/L — ABNORMAL LOW (ref 3.5–5.1)
Sodium: 139 mmol/L (ref 135–145)
Total Bilirubin: 6.7 mg/dL — ABNORMAL HIGH (ref 0.3–1.2)
Total Protein: 6.9 g/dL (ref 6.5–8.1)

## 2021-01-11 LAB — CBC WITH DIFFERENTIAL/PLATELET
Abs Immature Granulocytes: 0.01 10*3/uL (ref 0.00–0.07)
Basophils Absolute: 0.1 10*3/uL (ref 0.0–0.1)
Basophils Relative: 1 %
Eosinophils Absolute: 0 10*3/uL (ref 0.0–0.5)
Eosinophils Relative: 1 %
HCT: 43 % (ref 36.0–46.0)
Hemoglobin: 13.6 g/dL (ref 12.0–15.0)
Immature Granulocytes: 0 %
Lymphocytes Relative: 26 %
Lymphs Abs: 1.8 10*3/uL (ref 0.7–4.0)
MCH: 27.8 pg (ref 26.0–34.0)
MCHC: 31.6 g/dL (ref 30.0–36.0)
MCV: 87.8 fL (ref 80.0–100.0)
Monocytes Absolute: 0.5 10*3/uL (ref 0.1–1.0)
Monocytes Relative: 8 %
Neutro Abs: 4.5 10*3/uL (ref 1.7–7.7)
Neutrophils Relative %: 64 %
Platelets: 217 10*3/uL (ref 150–400)
RBC: 4.9 MIL/uL (ref 3.87–5.11)
RDW: 13.9 % (ref 11.5–15.5)
WBC: 6.9 10*3/uL (ref 4.0–10.5)
nRBC: 0 % (ref 0.0–0.2)

## 2021-01-11 LAB — URINALYSIS, ROUTINE W REFLEX MICROSCOPIC
Glucose, UA: NEGATIVE mg/dL
Hgb urine dipstick: NEGATIVE
Ketones, ur: NEGATIVE mg/dL
Leukocytes,Ua: NEGATIVE
Nitrite: NEGATIVE
Protein, ur: NEGATIVE mg/dL
Specific Gravity, Urine: 1.016 (ref 1.005–1.030)
pH: 5.5 (ref 5.0–8.0)

## 2021-01-11 LAB — PROTIME-INR
INR: 1 (ref 0.8–1.2)
Prothrombin Time: 12.9 seconds (ref 11.4–15.2)

## 2021-01-11 LAB — RESP PANEL BY RT-PCR (FLU A&B, COVID) ARPGX2
Influenza A by PCR: NEGATIVE
Influenza B by PCR: NEGATIVE
SARS Coronavirus 2 by RT PCR: NEGATIVE

## 2021-01-11 LAB — TROPONIN I (HIGH SENSITIVITY)
Troponin I (High Sensitivity): 4 ng/L (ref <18)
Troponin I (High Sensitivity): 4 ng/L (ref <18)

## 2021-01-11 LAB — MAGNESIUM: Magnesium: 1.9 mg/dL (ref 1.7–2.4)

## 2021-01-11 LAB — LIPASE, BLOOD: Lipase: 16 U/L (ref 11–51)

## 2021-01-11 MED ORDER — MORPHINE SULFATE (PF) 2 MG/ML IV SOLN
1.0000 mg | Freq: Four times a day (QID) | INTRAVENOUS | Status: DC | PRN
  Administered 2021-01-11 – 2021-01-14 (×9): 1 mg via INTRAVENOUS
  Filled 2021-01-11 (×9): qty 1

## 2021-01-11 MED ORDER — AZELASTINE HCL 0.1 % NA SOLN: 2.0000 | Freq: Two times a day (BID) | NASAL | Status: DC | PRN

## 2021-01-11 MED ORDER — FLUTICASONE PROPIONATE 50 MCG/ACT NA SUSP
1.0000 | Freq: Every day | NASAL | Status: DC
  Administered 2021-01-12 – 2021-01-14 (×3): 1 via NASAL
  Filled 2021-01-11: qty 16

## 2021-01-11 MED ORDER — SENNOSIDES-DOCUSATE SODIUM 8.6-50 MG PO TABS: 1.0000 | ORAL_TABLET | Freq: Every evening | ORAL | Status: DC | PRN

## 2021-01-11 MED ORDER — BISACODYL 5 MG PO TBEC: 5.0000 mg | DELAYED_RELEASE_TABLET | Freq: Every day | ORAL | Status: DC | PRN

## 2021-01-11 MED ORDER — LAMOTRIGINE 100 MG PO TABS
100.0000 mg | ORAL_TABLET | Freq: Every day | ORAL | Status: DC
  Filled 2021-01-11 (×2): qty 1

## 2021-01-11 MED ORDER — BUDESONIDE 0.5 MG/2ML IN SUSP: 0.5000 mg | Freq: Every day | RESPIRATORY_TRACT | Status: DC | PRN

## 2021-01-11 MED ORDER — PANTOPRAZOLE SODIUM 40 MG IV SOLR
40.0000 mg | Freq: Once | INTRAVENOUS | Status: AC
  Administered 2021-01-11: 40 mg via INTRAVENOUS
  Filled 2021-01-11: qty 40

## 2021-01-11 MED ORDER — TRAZODONE HCL 50 MG PO TABS
25.0000 mg | ORAL_TABLET | Freq: Every evening | ORAL | Status: DC | PRN
  Administered 2021-01-11 – 2021-01-14 (×2): 25 mg via ORAL
  Filled 2021-01-11 (×2): qty 1

## 2021-01-11 MED ORDER — MAGNESIUM CITRATE PO SOLN: 1.0000 | Freq: Once | ORAL | Status: DC | PRN

## 2021-01-11 MED ORDER — LORATADINE 10 MG PO TABS
10.0000 mg | ORAL_TABLET | Freq: Every day | ORAL | Status: DC
  Administered 2021-01-12 – 2021-01-14 (×3): 10 mg via ORAL
  Filled 2021-01-11 (×3): qty 1

## 2021-01-11 MED ORDER — ONDANSETRON HCL 4 MG PO TABS: 4.0000 mg | ORAL_TABLET | Freq: Four times a day (QID) | ORAL | Status: DC | PRN

## 2021-01-11 MED ORDER — TRAMADOL HCL 50 MG PO TABS
50.0000 mg | ORAL_TABLET | Freq: Four times a day (QID) | ORAL | Status: DC | PRN
Start: 2021-01-11 — End: 2021-01-14
  Administered 2021-01-12 – 2021-01-13 (×3): 50 mg via ORAL
  Filled 2021-01-11 (×4): qty 1

## 2021-01-11 MED ORDER — IPRATROPIUM BROMIDE 0.02 % IN SOLN: 0.5000 mg | Freq: Four times a day (QID) | RESPIRATORY_TRACT | Status: DC | PRN

## 2021-01-11 MED ORDER — POTASSIUM CHLORIDE 20 MEQ PO PACK
40.0000 meq | PACK | Freq: Two times a day (BID) | ORAL | Status: AC
  Administered 2021-01-11 – 2021-01-12 (×2): 40 meq via ORAL
  Filled 2021-01-11 (×2): qty 2

## 2021-01-11 MED ORDER — GABAPENTIN 300 MG PO CAPS: 300.0000 mg | ORAL_CAPSULE | Freq: Every day | ORAL | Status: DC | PRN

## 2021-01-11 MED ORDER — SODIUM CHLORIDE 0.9 % IV SOLN: INTRAVENOUS | Status: DC

## 2021-01-11 MED ORDER — ALBUTEROL SULFATE (2.5 MG/3ML) 0.083% IN NEBU: 2.5000 mg | INHALATION_SOLUTION | Freq: Four times a day (QID) | RESPIRATORY_TRACT | Status: DC | PRN

## 2021-01-11 MED ORDER — ALUM & MAG HYDROXIDE-SIMETH 200-200-20 MG/5ML PO SUSP
30.0000 mL | Freq: Once | ORAL | Status: AC
  Administered 2021-01-11: 30 mL via ORAL
  Filled 2021-01-11: qty 30

## 2021-01-11 MED ORDER — PANTOPRAZOLE SODIUM 40 MG IV SOLR
40.0000 mg | INTRAVENOUS | Status: DC
  Administered 2021-01-11 – 2021-01-13 (×3): 40 mg via INTRAVENOUS
  Filled 2021-01-11 (×3): qty 40

## 2021-01-11 MED ORDER — ONDANSETRON HCL 4 MG/2ML IJ SOLN
4.0000 mg | Freq: Four times a day (QID) | INTRAMUSCULAR | Status: DC | PRN
  Administered 2021-01-13: 4 mg via INTRAVENOUS

## 2021-01-11 MED ORDER — LURASIDONE HCL 40 MG PO TABS
40.0000 mg | ORAL_TABLET | Freq: Every day | ORAL | Status: DC
  Administered 2021-01-12 – 2021-01-14 (×2): 40 mg via ORAL
  Filled 2021-01-11 (×3): qty 1

## 2021-01-11 MED ORDER — SODIUM CHLORIDE 0.9 % IV BOLUS
1000.0000 mL | Freq: Once | INTRAVENOUS | Status: AC
  Administered 2021-01-11: 1000 mL via INTRAVENOUS

## 2021-01-11 NOTE — H&P (Addendum)
History and Physical   [facility] [place] [facility] [place] Admission History and Physical [employer], D.O.    Patient Name: Robin [name] MR#: [id number] Date of Birth: [birth year] Date of Admission: 01/11/2021  Referring MD/NP/PA: Dr. Regenia [facility] Primary Care Physician: [name], MD  Chief Complaint:  Chief Complaint  Patient presents with  . Gastroesophageal Reflux    HPI: Robin [name] is a 55 y.o. female with a known history of asthma,, anxiety and depression bipolar disorder, borderline personality disorder, IBS, reflux, obstructive sleep apnea presents to the emergency department for evaluation of abdominal pain.  Patient reports three days history of symptoms consistent with prior gastroesophageal reflux disease causing upper abdominal discomfort which is worse at night, worse with food and has caused decreased appetite and decreased p.o. intake, constipation. Minimal weight loss.    Patient denies fevers/chills, weakness, dizziness, chest pain, shortness of breath, dysuria/frequency, changes in mental status.    Otherwise there has been no change in status. Patient has been taking medication as prescribed and there has been no recent change in medication or diet.  No recent antibiotics.  There has been no recent illness, hospitalizations, travel or sick contacts.    EMS/ED Course: Patient received GI cocktail, Protonix. Medical admission has been requested for further management of elevated LFTs, biliary ductal dilatation with possible pancreatic mass for MRCP.  Review of Systems:  CONSTITUTIONAL: Positive dehydration, weakness, decreased p.o. intake.  No fever/chills, fatigue, weight gain/loss, headache. EYES: No blurry or double vision. ENT: No tinnitus, postnasal drip, redness or soreness of the oropharynx. RESPIRATORY: No cough, dyspnea, wheeze.  No hemoptysis.  CARDIOVASCULAR: No chest pain, palpitations, syncope, orthopnea. No lower extremity  edema.  GASTROINTESTINAL: Positive nausea, indigestion abdominal pain, negative diarrhea, constipation.  No hematemesis, melena or hematochezia. GENITOURINARY: No dysuria, frequency, hematuria. ENDOCRINE: No polyuria or nocturia. No heat or cold intolerance. HEMATOLOGY: No anemia, bruising, bleeding. INTEGUMENTARY: No rashes, ulcers, lesions. MUSCULOSKELETAL: No arthritis, gout, dyspnea. NEUROLOGIC: No numbness, tingling, ataxia, seizure-type activity, weakness. PSYCHIATRIC: No anxiety, depression, insomnia.   Past Medical History:  Diagnosis Date  . Asthma   . Bipolar disorder ([place])   . Diverticulosis   . Gestational hypertension   . HA (headache)   . History of borderline personality disorder   . Hypersomnia, persistent 04/29/2013  . IBS (irritable bowel syndrome)   . Memory loss, short term    [name] 04-03-13   . Migraine   . Reflux esophagitis   . Seasonal allergies     Past Surgical History:  Procedure Laterality Date  . ABDOMINAL HYSTERECTOMY  00  . KNEE SURGERY Right 96,98,00,02,05,2014  . NASAL SINUS SURGERY    . NOSE SURGERY     [id number],1540  . WRIST SURGERY Right [phone]     reports that she has never smoked. She has never used smokeless tobacco. She reports that she does not drink alcohol and does not use drugs.  Allergies  Allergen Reactions  . Aspirin Nausea And Vomiting and Rash  . Bee Venom Anaphylaxis  . Penicillins Anaphylaxis  . Bactrim [Sulfamethoxazole-Trimethoprim] Nausea And Vomiting  . Sulfa Antibiotics Nausea And Vomiting    Family History  Problem Relation Age of Onset  . Depression Mother   . High Cholesterol Mother   . Hypertension Mother   . Heart failure Mother   . Hypertension Father   . High blood pressure Maternal Grandfather   . Heart attack Maternal Grandfather   . Breast cancer Maternal Grandmother   .  Diabetes Maternal Grandmother   . High blood pressure Maternal Grandmother   . Heart disease Maternal Grandmother   .  Breast cancer Sister   . Schizophrenia Brother   . Colon cancer Neg Hx   . Esophageal cancer Neg Hx   . Rectal cancer Neg Hx   . Stomach cancer Neg Hx     Prior to Admission medications   Medication Sig Start Date End Date Taking? Authorizing Provider  azelastine (ASTELIN) 0.1 % nasal spray Place 2 sprays into both nostrils 2 (two) times daily. 07/05/20   [name], MD  azithromycin [facility]) 250 MG tablet Take 2 tabs day 1, then 1 tab daily 07/05/20   [name], MD  cetirizine (ZYRTEC) 10 MG tablet Take by mouth.    [provider]  Cholecalciferol 1.25 MG (50000 UT) capsule daily    [provider]  clobetasol cream (TEMOVATE) [id number] % Apply 1 application topically 2 (two) times daily. 10/20/19   [name], MD  cyclobenzaprine (FLEXERIL) 5 MG tablet TAKE 1 TABLET(5 MG) BY MOUTH AT BEDTIME AS NEEDED FOR LOWER BACK PAIN OR SEVERE HEADACHE 08/29/20   Narda Amber K, DO  cycloSPORINE (RESTASIS) 0.05 % ophthalmic emulsion INSTILL 1 DROP IN [facility] EYE TWICE DAILY 11/23/19   [provider]  diclofenac (VOLTAREN) 50 MG EC tablet Take 1 tablet (50 mg total) by mouth every 8 (eight) hours as needed (with compazine and diphenhydramine as needed for migrianes). 06/13/20   [name], MD  dicyclomine (BENTYL) 10 MG capsule Take 1 capsule (10 mg total) by mouth in the morning and at bedtime. 03/15/20   Noralyn Pick, NP  EPINEPHrine 0.3 mg/0.3 mL IJ SOAJ injection Inject 0.3 mg into the muscle as needed for anaphylaxis. INJECT 0.3 MLS (0.3 MG TOTAL) INTO THE MUSCLE ONCE 12/14/20   [name], MD  lamoTRIgine (LAMICTAL) 100 MG tablet TAKE 1 TABLET BY MOUTH EVERY DAY 10/10/17   Dara Hoyer, PA-C  LATUDA 40 MG TABS tablet Take 1 tablet (40 mg total) by mouth daily with breakfast. 10/10/17   Dara Hoyer, PA-C  ondansetron (ZOFRAN ODT) 8 MG disintegrating tablet Take 1 tablet (8 mg total) by mouth every 8 (eight) hours as needed for nausea or  vomiting. 02/08/15   Linton Flemings, MD  predniSONE (DELTASONE) 50 MG tablet Take 1 tablet daily for 5 days. 07/25/20   [name], MD  prochlorperazine (COMPAZINE) 10 MG tablet TAKE 1 TABLET BY MOUTH EVERY 8 HOURS AS NEEDED( ALONG WITH DICLOFENAC AND DIPHENHYDRAMINE FOR MIGRAINES) 06/27/20   [name], MD  QUEtiapine (SEROQUEL XR) 200 MG 24 hr tablet Take 1 tablet (200 mg total) by mouth at bedtime. 10/10/17   Dara Hoyer, PA-C  valACYclovir (VALTREX) 1000 MG tablet Take 1,000 mg by mouth as needed.  10/20/19   [provider]    Physical Exam: Vitals:   01/11/21 1400 01/11/21 1445 01/11/21 1630 01/11/21 2042  BP: (!) 155/82 134/85 138/74 (!) 151/79  Pulse: 72 79 76 72  Resp: 20 19 19 18   Temp:    98.8 F (37.1 C)  TempSrc:    Oral  SpO2: 100% 100% 100% 97%    GENERAL: 55 y.o.-year-old black female patient, well-developed, well-nourished lying in the bed in no acute distress.  Pleasant and cooperative.   HEENT: Head atraumatic, normocephalic. Pupils equal. Mucus membranes moist.  Icteric sclerae NECK: Supple. No JVD. CHEST: Normal breath sounds bilaterally. No wheezing,  rales, rhonchi or crackles. No use of accessory muscles of respiration.  No reproducible chest wall tenderness.  CARDIOVASCULAR: S1, S2 normal. No murmurs, rubs, or gallops. Cap refill <2 seconds. Pulses intact distally.  ABDOMEN: Soft, nondistended, midepigastric tenderness to palpation no rebound, guarding, rigidity. Normoactive bowel sounds present in all four quadrants.  EXTREMITIES: No pedal edema, cyanosis, or clubbing. No calf tenderness or Homan's sign.  NEUROLOGIC: The patient is alert and oriented x 3. Cranial nerves II through XII are grossly intact with no focal sensorimotor deficit. PSYCHIATRIC:  Normal affect, mood, thought content. SKIN: Warm, dry, and intact without obvious rash, lesion, or ulcer.    Labs on Admission:  CBC: Recent Labs  Lab 01/11/21 1122  WBC 6.9  NEUTROABS 4.5   HGB 13.6  HCT 43.0  MCV 87.8  PLT [id number]   Basic Metabolic Panel: Recent Labs  Lab 01/11/21 1122  NA 139  K 3.4*  CL 103  CO2 26  GLUCOSE 105*  BUN 10  CREATININE 1.11*  CALCIUM 9.6  MG 1.9   GFR: CrCl cannot be calculated (Unknown ideal weight.). Liver Function Tests: Recent Labs  Lab 01/11/21 1122  AST 224*  ALT 505*  ALKPHOS 97  BILITOT 6.7*  PROT 6.9  ALBUMIN 4.2   Recent Labs  Lab 01/11/21 1122  LIPASE 16   No results for input(s): AMMONIA in the last 168 hours. Coagulation Profile: Recent Labs  Lab 01/11/21 1722  INR 1.0   Cardiac Enzymes: No results for input(s): CKTOTAL, CKMB, CKMBINDEX, TROPONINI in the last 168 hours. BNP (last 3 results) No results for input(s): PROBNP in the last 8760 hours. HbA1C: No results for input(s): HGBA1C in the last 72 hours. CBG: No results for input(s): GLUCAP in the last 168 hours. Lipid Profile: No results for input(s): CHOL, HDL, LDLCALC, TRIG, CHOLHDL, LDLDIRECT in the last 72 hours. Thyroid Function Tests: No results for input(s): TSH, T4TOTAL, FREET4, T3FREE, THYROIDAB in the last 72 hours. Anemia Panel: No results for input(s): VITAMINB12, FOLATE, FERRITIN, TIBC, IRON, RETICCTPCT in the last 72 hours. Urine analysis:    Component Value Date/Time   COLORURINE YELLOW 01/11/2021 1248   APPEARANCEUR CLEAR 01/11/2021 1248   LABSPEC 1.016 01/11/2021 1248   PHURINE 5.5 01/11/2021 1248   GLUCOSEU NEGATIVE 01/11/2021 [place] 01/11/2021 1248   BILIRUBINUR SMALL (A) 01/11/2021 1248   [place] 01/11/2021 1248   PROTEINUR NEGATIVE 01/11/2021 1248   UROBILINOGEN 1.0 02/07/2015 2317   NITRITE NEGATIVE 01/11/2021 1248   LEUKOCYTESUR NEGATIVE 01/11/2021 1248   Sepsis Labs: @LABRCNTIP (procalcitonin:4,lacticidven:4) ) Recent Results (from the past 240 hour(s))  Resp Panel by RT-PCR (Flu A&B, Covid) Nasopharyngeal Swab     Status: None   Collection Time: 01/11/21  5:22 PM   Specimen:  Nasopharyngeal Swab; Nasopharyngeal(NP) swabs in vial transport medium  Result Value Ref Range Status   SARS Coronavirus 2 by RT PCR NEGATIVE NEGATIVE Final    Comment: (NOTE) SARS-CoV-2 target nucleic acids are NOT DETECTED.  The SARS-CoV-2 RNA is generally detectable in upper respiratory specimens during the acute phase of infection. The lowest concentration of SARS-CoV-2 viral copies this assay can detect is 138 copies/mL. A negative result does not preclude SARS-Cov-2 infection and should not be used as the sole basis for treatment or other patient management decisions. A negative result may occur with  improper specimen collection/handling, submission of specimen other than nasopharyngeal swab, presence of viral mutation(s) within the areas targeted by this assay, and inadequate number  of viral copies(<138 copies/mL). A negative result must be combined with clinical observations, patient history, and epidemiological information. The expected result is Negative.  Fact Sheet for Patients:  EntrepreneurPulse.com.au  Fact Sheet for Healthcare Providers:  IncredibleEmployment.be  This test is no t yet approved or cleared by the [place] FDA and  has been authorized for detection and/or diagnosis of SARS-CoV-2 by FDA under an Emergency Use Authorization (EUA). This EUA will remain  in effect (meaning this test can be used) for the duration of the COVID-19 declaration under Section 564(b)(1) of the Act, 21 U.S.C.section 360bbb-3(b)(1), unless the authorization is terminated  or revoked sooner.       Influenza A by PCR NEGATIVE NEGATIVE Final   Influenza B by PCR NEGATIVE NEGATIVE Final    Comment: (NOTE) The Xpert Xpress SARS-CoV-2/FLU/RSV plus assay is intended as an aid in the diagnosis of influenza from Nasopharyngeal swab specimens and should not be used as a sole basis for treatment. Nasal washings and aspirates are unacceptable for  Xpert Xpress SARS-CoV-2/FLU/RSV testing.  Fact Sheet for Patients: EntrepreneurPulse.com.au  Fact Sheet for Healthcare Providers: IncredibleEmployment.be  This test is not yet approved or cleared by the [place] FDA and has been authorized for detection and/or diagnosis of SARS-CoV-2 by FDA under an Emergency Use Authorization (EUA). This EUA will remain in effect (meaning this test can be used) for the duration of the COVID-19 declaration under Section 564(b)(1) of the Act, 21 U.S.C. section 360bbb-3(b)(1), unless the authorization is terminated or revoked.  Performed at [facility], [address] [place], [place] [address]      Radiological Exams on Admission: CT Abdomen Pelvis Wo Contrast  Result Date: 01/11/2021 CLINICAL DATA:  Epigastric and left upper quadrant abdominal pain for 1 week. Decreased appetite. EXAM: CT ABDOMEN AND PELVIS WITHOUT CONTRAST TECHNIQUE: Multidetector CT imaging of the abdomen and pelvis was performed following the standard protocol without IV contrast. COMPARISON:  None. FINDINGS: Lower chest: No acute findings. Hepatobiliary: 2.9 cm low-attenuation mass is seen in segment 4A which cannot be characterized on this unenhanced exam. Several other tiny sub-cm low-attenuation lesions are also seen in the right and left lobes which may represent tiny cysts but cannot be definitively characterized without IV contrast. Gallbladder is unremarkable. Diffuse biliary ductal dilatation is seen with common bile duct measuring approximately 2 cm in diameter. Pancreas: Soft tissue prominence is noted in the region of the pancreatic head, and pancreatic mass cannot be excluded on this unenhanced exam. No evidence of pancreatic ductal dilatation or peripancreatic inflammatory changes. Spleen:  Within normal limits in size. Adrenals/Urinary tract: No evidence of urolithiasis or hydronephrosis. Unremarkable unopacified  urinary bladder. Stomach/Bowel: No evidence of obstruction, inflammatory process, or abnormal fluid collections. Normal appendix visualized. Right-sided colonic diverticulosis is noted, however there is no evidence of diverticulitis. Vascular/Lymphatic: No pathologically enlarged lymph nodes identified. No evidence of abdominal aortic aneurysm. Reproductive: Prior hysterectomy noted. Adnexal regions are unremarkable in appearance. Other:  None. Musculoskeletal:  No suspicious bone lesions identified. IMPRESSION: Multiple low-attenuation liver lesions, which cannot be characterized on this unenhanced exam. Abdomen MRI without and with contrast is recommended for further characterization. Diffuse biliary ductal dilatation, with soft tissue prominence in the region of the pancreatic head. Pancreatic mass cannot be excluded on this unenhanced exam. Recommend abdomen MRI and MRCP without and with contrast for further evaluation. Colonic diverticulosis, without radiographic evidence of diverticulitis. Electronically Signed   By: Marlaine Hind M.D.   On: 01/11/2021 16:05  DG Chest Port 1 View  Result Date: 01/11/2021 CLINICAL DATA:  Chest pain. EXAM: PORTABLE CHEST 1 VIEW COMPARISON:  03/13/2016. FINDINGS: The heart size and mediastinal contours are within normal limits. Both lungs are clear. No visible pleural effusions or pneumothorax. No acute osseous abnormality. IMPRESSION: No active disease. Electronically Signed   By: Margaretha Sheffield MD   On: 01/11/2021 13:19    EKG: Normal sinus rhythm at 74 bpm with normal axis and nonspecific ST-T wave changes.   Assessment/Plan  This is a 55 y.o. female with a history of asthma,, anxiety and depression bipolar disorder, borderline personality disorder, IBS, reflux, obstructive sleep apnea now being admitted with:  #.  Elevated LFTs, biliary ductal dilatation, possible pancreatic mass - Admit inpatient - N.p.o. for tests in AM -Check right upper quadrant  ultrasound - Dr. Azucena Freed, gastroenterology has been consulted by the emergency department physician and she recommends MRI/MRCP - Pain control - IV PPI - Check hepatitis panel, alcohol level, HIV  #. Acute kidney injury likely secondary to dehydration/decreased p.o. intake - IV fluids and repeat BMP in AM.  -Trend BMP  #.  Mild hypokalemia - Replace orally - Trend BMP  #.  History of asthma/allergies Continue Astelin, Pulmicort, Claritin  #. History of anxiety, depression, bipolar disorder, borderline personality disorder - Continue Lamictal, Latuda Neurontin  Admission status: Inpatient IV Fluids: Normal saline Diet/Nutrition: N.p.o. pending studies Consults called: GI Dr. Glyn Ade has been called by emergency department physician DVT Px: SCDs and early ambulation. Code Status: Full Code  Disposition Plan: To home in 1-2 days  All the records are reviewed and case discussed with ED provider. Management plans discussed with the patient and/or family who express understanding and agree with plan of care.  Lisamarie Coke D.O. on 01/11/2021 at 8:57 PM CC: Primary care physician; [name], MD   01/11/2021, 8:57 PM

## 2021-01-11 NOTE — ED Triage Notes (Signed)
Pt. Tells me that she has had recurrence of GERDfor past few days. She c/p some upper abd. Discomfort, which is worse at night. She states that because of this feeling, her appetite and fluid intake has decreased. She tells me she feels "dehydrated". She also tells me she has an impending app[t. With her pcp for this symptomology. She is in no distress.

## 2021-01-11 NOTE — ED Provider Notes (Signed)
[facility] EMERGENCY DEPT Provider Note   CSN: [id number] Arrival date & time: 01/11/21  1014     History Chief Complaint  Patient presents with  . Gastroesophageal Reflux    Robin [name] is a 55 y.o. female.  Patient with a complaint of epigastric abdominal pain.  And also discomfort in the anterior chest that she thinks is reflux disease.  Its actually been hurting for the patient to eat or drink.  She talks about having some cramping in her muscles.  Therefore her p.o. intake has been poor.  And she is concerned about being dehydrated.  Patient had a history long time ago reflux disease but has not any trouble lately.  Patient has not on anything for reflux currently.  Past medical history significant for migraine history of borderline personality disorder bipolar disorder.  Gestational hypertension irritable bowel syndrome and reflux esophagitis.  Patient still has her gallbladder.  It has not been removed.        Past Medical History:  Diagnosis Date  . Asthma   . Bipolar disorder ([facility])   . Diverticulosis   . Gestational hypertension   . HA (headache)   . History of borderline personality disorder   . Hypersomnia, persistent 04/29/2013  . IBS (irritable bowel syndrome)   . Memory loss, short term    [name] 04-03-13   . Migraine   . Reflux esophagitis   . Seasonal allergies     Patient Active Problem List   Diagnosis Date Noted  . Hyperglycemia 12/30/2019  . Dyslipidemia 12/30/2019  . DDD (degenerative disc disease), lumbar 10/10/2017  . Primary narcolepsy without cataplexy 09/03/2017  . Syncope 07/08/2017  . Diverticulosis 05/01/2017  . Stomach ulcer 04/01/2017  . Narcolepsy due to underlying condition without cataplexy 07/18/2016  . Chronic pansinusitis 01/27/2016  . History of colonoscopy with polypectomy 04/14/2015  . Bipolar I disorder ([place]) 11/23/2014  . Obesity 12/23/2013  . Sleep apnea 11/25/2013  . Migraine 10/28/2013  . Hypersomnia,  persistent 04/29/2013  . Memory loss, short term   . Cognitive decline 04/03/2013  . Depression 04/03/2013    Past Surgical History:  Procedure Laterality Date  . ABDOMINAL HYSTERECTOMY  00  . KNEE SURGERY Right 96,98,00,02,05,2014  . NASAL SINUS SURGERY    . NOSE SURGERY     [id number]  . WRIST SURGERY Right [id number]     OB History   No obstetric history on file.     Family History  Problem Relation Age of Onset  . Depression Mother   . High Cholesterol Mother   . Hypertension Mother   . Heart failure Mother   . Hypertension Father   . High blood pressure Maternal Grandfather   . Heart attack Maternal Grandfather   . Breast cancer Maternal Grandmother   . Diabetes Maternal Grandmother   . High blood pressure Maternal Grandmother   . Heart disease Maternal Grandmother   . Breast cancer Sister   . Schizophrenia Brother   . Colon cancer Neg Hx   . Esophageal cancer Neg Hx   . Rectal cancer Neg Hx   . Stomach cancer Neg Hx     Social History   Tobacco Use  . Smoking status: Never Smoker  . Smokeless tobacco: Never Used  Vaping Use  . Vaping Use: Never used  Substance Use Topics  . Alcohol use: No    Comment: 2-3 drinks per year  . Drug use: No    Home Medications Prior to Admission medications  Medication Sig Start Date End Date Taking? Authorizing Provider  azelastine (ASTELIN) 0.1 % nasal spray Place 2 sprays into both nostrils 2 (two) times daily. 07/05/20   [name], MD  azithromycin [facility]) 250 MG tablet Take 2 tabs day 1, then 1 tab daily 07/05/20   [name], MD  cetirizine (ZYRTEC) 10 MG tablet Take by mouth.    [provider]  Cholecalciferol 1.25 MG (50000 UT) capsule daily    [provider]  clobetasol cream (TEMOVATE) 3.00 % Apply 1 application topically 2 (two) times daily. 10/20/19   [name], MD  cyclobenzaprine (FLEXERIL) 5 MG tablet TAKE 1 TABLET(5 MG) BY MOUTH AT BEDTIME AS NEEDED FOR LOWER BACK  PAIN OR SEVERE HEADACHE 08/29/20   Narda Amber K, DO  cycloSPORINE (RESTASIS) 0.05 % ophthalmic emulsion INSTILL 1 DROP IN [facility] EYE TWICE DAILY 11/23/19   [provider]  diclofenac (VOLTAREN) 50 MG EC tablet Take 1 tablet (50 mg total) by mouth every 8 (eight) hours as needed (with compazine and diphenhydramine as needed for migrianes). 06/13/20   [name], MD  dicyclomine (BENTYL) 10 MG capsule Take 1 capsule (10 mg total) by mouth in the morning and at bedtime. 03/15/20   Noralyn Pick, NP  EPINEPHrine 0.3 mg/0.3 mL IJ SOAJ injection Inject 0.3 mg into the muscle as needed for anaphylaxis. INJECT 0.3 MLS (0.3 MG TOTAL) INTO THE MUSCLE ONCE 12/14/20   [name], MD  lamoTRIgine (LAMICTAL) 100 MG tablet TAKE 1 TABLET BY MOUTH EVERY DAY 10/10/17   Dara Hoyer, PA-C  LATUDA 40 MG TABS tablet Take 1 tablet (40 mg total) by mouth daily with breakfast. 10/10/17   Dara Hoyer, PA-C  ondansetron (ZOFRAN ODT) 8 MG disintegrating tablet Take 1 tablet (8 mg total) by mouth every 8 (eight) hours as needed for nausea or vomiting. 02/08/15   Linton Flemings, MD  predniSONE (DELTASONE) 50 MG tablet Take 1 tablet daily for 5 days. 07/25/20   [name], MD  prochlorperazine (COMPAZINE) 10 MG tablet TAKE 1 TABLET BY MOUTH EVERY 8 HOURS AS NEEDED( ALONG WITH DICLOFENAC AND DIPHENHYDRAMINE FOR MIGRAINES) 06/27/20   [name], MD  QUEtiapine (SEROQUEL XR) 200 MG 24 hr tablet Take 1 tablet (200 mg total) by mouth at bedtime. 10/10/17   Dara Hoyer, PA-C  valACYclovir (VALTREX) 1000 MG tablet Take 1,000 mg by mouth as needed.  10/20/19   [provider]    Allergies    Aspirin, Bee venom, Penicillins, Bactrim [sulfamethoxazole-trimethoprim], and Sulfa antibiotics  Review of Systems   Review of Systems  Constitutional: Positive for appetite change. Negative for chills and fever.  HENT: Negative for rhinorrhea and sore throat.   Eyes: Negative for visual  disturbance.  Respiratory: Negative for cough and shortness of breath.   Cardiovascular: Positive for chest pain. Negative for leg swelling.  Gastrointestinal: Positive for abdominal pain. Negative for diarrhea, nausea and vomiting.  Genitourinary: Negative for dysuria.  Musculoskeletal: Negative for back pain and neck pain.  Skin: Negative for rash.  Neurological: Negative for dizziness, light-headedness and headaches.  Hematological: Does not bruise/bleed easily.  Psychiatric/Behavioral: Negative for confusion.    Physical Exam Updated Vital Signs BP 134/85   Pulse 79   Temp 98.4 F (36.9 C) (Oral)   Resp 19   SpO2 100%   Physical Exam Vitals and nursing note reviewed.  Constitutional:      General: She is not in  acute distress.    Appearance: Normal appearance. She is well-developed.  HENT:     Head: Normocephalic and atraumatic.     Mouth/Throat:     Mouth: Mucous membranes are dry.  Eyes:     Extraocular Movements: Extraocular movements intact.     Conjunctiva/sclera: Conjunctivae normal.     Pupils: Pupils are equal, round, and reactive to light.  Cardiovascular:     Rate and Rhythm: Normal rate and regular rhythm.     Heart sounds: No murmur heard.   Pulmonary:     Effort: Pulmonary effort is normal. No respiratory distress.     Breath sounds: Normal breath sounds.  Chest:     Chest wall: No tenderness.  Abdominal:     Palpations: Abdomen is soft.     Tenderness: There is abdominal tenderness.     Comments: Some mild tenderness to palpation to the epigastric part of the abdomen.  Musculoskeletal:        General: Normal range of motion.     Cervical back: Normal range of motion and neck supple.  Skin:    General: Skin is warm and dry.     Capillary Refill: Capillary refill takes less than 2 seconds.  Neurological:     General: No focal deficit present.     Mental Status: She is alert and oriented to person, place, and time.     Cranial Nerves: No cranial  nerve deficit.     Sensory: No sensory deficit.     Motor: No weakness.     ED Results / Procedures / Treatments   Labs (all labs ordered are listed, but only abnormal results are displayed) Labs Reviewed  COMPREHENSIVE METABOLIC PANEL - Abnormal; Notable for the following components:      Result Value   Potassium 3.4 (*)    Glucose, Bld 105 (*)    Creatinine, Ser 1.11 (*)    AST 224 (*)    ALT 505 (*)    Total Bilirubin 6.7 (*)    GFR, Estimated 59 (*)    All other components within normal limits  URINALYSIS, ROUTINE W REFLEX MICROSCOPIC - Abnormal; Notable for the following components:   Bilirubin Urine SMALL (*)    All other components within normal limits  CBC WITH DIFFERENTIAL/PLATELET  LIPASE, BLOOD  MAGNESIUM  TROPONIN I (HIGH SENSITIVITY)  TROPONIN I (HIGH SENSITIVITY)    EKG EKG Interpretation  Date/Time:  Wednesday Jan 11 2021 13:22:03 EDT Ventricular Rate:  74 PR Interval:  133 QRS Duration: 75 QT Interval:  401 QTC Calculation: 445 R Axis:   57 Text Interpretation: Sinus rhythm Confirmed by Fredia Sorrow [phone]) on 01/11/2021 1:53:21 PM   Radiology CT Abdomen Pelvis Wo Contrast  Result Date: 01/11/2021 CLINICAL DATA:  Epigastric and left upper quadrant abdominal pain for 1 week. Decreased appetite. EXAM: CT ABDOMEN AND PELVIS WITHOUT CONTRAST TECHNIQUE: Multidetector CT imaging of the abdomen and pelvis was performed following the standard protocol without IV contrast. COMPARISON:  None. FINDINGS: Lower chest: No acute findings. Hepatobiliary: 2.9 cm low-attenuation mass is seen in segment 4A which cannot be characterized on this unenhanced exam. Several other tiny sub-cm low-attenuation lesions are also seen in the right and left lobes which may represent tiny cysts but cannot be definitively characterized without IV contrast. Gallbladder is unremarkable. Diffuse biliary ductal dilatation is seen with common bile duct measuring approximately 2 cm in  diameter. Pancreas: Soft tissue prominence is noted in the region of the pancreatic head, and  pancreatic mass cannot be excluded on this unenhanced exam. No evidence of pancreatic ductal dilatation or peripancreatic inflammatory changes. Spleen:  Within normal limits in size. Adrenals/Urinary tract: No evidence of urolithiasis or hydronephrosis. Unremarkable unopacified urinary bladder. Stomach/Bowel: No evidence of obstruction, inflammatory process, or abnormal fluid collections. Normal appendix visualized. Right-sided colonic diverticulosis is noted, however there is no evidence of diverticulitis. Vascular/Lymphatic: No pathologically enlarged lymph nodes identified. No evidence of abdominal aortic aneurysm. Reproductive: Prior hysterectomy noted. Adnexal regions are unremarkable in appearance. Other:  None. Musculoskeletal:  No suspicious bone lesions identified. IMPRESSION: Multiple low-attenuation liver lesions, which cannot be characterized on this unenhanced exam. Abdomen MRI without and with contrast is recommended for further characterization. Diffuse biliary ductal dilatation, with soft tissue prominence in the region of the pancreatic head. Pancreatic mass cannot be excluded on this unenhanced exam. Recommend abdomen MRI and MRCP without and with contrast for further evaluation. Colonic diverticulosis, without radiographic evidence of diverticulitis. Electronically Signed   By: Marlaine Hind M.D.   On: 01/11/2021 16:05   DG Chest Port 1 View  Result Date: 01/11/2021 CLINICAL DATA:  Chest pain. EXAM: PORTABLE CHEST 1 VIEW COMPARISON:  03/13/2016. FINDINGS: The heart size and mediastinal contours are within normal limits. Both lungs are clear. No visible pleural effusions or pneumothorax. No acute osseous abnormality. IMPRESSION: No active disease. Electronically Signed   By: Margaretha Sheffield MD   On: 01/11/2021 13:19    Procedures Procedures   Medications Ordered in ED Medications  0.9 %   sodium chloride infusion ( Intravenous New Bag/Given 01/11/21 1311)  sodium chloride 0.9 % bolus 1,000 mL (0 mLs Intravenous Stopped 01/11/21 1414)  pantoprazole (PROTONIX) injection 40 mg (40 mg Intravenous Given 01/11/21 1305)  alum & mag hydroxide-simeth (MAALOX/MYLANTA) 200-200-20 MG/5ML suspension 30 mL (30 mLs Oral Given 01/11/21 1304)    ED Course  I have reviewed the triage vital signs and the nursing notes.  Pertinent labs & imaging results that were available during my care of the patient were reviewed by me and considered in my medical decision making (see chart for details).    MDM Rules/Calculators/A&P                          Work-up with some significant lab findings.  Troponins x2 are normal.  Patient also received GI cocktail and Protonix IV with a little bit of provement in the chest pain and the epigastric pain.  Urinalysis negative for urinary tract infection.  Lipase is normal. No leukocytosis.  Hemoglobin normal at 13.6.  Liver function test with abnormality AST is 224 ALT 505 total bili 6.7 alk phos is normal.  Renal function GFR is 59.  No gap.  Magnesium normal.  Chest x-ray without any acute findings.  No significant electrolyte abnormalities except for potassium just slightly below normal at 3.4. Calcium is normal.  EKG without any acute changes  Patient denies any alcohol intake.  But based on the markedly elevated liver function test patient will get CT scan abdomen to take a broad picture.  With alk phos being normal do not think this is necessarily biliary obstruction.  Could be hepatitis.  Would expect LFTs to be higher.  But they could be improving.  Patient turned over to Dr. Verner Chol in the evening emergency physician who will follow-up on the CT scans.  Final Clinical Impression(s) / ED Diagnoses Final diagnoses:  Atypical chest pain  Epigastric abdominal pain  Rx / DC Orders ED Discharge Orders    None       Fredia Sorrow, MD 01/11/21  1609

## 2021-01-11 NOTE — Plan of Care (Signed)
  Problem: Pain Managment: Goal: General experience of comfort will improve Description: Patient's general experience of comfort will improve by 01/15/21 Outcome: Progressing

## 2021-01-11 NOTE — ED Notes (Signed)
Pt's EKG given to Dr. Rogene Houston

## 2021-01-11 NOTE — ED Provider Notes (Signed)
Care transferred to me.  Patient's CT is concerning for possible pancreatic mass but definitively has bile duct dilation.  Discussed with Azucena Freed of [facility] who recommends admission and MRI/MRCP.  Discussed with hospitalist for admission.   Sherwood Gambler, MD 01/11/21 [phone]

## 2021-01-12 ENCOUNTER — Ambulatory Visit: Payer: 59 | Admitting: Family Medicine

## 2021-01-12 ENCOUNTER — Inpatient Hospital Stay (HOSPITAL_COMMUNITY): Payer: 59

## 2021-01-12 ENCOUNTER — Encounter (HOSPITAL_COMMUNITY): Payer: Self-pay | Admitting: Family Medicine

## 2021-01-12 DIAGNOSIS — K831 Obstruction of bile duct: Secondary | ICD-10-CM

## 2021-01-12 DIAGNOSIS — R17 Unspecified jaundice: Secondary | ICD-10-CM

## 2021-01-12 DIAGNOSIS — K769 Liver disease, unspecified: Secondary | ICD-10-CM

## 2021-01-12 LAB — CBC
HCT: 42 % (ref 36.0–46.0)
Hemoglobin: 12.8 g/dL (ref 12.0–15.0)
MCH: 27.4 pg (ref 26.0–34.0)
MCHC: 30.5 g/dL (ref 30.0–36.0)
MCV: 89.7 fL (ref 80.0–100.0)
Platelets: 198 10*3/uL (ref 150–400)
RBC: 4.68 MIL/uL (ref 3.87–5.11)
RDW: 14.2 % (ref 11.5–15.5)
WBC: 7.4 10*3/uL (ref 4.0–10.5)
nRBC: 0 % (ref 0.0–0.2)

## 2021-01-12 LAB — COMPREHENSIVE METABOLIC PANEL
ALT: 500 U/L — ABNORMAL HIGH (ref 0–44)
AST: 243 U/L — ABNORMAL HIGH (ref 15–41)
Albumin: 3.4 g/dL — ABNORMAL LOW (ref 3.5–5.0)
Alkaline Phosphatase: 92 U/L (ref 38–126)
Anion gap: 9 (ref 5–15)
BUN: 8 mg/dL (ref 6–20)
CO2: 19 mmol/L — ABNORMAL LOW (ref 22–32)
Calcium: 9 mg/dL (ref 8.9–10.3)
Chloride: 111 mmol/L (ref 98–111)
Creatinine, Ser: 0.73 mg/dL (ref 0.44–1.00)
GFR, Estimated: >60 mL/min (ref >60)
Glucose, Bld: 104 mg/dL — ABNORMAL HIGH (ref 70–99)
Potassium: 4.3 mmol/L (ref 3.5–5.1)
Sodium: 139 mmol/L (ref 135–145)
Total Bilirubin: 6.7 mg/dL — ABNORMAL HIGH (ref 0.3–1.2)
Total Protein: 6.3 g/dL — ABNORMAL LOW (ref 6.5–8.1)

## 2021-01-12 LAB — HEPATITIS PANEL, ACUTE
HCV Ab: NONREACTIVE
Hep A IgM: NONREACTIVE
Hep B C IgM: NONREACTIVE
Hepatitis B Surface Ag: NONREACTIVE

## 2021-01-12 LAB — ETHANOL: Alcohol, Ethyl (B): <10 mg/dL (ref <10)

## 2021-01-12 LAB — HIV ANTIBODY (ROUTINE TESTING W REFLEX): HIV Screen 4th Generation wRfx: NONREACTIVE

## 2021-01-12 MED ORDER — GADOBUTROL 1 MMOL/ML IV SOLN
10.0000 mL | Freq: Once | INTRAVENOUS | Status: AC | PRN
  Administered 2021-01-12: 10 mL via INTRAVENOUS

## 2021-01-12 MED ORDER — DIPHENHYDRAMINE HCL 25 MG PO CAPS
25.0000 mg | ORAL_CAPSULE | Freq: Four times a day (QID) | ORAL | Status: DC | PRN
  Administered 2021-01-12 – 2021-01-13 (×2): 25 mg via ORAL
  Filled 2021-01-12 (×2): qty 1

## 2021-01-12 MED ORDER — BOOST / RESOURCE BREEZE PO LIQD CUSTOM
1.0000 | Freq: Three times a day (TID) | ORAL | Status: DC
  Administered 2021-01-12 – 2021-01-14 (×3): 1 via ORAL

## 2021-01-12 NOTE — Progress Notes (Addendum)
PROGRESS NOTE    Robin [name]  BPZ:[id number] DOB: 1965/08/28 DOA: 01/11/2021 PCP: [name], MD   Chief Complaint  Patient presents with  . Gastroesophageal Reflux    Brief Narrative:  Robin [name] is Robin [name] 55 y.o. female with Robin [name] known history of asthma,, anxiety and depression bipolar disorder, borderline personality disorder, IBS, reflux, obstructive sleep apnea presents to the emergency department for evaluation of abdominal pain.  Patient reports three days history of symptoms consistent with prior gastroesophageal reflux disease causing upper abdominal discomfort which is worse at night, worse with food and has caused decreased appetite and decreased p.o. intake, constipation. Minimal weight loss.    Patient denies fevers/chills, weakness, dizziness, chest pain, shortness of breath, dysuria/frequency, changes in mental status.    Otherwise there has been no change in status. Patient has been taking medication as prescribed and there has been no recent change in medication or diet.  No recent antibiotics.  There has been no recent illness, hospitalizations, travel or sick contacts.    EMS/ED Course: Patient received GI cocktail, Protonix. Medical admission has been requested for further management of elevated LFTs, biliary ductal dilatation with possible pancreatic mass for MRCP.    Assessment & Plan:   Active Problems:   Elevated LFTs  This is Robin [name] 55 y.o. female with Robin [name] history of asthma,, anxiety and depression bipolar disorder, borderline personality disorder, IBS, reflux, obstructive sleep apnea now being admitted with:  #.  Elevated LFTs, biliary ductal dilatation, possible pancreatic mass - MRCP with biliary duct distension of both intra and extrahepatic biliary tree with abrupt caliber change, but with no visible mass on imaging.  Endoscopic assessment suggested to exclude occult biliary lesion or atypical stricture -- RUQ [place] without gallstones, distention of CBD, mild  intrahepatic biliary ductal distention -- negative etoh -- LFT's relatively stable today, AST 243, ALT 500, tbili 6.7 - IV PPI - Check hepatitis panel (pending), HIV (negative) -- GI c/s, planning ERCP  # Lobulated T2 Bright Lesion in Medial segment of L hepatic lobe  Hyperecholic Lesions noted in the R Hepatic Lobe - per rad, favors clerosed hemangioma - consider 3-6 month follow up with extended delays following contrast to 7 min - hyperechoic lesions on [place] could represent hemangiomas, metastatic disease can't be excluded  #. Acute kidney injury likely secondary to dehydration/decreased p.o. intake - improved with IVF  #.  Mild hypokalemia - improved  #.  History of asthma/allergies Continue Astelin, Pulmicort, Claritin  #. History of anxiety, depression, bipolar disorder, borderline personality disorder - Continue Lamictal, Latuda Neurontin  DVT prophylaxis: SCD Code Status: full  Family Communication: none at bedside Disposition:   Status is: Inpatient  Remains inpatient appropriate because:Inpatient level of care appropriate due to severity of illness   Dispo: The patient is from: Home              Anticipated d/c is to: pending              Patient currently is not medically stable to d/c.   Difficult to place patient No   Consultants:   GI  Procedures:  none  Antimicrobials: Anti-infectives (From admission, onward)   None      Subjective: C/o abdominal discomfort  Objective: Vitals:   01/11/21 2042 01/12/21 0047 01/12/21 0445 01/12/21 1051  BP: (!) 151/79 (!) 149/82 132/78   Pulse: 72 68 79   Resp: 18 18 18    Temp: 98.8 F (37.1 C) 99 F (37.2 C) 98.5 F (  36.9 C)   TempSrc: Oral Oral Oral   SpO2: 97% 98% 97%   Weight:    97.6 kg    Intake/Output Summary (Last 24 hours) at 01/12/2021 1135 Last data filed at 01/12/2021 0340 Gross per 24 hour  Intake 1970.96 ml  Output --  Net 1970.96 ml   Filed Weights   01/12/21 1051  Weight:  97.6 kg    Examination:  General exam: Appears calm and comfortable  Icteric sclera Respiratory system: unlabored Cardiovascular system: RRR Gastrointestinal system: Abdomen is nondistended, soft and mild abdominal discomfort  Central nervous system: Alert and oriented. No focal neurological deficits. Extremities: Symmetric 5 x 5 power. Skin: No rashes, lesions or ulcers Psychiatry: Judgement and insight appear normal. Mood & affect appropriate.     Data Reviewed: I have personally reviewed following labs and imaging studies  CBC: Recent Labs  Lab 01/11/21 1122 01/12/21 0115  WBC 6.9 7.4  NEUTROABS 4.5  --   HGB 13.6 12.8  HCT 43.0 42.0  MCV 87.8 89.7  PLT 217 409    Basic Metabolic Panel: Recent Labs  Lab 01/11/21 1122 01/12/21 0115  NA 139 139  K 3.4* 4.3  CL 103 111  CO2 26 19*  GLUCOSE 105* 104*  BUN 10 8  CREATININE 1.11* 0.73  CALCIUM 9.6 9.0  MG 1.9  --     GFR: CrCl cannot be calculated (Unknown ideal weight.).  Liver Function Tests: Recent Labs  Lab 01/11/21 1122 01/12/21 0115  AST 224* 243*  ALT 505* 500*  ALKPHOS 97 92  BILITOT 6.7* 6.7*  PROT 6.9 6.3*  ALBUMIN 4.2 3.4*    CBG: No results for input(s): GLUCAP in the last 168 hours.   Recent Results (from the past 240 hour(s))  Resp Panel by RT-PCR (Flu Robin [name]&B, Covid) Nasopharyngeal Swab     Status: None   Collection Time: 01/11/21  5:22 PM   Specimen: Nasopharyngeal Swab; Nasopharyngeal(NP) swabs in vial transport medium  Result Value Ref Range Status   SARS Coronavirus 2 by RT PCR NEGATIVE NEGATIVE Final    Comment: (NOTE) SARS-CoV-2 target nucleic acids are NOT DETECTED.  The SARS-CoV-2 RNA is generally detectable in upper respiratory specimens during the acute phase of infection. The lowest concentration of SARS-CoV-2 viral copies this assay can detect is 138 copies/mL. Robin [name] negative result does not preclude SARS-Cov-2 infection and should not be used as the sole basis for  treatment or other patient management decisions. Robin [name] negative result may occur with  improper specimen collection/handling, submission of specimen other than nasopharyngeal swab, presence of viral mutation(s) within the areas targeted by this assay, and inadequate number of viral copies(<138 copies/mL). Robin [name] negative result must be combined with clinical observations, patient history, and epidemiological information. The expected result is Negative.  Fact Sheet for Patients:  EntrepreneurPulse.com.au  Fact Sheet for Healthcare Providers:  IncredibleEmployment.be  This test is no t yet approved or cleared by the [place] FDA and  has been authorized for detection and/or diagnosis of SARS-CoV-2 by FDA under an Emergency Use Authorization (EUA). This EUA will remain  in effect (meaning this test can be used) for the duration of the COVID-19 declaration under Section 564(b)(1) of the Act, 21 U.S.C.section 360bbb-3(b)(1), unless the authorization is terminated  or revoked sooner.       Influenza Robin [name] by PCR NEGATIVE NEGATIVE Final   Influenza B by PCR NEGATIVE NEGATIVE Final    Comment: (NOTE) The Xpert Xpress SARS-CoV-2/FLU/RSV plus assay is intended as  an aid in the diagnosis of influenza from Nasopharyngeal swab specimens and should not be used as Robin [name] sole basis for treatment. Nasal washings and aspirates are unacceptable for Xpert Xpress SARS-CoV-2/FLU/RSV testing.  Fact Sheet for Patients: EntrepreneurPulse.com.au  Fact Sheet for Healthcare Providers: IncredibleEmployment.be  This test is not yet approved or cleared by the [place] FDA and has been authorized for detection and/or diagnosis of SARS-CoV-2 by FDA under an Emergency Use Authorization (EUA). This EUA will remain in effect (meaning this test can be used) for the duration of the COVID-19 declaration under Section 564(b)(1) of the Act, 21  U.S.C. section 360bbb-3(b)(1), unless the authorization is terminated or revoked.  Performed at [facility], [address], [place], [place] [address]          Radiology Studies: CT Abdomen Pelvis Wo Contrast  Result Date: 01/11/2021 CLINICAL DATA:  Epigastric and left upper quadrant abdominal pain for 1 week. Decreased appetite. EXAM: CT ABDOMEN AND PELVIS WITHOUT CONTRAST TECHNIQUE: Multidetector CT imaging of the abdomen and pelvis was performed following the standard protocol without IV contrast. COMPARISON:  None. FINDINGS: Lower chest: No acute findings. Hepatobiliary: 2.9 cm low-attenuation mass is seen in segment 4A which cannot be characterized on this unenhanced exam. Several other tiny sub-cm low-attenuation lesions are also seen in the right and left lobes which may represent tiny cysts but cannot be definitively characterized without IV contrast. Gallbladder is unremarkable. Diffuse biliary ductal dilatation is seen with common bile duct measuring approximately 2 cm in diameter. Pancreas: Soft tissue prominence is noted in the region of the pancreatic head, and pancreatic mass cannot be excluded on this unenhanced exam. No evidence of pancreatic ductal dilatation or peripancreatic inflammatory changes. Spleen:  Within normal limits in size. Adrenals/Urinary tract: No evidence of urolithiasis or hydronephrosis. Unremarkable unopacified urinary bladder. Stomach/Bowel: No evidence of obstruction, inflammatory process, or abnormal fluid collections. Normal appendix visualized. Right-sided colonic diverticulosis is noted, however there is no evidence of diverticulitis. Vascular/Lymphatic: No pathologically enlarged lymph nodes identified. No evidence of abdominal aortic aneurysm. Reproductive: Prior hysterectomy noted. Adnexal regions are unremarkable in appearance. Other:  None. Musculoskeletal:  No suspicious bone lesions identified. IMPRESSION: Multiple  low-attenuation liver lesions, which cannot be characterized on this unenhanced exam. Abdomen MRI without and with contrast is recommended for further characterization. Diffuse biliary ductal dilatation, with soft tissue prominence in the region of the pancreatic head. Pancreatic mass cannot be excluded on this unenhanced exam. Recommend abdomen MRI and MRCP without and with contrast for further evaluation. Colonic diverticulosis, without radiographic evidence of diverticulitis. Electronically Signed   By: Marlaine Hind M.D.   On: 01/11/2021 16:05   DG Chest Port 1 View  Result Date: 01/11/2021 CLINICAL DATA:  Chest pain. EXAM: PORTABLE CHEST 1 VIEW COMPARISON:  03/13/2016. FINDINGS: The heart size and mediastinal contours are within normal limits. Both lungs are clear. No visible pleural effusions or pneumothorax. No acute osseous abnormality. IMPRESSION: No active disease. Electronically Signed   By: Margaretha Sheffield MD   On: 01/11/2021 13:19        Scheduled Meds: . fluticasone  1 spray Each Nare Daily  . lamoTRIgine  100 mg Oral Daily  . loratadine  10 mg Oral Daily  . lurasidone  40 mg Oral Q breakfast  . pantoprazole (PROTONIX) IV  40 mg Intravenous Q24H   Continuous Infusions: . sodium chloride 100 mL/hr at 01/11/21 2335     LOS: 1 day    Time spent: over 30  min    Fayrene Helper, MD [facility]   To contact the attending provider between 7A-7P or the covering provider during after hours 7P-7A, please log into the web site [email or web address] and access using universal [place] password for that web site. If you do not have the password, please call the hospital operator.  01/12/2021, 11:35 AM

## 2021-01-12 NOTE — Consult Note (Addendum)
Consultation  Referring Provider: [facility]/ Florene Glen MD Primary Care Physician:  [name], MD Primary Gastroenterologist:  Dr.Stark  Reason for Consultation: Epigastric pain, elevated LFTs and abnormal imaging  HPI: Robin [name] is a 55 y.o. female, who was admitted through the emergency room last evening after she presented with complaints of epigastric pain similar to reflux symptoms over the prior 3 days but associated with decrease in appetite, and somewhat progressive pain which was constant.  Some nausea or vomiting, no documented fever or chills.  Patient has history of asthma, chronic anxiety, bipolar disorder, borderline personality, IBS and sleep apnea.  Also with diverticulosis. Known to Dr. Fuller Plan from colonoscopy done in July 2021 with finding of multiple diverticuli in the left colon, few diverticuli in the right 5 mm polyp was removed from the transverse colon which by path was benign polypoid mucosa.  Labs done yesterday through the emergency room with normal CBC, creatinine 1.1 T bili 6.7/alk phos 97/AST 224/ALT 505 ,lipase within normal limits INR within normal limits COVID screen negative. LFTs repeated today no significant change.  Abdominal ultrasound is pending.  She had CT of the abdomen pelvis last night with finding of a 2.9 cm mass segment 4A low-attenuation and several other subcentimeter similar-appearing lesions.  Gallbladder within normal limits, there is diffuse ductal dilation with CBD of 2 cm and soft tissue prominence in the pancreatic head.  MRI/MRCP today with marked biliary ductal dilation, there was abrupt caliber change through the pancreatic portion of the common bile duct but no definite lesion or stricture.  Gallbladder is distended, no visible pancreatic lesion.  There is a lobulated T2 bright lesion in the left hepatic lobe insistent with a sclerosed hemangioma though felt somewhat atypical and 3 to 72-monthfollow-up MRI recommended.  She  feels about the same today, says the pain medication is helping, she is tolerating clear liquids but says she has increasing pain with p.o. intake.  Noted dark urine about 2 days prior to admission    Past Medical History:  Diagnosis Date  . Asthma   . [place]   . Diverticulosis   . Gestational hypertension   . HA (headache)   . History of borderline personality disorder   . Hypersomnia, persistent 04/29/2013  . IBS (irritable bowel syndrome)   . Memory loss, short term    dr sJanann Colonel8-8-14   . Migraine   . Reflux esophagitis   . Seasonal allergies     Past Surgical History:  Procedure Laterality Date  . ABDOMINAL HYSTERECTOMY  00  . COLONOSCOPY  01/2020  . KNEE SURGERY Right 96,98,00,02,05,2014  . NASAL SINUS SURGERY    . NOSE SURGERY     [id number] . WRIST SURGERY Right [phone]   Prior to Admission medications   Medication Sig Start Date End Date Taking? Authorizing Provider  azelastine (ASTELIN) 0.1 % nasal spray Place 2 sprays into both nostrils 2 (two) times daily. Patient taking differently: Place 2 sprays into both nostrils 2 (two) times daily as needed for allergies. 07/05/20  Yes PVivi Barrack MD  budesonide (PULMICORT) 0.5 MG/2ML nebulizer solution Take 0.5 mg by nebulization daily as needed (sob/wheezing). 10/20/20  Yes [provider]  cetirizine (ZYRTEC) 10 MG tablet Take 10 mg by mouth daily.   Yes [provider]  cyclobenzaprine (FLEXERIL) 5 MG tablet TAKE 1 TABLET(5 MG) BY MOUTH AT BEDTIME AS NEEDED FOR LOWER BACK PAIN OR SEVERE HEADACHE 08/29/20  Yes PNarda Amber  K, DO  diclofenac (VOLTAREN) 50 MG EC tablet Take 1 tablet (50 mg total) by mouth every 8 (eight) hours as needed (with compazine and diphenhydramine as needed for migrianes). 06/13/20  Yes [name], MD  dicyclomine (BENTYL) 10 MG capsule Take 1 capsule (10 mg total) by mouth in the morning and at bedtime. 03/15/20  Yes Noralyn Pick, NP   EPINEPHrine 0.3 mg/0.3 mL IJ SOAJ injection Inject 0.3 mg into the muscle as needed for anaphylaxis. INJECT 0.3 MLS (0.3 MG TOTAL) INTO THE MUSCLE ONCE 12/14/20  Yes [name], MD  fluticasone [facility]) 50 MCG/ACT nasal spray Place 1 spray into both nostrils in the morning and at bedtime.   Yes [provider]  gabapentin (NEURONTIN) 300 MG capsule Take 300 mg by mouth daily as needed (pain). 12/13/20  Yes [provider]  HYDROcodone-acetaminophen (NORCO/VICODIN) 5-325 MG tablet Take 1 tablet by mouth 2 (two) times daily as needed for moderate pain or severe pain. 12/21/20  Yes [provider]  lamoTRIgine (LAMICTAL) 100 MG tablet TAKE 1 TABLET BY MOUTH EVERY DAY Patient taking differently: Take 100 mg by mouth daily as needed (Bipolar disorder). 10/10/17  Yes Dara Hoyer, PA-C  LATUDA 40 MG TABS tablet Take 1 tablet (40 mg total) by mouth daily with breakfast. 10/10/17  Yes Dara Hoyer, PA-C  MOBIC 15 MG tablet Take 1 tablet by mouth daily. 11/14/20  Yes [provider]  ondansetron (ZOFRAN ODT) 8 MG disintegrating tablet Take 1 tablet (8 mg total) by mouth every 8 (eight) hours as needed for nausea or vomiting. 02/08/15  Yes Linton Flemings, MD  prochlorperazine (COMPAZINE) 10 MG tablet TAKE 1 TABLET BY MOUTH EVERY 8 HOURS AS NEEDED( ALONG WITH DICLOFENAC AND DIPHENHYDRAMINE FOR MIGRAINES) 06/27/20  Yes [name], MD  valACYclovir (VALTREX) 1000 MG tablet Take 1,000 mg by mouth daily as needed (shingles). 10/20/19  Yes [provider]  azithromycin (ZITHROMAX) 250 MG tablet Take 2 tabs day 1, then 1 tab daily 07/05/20   [name], MD  clobetasol cream (TEMOVATE) 2.35 % Apply 1 application topically 2 (two) times daily. Patient not taking: Reported on 01/11/2021 10/20/19   [name], MD  cycloSPORINE (RESTASIS) 0.05 % ophthalmic emulsion INSTILL 1 DROP IN [facility] EYE TWICE DAILY Patient not taking: Reported on 01/11/2021 11/23/19    [provider]  predniSONE (DELTASONE) 50 MG tablet Take 1 tablet daily for 5 days. 07/25/20   [name], MD  QUEtiapine (SEROQUEL XR) 200 MG 24 hr tablet Take 1 tablet (200 mg total) by mouth at bedtime. Patient not taking: Reported on 01/11/2021 10/10/17   Dara Hoyer, PA-C    Current Facility-Administered Medications  Medication Dose Route Frequency Provider Last Rate Last Admin  . 0.9 %  sodium chloride infusion   Intravenous Continuous Fredia Sorrow, MD 100 mL/hr at 01/11/21 2335 Restarted at 01/11/21 2335  . albuterol (PROVENTIL) (2.5 MG/3ML) 0.083% nebulizer solution 2.5 mg  2.5 mg Nebulization Q6H PRN Hugelmeyer, Alexis, DO      . azelastine (ASTELIN) 0.1 % nasal spray 2 spray  2 spray Each Nare BID PRN Hugelmeyer, Alexis, DO      . bisacodyl (DULCOLAX) EC tablet 5 mg  5 mg Oral Daily PRN Hugelmeyer, Alexis, DO      . budesonide (PULMICORT) nebulizer solution 0.5 mg  0.5 mg Nebulization Daily PRN Hugelmeyer, Alexis, DO      . feeding supplement (BOOST / RESOURCE BREEZE)  liquid 1 Container  1 Container Oral TID BM Elodia Florence., MD      . fluticasone [facility]) 50 MCG/ACT nasal spray 1 spray  1 spray Each Nare Daily Hugelmeyer, Alexis, DO   1 spray at 01/12/21 1016  . gabapentin (NEURONTIN) capsule 300 mg  300 mg Oral Daily PRN Hugelmeyer, Alexis, DO      . ipratropium (ATROVENT) nebulizer solution 0.5 mg  0.5 mg Nebulization Q6H PRN Hugelmeyer, Alexis, DO      . lamoTRIgine (LAMICTAL) tablet 100 mg  100 mg Oral Daily Hugelmeyer, Alexis, DO      . loratadine (CLARITIN) tablet 10 mg  10 mg Oral Daily Hugelmeyer, Alexis, DO   10 mg at 01/12/21 1016  . lurasidone (LATUDA) tablet 40 mg  40 mg Oral Q breakfast Hugelmeyer, Alexis, DO   40 mg at 01/12/21 1237  . magnesium citrate solution 1 Bottle  1 Bottle Oral Once PRN Hugelmeyer, Alexis, DO      . morphine 2 MG/ML injection 1 mg  1 mg Intravenous Q6H PRN Hugelmeyer, Alexis, DO   1 mg at 01/12/21 1357  .  ondansetron (ZOFRAN) tablet 4 mg  4 mg Oral Q6H PRN Hugelmeyer, Alexis, DO       Or  . ondansetron (ZOFRAN) injection 4 mg  4 mg Intravenous Q6H PRN Hugelmeyer, Alexis, DO      . pantoprazole (PROTONIX) injection 40 mg  40 mg Intravenous Q24H Hugelmeyer, Alexis, DO   40 mg at 01/11/21 2337  . senna-docusate (Senokot-S) tablet 1 tablet  1 tablet Oral QHS PRN Hugelmeyer, Alexis, DO      . traMADol (ULTRAM) tablet 50 mg  50 mg Oral Q6H PRN Hugelmeyer, Alexis, DO   50 mg at 01/12/21 0100  . traZODone (DESYREL) tablet 25 mg  25 mg Oral QHS PRN Hugelmeyer, Alexis, DO   25 mg at 01/11/21 2148    Allergies as of 01/11/2021 - Review Complete 01/11/2021  Allergen Reaction Noted  . Aspirin Nausea And Vomiting and Rash 04/02/2013  . Bee venom Anaphylaxis 11/23/2014  . Penicillins Anaphylaxis 04/02/2013  . Bactrim [sulfamethoxazole-trimethoprim] Nausea And Vomiting 04/02/2013  . Sulfa antibiotics Nausea And Vomiting 11/23/2014    Family History  Problem Relation Age of Onset  . Depression Mother   . High Cholesterol Mother   . Hypertension Mother   . Heart failure Mother   . Hypertension Father   . High blood pressure Maternal Grandfather   . Heart attack Maternal Grandfather   . Breast cancer Maternal Grandmother   . Diabetes Maternal Grandmother   . High blood pressure Maternal Grandmother   . Heart disease Maternal Grandmother   . Breast cancer Sister   . Schizophrenia Brother   . Colon cancer Neg Hx   . Esophageal cancer Neg Hx   . Rectal cancer Neg Hx   . Stomach cancer Neg Hx     Social History   Socioeconomic History  . Marital status: Married    Spouse name: [name]  . Number of children: 1  . Years of education: AA  . Highest education level: Not on file  Occupational History    Employer: [employer]  Tobacco Use  . Smoking status: Never Smoker  . Smokeless tobacco: Never Used  Vaping Use  . Vaping Use: Never used   Substance and Sexual Activity  . Alcohol use: No    Comment: 2-3 drinks per year  . Drug use: No  .  Sexual activity: Not on file  Other Topics Concern  . Not on file  Social History Narrative   Pneumonia vaccine 2005   Patient lives at home with wife and 2 sons.    Patient works in [employer].    Patient has her AA   Right handed   One story home       Review of Systems: Pertinent positive and negative review of systems were noted in the above HPI section.  All other review of systems was otherwise negative.  Physical Exam: Vital signs in last 24 hours: Temp:  [98.4 F (36.9 C)-99 F (37.2 C)] 98.4 F (36.9 C) (05/19 1418) Pulse Rate:  [68-79] 79 (05/19 1418) Resp:  [17-19] 17 (05/19 1418) BP: (132-151)/(74-83) 148/83 (05/19 1418) SpO2:  [97 %-100 %] 98 % (05/19 1418) Weight:  [97.6 kg] 97.6 kg (05/19 1051) Last BM Date: 01/09/21 General:   Alert,  Well-developed, well-nourished, African-American female, pleasant and cooperative in NAD Head:  Normocephalic and atraumatic. Eyes:  Sclera icteric.   Conjunctiva pink. Ears:  Normal auditory acuity. Nose:  No deformity, discharge,  or lesions. Mouth:  No deformity or lesions.   Neck:  Supple; no masses or thyromegaly. Lungs:  Clear throughout to auscultation.   No wheezes, crackles, or rhonchi. Heart:  Regular rate and rhythm; no murmurs, clicks, rubs,  or gallops. Abdomen:  Soft, there is tenderness in the epigastrium no guarding, BS active,nonpalp mass or hsm.   Rectal: Not done Msk:  Symmetrical without gross deformities. . Pulses:  Normal pulses noted. Extremities:  Without clubbing or edema. Neurologic:  Alert and  oriented x4;  grossly normal neurologically. Skin:  Intact without significant lesions or rashes.. Psych:  Alert and cooperative. Normal mood and affect.  Intake/Output from previous day: 05/18 0701 - 05/19 0700 In: 1971 [I.V.:968.7; IV Piggyback:1002.2] Out: -   Lab Results: Recent Labs     01/11/21 1122 01/12/21 0115  WBC 6.9 7.4  HGB 13.6 12.8  HCT 43.0 42.0  PLT 217 198   BMET Recent Labs    01/11/21 1122 01/12/21 0115  NA 139 139  K 3.4* 4.3  CL 103 111  CO2 26 19*  GLUCOSE 105* 104*  BUN 10 8  CREATININE 1.11* 0.73  CALCIUM 9.6 9.0   LFT Recent Labs    01/12/21 0115  PROT 6.3*  ALBUMIN 3.4*  AST 243*  ALT 500*  ALKPHOS 92  BILITOT 6.7*   PT/INR Recent Labs    01/11/21 1722  LABPROT 12.9  INR 1.0   Hepatitis Panel Recent Labs    01/11/21 1722  HEPBSAG NON REACTIVE  HCVAB NON REACTIVE  HEPAIGM NON REACTIVE  HEPBIGM NON REACTIVE      IMPRESSION:  #80 55 year old African-American female with 3-4 day history of persistent epigastric pain, mild nausea and increase in pain postprandially.  Work-up since admission shows elevated LFTs with T bili of 6.7. CT then MRI/MRCP revealing a markedly dilated common bile duct of about 2 cm, there are some abrupt caliber change in the pancreatic portion of the common bile duct but no definite mass stricture or stone or Gallbladder distended, no visible pancreatic lesion on MRI but CT scan had suggested soft tissue prominence in the pancreatic head. 2.9 cm lobulated lesion in the left lobe of the liver on MRI felt consistent with a somewhat atypical sclerosed hemangioma and recommended 3 to 63-monthfollow-up.  Abdominal ultrasound done and pending, no stones noted on CT or MRI  Rule out  nonvisualized CBD stone, rule out CBD stricture/neoplasm, rule out small pancreatic head neoplasm  Biliary obstruction and liver lesion  #2 asthma 3.  Anxiety 4.  Bipolar disorder, borderline personality 5.  Sleep apnea 6.  IBS 7.  Diverticulosis  Plan; continue clear liquids Antiemetics and analgesics as needed Repeat labs in a.m. Follow-up ultrasound result Patient will be scheduled for ERCP with possible stone extraction/stent placement/biopsy per Dr. Carlean Purl.  Procedure was discussed in detail with the  patient and her spouse (per phone) including indications risks and benefits.  We discussed 4 to 5% risk of post ERCP pancreatitis, rare risk of bleeding and/or perforation or procedure failure. She is agreeable to proceed. Timing of procedure to be determined, hopefully can be worked into schedule tomorrow afternoon.  Next, we will follow with you     Royetta Crochet  01/12/2021, 4:17 PM  Gastroenterology attending:  I have seen and evaluated the patient as well.  Images reviewed.  She has some sort of biliary obstruction question benign stricture, question occult tumor either cholangio or pancreatic or ampullary would be most likely or perhaps an occult CBD stone.  Hoping to get time to do ERCP tomorrow if unable we will do that Saturday most likely.  The risks and benefits as well as alternatives of endoscopic procedure(s) have been discussed and reviewed. All questions answered. The patient agrees to proceed.   Gatha Mayer, MD, [facility] 01/12/2021 4:17 PM

## 2021-01-12 NOTE — Progress Notes (Signed)
Initial Nutrition Assessment  DOCUMENTATION CODES:   Obesity unspecified  INTERVENTION:   Once diet advanced: -Boost Breeze po TID, each supplement provides 250 kcal and 9 grams of protein  NUTRITION DIAGNOSIS:   Inadequate oral intake related to poor appetite,acute illness as evidenced by per patient/family report.  GOAL:   Patient will meet greater than or equal to 90% of their needs  MONITOR:   Diet advancement,Labs,Weight trends,I & O's  REASON FOR ASSESSMENT:   Malnutrition Screening Tool    ASSESSMENT:   55 y.o. female with a known history of asthma,, anxiety and depression bipolar disorder, borderline personality disorder, IBS, reflux, obstructive sleep apnea presents to the emergency department for evaluation of abdominal pain.  Patient reports three days history of symptoms consistent with prior gastroesophageal reflux disease causing upper abdominal discomfort which is worse at night, worse with food and has caused decreased appetite and decreased p.o. intake, constipation.  Patient currently NPO, had a MRI this morning and abdominal ultrasound. Pt reports abdominal pain and constipation for 3 days which impacted appetite and PO intakes.  Once diet advanced, will order Boost Breeze.  Per weight records, pt has lost 9 lbs since August 2021, insignificant weight loss for time frame.   Medications: KLOR-CON  Labs reviewed.  NUTRITION - FOCUSED PHYSICAL EXAM:  No depletions.  Diet Order:   Diet Order            Diet NPO time specified Except for: Ice Chips, Sips with Meds  Diet effective midnight                 EDUCATION NEEDS:   No education needs have been identified at this time  Skin:  Skin Assessment: Reviewed RN Assessment  Last BM:  5/16  Height:   Ht Readings from Last 1 Encounters:  06/13/20 5\' 9"  (1.753 m)    Weight:   Wt Readings from Last 1 Encounters:  01/12/21 97.6 kg    BMI:  Body mass index is 31.78 kg/m.  Estimated  Nutritional Needs:   Kcal:  1800-2000  Protein:  80-95g  Fluid:  2L/day  Clayton Bibles, MS, RD, LDN Inpatient Clinical Dietitian Contact information available via Amion

## 2021-01-12 NOTE — Progress Notes (Addendum)
Patient ok to go down for MRI/MRCP without monitoring per Opyd, MD.

## 2021-01-13 ENCOUNTER — Inpatient Hospital Stay (HOSPITAL_COMMUNITY): Payer: 59 | Admitting: Anesthesiology

## 2021-01-13 ENCOUNTER — Encounter (HOSPITAL_COMMUNITY)
Admission: EM | Disposition: A | Discharge status: Home / Self Care | Payer: Self-pay | Source: Home / Self Care | Attending: Family Medicine

## 2021-01-13 ENCOUNTER — Inpatient Hospital Stay (HOSPITAL_COMMUNITY): Payer: 59

## 2021-01-13 ENCOUNTER — Encounter (HOSPITAL_COMMUNITY): Payer: Self-pay | Admitting: Family Medicine

## 2021-01-13 DIAGNOSIS — K831 Obstruction of bile duct: Secondary | ICD-10-CM

## 2021-01-13 HISTORY — PX: BILIARY STENT PLACEMENT: SHX5538

## 2021-01-13 HISTORY — PX: ERCP: SHX5425

## 2021-01-13 HISTORY — PX: BILIARY BRUSHING: SHX6843

## 2021-01-13 HISTORY — PX: SPHINCTEROTOMY: SHX5544

## 2021-01-13 HISTORY — PX: BIOPSY: SHX5522

## 2021-01-13 LAB — COMPREHENSIVE METABOLIC PANEL
ALT: 482 U/L — ABNORMAL HIGH (ref 0–44)
AST: 221 U/L — ABNORMAL HIGH (ref 15–41)
Albumin: 3.4 g/dL — ABNORMAL LOW (ref 3.5–5.0)
Alkaline Phosphatase: 99 U/L (ref 38–126)
Anion gap: 4 — ABNORMAL LOW (ref 5–15)
BUN: 8 mg/dL (ref 6–20)
CO2: 26 mmol/L (ref 22–32)
Calcium: 9.1 mg/dL (ref 8.9–10.3)
Chloride: 109 mmol/L (ref 98–111)
Creatinine, Ser: 0.83 mg/dL (ref 0.44–1.00)
GFR, Estimated: >60 mL/min (ref >60)
Glucose, Bld: 105 mg/dL — ABNORMAL HIGH (ref 70–99)
Potassium: 4.6 mmol/L (ref 3.5–5.1)
Sodium: 139 mmol/L (ref 135–145)
Total Bilirubin: 7.5 mg/dL — ABNORMAL HIGH (ref 0.3–1.2)
Total Protein: 6.7 g/dL (ref 6.5–8.1)

## 2021-01-13 LAB — CBC WITH DIFFERENTIAL/PLATELET
Abs Immature Granulocytes: 0.02 10*3/uL (ref 0.00–0.07)
Basophils Absolute: 0.1 10*3/uL (ref 0.0–0.1)
Basophils Relative: 1 %
Eosinophils Absolute: 0.1 10*3/uL (ref 0.0–0.5)
Eosinophils Relative: 2 %
HCT: 39.6 % (ref 36.0–46.0)
Hemoglobin: 12.3 g/dL (ref 12.0–15.0)
Immature Granulocytes: 0 %
Lymphocytes Relative: 30 %
Lymphs Abs: 2 10*3/uL (ref 0.7–4.0)
MCH: 27.5 pg (ref 26.0–34.0)
MCHC: 31.1 g/dL (ref 30.0–36.0)
MCV: 88.4 fL (ref 80.0–100.0)
Monocytes Absolute: 0.7 10*3/uL (ref 0.1–1.0)
Monocytes Relative: 11 %
Neutro Abs: 3.7 10*3/uL (ref 1.7–7.7)
Neutrophils Relative %: 56 %
Platelets: 193 10*3/uL (ref 150–400)
RBC: 4.48 MIL/uL (ref 3.87–5.11)
RDW: 14.3 % (ref 11.5–15.5)
WBC: 6.6 10*3/uL (ref 4.0–10.5)
nRBC: 0 % (ref 0.0–0.2)

## 2021-01-13 SURGERY — ERCP, WITH INTERVENTION IF INDICATED
Anesthesia: General

## 2021-01-13 MED ORDER — MIDAZOLAM HCL 5 MG/5ML IJ SOLN
INTRAMUSCULAR | Status: DC | PRN
  Administered 2021-01-13: 2 mg via INTRAVENOUS

## 2021-01-13 MED ORDER — GLUCAGON HCL RDNA (DIAGNOSTIC) 1 MG IJ SOLR
INTRAMUSCULAR | Status: DC | PRN
  Administered 2021-01-13: .5 mL via INTRAVENOUS

## 2021-01-13 MED ORDER — GLUCAGON HCL RDNA (DIAGNOSTIC) 1 MG IJ SOLR
INTRAMUSCULAR | Status: AC
  Filled 2021-01-13: qty 1

## 2021-01-13 MED ORDER — LACTATED RINGERS IV SOLN: INTRAVENOUS | Status: DC | PRN

## 2021-01-13 MED ORDER — FENTANYL CITRATE (PF) 100 MCG/2ML IJ SOLN
INTRAMUSCULAR | Status: AC
  Filled 2021-01-13: qty 2

## 2021-01-13 MED ORDER — CIPROFLOXACIN IN D5W 400 MG/200ML IV SOLN
INTRAVENOUS | Status: DC | PRN
  Administered 2021-01-13: 400 mg via INTRAVENOUS

## 2021-01-13 MED ORDER — INDOMETHACIN 50 MG RE SUPP
RECTAL | Status: AC
  Filled 2021-01-13: qty 2

## 2021-01-13 MED ORDER — LIDOCAINE 2% (20 MG/ML) 5 ML SYRINGE
INTRAMUSCULAR | Status: DC | PRN
  Administered 2021-01-13: 60 mg via INTRAVENOUS

## 2021-01-13 MED ORDER — DEXAMETHASONE SODIUM PHOSPHATE 10 MG/ML IJ SOLN
INTRAMUSCULAR | Status: DC | PRN
  Administered 2021-01-13: 10 mg via INTRAVENOUS

## 2021-01-13 MED ORDER — PROPOFOL 10 MG/ML IV BOLUS
INTRAVENOUS | Status: AC
  Filled 2021-01-13: qty 20

## 2021-01-13 MED ORDER — INDOMETHACIN 50 MG RE SUPP
RECTAL | Status: AC
  Filled 2021-01-13: qty 1

## 2021-01-13 MED ORDER — MIDAZOLAM HCL 2 MG/2ML IJ SOLN
INTRAMUSCULAR | Status: AC
  Filled 2021-01-13: qty 2

## 2021-01-13 MED ORDER — SUGAMMADEX SODIUM 200 MG/2ML IV SOLN
INTRAVENOUS | Status: DC | PRN
  Administered 2021-01-13: 200 mg via INTRAVENOUS

## 2021-01-13 MED ORDER — PROPOFOL 10 MG/ML IV BOLUS
INTRAVENOUS | Status: DC | PRN
  Administered 2021-01-13: 150 mg via INTRAVENOUS

## 2021-01-13 MED ORDER — INDOMETHACIN 50 MG RE SUPP
RECTAL | Status: DC | PRN
  Administered 2021-01-13: 100 mg via RECTAL

## 2021-01-13 MED ORDER — FENTANYL CITRATE (PF) 100 MCG/2ML IJ SOLN
INTRAMUSCULAR | Status: DC | PRN
  Administered 2021-01-13 (×2): 100 ug via INTRAVENOUS

## 2021-01-13 MED ORDER — ROCURONIUM BROMIDE 10 MG/ML (PF) SYRINGE
PREFILLED_SYRINGE | INTRAVENOUS | Status: DC | PRN
  Administered 2021-01-13: 40 mg via INTRAVENOUS

## 2021-01-13 MED ORDER — CIPROFLOXACIN IN D5W 400 MG/200ML IV SOLN
INTRAVENOUS | Status: AC
  Filled 2021-01-13: qty 200

## 2021-01-13 MED ORDER — LACTATED RINGERS IV SOLN: Freq: Once | INTRAVENOUS | Status: AC

## 2021-01-13 NOTE — Plan of Care (Signed)
Pt was able to sleep comfortably enough last night with help from pain management RX/ pain located on the RUQ from abdomen to shoulder. Pt is independent and report no s/s of acute distress. Call light is within reach and bed is at lowest position for safety.

## 2021-01-13 NOTE — Anesthesia Procedure Notes (Signed)
Procedure Name: Intubation Date/Time: 01/13/2021 4:07 PM Performed by: Gerald Leitz, CRNA Pre-anesthesia Checklist: Patient identified, Patient being monitored, Timeout performed, Emergency Drugs available and Suction available Patient Re-evaluated:Patient Re-evaluated prior to induction Oxygen Delivery Method: Circle system utilized Preoxygenation: Pre-oxygenation with 100% oxygen Induction Type: IV induction Ventilation: Mask ventilation without difficulty Laryngoscope Size: Mac and 3 Grade View: Grade I Tube type: Oral Tube size: 7.0 mm Number of attempts: 1 Placement Confirmation: ETT inserted through vocal cords under direct vision,  positive ETCO2 and breath sounds checked- equal and bilateral Secured at: 21 cm Tube secured with: Tape Dental Injury: Teeth and Oropharynx as per pre-operative assessment

## 2021-01-13 NOTE — Progress Notes (Signed)
Patient ID: Robin [name], female   DOB: 24-Jun-1966, 55 y.o.   MRN: [id number]    Progress Note   Subjective   day # 3 CC; abdominal pain, nausea  Abdominal ultrasound confirmed no gallstones or gallbladder wall thickening, CBD 13.6 mm, 2 prominent hyperechoic lesions in the right hepatic lobe hemangiomas versus other  Labs today WBC 6.6, hemoglobin 12.3 T bili 7.5/alk phos 99/AST 221/ALT 482  Patient says her pain is about the same, constant and worse with p.o. intake    Objective   Vital signs in last 24 hours: Temp:  [97.7 F (36.5 C)-98.4 F (36.9 C)] 97.7 F (36.5 C) (05/20 0541) Pulse Rate:  [62-79] 62 (05/20 0541) Resp:  [17-18] 17 (05/20 0541) BP: (138-148)/(73-87) 143/87 (05/20 0541) SpO2:  [95 %-98 %] 95 % (05/20 0541) Weight:  [97.6 kg] 97.6 kg (05/19 1051) Last BM Date: 01/09/21 General:    African-American female in NAD, scleral icterus Heart:  Regular rate and rhythm; no murmurs Lungs: Respirations even and unlabored, lungs CTA bilaterally Abdomen:  Soft, tenderness in the epigastrium no rebound. and nondistended. Normal bowel sounds. Extremities:  Without edema. Neurologic:  Alert and oriented,  grossly normal neurologically. Psych:  Cooperative. Normal mood and affect.  Intake/Output from previous day: 05/19 0701 - 05/20 0700 In: 600 [P.O.:600] Out: -  Intake/Output this shift: No intake/output data recorded.  Lab Results: Recent Labs    01/11/21 1122 01/12/21 0115 01/13/21 0521  WBC 6.9 7.4 6.6  HGB 13.6 12.8 12.3  HCT 43.0 42.0 39.6  PLT 217 198 193   BMET Recent Labs    01/11/21 1122 01/12/21 0115 01/13/21 0521  NA 139 139 139  K 3.4* 4.3 4.6  CL 103 111 109  CO2 26 19* 26  GLUCOSE 105* 104* 105*  BUN 10 8 8   CREATININE 1.11* 0.73 0.83  CALCIUM 9.6 9.0 9.1   LFT Recent Labs    01/13/21 0521  PROT 6.7  ALBUMIN 3.4*  AST 221*  ALT 482*  ALKPHOS 99  BILITOT 7.5*   PT/INR Recent Labs    01/11/21 1722  LABPROT 12.9   INR 1.0    Studies/Results: CT Abdomen Pelvis Wo Contrast  Result Date: 01/11/2021 CLINICAL DATA:  Epigastric and left upper quadrant abdominal pain for 1 week. Decreased appetite. EXAM: CT ABDOMEN AND PELVIS WITHOUT CONTRAST TECHNIQUE: Multidetector CT imaging of the abdomen and pelvis was performed following the standard protocol without IV contrast. COMPARISON:  None. FINDINGS: Lower chest: No acute findings. Hepatobiliary: 2.9 cm low-attenuation mass is seen in segment 4A which cannot be characterized on this unenhanced exam. Several other tiny sub-cm low-attenuation lesions are also seen in the right and left lobes which may represent tiny cysts but cannot be definitively characterized without IV contrast. Gallbladder is unremarkable. Diffuse biliary ductal dilatation is seen with common bile duct measuring approximately 2 cm in diameter. Pancreas: Soft tissue prominence is noted in the region of the pancreatic head, and pancreatic mass cannot be excluded on this unenhanced exam. No evidence of pancreatic ductal dilatation or peripancreatic inflammatory changes. Spleen:  Within normal limits in size. Adrenals/Urinary tract: No evidence of urolithiasis or hydronephrosis. Unremarkable unopacified urinary bladder. Stomach/Bowel: No evidence of obstruction, inflammatory process, or abnormal fluid collections. Normal appendix visualized. Right-sided colonic diverticulosis is noted, however there is no evidence of diverticulitis. Vascular/Lymphatic: No pathologically enlarged lymph nodes identified. No evidence of abdominal aortic aneurysm. Reproductive: Prior hysterectomy noted. Adnexal regions are unremarkable in appearance. Other:  None. Musculoskeletal:  No suspicious bone lesions identified. IMPRESSION: Multiple low-attenuation liver lesions, which cannot be characterized on this unenhanced exam. Abdomen MRI without and with contrast is recommended for further characterization. Diffuse biliary ductal  dilatation, with soft tissue prominence in the region of the pancreatic head. Pancreatic mass cannot be excluded on this unenhanced exam. Recommend abdomen MRI and MRCP without and with contrast for further evaluation. Colonic diverticulosis, without radiographic evidence of diverticulitis. Electronically Signed   By: Marlaine Hind M.D.   On: 01/11/2021 16:05   MR 3D Recon At Scanner  Result Date: 01/12/2021 CLINICAL DATA:  Abdominal pain, suspected biliary obstruction in a 55 year old female. EXAM: MRI ABDOMEN WITHOUT AND WITH CONTRAST (INCLUDING MRCP) TECHNIQUE: Multiplanar multisequence MR imaging of the abdomen was performed both before and after the administration of intravenous contrast. Heavily T2-weighted images of the biliary and pancreatic ducts were obtained, and three-dimensional MRCP images were rendered by post processing. CONTRAST:  38m GADAVIST GADOBUTROL 1 MMOL/ML IV SOLN COMPARISON:  CT Jan 11, 2021. FINDINGS: Lower chest: No effusion. No consolidation. Limited assessment of the lung bases on MRI. Hepatobiliary: Marked biliary duct dilation with profound extrahepatic biliary duct distension and moderate intrahepatic biliary duct distension. Abrupt caliber change in the pancreatic portion of the common bile duct. No filling defect in the common bile duct or in the biliary tree. No pancreatic ductal dilation. Hepatic cyst in the LEFT hepatic lobe. Gallbladder is moderately distended without pericholecystic stranding or cholelithiasis. Sludge in the lumen of gallbladder. Lobulated lesion in the LEFT hepatic lobe, medial segment measuring 2.8 x 2.5 cm some peripheral enhancement and some enhancement that is slightly more central on earlier phase is than would be expected for a classic hemangioma. Fading pattern seen on diffusion. Marked T2 hyperintensity and well-circumscribed margins. Small cysts elsewhere in the liver. Pancreas: No peripancreatic stranding. Normal intrinsic T1 signal. No  substantial ductal dilation or visible lesion at the site of common duct transition. Spleen:  Normal size and contour. Adrenals/Urinary Tract: Normal adrenal glands. Symmetric renal enhancement. No hydronephrosis. No perinephric stranding. Stomach/Bowel: Gastrointestinal tract is unremarkable to the extent evaluated on abdominal MRI. Vascular/Lymphatic: Abdominal aorta is patent without dilation. There is no gastrohepatic or hepatoduodenal ligament lymphadenopathy. No retroperitoneal or mesenteric lymphadenopathy. Other:  No ascites. Musculoskeletal: No suspicious bone lesions identified. IMPRESSION: Biliary duct distension of both intra and extrahepatic biliary tree with abrupt caliber change but with no visible mass on imaging. Caliber change more abrupt than expected for stricture and the biliary tree was top-normal size at 6 mm in 2017. Endoscopic assessment is suggested to exclude an occult biliary lesion or atypical stricture. Lobulated T2 bright lesion in the medial segment of the LEFT hepatic lobe that favors a sclerosed hemangioma. There is some internal septation suggested and the filling pattern is slightly atypical. Consider 3 to six-month follow-up with extended delays following contrast to 7 minutes better assess filling characteristics, differential consideration would include a biliary cystadenoma with septal enhancement. Electronically Signed   By: GZetta BillsM.D.   On: 01/12/2021 11:40   [place]domen Limited  Result Date: 01/12/2021 CLINICAL DATA:  Elevated LFTs. EXAM: ULTRASOUND ABDOMEN LIMITED RIGHT UPPER QUADRANT COMPARISON:  MRI 01/12/2021.  CT 01/11/2021. FINDINGS: Gallbladder: No gallstones or wall thickening visualized. No sonographic Murphy sign noted by sonographer. Common bile duct: Diameter: 13.6 mm. Mild intrahepatic biliary ductal dilatation also noted. Liver: A 3.4 cm hyperechoic lesions in the right hepatic lobe. 3.9 cm hyperechoic lesions noted the right middle lobe. Although  these  could represent hemangiomas, metastatic disease cannot be excluded. Portal vein is patent on color Doppler imaging with normal direction of blood flow towards the liver. Other: None. IMPRESSION: 1. No gallstones. Prominent distention of the common bile duct. Mild intrahepatic biliary ductal distention. Reference is made to MRI report of 01/12/2021. 2. 2 prominent hyperechoic lesions noted the right hepatic lobe. Although these could represent hemangiomas, metastatic disease cannot be excluded. Electronically Signed   By: [place]   On: 01/12/2021 14:30   DG Chest Port 1 View  Result Date: 01/11/2021 CLINICAL DATA:  Chest pain. EXAM: PORTABLE CHEST 1 VIEW COMPARISON:  03/13/2016. FINDINGS: The heart size and mediastinal contours are within normal limits. Both lungs are clear. No visible pleural effusions or pneumothorax. No acute osseous abnormality. IMPRESSION: No active disease. Electronically Signed   By: Margaretha Sheffield MD   On: 01/11/2021 13:19   MR ABDOMEN MRCP W WO CONTAST  Result Date: 01/12/2021 CLINICAL DATA:  Abdominal pain, suspected biliary obstruction in a 55 year old female. EXAM: MRI ABDOMEN WITHOUT AND WITH CONTRAST (INCLUDING MRCP) TECHNIQUE: Multiplanar multisequence MR imaging of the abdomen was performed both before and after the administration of intravenous contrast. Heavily T2-weighted images of the biliary and pancreatic ducts were obtained, and three-dimensional MRCP images were rendered by post processing. CONTRAST:  58m GADAVIST GADOBUTROL 1 MMOL/ML IV SOLN COMPARISON:  CT Jan 11, 2021. FINDINGS: Lower chest: No effusion. No consolidation. Limited assessment of the lung bases on MRI. Hepatobiliary: Marked biliary duct dilation with profound extrahepatic biliary duct distension and moderate intrahepatic biliary duct distension. Abrupt caliber change in the pancreatic portion of the common bile duct. No filling defect in the common bile duct or in the biliary tree.  No pancreatic ductal dilation. Hepatic cyst in the LEFT hepatic lobe. Gallbladder is moderately distended without pericholecystic stranding or cholelithiasis. Sludge in the lumen of gallbladder. Lobulated lesion in the LEFT hepatic lobe, medial segment measuring 2.8 x 2.5 cm some peripheral enhancement and some enhancement that is slightly more central on earlier phase is than would be expected for a classic hemangioma. Fading pattern seen on diffusion. Marked T2 hyperintensity and well-circumscribed margins. Small cysts elsewhere in the liver. Pancreas: No peripancreatic stranding. Normal intrinsic T1 signal. No substantial ductal dilation or visible lesion at the site of common duct transition. Spleen:  Normal size and contour. Adrenals/Urinary Tract: Normal adrenal glands. Symmetric renal enhancement. No hydronephrosis. No perinephric stranding. Stomach/Bowel: Gastrointestinal tract is unremarkable to the extent evaluated on abdominal MRI. Vascular/Lymphatic: Abdominal aorta is patent without dilation. There is no gastrohepatic or hepatoduodenal ligament lymphadenopathy. No retroperitoneal or mesenteric lymphadenopathy. Other:  No ascites. Musculoskeletal: No suspicious bone lesions identified. IMPRESSION: Biliary duct distension of both intra and extrahepatic biliary tree with abrupt caliber change but with no visible mass on imaging. Caliber change more abrupt than expected for stricture and the biliary tree was top-normal size at 6 mm in 2017. Endoscopic assessment is suggested to exclude an occult biliary lesion or atypical stricture. Lobulated T2 bright lesion in the medial segment of the LEFT hepatic lobe that favors a sclerosed hemangioma. There is some internal septation suggested and the filling pattern is slightly atypical. Consider 3 to six-month follow-up with extended delays following contrast to 7 minutes better assess filling characteristics, differential consideration would include a biliary  cystadenoma with septal enhancement. Electronically Signed   By: GZetta BillsM.D.   On: 01/12/2021 11:40       Assessment / Plan:    #1  55 year old African-American female with 4 to 5-day history of epigastric pain, nausea and postprandial pain New onset of jaundice  Work-up is consistent with biliary obstruction of unclear etiology, rule out benign/stone versus malignant.  She has a diffusely dilated CBD, with some abrupt caliber change in the pancreatic portion of the CBD but no definite mass or stricture noted on MRI.  Plan; patient will continue n.p.o. today Tentatively scheduled for ERCP this afternoon with Dr. Carlean Purl though this is dependent on anesthesia availability.  Patient aware that procedure may be rescheduled for tomorrow morning.  Further recommendations pending results of ERCP. If procedure cannot be done today she can go back on clear liquids and then be n.p.o. after midnight.    Active Problems:   Elevated LFTs   Hyperbilirubinemia     LOS: 2 days   Kennedey Digilio EsterwoodPA-C  01/13/2021, 8:45 AM

## 2021-01-13 NOTE — Op Note (Signed)
[facility] Patient Name: Robin [name] Procedure Date: 01/13/2021 MRN: [id number] Attending MD: Gatha Mayer , MD Date of Birth: 04-23-1966 CSN: [id number] Age: 55 Admit Type: Inpatient Procedure:                ERCP Indications:              Common bile duct stricture Providers:                Gatha Mayer, MD, Jeanella Cara, RN,                            Laverda Sorenson, Technician, Eliberto Ivory, CRNA Referring MD:              Medicines:                Indomethacin 100 mg PR, Cipro 106 mg IV Complications:            No immediate complications. Estimated Blood Loss:     Estimated blood loss: none. Procedure:                Pre-Anesthesia Assessment:                           - Prior to the procedure, a History and Physical                            was performed, and patient medications and                            allergies were reviewed. The patient's tolerance of                            previous anesthesia was also reviewed. The risks                            and benefits of the procedure and the sedation                            options and risks were discussed with the patient.                            All questions were answered, and informed consent                            was obtained. Prior Anticoagulants: The patient has                            taken no previous anticoagulant or antiplatelet                            agents. ASA Grade Assessment: II - A patient with                            mild systemic disease. After reviewing the risks  and benefits, the patient was deemed in                            satisfactory condition to undergo the procedure.                           After obtaining informed consent, the scope was                            passed under direct vision. Throughout the                            procedure, the patient's blood pressure, pulse, and                             oxygen saturations were monitored continuously. The                            ERCP [id number] was introduced through the mouth, and                            used to inject contrast into and used to cannulate                            the bile duct. The ERCP was accomplished without                            difficulty. The patient tolerated the procedure                            well. Scope In: Scope Out: Findings:      The scout film was normal. The esophagus was successfully intubated       under direct vision. The scope was advanced to a normal major papilla in       the descending duodenum without detailed examination of the pharynx,       larynx and associated structures, and upper GI tract. The upper GI tract       was grossly normal. papilla looked enlarged and slightly irregular.       cannulated several times with witre into the pancreatic duct which was       injected w/ contrast once and it looked mildly dilated. Left wire in       pancreatic duct and used a second wire to cannulate common bile duct.       Contrast injected revealing dilated CBD/CHD and intrahepatics with a       very distal tigfht CBD vs ampullary stricture. This was brushed x 2 for       cytology and a 5 cm 10 fr plastic stent was placed. I then took biopsies       of the ampulla and the procedure was terminated. there was a significant       amount of dark bile drained via stent. Impression:               - A single severe localized biliary stricture was  found in the lower third of the main bile duct. The                            stricture was indeterminate. This was aspirated and                            the fluid was sent for cytology. This stricture was                            treated with stent placement. Moderate Sedation:      Not Applicable - Patient had care per Anesthesia. Recommendation:           - Return patient to hospital ward for ongoing care.                            - She can go home tomorrow if ok - we will see her.                            Labs ordered - clears only tonight.                           I will follow-up her path and cytology and contact                            her next week with follow-up plans.                           depending upon results I think she may need an EUS.                           I called and updated her wife.                           Recommend out of work note through Wed 5/25 at                            least at [employer] Procedure Code(s):        --- Professional ---                           [id number], Endoscopic retrograde                            cholangiopancreatography (ERCP); with placement of                            endoscopic stent into biliary or pancreatic duct,                            including pre- and post-dilation and guide wire                            passage, when performed, including sphincterotomy,  when performed, each stent                           43262, 59, Endoscopic retrograde                            cholangiopancreatography (ERCP); with                            sphincterotomy/papillotomy                           43261, 59, Endoscopic retrograde                            cholangiopancreatography (ERCP); with biopsy,                            single or multiple Diagnosis Code(s):        --- Professional ---                           K83.1, Obstruction of bile duct CPT copyright 2019 [employer]. All rights reserved. The codes documented in this report are preliminary and upon coder review may  be revised to meet current compliance requirements. Gatha Mayer, MD 01/13/2021 5:18:42 PM This report has been signed electronically. Number of Addenda: 0

## 2021-01-13 NOTE — Transfer of Care (Signed)
Immediate Anesthesia Transfer of Care Note  Patient: Robin [name]  Procedure(s) Performed: Procedure(s): ENDOSCOPIC RETROGRADE CHOLANGIOPANCREATOGRAPHY (ERCP) (N/A) BILIARY BRUSHING SPHINCTEROTOMY BILIARY STENT PLACEMENT (N/A) BIOPSY  Patient Location: PACU  Anesthesia Type:General  Level of Consciousness: Alert, Awake, Oriented  Airway & Oxygen Therapy: Patient Spontanous Breathing  Post-op Assessment: Report given to RN  Post vital signs: Reviewed and stable  Last Vitals:  Vitals:   01/13/21 0541 01/13/21 1401  BP: (!) 143/87 (!) 154/62  Pulse: 62 60  Resp: 17 16  Temp: 36.5 C 36.9 C  SpO2: 70% 26%    Complications: No apparent anesthesia complications

## 2021-01-13 NOTE — H&P (View-Only) (Signed)
Patient ID: Kaylanni Ezelle, female   DOB: 06/16/1966, 55 y.o.   MRN: [id number]    Progress Note   Subjective   day # 3 CC; abdominal pain, nausea  Abdominal ultrasound confirmed no gallstones or gallbladder wall thickening, CBD 13.6 mm, 2 prominent hyperechoic lesions in the right hepatic lobe hemangiomas versus other  Labs today WBC 6.6, hemoglobin 12.3 T bili 7.5/alk phos 99/AST 221/ALT 482  Patient says her pain is about the same, constant and worse with p.o. intake    Objective   Vital signs in last 24 hours: Temp:  [97.7 F (36.5 C)-98.4 F (36.9 C)] 97.7 F (36.5 C) (05/20 0541) Pulse Rate:  [62-79] 62 (05/20 0541) Resp:  [17-18] 17 (05/20 0541) BP: (138-148)/(73-87) 143/87 (05/20 0541) SpO2:  [95 %-98 %] 95 % (05/20 0541) Weight:  [97.6 kg] 97.6 kg (05/19 1051) Last BM Date: 01/09/21 General:    African-American female in NAD, scleral icterus Heart:  Regular rate and rhythm; no murmurs Lungs: Respirations even and unlabored, lungs CTA bilaterally Abdomen:  Soft, tenderness in the epigastrium no rebound. and nondistended. Normal bowel sounds. Extremities:  Without edema. Neurologic:  Alert and oriented,  grossly normal neurologically. Psych:  Cooperative. Normal mood and affect.  Intake/Output from previous day: 05/19 0701 - 05/20 0700 In: 600 [P.O.:600] Out: -  Intake/Output this shift: No intake/output data recorded.  Lab Results: Recent Labs    01/11/21 1122 01/12/21 0115 01/13/21 0521  WBC 6.9 7.4 6.6  HGB 13.6 12.8 12.3  HCT 43.0 42.0 39.6  PLT 217 198 193   BMET Recent Labs    01/11/21 1122 01/12/21 0115 01/13/21 0521  NA 139 139 139  K 3.4* 4.3 4.6  CL 103 111 109  CO2 26 19* 26  GLUCOSE 105* 104* 105*  BUN 10 8 8   CREATININE 1.11* 0.73 0.83  CALCIUM 9.6 9.0 9.1   LFT Recent Labs    01/13/21 0521  PROT 6.7  ALBUMIN 3.4*  AST 221*  ALT 482*  ALKPHOS 99  BILITOT 7.5*   PT/INR Recent Labs    01/11/21 1722  LABPROT 12.9   INR 1.0    Studies/Results: CT Abdomen Pelvis Wo Contrast  Result Date: 01/11/2021 CLINICAL DATA:  Epigastric and left upper quadrant abdominal pain for 1 week. Decreased appetite. EXAM: CT ABDOMEN AND PELVIS WITHOUT CONTRAST TECHNIQUE: Multidetector CT imaging of the abdomen and pelvis was performed following the standard protocol without IV contrast. COMPARISON:  None. FINDINGS: Lower chest: No acute findings. Hepatobiliary: 2.9 cm low-attenuation mass is seen in segment 4A which cannot be characterized on this unenhanced exam. Several other tiny sub-cm low-attenuation lesions are also seen in the right and left lobes which may represent tiny cysts but cannot be definitively characterized without IV contrast. Gallbladder is unremarkable. Diffuse biliary ductal dilatation is seen with common bile duct measuring approximately 2 cm in diameter. Pancreas: Soft tissue prominence is noted in the region of the pancreatic head, and pancreatic mass cannot be excluded on this unenhanced exam. No evidence of pancreatic ductal dilatation or peripancreatic inflammatory changes. Spleen:  Within normal limits in size. Adrenals/Urinary tract: No evidence of urolithiasis or hydronephrosis. Unremarkable unopacified urinary bladder. Stomach/Bowel: No evidence of obstruction, inflammatory process, or abnormal fluid collections. Normal appendix visualized. Right-sided colonic diverticulosis is noted, however there is no evidence of diverticulitis. Vascular/Lymphatic: No pathologically enlarged lymph nodes identified. No evidence of abdominal aortic aneurysm. Reproductive: Prior hysterectomy noted. Adnexal regions are unremarkable in appearance. Other:  None. Musculoskeletal:  No suspicious bone lesions identified. IMPRESSION: Multiple low-attenuation liver lesions, which cannot be characterized on this unenhanced exam. Abdomen MRI without and with contrast is recommended for further characterization. Diffuse biliary ductal  dilatation, with soft tissue prominence in the region of the pancreatic head. Pancreatic mass cannot be excluded on this unenhanced exam. Recommend abdomen MRI and MRCP without and with contrast for further evaluation. Colonic diverticulosis, without radiographic evidence of diverticulitis. Electronically Signed   By: Marlaine Hind M.D.   On: 01/11/2021 16:05   MR 3D Recon At Scanner  Result Date: 01/12/2021 CLINICAL DATA:  Abdominal pain, suspected biliary obstruction in a 55 year old female. EXAM: MRI ABDOMEN WITHOUT AND WITH CONTRAST (INCLUDING MRCP) TECHNIQUE: Multiplanar multisequence MR imaging of the abdomen was performed both before and after the administration of intravenous contrast. Heavily T2-weighted images of the biliary and pancreatic ducts were obtained, and three-dimensional MRCP images were rendered by post processing. CONTRAST:  9m GADAVIST GADOBUTROL 1 MMOL/ML IV SOLN COMPARISON:  CT Jan 11, 2021. FINDINGS: Lower chest: No effusion. No consolidation. Limited assessment of the lung bases on MRI. Hepatobiliary: Marked biliary duct dilation with profound extrahepatic biliary duct distension and moderate intrahepatic biliary duct distension. Abrupt caliber change in the pancreatic portion of the common bile duct. No filling defect in the common bile duct or in the biliary tree. No pancreatic ductal dilation. Hepatic cyst in the LEFT hepatic lobe. Gallbladder is moderately distended without pericholecystic stranding or cholelithiasis. Sludge in the lumen of gallbladder. Lobulated lesion in the LEFT hepatic lobe, medial segment measuring 2.8 x 2.5 cm some peripheral enhancement and some enhancement that is slightly more central on earlier phase is than would be expected for a classic hemangioma. Fading pattern seen on diffusion. Marked T2 hyperintensity and well-circumscribed margins. Small cysts elsewhere in the liver. Pancreas: No peripancreatic stranding. Normal intrinsic T1 signal. No  substantial ductal dilation or visible lesion at the site of common duct transition. Spleen:  Normal size and contour. Adrenals/Urinary Tract: Normal adrenal glands. Symmetric renal enhancement. No hydronephrosis. No perinephric stranding. Stomach/Bowel: Gastrointestinal tract is unremarkable to the extent evaluated on abdominal MRI. Vascular/Lymphatic: Abdominal aorta is patent without dilation. There is no gastrohepatic or hepatoduodenal ligament lymphadenopathy. No retroperitoneal or mesenteric lymphadenopathy. Other:  No ascites. Musculoskeletal: No suspicious bone lesions identified. IMPRESSION: Biliary duct distension of both intra and extrahepatic biliary tree with abrupt caliber change but with no visible mass on imaging. Caliber change more abrupt than expected for stricture and the biliary tree was top-normal size at 6 mm in 2017. Endoscopic assessment is suggested to exclude an occult biliary lesion or atypical stricture. Lobulated T2 bright lesion in the medial segment of the LEFT hepatic lobe that favors a sclerosed hemangioma. There is some internal septation suggested and the filling pattern is slightly atypical. Consider 3 to six-month follow-up with extended delays following contrast to 7 minutes better assess filling characteristics, differential consideration would include a biliary cystadenoma with septal enhancement. Electronically Signed   By: GZetta BillsM.D.   On: 01/12/2021 11:40   [place]domen Limited  Result Date: 01/12/2021 CLINICAL DATA:  Elevated LFTs. EXAM: ULTRASOUND ABDOMEN LIMITED RIGHT UPPER QUADRANT COMPARISON:  MRI 01/12/2021.  CT 01/11/2021. FINDINGS: Gallbladder: No gallstones or wall thickening visualized. No sonographic Murphy sign noted by sonographer. Common bile duct: Diameter: 13.6 mm. Mild intrahepatic biliary ductal dilatation also noted. Liver: A 3.4 cm hyperechoic lesions in the right hepatic lobe. 3.9 cm hyperechoic lesions noted the right middle lobe. Although  these  could represent hemangiomas, metastatic disease cannot be excluded. Portal vein is patent on color Doppler imaging with normal direction of blood flow towards the liver. Other: None. IMPRESSION: 1. No gallstones. Prominent distention of the common bile duct. Mild intrahepatic biliary ductal distention. Reference is made to MRI report of 01/12/2021. 2. 2 prominent hyperechoic lesions noted the right hepatic lobe. Although these could represent hemangiomas, metastatic disease cannot be excluded. Electronically Signed   By: [place]   On: 01/12/2021 14:30   DG Chest Port 1 View  Result Date: 01/11/2021 CLINICAL DATA:  Chest pain. EXAM: PORTABLE CHEST 1 VIEW COMPARISON:  03/13/2016. FINDINGS: The heart size and mediastinal contours are within normal limits. Both lungs are clear. No visible pleural effusions or pneumothorax. No acute osseous abnormality. IMPRESSION: No active disease. Electronically Signed   By: Margaretha Sheffield MD   On: 01/11/2021 13:19   MR ABDOMEN MRCP W WO CONTAST  Result Date: 01/12/2021 CLINICAL DATA:  Abdominal pain, suspected biliary obstruction in a 55 year old female. EXAM: MRI ABDOMEN WITHOUT AND WITH CONTRAST (INCLUDING MRCP) TECHNIQUE: Multiplanar multisequence MR imaging of the abdomen was performed both before and after the administration of intravenous contrast. Heavily T2-weighted images of the biliary and pancreatic ducts were obtained, and three-dimensional MRCP images were rendered by post processing. CONTRAST:  41m GADAVIST GADOBUTROL 1 MMOL/ML IV SOLN COMPARISON:  CT Jan 11, 2021. FINDINGS: Lower chest: No effusion. No consolidation. Limited assessment of the lung bases on MRI. Hepatobiliary: Marked biliary duct dilation with profound extrahepatic biliary duct distension and moderate intrahepatic biliary duct distension. Abrupt caliber change in the pancreatic portion of the common bile duct. No filling defect in the common bile duct or in the biliary tree.  No pancreatic ductal dilation. Hepatic cyst in the LEFT hepatic lobe. Gallbladder is moderately distended without pericholecystic stranding or cholelithiasis. Sludge in the lumen of gallbladder. Lobulated lesion in the LEFT hepatic lobe, medial segment measuring 2.8 x 2.5 cm some peripheral enhancement and some enhancement that is slightly more central on earlier phase is than would be expected for a classic hemangioma. Fading pattern seen on diffusion. Marked T2 hyperintensity and well-circumscribed margins. Small cysts elsewhere in the liver. Pancreas: No peripancreatic stranding. Normal intrinsic T1 signal. No substantial ductal dilation or visible lesion at the site of common duct transition. Spleen:  Normal size and contour. Adrenals/Urinary Tract: Normal adrenal glands. Symmetric renal enhancement. No hydronephrosis. No perinephric stranding. Stomach/Bowel: Gastrointestinal tract is unremarkable to the extent evaluated on abdominal MRI. Vascular/Lymphatic: Abdominal aorta is patent without dilation. There is no gastrohepatic or hepatoduodenal ligament lymphadenopathy. No retroperitoneal or mesenteric lymphadenopathy. Other:  No ascites. Musculoskeletal: No suspicious bone lesions identified. IMPRESSION: Biliary duct distension of both intra and extrahepatic biliary tree with abrupt caliber change but with no visible mass on imaging. Caliber change more abrupt than expected for stricture and the biliary tree was top-normal size at 6 mm in 2017. Endoscopic assessment is suggested to exclude an occult biliary lesion or atypical stricture. Lobulated T2 bright lesion in the medial segment of the LEFT hepatic lobe that favors a sclerosed hemangioma. There is some internal septation suggested and the filling pattern is slightly atypical. Consider 3 to six-month follow-up with extended delays following contrast to 7 minutes better assess filling characteristics, differential consideration would include a biliary  cystadenoma with septal enhancement. Electronically Signed   By: GZetta BillsM.D.   On: 01/12/2021 11:40       Assessment / Plan:    #1  55 year old African-American female with 4 to 5-day history of epigastric pain, nausea and postprandial pain New onset of jaundice  Work-up is consistent with biliary obstruction of unclear etiology, rule out benign/stone versus malignant.  She has a diffusely dilated CBD, with some abrupt caliber change in the pancreatic portion of the CBD but no definite mass or stricture noted on MRI.  Plan; patient will continue n.p.o. today Tentatively scheduled for ERCP this afternoon with Dr. Carlean Purl though this is dependent on anesthesia availability.  Patient aware that procedure may be rescheduled for tomorrow morning.  Further recommendations pending results of ERCP. If procedure cannot be done today she can go back on clear liquids and then be n.p.o. after midnight.    Active Problems:   Elevated LFTs   Hyperbilirubinemia     LOS: 2 days   Shaolin Armas EsterwoodPA-C  01/13/2021, 8:45 AM

## 2021-01-13 NOTE — Progress Notes (Addendum)
PROGRESS NOTE    Robin [name]  ZOX:[id number] DOB: 12-15-1965 DOA: 01/11/2021 PCP: [name], MD   Chief Complaint  Patient presents with  . Gastroesophageal Reflux    Brief Narrative:  Robin [name] is Robin [name] 55 y.o. female with Bradie Lacock known history of asthma,, anxiety and depression bipolar disorder, borderline personality disorder, IBS, reflux, obstructive sleep apnea presents to the emergency department for evaluation of abdominal pain.  Patient reports three days history of symptoms consistent with prior gastroesophageal reflux disease causing upper abdominal discomfort which is worse at night, worse with food and has caused decreased appetite and decreased p.o. intake, constipation. Minimal weight loss.    Patient denies fevers/chills, weakness, dizziness, chest pain, shortness of breath, dysuria/frequency, changes in mental status.    Otherwise there has been no change in status. Patient has been taking medication as prescribed and there has been no recent change in medication or diet.  No recent antibiotics.  There has been no recent illness, hospitalizations, travel or sick contacts.    EMS/ED Course: Patient received GI cocktail, Protonix. Medical admission has been requested for further management of elevated LFTs, biliary ductal dilatation with possible pancreatic mass for MRCP.    Assessment & Plan:   Active Problems:   Elevated LFTs   Hyperbilirubinemia   Biliary obstruction  This is Robin [name] 55 y.o. female with [name] history of asthma,, anxiety and depression bipolar disorder, borderline personality disorder, IBS, reflux, obstructive sleep apnea now being admitted with:  #.  Elevated LFTs, biliary ductal dilatation, possible pancreatic mass - MRCP with biliary duct distension of both intra and extrahepatic biliary tree with abrupt caliber change, but with no visible mass on imaging.  Endoscopic assessment suggested to exclude occult biliary lesion or atypical stricture -- RUQ  [place] without gallstones, distention of CBD, mild intrahepatic biliary ductal distention -- negative etoh -- LFT's relatively stable today, AST 221, ALT 482, tbili 7.5 - IV PPI - Check hepatitis panel (negative), HIV (negative) -- GI c/s, planning ERCP -- s/p ERCP 5/20, notable for single severe localized biliary stricture found in the lower third of the main bile duct, indeterminate.  Aspirated and fluid sent for cytology.  S/p stent placement.  Plan for outpatient follow up with GI, may need [place] outpatient.  Follow cytology/path with GI.  # Lobulated T2 Bright Lesion in Medial segment of L hepatic lobe  Hyperecholic Lesions noted in the R Hepatic Lobe - per rad, favors clerosed hemangioma - consider 3-6 month follow up with extended delays following contrast to 7 min - hyperechoic lesions on [place] could represent hemangiomas, metastatic disease can't be excluded  #. Acute kidney injury likely secondary to dehydration/decreased p.o. intake - improved with IVF  #.  Mild hypokalemia - improved  #.  History of asthma/allergies Continue Astelin, Pulmicort, Claritin  #. History of anxiety, depression, bipolar disorder, borderline personality disorder - Continue Lamictal, Latuda Neurontin  DVT prophylaxis: SCD Code Status: full  Family Communication: none at bedside Disposition:   Status is: Inpatient  Remains inpatient appropriate because:Inpatient level of care appropriate due to severity of illness   Dispo: The patient is from: Home              Anticipated d/c is to: pending              Patient currently is not medically stable to d/c.   Difficult to place patient No   Consultants:   GI  Procedures: ERCP 5/20  Antimicrobials: Anti-infectives (From admission, onward)  None      Subjective: No complaints, seen in wheelchair on way downstairs  Objective: Vitals:   01/13/21 1718 01/13/21 1728 01/13/21 1738 01/13/21 1748  BP: (!) 141/64 130/69 140/79 (!) 145/63   Pulse: 90 83 80 76  Resp: 13 17 17  (!) 22  Temp: 98.6 F (37 C)     TempSrc: Axillary     SpO2: 100% 100% 97% 97%  Weight:        Intake/Output Summary (Last 24 hours) at 01/13/2021 1802 Last data filed at 01/13/2021 1659 Gross per 24 hour  Intake 800 ml  Output --  Net 800 ml   Filed Weights   01/12/21 1051  Weight: 97.6 kg    Examination:  General: No acute distress.  Seen in chair, leaving room to go down to procedure. Icteric sclera Lungs: unlabored Neurological: Alert and oriented 3. Moves all extremities 4. Cranial nerves II through XII grossly intact. Extremities: No clubbing or cyanosis. No edema.    Data Reviewed: I have personally reviewed following labs and imaging studies  CBC: Recent Labs  Lab 01/11/21 1122 01/12/21 0115 01/13/21 0521  WBC 6.9 7.4 6.6  NEUTROABS 4.5  --  3.7  HGB 13.6 12.8 12.3  HCT 43.0 42.0 39.6  MCV 87.8 89.7 88.4  PLT 217 198 315    Basic Metabolic Panel: Recent Labs  Lab 01/11/21 1122 01/12/21 0115 01/13/21 0521  NA 139 139 139  K 3.4* 4.3 4.6  CL 103 111 109  CO2 26 19* 26  GLUCOSE 105* 104* 105*  BUN 10 8 8   CREATININE 1.11* 0.73 0.83  CALCIUM 9.6 9.0 9.1  MG 1.9  --   --     GFR: CrCl cannot be calculated (Unknown ideal weight.).  Liver Function Tests: Recent Labs  Lab 01/11/21 1122 01/12/21 0115 01/13/21 0521  AST 224* 243* 221*  ALT 505* 500* 482*  ALKPHOS 97 92 99  BILITOT 6.7* 6.7* 7.5*  PROT 6.9 6.3* 6.7  ALBUMIN 4.2 3.4* 3.4*    CBG: No results for input(s): GLUCAP in the last 168 hours.   Recent Results (from the past 240 hour(s))  Resp Panel by RT-PCR (Flu Kanyon Seibold&B, Covid) Nasopharyngeal Swab     Status: None   Collection Time: 01/11/21  5:22 PM   Specimen: Nasopharyngeal Swab; Nasopharyngeal(NP) swabs in vial transport medium  Result Value Ref Range Status   SARS Coronavirus 2 by RT PCR NEGATIVE NEGATIVE Final    Comment: (NOTE) SARS-CoV-2 target nucleic acids are NOT  DETECTED.  The SARS-CoV-2 RNA is generally detectable in upper respiratory specimens during the acute phase of infection. The lowest concentration of SARS-CoV-2 viral copies this assay can detect is 138 copies/mL. Masud Holub negative result does not preclude SARS-Cov-2 infection and should not be used as the sole basis for treatment or other patient management decisions. Angeliz Settlemyre negative result may occur with  improper specimen collection/handling, submission of specimen other than nasopharyngeal swab, presence of viral mutation(s) within the areas targeted by this assay, and inadequate number of viral copies(<138 copies/mL). Estrellita Lasky negative result must be combined with clinical observations, patient history, and epidemiological information. The expected result is Negative.  Fact Sheet for Patients:  EntrepreneurPulse.com.au  Fact Sheet for Healthcare Providers:  IncredibleEmployment.be  This test is no t yet approved or cleared by the [place] FDA and  has been authorized for detection and/or diagnosis of SARS-CoV-2 by FDA under an Emergency Use Authorization (EUA). This EUA will remain  in effect (meaning  this test can be used) for the duration of the COVID-19 declaration under Section 564(b)(1) of the Act, 21 U.S.C.section 360bbb-3(b)(1), unless the authorization is terminated  or revoked sooner.       Influenza Kareemah Grounds by PCR NEGATIVE NEGATIVE Final   Influenza B by PCR NEGATIVE NEGATIVE Final    Comment: (NOTE) The Xpert Xpress SARS-CoV-2/FLU/RSV plus assay is intended as an aid in the diagnosis of influenza from Nasopharyngeal swab specimens and should not be used as Keondra Haydu sole basis for treatment. Nasal washings and aspirates are unacceptable for Xpert Xpress SARS-CoV-2/FLU/RSV testing.  Fact Sheet for Patients: EntrepreneurPulse.com.au  Fact Sheet for Healthcare Providers: IncredibleEmployment.be  This test is not yet  approved or cleared by the [place] FDA and has been authorized for detection and/or diagnosis of SARS-CoV-2 by FDA under an Emergency Use Authorization (EUA). This EUA will remain in effect (meaning this test can be used) for the duration of the COVID-19 declaration under Section 564(b)(1) of the Act, 21 U.S.C. section 360bbb-3(b)(1), unless the authorization is terminated or revoked.  Performed at [facility], [address], [place], [place] [address]          Radiology Studies: MR 3D Recon At Scanner  Result Date: 01/12/2021 CLINICAL DATA:  Abdominal pain, suspected biliary obstruction in Azel Gumina 55 year old female. EXAM: MRI ABDOMEN WITHOUT AND WITH CONTRAST (INCLUDING MRCP) TECHNIQUE: Multiplanar multisequence MR imaging of the abdomen was performed both before and after the administration of intravenous contrast. Heavily T2-weighted images of the biliary and pancreatic ducts were obtained, and three-dimensional MRCP images were rendered by post processing. CONTRAST:  103mL GADAVIST GADOBUTROL 1 MMOL/ML IV SOLN COMPARISON:  CT Jan 11, 2021. FINDINGS: Lower chest: No effusion. No consolidation. Limited assessment of the lung bases on MRI. Hepatobiliary: Marked biliary duct dilation with profound extrahepatic biliary duct distension and moderate intrahepatic biliary duct distension. Abrupt caliber change in the pancreatic portion of the common bile duct. No filling defect in the common bile duct or in the biliary tree. No pancreatic ductal dilation. Hepatic cyst in the LEFT hepatic lobe. Gallbladder is moderately distended without pericholecystic stranding or cholelithiasis. Sludge in the lumen of gallbladder. Lobulated lesion in the LEFT hepatic lobe, medial segment measuring 2.8 x 2.5 cm some peripheral enhancement and some enhancement that is slightly more central on earlier phase is than would be expected for Emmaly Leech classic hemangioma. Fading pattern seen on diffusion.  Marked T2 hyperintensity and well-circumscribed margins. Small cysts elsewhere in the liver. Pancreas: No peripancreatic stranding. Normal intrinsic T1 signal. No substantial ductal dilation or visible lesion at the site of common duct transition. Spleen:  Normal size and contour. Adrenals/Urinary Tract: Normal adrenal glands. Symmetric renal enhancement. No hydronephrosis. No perinephric stranding. Stomach/Bowel: Gastrointestinal tract is unremarkable to the extent evaluated on abdominal MRI. Vascular/Lymphatic: Abdominal aorta is patent without dilation. There is no gastrohepatic or hepatoduodenal ligament lymphadenopathy. No retroperitoneal or mesenteric lymphadenopathy. Other:  No ascites. Musculoskeletal: No suspicious bone lesions identified. IMPRESSION: Biliary duct distension of both intra and extrahepatic biliary tree with abrupt caliber change but with no visible mass on imaging. Caliber change more abrupt than expected for stricture and the biliary tree was top-normal size at 6 mm in 2017. Endoscopic assessment is suggested to exclude an occult biliary lesion or atypical stricture. Lobulated T2 bright lesion in the medial segment of the LEFT hepatic lobe that favors Briggett Tuccillo sclerosed hemangioma. There is some internal septation suggested and the filling pattern is slightly atypical. Consider 3 to six-month  follow-up with extended delays following contrast to 7 minutes better assess filling characteristics, differential consideration would include Eufelia Veno biliary cystadenoma with septal enhancement. Electronically Signed   By: Zetta Bills M.D.   On: 01/12/2021 11:40   US Abdomen Limited  Result Date: 01/12/2021 CLINICAL DATA:  Elevated LFTs. EXAM: ULTRASOUND ABDOMEN LIMITED RIGHT UPPER QUADRANT COMPARISON:  MRI 01/12/2021.  CT 01/11/2021. FINDINGS: Gallbladder: No gallstones or wall thickening visualized. No sonographic Murphy sign noted by sonographer. Common bile duct: Diameter: 13.6 mm. Mild intrahepatic  biliary ductal dilatation also noted. Liver: Bernal Luhman 3.4 cm hyperechoic lesions in the right hepatic lobe. 3.9 cm hyperechoic lesions noted the right middle lobe. Although these could represent hemangiomas, metastatic disease cannot be excluded. Portal vein is patent on color Doppler imaging with normal direction of blood flow towards the liver. Other: None. IMPRESSION: 1. No gallstones. Prominent distention of the common bile duct. Mild intrahepatic biliary ductal distention. Reference is made to MRI report of 01/12/2021. 2. 2 prominent hyperechoic lesions noted the right hepatic lobe. Although these could represent hemangiomas, metastatic disease cannot be excluded. Electronically Signed   By: Marcello Moores  Register   On: 01/12/2021 14:30   MR ABDOMEN MRCP W WO CONTAST  Result Date: 01/12/2021 CLINICAL DATA:  Abdominal pain, suspected biliary obstruction in Debria Broecker 55 year old female. EXAM: MRI ABDOMEN WITHOUT AND WITH CONTRAST (INCLUDING MRCP) TECHNIQUE: Multiplanar multisequence MR imaging of the abdomen was performed both before and after the administration of intravenous contrast. Heavily T2-weighted images of the biliary and pancreatic ducts were obtained, and three-dimensional MRCP images were rendered by post processing. CONTRAST:  39mL GADAVIST GADOBUTROL 1 MMOL/ML IV SOLN COMPARISON:  CT Jan 11, 2021. FINDINGS: Lower chest: No effusion. No consolidation. Limited assessment of the lung bases on MRI. Hepatobiliary: Marked biliary duct dilation with profound extrahepatic biliary duct distension and moderate intrahepatic biliary duct distension. Abrupt caliber change in the pancreatic portion of the common bile duct. No filling defect in the common bile duct or in the biliary tree. No pancreatic ductal dilation. Hepatic cyst in the LEFT hepatic lobe. Gallbladder is moderately distended without pericholecystic stranding or cholelithiasis. Sludge in the lumen of gallbladder. Lobulated lesion in the LEFT hepatic lobe, medial  segment measuring 2.8 x 2.5 cm some peripheral enhancement and some enhancement that is slightly more central on earlier phase is than would be expected for Romero Letizia classic hemangioma. Fading pattern seen on diffusion. Marked T2 hyperintensity and well-circumscribed margins. Small cysts elsewhere in the liver. Pancreas: No peripancreatic stranding. Normal intrinsic T1 signal. No substantial ductal dilation or visible lesion at the site of common duct transition. Spleen:  Normal size and contour. Adrenals/Urinary Tract: Normal adrenal glands. Symmetric renal enhancement. No hydronephrosis. No perinephric stranding. Stomach/Bowel: Gastrointestinal tract is unremarkable to the extent evaluated on abdominal MRI. Vascular/Lymphatic: Abdominal aorta is patent without dilation. There is no gastrohepatic or hepatoduodenal ligament lymphadenopathy. No retroperitoneal or mesenteric lymphadenopathy. Other:  No ascites. Musculoskeletal: No suspicious bone lesions identified. IMPRESSION: Biliary duct distension of both intra and extrahepatic biliary tree with abrupt caliber change but with no visible mass on imaging. Caliber change more abrupt than expected for stricture and the biliary tree was top-normal size at 6 mm in 2017. Endoscopic assessment is suggested to exclude an occult biliary lesion or atypical stricture. Lobulated T2 bright lesion in the medial segment of the LEFT hepatic lobe that favors Wyolene Weimann sclerosed hemangioma. There is some internal septation suggested and the filling pattern is slightly atypical. Consider 3 to six-month follow-up with  extended delays following contrast to 7 minutes better assess filling characteristics, differential consideration would include Ronnesha Mester biliary cystadenoma with septal enhancement. Electronically Signed   By: Zetta Bills M.D.   On: 01/12/2021 11:40        Scheduled Meds: . feeding supplement  1 Container Oral TID BM  . fluticasone  1 spray Each Nare Daily  . lamoTRIgine  100 mg  Oral Daily  . loratadine  10 mg Oral Daily  . lurasidone  40 mg Oral Q breakfast  . pantoprazole (PROTONIX) IV  40 mg Intravenous Q24H   Continuous Infusions: . sodium chloride 100 mL/hr at 01/12/21 1810     LOS: 2 days    Time spent: over 30 min    Fayrene Helper, MD [facility]   To contact the attending provider between 7A-7P or the covering provider during after hours 7P-7A, please log into the web site [email or web address] and access using universal [place] password for that web site. If you do not have the password, please call the hospital operator.  01/13/2021, 6:02 PM

## 2021-01-13 NOTE — Interval H&P Note (Signed)
History and Physical Interval Note:  01/13/2021 3:56 PM  Robin [name]  has presented today for surgery, with the diagnosis of jaundice , abdominal pain, elevated LFT's.  The various methods of treatment have been discussed with the patient and family. After consideration of risks, benefits and other options for treatment, the patient has consented to  Procedure(s): ENDOSCOPIC RETROGRADE CHOLANGIOPANCREATOGRAPHY (ERCP) (N/A) as a surgical intervention.  The patient's history has been reviewed, patient examined, no change in status, stable for surgery.  I have reviewed the patient's chart and labs.  Questions were answered to the patient's satisfaction.     Silvano Rusk

## 2021-01-13 NOTE — Anesthesia Preprocedure Evaluation (Addendum)
Anesthesia Evaluation  Patient identified by MRN, date of birth, ID band Patient awake    Reviewed: Allergy & Precautions, NPO status , Patient's Chart, lab work & pertinent test results  Airway Mallampati: II  TM Distance: >3 FB Neck ROM: Full    Dental  (+) Missing   Pulmonary asthma , sleep apnea ,    Pulmonary exam normal breath sounds clear to auscultation       Cardiovascular hypertension, Normal cardiovascular exam Rhythm:Regular Rate:Normal  ECG: rate 74   Neuro/Psych  Headaches, PSYCHIATRIC DISORDERS Depression Bipolar Disorder    GI/Hepatic Neg liver ROS, PUD, IBS (irritable bowel syndrome)   Endo/Other  negative endocrine ROS  Renal/GU negative Renal ROS     Musculoskeletal  (+) Arthritis ,   Abdominal   Peds  Hematology negative hematology ROS (+)   Anesthesia Other Findings Jaundice  Abdominal pain Elevated LFT's  Reproductive/Obstetrics                            Anesthesia Physical Anesthesia Plan  ASA: II  Anesthesia Plan: General   Post-op Pain Management:    Induction: Intravenous  PONV Risk Score and Plan: 4 or greater and Ondansetron, Dexamethasone, Midazolam and Treatment may vary due to age or medical condition  Airway Management Planned: Oral ETT  Additional Equipment:   Intra-op Plan:   Post-operative Plan: Extubation in OR  Informed Consent: I have reviewed the patients History and Physical, chart, labs and discussed the procedure including the risks, benefits and alternatives for the proposed anesthesia with the patient or authorized representative who has indicated his/her understanding and acceptance.     Dental advisory given  Plan Discussed with: CRNA  Anesthesia Plan Comments:        Anesthesia Quick Evaluation

## 2021-01-14 DIAGNOSIS — M940 Chondrocostal junction syndrome [Tietze]: Secondary | ICD-10-CM

## 2021-01-14 LAB — CBC WITH DIFFERENTIAL/PLATELET
Abs Immature Granulocytes: 0.07 10*3/uL (ref 0.00–0.07)
Basophils Absolute: 0 10*3/uL (ref 0.0–0.1)
Basophils Relative: 0 %
Eosinophils Absolute: 0 10*3/uL (ref 0.0–0.5)
Eosinophils Relative: 0 %
HCT: 40.6 % (ref 36.0–46.0)
Hemoglobin: 12.8 g/dL (ref 12.0–15.0)
Immature Granulocytes: 1 %
Lymphocytes Relative: 15 %
Lymphs Abs: 1.7 10*3/uL (ref 0.7–4.0)
MCH: 27.5 pg (ref 26.0–34.0)
MCHC: 31.5 g/dL (ref 30.0–36.0)
MCV: 87.3 fL (ref 80.0–100.0)
Monocytes Absolute: 0.7 10*3/uL (ref 0.1–1.0)
Monocytes Relative: 7 %
Neutro Abs: 8.6 10*3/uL — ABNORMAL HIGH (ref 1.7–7.7)
Neutrophils Relative %: 77 %
Platelets: 213 10*3/uL (ref 150–400)
RBC: 4.65 MIL/uL (ref 3.87–5.11)
RDW: 13.9 % (ref 11.5–15.5)
WBC: 11.1 10*3/uL — ABNORMAL HIGH (ref 4.0–10.5)
nRBC: 0 % (ref 0.0–0.2)

## 2021-01-14 LAB — COMPREHENSIVE METABOLIC PANEL
ALT: 436 U/L — ABNORMAL HIGH (ref 0–44)
AST: 156 U/L — ABNORMAL HIGH (ref 15–41)
Albumin: 3.3 g/dL — ABNORMAL LOW (ref 3.5–5.0)
Alkaline Phosphatase: 99 U/L (ref 38–126)
Anion gap: 6 (ref 5–15)
BUN: 11 mg/dL (ref 6–20)
CO2: 26 mmol/L (ref 22–32)
Calcium: 9.3 mg/dL (ref 8.9–10.3)
Chloride: 105 mmol/L (ref 98–111)
Creatinine, Ser: 0.97 mg/dL (ref 0.44–1.00)
GFR, Estimated: >60 mL/min (ref >60)
Glucose, Bld: 98 mg/dL (ref 70–99)
Potassium: 4.7 mmol/L (ref 3.5–5.1)
Sodium: 137 mmol/L (ref 135–145)
Total Bilirubin: 3.4 mg/dL — ABNORMAL HIGH (ref 0.3–1.2)
Total Protein: 6.7 g/dL (ref 6.5–8.1)

## 2021-01-14 LAB — PHOSPHORUS: Phosphorus: 4.9 mg/dL — ABNORMAL HIGH (ref 2.5–4.6)

## 2021-01-14 LAB — MAGNESIUM: Magnesium: 1.9 mg/dL (ref 1.7–2.4)

## 2021-01-14 MED ORDER — PANTOPRAZOLE SODIUM 40 MG PO TBEC: 40.0000 mg | DELAYED_RELEASE_TABLET | Freq: Every day | ORAL | Status: DC

## 2021-01-14 MED ORDER — KETOROLAC TROMETHAMINE 30 MG/ML IJ SOLN
30.0000 mg | Freq: Once | INTRAMUSCULAR | Status: AC
  Administered 2021-01-14: 30 mg via INTRAVENOUS
  Filled 2021-01-14: qty 1

## 2021-01-14 NOTE — Progress Notes (Addendum)
   Patient Name: Cooper Stamp Date of Encounter: 01/14/2021, 6:58 AM    Subjective  No problems overnight Still has lower chest pain   Objective  BP 139/82 (BP Location: Left Arm)   Pulse 68   Temp 97.9 F (36.6 C) (Oral)   Resp 16   Wt 97.6 kg   SpO2 96%   BMI 31.78 kg/m  NAD abd soft and NT  Xiphoid  And lower sternum tender  Labs pend   Assessment and Plan  Biliary obstruction from distal cbd vs ampullary stricture treated with stent and pathology pending  Liver lesion ? Atypical  Hemangioma   Costochondritis   OK to dc we will call results and plans   Tx costochondritis 1 dose Toradol here  Ok to use NSAID at [employer] on med list  PPI not necessary at dc in my opinion  Work note thru wed    Gatha Mayer, MD, [facility] 01/14/2021 6:58 AM

## 2021-01-14 NOTE — Discharge Summary (Signed)
[name] Discharge Summary  Robin [name] [id number] DOB: [birth year] DOA: 01/11/2021  PCP: Robin Barrack, MD  Admit date: 01/11/2021 Discharge date: 01/14/2021  Time spent: 40 minutes  Recommendations for Outpatient Follow-up:  1. Follow outpatient CBC/CMP 2. Follow with GI outpatient  3. Follow pathology/cytology from ERCP 4. May need EUS? Follow with GI 5. MRCP concerning for sclerosed hemangioma, atypical findings? Needs repeat imaging (see report).  Similarly RUQ [place] concerning for hemangioma, can't rule out mets - follow outpatient   Discharge Diagnoses:  Active Problems:   Elevated LFTs   Hyperbilirubinemia   Biliary obstruction   Discharge Condition: stable  Diet recommendation: heart healthy  Filed Weights   01/12/21 1051  Weight: 97.6 kg    History of present illness:  Robin [name] 55 y.o.femalewith Robin [name] known history of asthma,, anxiety and depression bipolar disorder, borderline personality disorder, IBS, reflux, obstructive sleep apneapresents to the emergency department for evaluation of abdominal pain. Patientreportsthree days history ofsymptoms consistent with prior gastroesophageal reflux disease causing upper abdominal discomfort which is worse at night, worse with food and has caused decreased appetite and decreased p.o. intake, constipation. Minimal weight loss.  Patient denies fevers/chills, weakness, dizziness, chest pain, shortness of breath, dysuria/frequency, changes in mental status.   Otherwise there has been no change in status. Patient has been taking medication as prescribed and there has been no recent change in medication or diet. No recent antibiotics. There has been no recent illness, hospitalizations, travel or sick contacts.   EMS/ED Course:Patient received GI cocktail, Protonix. Medical admission has been requested for further management ofelevated LFTs, biliary ductal dilatation with possible pancreatic mass for  MRCP.  She was admitted with elevated LFT's.  ERCP noted biliary stricture, stent placed.  Pathology/cytology is pending.  Plan for discharge with outpatient follow up.  Hospital Course:   This is a51 y.o.femalewith Robin [name] history of asthma,, anxiety and depression bipolar disorder, borderline personality disorder, IBS, reflux, obstructive sleep apneanow being admitted with:  #.Elevated LFTs, biliary ductal dilatation, possible pancreatic mass -MRCP with biliary duct distension of both intra and extrahepatic biliary tree with abrupt caliber change, but with no visible mass on imaging.  Endoscopic assessment suggested to exclude occult biliary lesion or atypical stricture -- RUQ [place] without gallstones, distention of CBD, mild intrahepatic biliary ductal distention -- negative etoh -- LFT's improving today after stent -> follow outpatient  -IV PPI -Check hepatitis panel (negative), HIV (negative) -- GI c/s, planning ERCP -- s/p ERCP 5/20, notable for single severe localized biliary stricture found in the lower third of the main bile duct, indeterminate.  Aspirated and fluid sent for cytology.  S/p stent placement.  Plan for outpatient follow up with GI, may need [place] outpatient.  Follow cytology/path with GI.  # Lobulated T2 Bright Lesion in Medial segment of L hepatic lobe  Hyperecholic Lesions noted in the R Hepatic Lobe - per rad, favors clerosed hemangioma - consider 3-6 month follow up with extended delays following contrast to 7 min - hyperechoic lesions on [place] could represent hemangiomas, metastatic disease can't be excluded  #.Acute kidney injurylikely secondary to dehydration/decreased p.o. intake - improved with IVF  #. Mild hypokalemia -improved  #.History of asthma/allergies Continue Astelin, Pulmicort, Claritin  #. History ofanxiety, depression, bipolar disorder, borderline personality disorder - Continue Latuda Neurontin - discontinue lamictal off med list  as she's no longer taking this   Procedures: ERCP impression - Robin [name] single severe localized biliary stricture was found in the lower third of the main  bile duct. The stricture was indeterminate. This was aspirated and the fluid was sent for cytology. This stricture was treated with stent placement. Recommendations - Return patient to hospital ward for ongoing care. - She can go home tomorrow if ok - we will see her. Labs ordered - clears only tonight.  Consultations:  GI  Discharge Exam: Vitals:   01/14/21 0241 01/14/21 0639  BP: 126/76 139/82  Pulse: 73 68  Resp: 16 16  Temp: 98 F (36.7 C) 97.9 F (36.6 C)  SpO2: 98% 96%   No new concerns Eager to discharge home  General: No acute distress. Cardiovascular: Heart sounds show Robin [name] regular rate, and rhythm.  Lungs: Clear to auscultation bilaterally  Abdomen: Soft, nontender, nondistended  Neurological: Alert and oriented 3. Moves all extremities 4. Cranial nerves II through XII grossly intact. Skin: Warm and dry. No rashes or lesions. Extremities: No clubbing or cyanosis. No edema.   Discharge Instructions   Discharge Instructions    Call MD for:  difficulty breathing, headache or visual disturbances   Complete by: As directed    Call MD for:  extreme fatigue   Complete by: As directed    Call MD for:  hives   Complete by: As directed    Call MD for:  persistant dizziness or light-headedness   Complete by: As directed    Call MD for:  persistant nausea and vomiting   Complete by: As directed    Call MD for:  redness, tenderness, or signs of infection (pain, swelling, redness, odor or green/yellow discharge around incision site)   Complete by: As directed    Call MD for:  severe uncontrolled pain   Complete by: As directed    Call MD for:  temperature >100.4   Complete by: As directed    Diet - low sodium heart healthy   Complete by: As directed    Discharge instructions   Complete by: As directed    You were  seen for elevated liver enzymes.   You had an ERCP which showed Robin [name] severe biliary stricture.  Cytology and pathology from this procedure are pending.  You may need an endoscopic ultrasound with gastroenterology outpatient.  Please follow up with your PCP and gastroenterology outpatient.  You had some abnormal findings in your liver on ultrasound and MRCP.  They recommended follow up imaging.  Please follow this up with your PCP outpatient.    Please follow outpatient with your behavioral health provider/psychiatrist how you should be taking your medicines.  I've stopped your lamictal since you said you are not taking it.  This is not an as needed medicine and should only be resumed under the supervision of Robin [name].   Return for new, recurrent, or worsening symptoms.  Please ask your PCP to request records from this hospitalization so they know what was done and what the next steps will be.   Increase activity slowly   Complete by: As directed      Allergies as of 01/14/2021      Reactions   Aspirin Nausea And Vomiting, Rash   Bee Venom Anaphylaxis   Penicillins Anaphylaxis   Bactrim [sulfamethoxazole-trimethoprim] Nausea And Vomiting   Sulfa Antibiotics Nausea And Vomiting      Medication List    STOP taking these medications   azithromycin 250 MG tablet Commonly known as: ZITHROMAX   diclofenac 50 MG EC tablet Commonly known as: VOLTAREN   HYDROcodone-acetaminophen 5-325 MG tablet Commonly known as: NORCO/VICODIN  lamoTRIgine 100 MG tablet Commonly known as: LAMICTAL   predniSONE 50 MG tablet Commonly known as: DELTASONE   QUEtiapine 200 MG 24 hr tablet Commonly known as: SEROQUEL XR     TAKE these medications   azelastine 0.1 % nasal spray Commonly known as: ASTELIN Place 2 sprays into both nostrils 2 (two) times daily. What changed:   when to take this  reasons to take this   budesonide 0.5 MG/2ML nebulizer solution Commonly known as: PULMICORT Take 0.5  mg by nebulization daily as needed (sob/wheezing).   cetirizine 10 MG tablet Commonly known as: ZYRTEC Take 10 mg by mouth daily.   clobetasol cream 0.05 % Commonly known as: TEMOVATE Apply 1 application topically 2 (two) times daily.   cyclobenzaprine 5 MG tablet Commonly known as: FLEXERIL TAKE 1 TABLET(5 MG) BY MOUTH AT BEDTIME AS NEEDED FOR LOWER BACK PAIN OR SEVERE HEADACHE   dicyclomine 10 MG capsule Commonly known as: BENTYL Take 1 capsule (10 mg total) by mouth in the morning and at bedtime.   EPINEPHrine 0.3 mg/0.3 mL Soaj injection Commonly known as: EPI-PEN Inject 0.3 mg into the muscle as needed for anaphylaxis. INJECT 0.3 MLS (0.3 MG TOTAL) INTO THE MUSCLE ONCE   fluticasone 50 MCG/ACT nasal spray Commonly known as: FLONASE Place 1 spray into both nostrils in the morning and at bedtime.   gabapentin 300 MG capsule Commonly known as: NEURONTIN Take 300 mg by mouth daily as needed (pain).   Latuda 40 MG Tabs tablet Generic drug: lurasidone Take 1 tablet (40 mg total) by mouth daily with breakfast.   Mobic 15 MG tablet Generic drug: meloxicam Take 1 tablet by mouth daily.   ondansetron 8 MG disintegrating tablet Commonly known as: Zofran ODT Take 1 tablet (8 mg total) by mouth every 8 (eight) hours as needed for nausea or vomiting.   prochlorperazine 10 MG tablet Commonly known as: COMPAZINE TAKE 1 TABLET BY MOUTH EVERY 8 HOURS AS NEEDED( ALONG WITH DICLOFENAC AND DIPHENHYDRAMINE FOR MIGRAINES)   Restasis 0.05 % ophthalmic emulsion Generic drug: cycloSPORINE INSTILL 1 DROP IN EACH EYE TWICE DAILY   valACYclovir 1000 MG tablet Commonly known as: VALTREX Take 1,000 mg by mouth daily as needed (shingles).      Allergies  Allergen Reactions  . Aspirin Nausea And Vomiting and Rash  . Bee Venom Anaphylaxis  . Penicillins Anaphylaxis  . Bactrim [Sulfamethoxazole-Trimethoprim] Nausea And Vomiting  . Sulfa Antibiotics Nausea And Vomiting      The  results of significant diagnostics from this hospitalization (including imaging, microbiology, ancillary and laboratory) are listed below for reference.    Significant Diagnostic Studies: CT Abdomen Pelvis Wo Contrast  Result Date: 01/11/2021 CLINICAL DATA:  Epigastric and left upper quadrant abdominal pain for 1 week. Decreased appetite. EXAM: CT ABDOMEN AND PELVIS WITHOUT CONTRAST TECHNIQUE: Multidetector CT imaging of the abdomen and pelvis was performed following the standard protocol without IV contrast. COMPARISON:  None. FINDINGS: Lower chest: No acute findings. Hepatobiliary: 2.9 cm low-attenuation mass is seen in segment 4A which cannot be characterized on this unenhanced exam. Several other tiny sub-cm low-attenuation lesions are also seen in the right and left lobes which may represent tiny cysts but cannot be definitively characterized without IV contrast. Gallbladder is unremarkable. Diffuse biliary ductal dilatation is seen with common bile duct measuring approximately 2 cm in diameter. Pancreas: Soft tissue prominence is noted in the region of the pancreatic head, and pancreatic mass cannot be excluded on this unenhanced exam. No  evidence of pancreatic ductal dilatation or peripancreatic inflammatory changes. Spleen:  Within normal limits in size. Adrenals/Urinary tract: No evidence of urolithiasis or hydronephrosis. Unremarkable unopacified urinary bladder. Stomach/Bowel: No evidence of obstruction, inflammatory process, or abnormal fluid collections. Normal appendix visualized. Right-sided colonic diverticulosis is noted, however there is no evidence of diverticulitis. Vascular/Lymphatic: No pathologically enlarged lymph nodes identified. No evidence of abdominal aortic aneurysm. Reproductive: Prior hysterectomy noted. Adnexal regions are unremarkable in appearance. Other:  None. Musculoskeletal:  No suspicious bone lesions identified. IMPRESSION: Multiple low-attenuation liver lesions, which  cannot be characterized on this unenhanced exam. Abdomen MRI without and with contrast is recommended for further characterization. Diffuse biliary ductal dilatation, with soft tissue prominence in the region of the pancreatic head. Pancreatic mass cannot be excluded on this unenhanced exam. Recommend abdomen MRI and MRCP without and with contrast for further evaluation. Colonic diverticulosis, without radiographic evidence of diverticulitis. Electronically Signed   By: Robin [name] M.D.   On: 01/11/2021 16:05   MR 3D Recon At Scanner  Result Date: 01/12/2021 CLINICAL DATA:  Abdominal pain, suspected biliary obstruction in Wallace Cogliano 55 year old female. EXAM: MRI ABDOMEN WITHOUT AND WITH CONTRAST (INCLUDING MRCP) TECHNIQUE: Multiplanar multisequence MR imaging of the abdomen was performed both before and after the administration of intravenous contrast. Heavily T2-weighted images of the biliary and pancreatic ducts were obtained, and three-dimensional MRCP images were rendered by post processing. CONTRAST:  43mL GADAVIST GADOBUTROL 1 MMOL/ML IV SOLN COMPARISON:  CT Jan 11, 2021. FINDINGS: Lower chest: No effusion. No consolidation. Limited assessment of the lung bases on MRI. Hepatobiliary: Marked biliary duct dilation with profound extrahepatic biliary duct distension and moderate intrahepatic biliary duct distension. Abrupt caliber change in the pancreatic portion of the common bile duct. No filling defect in the common bile duct or in the biliary tree. No pancreatic ductal dilation. Hepatic cyst in the LEFT hepatic lobe. Gallbladder is moderately distended without pericholecystic stranding or cholelithiasis. Sludge in the lumen of gallbladder. Lobulated lesion in the LEFT hepatic lobe, medial segment measuring 2.8 x 2.5 cm some peripheral enhancement and some enhancement that is slightly more central on earlier phase is than would be expected for Cavin Longman classic hemangioma. Fading pattern seen on diffusion. Marked T2  hyperintensity and well-circumscribed margins. Small cysts elsewhere in the liver. Pancreas: No peripancreatic stranding. Normal intrinsic T1 signal. No substantial ductal dilation or visible lesion at the site of common duct transition. Spleen:  Normal size and contour. Adrenals/Urinary Tract: Normal adrenal glands. Symmetric renal enhancement. No hydronephrosis. No perinephric stranding. Stomach/Bowel: Gastrointestinal tract is unremarkable to the extent evaluated on abdominal MRI. Vascular/Lymphatic: Abdominal aorta is patent without dilation. There is no gastrohepatic or hepatoduodenal ligament lymphadenopathy. No retroperitoneal or mesenteric lymphadenopathy. Other:  No ascites. Musculoskeletal: No suspicious bone lesions identified. IMPRESSION: Biliary duct distension of both intra and extrahepatic biliary tree with abrupt caliber change but with no visible mass on imaging. Caliber change more abrupt than expected for stricture and the biliary tree was top-normal size at 6 mm in 2017. Endoscopic assessment is suggested to exclude an occult biliary lesion or atypical stricture. Lobulated T2 bright lesion in the medial segment of the LEFT hepatic lobe that favors Nada Godley sclerosed hemangioma. There is some internal septation suggested and the filling pattern is slightly atypical. Consider 3 to six-month follow-up with extended delays following contrast to 7 minutes better assess filling characteristics, differential consideration would include Nesbit Michon biliary cystadenoma with septal enhancement. Electronically Signed   By: Robin [name].  On: 01/12/2021 11:40   US Abdomen Limited  Result Date: 01/12/2021 CLINICAL DATA:  Elevated LFTs. EXAM: ULTRASOUND ABDOMEN LIMITED RIGHT UPPER QUADRANT COMPARISON:  MRI 01/12/2021.  CT 01/11/2021. FINDINGS: Gallbladder: No gallstones or wall thickening visualized. No sonographic Murphy sign noted by sonographer. Common bile duct: Diameter: 13.6 mm. Mild intrahepatic biliary  ductal dilatation also noted. Liver: Adrien Shankar 3.4 cm hyperechoic lesions in the right hepatic lobe. 3.9 cm hyperechoic lesions noted the right middle lobe. Although these could represent hemangiomas, metastatic disease cannot be excluded. Portal vein is patent on color Doppler imaging with normal direction of blood flow towards the liver. Other: None. IMPRESSION: 1. No gallstones. Prominent distention of the common bile duct. Mild intrahepatic biliary ductal distention. Reference is made to MRI report of 01/12/2021. 2. 2 prominent hyperechoic lesions noted the right hepatic lobe. Although these could represent hemangiomas, metastatic disease cannot be excluded. Electronically Signed   By: Robin [name]   On: 01/12/2021 14:30   DG Chest Port 1 View  Result Date: 01/11/2021 CLINICAL DATA:  Chest pain. EXAM: PORTABLE CHEST 1 VIEW COMPARISON:  03/13/2016. FINDINGS: The heart size and mediastinal contours are within normal limits. Both lungs are clear. No visible pleural effusions or pneumothorax. No acute osseous abnormality. IMPRESSION: No active disease. Electronically Signed   By: Robin Sheffield MD   On: 01/11/2021 13:19   DG ERCP BILIARY & PANCREATIC DUCTS  Result Date: 01/14/2021 CLINICAL DATA:  Biliary obstruction EXAM: ERCP TECHNIQUE: Multiple spot images obtained with the fluoroscopic device and submitted for interpretation post-procedure. FLUOROSCOPY TIME:  Fluoroscopy Time:  4 minutes 30 seconds Radiation Exposure Index (if provided by the fluoroscopic device): 88.32 mGy Number of Acquired Spot Images: 0 COMPARISON:  MRCP 01/12/2021 FINDINGS: Tyshana Nishida total of 10 intraoperative saved images are submitted for review. The images demonstrate Denys Labree flexible duodenal scope in the descending duodenum with wire cannulation of the main pancreatic duct. Contrast opacification demonstrates Robin [name] mildly tortuous and dilated pancreatic duct. Subsequent images show sphincterotomy. Subsequently, wire cannulation of the main  pancreatic duct is again achieved followed by cannulation of the common bile duct. Subsequent images again demonstrate sphincterotomy and placement of Robin [name] plastic biliary stent. IMPRESSION: 1. Dilation of the common bile duct and main pancreatic duct. 2. Sphincterotomy and placement of Robin [name] plastic biliary stent. These images were submitted for radiologic interpretation only. Please see the procedural report for the amount of contrast and the fluoroscopy time utilized. Electronically Signed   By: Robin Cadet M.D.   On: 01/14/2021 09:43   MR ABDOMEN MRCP W WO CONTAST  Result Date: 01/12/2021 CLINICAL DATA:  Abdominal pain, suspected biliary obstruction in Annel Zunker 55 year old female. EXAM: MRI ABDOMEN WITHOUT AND WITH CONTRAST (INCLUDING MRCP) TECHNIQUE: Multiplanar multisequence MR imaging of the abdomen was performed both before and after the administration of intravenous contrast. Heavily T2-weighted images of the biliary and pancreatic ducts were obtained, and three-dimensional MRCP images were rendered by post processing. CONTRAST:  14mL GADAVIST GADOBUTROL 1 MMOL/ML IV SOLN COMPARISON:  CT Jan 11, 2021. FINDINGS: Lower chest: No effusion. No consolidation. Limited assessment of the lung bases on MRI. Hepatobiliary: Marked biliary duct dilation with profound extrahepatic biliary duct distension and moderate intrahepatic biliary duct distension. Abrupt caliber change in the pancreatic portion of the common bile duct. No filling defect in the common bile duct or in the biliary tree. No pancreatic ductal dilation. Hepatic cyst in the LEFT hepatic lobe. Gallbladder is moderately distended without pericholecystic stranding or cholelithiasis. Sludge in the lumen  of gallbladder. Lobulated lesion in the LEFT hepatic lobe, medial segment measuring 2.8 x 2.5 cm some peripheral enhancement and some enhancement that is slightly more central on earlier phase is than would be expected for Ahri Olson classic hemangioma. Fading pattern  seen on diffusion. Marked T2 hyperintensity and well-circumscribed margins. Small cysts elsewhere in the liver. Pancreas: No peripancreatic stranding. Normal intrinsic T1 signal. No substantial ductal dilation or visible lesion at the site of common duct transition. Spleen:  Normal size and contour. Adrenals/Urinary Tract: Normal adrenal glands. Symmetric renal enhancement. No hydronephrosis. No perinephric stranding. Stomach/Bowel: Gastrointestinal tract is unremarkable to the extent evaluated on abdominal MRI. Vascular/Lymphatic: Abdominal aorta is patent without dilation. There is no gastrohepatic or hepatoduodenal ligament lymphadenopathy. No retroperitoneal or mesenteric lymphadenopathy. Other:  No ascites. Musculoskeletal: No suspicious bone lesions identified. IMPRESSION: Biliary duct distension of both intra and extrahepatic biliary tree with abrupt caliber change but with no visible mass on imaging. Caliber change more abrupt than expected for stricture and the biliary tree was top-normal size at 6 mm in 2017. Endoscopic assessment is suggested to exclude an occult biliary lesion or atypical stricture. Lobulated T2 bright lesion in the medial segment of the LEFT hepatic lobe that favors Myley Bahner sclerosed hemangioma. There is some internal septation suggested and the filling pattern is slightly atypical. Consider 3 to six-month follow-up with extended delays following contrast to 7 minutes better assess filling characteristics, differential consideration would include Andersson Larrabee biliary cystadenoma with septal enhancement. Electronically Signed   By: Zetta Bills M.D.   On: 01/12/2021 11:40    Microbiology: Recent Results (from the past 240 hour(s))  Resp Panel by RT-PCR (Flu Kethan Papadopoulos&B, Covid) Nasopharyngeal Swab     Status: None   Collection Time: 01/11/21  5:22 PM   Specimen: Nasopharyngeal Swab; Nasopharyngeal(NP) swabs in vial transport medium  Result Value Ref Range Status   SARS Coronavirus 2 by RT PCR NEGATIVE  NEGATIVE Final    Comment: (NOTE) SARS-CoV-2 target nucleic acids are NOT DETECTED.  The SARS-CoV-2 RNA is generally detectable in upper respiratory specimens during the acute phase of infection. The lowest concentration of SARS-CoV-2 viral copies this assay can detect is 138 copies/mL. Sharniece Gibbon negative result does not preclude SARS-Cov-2 infection and should not be used as the sole basis for treatment or other patient management decisions. Natalyia Innes negative result may occur with  improper specimen collection/handling, submission of specimen other than nasopharyngeal swab, presence of viral mutation(s) within the areas targeted by this assay, and inadequate number of viral copies(<138 copies/mL). Pedro Whiters negative result must be combined with clinical observations, patient history, and epidemiological information. The expected result is Negative.  Fact Sheet for Patients:  EntrepreneurPulse.com.au  Fact Sheet for Healthcare Providers:  IncredibleEmployment.be  This test is no t yet approved or cleared by the [place] FDA and  has been authorized for detection and/or diagnosis of SARS-CoV-2 by FDA under an Emergency Use Authorization (EUA). This EUA will remain  in effect (meaning this test can be used) for the duration of the COVID-19 declaration under Section 564(b)(1) of the Act, 21 U.S.C.section 360bbb-3(b)(1), unless the authorization is terminated  or revoked sooner.       Influenza Female Minish by PCR NEGATIVE NEGATIVE Final   Influenza B by PCR NEGATIVE NEGATIVE Final    Comment: (NOTE) The Xpert Xpress SARS-CoV-2/FLU/RSV plus assay is intended as an aid in the diagnosis of influenza from Nasopharyngeal swab specimens and should not be used as Tessica Cupo sole basis for treatment. Nasal washings and  aspirates are unacceptable for Xpert Xpress SARS-CoV-2/FLU/RSV testing.  Fact Sheet for Patients: EntrepreneurPulse.com.au  Fact Sheet for Healthcare  Providers: IncredibleEmployment.be  This test is not yet approved or cleared by the [place] FDA and has been authorized for detection and/or diagnosis of SARS-CoV-2 by FDA under an Emergency Use Authorization (EUA). This EUA will remain in effect (meaning this test can be used) for the duration of the COVID-19 declaration under Section 564(b)(1) of the Act, 21 U.S.C. section 360bbb-3(b)(1), unless the authorization is terminated or revoked.  Performed at [facility], [address] [place], [place] [address]      Labs: Basic Metabolic Panel: Recent Labs  Lab 01/11/21 1122 01/12/21 0115 01/13/21 0521 01/14/21 0802  NA 139 139 139 137  K 3.4* 4.3 4.6 4.7  CL 103 111 109 105  CO2 26 19* 26 26  GLUCOSE 105* 104* 105* 98  BUN 10 8 8 11   CREATININE 1.11* 0.73 0.83 0.97  CALCIUM 9.6 9.0 9.1 9.3  MG 1.9  --   --  1.9  PHOS  --   --   --  4.9*   Liver Function Tests: Recent Labs  Lab 01/11/21 1122 01/12/21 0115 01/13/21 0521 01/14/21 0802  AST 224* 243* 221* 156*  ALT 505* 500* 482* 436*  ALKPHOS 97 92 99 99  BILITOT 6.7* 6.7* 7.5* 3.4*  PROT 6.9 6.3* 6.7 6.7  ALBUMIN 4.2 3.4* 3.4* 3.3*   Recent Labs  Lab 01/11/21 1122  LIPASE 16   No results for input(s): AMMONIA in the last 168 hours. CBC: Recent Labs  Lab 01/11/21 1122 01/12/21 0115 01/13/21 0521 01/14/21 0802  WBC 6.9 7.4 6.6 11.1*  NEUTROABS 4.5  --  3.7 8.6*  HGB 13.6 12.8 12.3 12.8  HCT 43.0 42.0 39.6 40.6  MCV 87.8 89.7 88.4 87.3  PLT 217 198 193 213   Cardiac Enzymes: No results for input(s): CKTOTAL, CKMB, CKMBINDEX, TROPONINI in the last 168 hours. BNP: BNP (last 3 results) No results for input(s): BNP in the last 8760 hours.  ProBNP (last 3 results) No results for input(s): PROBNP in the last 8760 hours.  CBG: No results for input(s): GLUCAP in the last 168 hours.     Signed:  Fayrene Helper MD.  [facility] 01/14/2021,  11:51 AM

## 2021-01-14 NOTE — Progress Notes (Signed)
Patient discharged to home with friend, discharge instructions reviewed with patient who verbalized understanding.

## 2021-01-14 NOTE — Progress Notes (Signed)
PHARMACIST - PHYSICIAN COMMUNICATION  DR:   Florene Glen  CONCERNING: IV to Oral Route Change Policy  RECOMMENDATION: This patient is receiving pantoprazole by the intravenous route.  Based on criteria approved by the Pharmacy and Therapeutics Committee, the intravenous medication(s) is/are being converted to the equivalent oral dose form(s).   DESCRIPTION: These criteria include:  The patient is eating (either orally or via tube) and/or has been taking other orally administered medications for a least 24 hours  The patient has no evidence of active gastrointestinal bleeding or impaired GI absorption (gastrectomy, short bowel, patient on TNA or NPO).  If you have questions about this conversion, please contact the Pharmacy Department  []   [phone] )  [facility] []   [phone] )  [facility] []   [phone] )  [facility] []   [phone] )  [facility] [x]   [phone] )  [place], [facility] 01/14/2021 9:55 AM

## 2021-01-15 NOTE — Anesthesia Postprocedure Evaluation (Signed)
Anesthesia Post Note  Patient: Robin [name]  Procedure(s) Performed: ENDOSCOPIC RETROGRADE CHOLANGIOPANCREATOGRAPHY (ERCP) (N/A ) BILIARY BRUSHING SPHINCTEROTOMY BILIARY STENT PLACEMENT (N/A ) BIOPSY     Patient location during evaluation: Endoscopy Anesthesia Type: General Level of consciousness: awake Pain management: pain level controlled Vital Signs Assessment: post-procedure vital signs reviewed and stable Respiratory status: spontaneous breathing, nonlabored ventilation, respiratory function stable and patient connected to nasal cannula oxygen Cardiovascular status: blood pressure returned to baseline and stable Postop Assessment: no apparent nausea or vomiting Anesthetic complications: no   No complications documented.  Last Vitals:  Vitals:   01/14/21 0639 01/14/21 1244  BP: 139/82 (!) 146/71  Pulse: 68 78  Resp: 16 18  Temp: 36.6 C 36.8 C  SpO2: 96% 100%    Last Pain:  Vitals:   01/14/21 1244  TempSrc: Oral  PainSc:                  Jearld Hemp P Aleksander Edmiston

## 2021-01-16 ENCOUNTER — Encounter (HOSPITAL_COMMUNITY): Payer: Self-pay | Admitting: Internal Medicine

## 2021-01-17 ENCOUNTER — Telehealth: Payer: Self-pay

## 2021-01-17 LAB — SURGICAL PATHOLOGY

## 2021-01-17 LAB — CYTOLOGY - NON PAP

## 2021-01-17 NOTE — Telephone Encounter (Cosign Needed)
Transition Care Management Follow-up Telephone Call  Date of discharge and from where: 01/15/20 [facility]  How have you been since you were released from the hospital? Getting etter  Any questions or concerns? No  Items Reviewed:  Did the pt receive and understand the discharge instructions provided? No   Medications obtained and verified? Yes   Other? No   Any new allergies since your discharge? No   Dietary orders reviewed? Yes  Do you have support at home? Yes   Home Care and Equipment/Supplies: Were home health services ordered? not applicable If so, what is the name of the agency?   Has the agency set up a time to come to the patient's home? not applicable Were any new equipment or medical supplies ordered?  No What is the name of the medical supply agency?  Were you able to get the supplies/equipment? not applicable Do you have any questions related to the use of the equipment or supplies? No  Functional Questionnaire: (I = Independent and D = Dependent) ADLs: I  Bathing/Dressing- I  Meal Prep- I  Eating- I  Maintaining continence- I  Transferring/Ambulation- I  Managing Meds- I  Follow up appointments reviewed:   PCP Hospital f/u appt confirmed? Yes  Scheduled to see Dr Jerline Pain  on 01/24/21 @ 1:00.  [facility] f/u appt confirmed? Yes  Scheduled to see [facility]  on 01/27/21 @ 11:30.  Are transportation arrangements needed? No   If their condition worsens, is the pt aware to call PCP or go to the Emergency Dept.? Yes  Was the patient provided with contact information for the PCP's office or ED? Yes  Was to pt encouraged to call back with questions or concerns? Yes

## 2021-01-18 ENCOUNTER — Telehealth: Payer: Self-pay

## 2021-01-18 ENCOUNTER — Other Ambulatory Visit: Payer: Self-pay | Admitting: Internal Medicine

## 2021-01-18 DIAGNOSIS — K831 Obstruction of bile duct: Secondary | ICD-10-CM

## 2021-01-18 NOTE — Telephone Encounter (Signed)
Please advise 

## 2021-01-18 NOTE — Telephone Encounter (Signed)
Patient would like a referral to an oncologist preferably dr. Anabel Halon if not any one else is fine

## 2021-01-19 ENCOUNTER — Other Ambulatory Visit (INDEPENDENT_AMBULATORY_CARE_PROVIDER_SITE_OTHER): Payer: 59

## 2021-01-19 DIAGNOSIS — K831 Obstruction of bile duct: Secondary | ICD-10-CM | POA: Diagnosis not present

## 2021-01-19 LAB — HEPATIC FUNCTION PANEL
ALT: 185 U/L — ABNORMAL HIGH (ref 0–35)
AST: 49 U/L — ABNORMAL HIGH (ref 0–37)
Albumin: 3.9 g/dL (ref 3.5–5.2)
Alkaline Phosphatase: 121 U/L — ABNORMAL HIGH (ref 39–117)
Bilirubin, Direct: 0.7 mg/dL — ABNORMAL HIGH (ref 0.0–0.3)
Total Bilirubin: 1.5 mg/dL — ABNORMAL HIGH (ref 0.2–1.2)
Total Protein: 7.2 g/dL (ref 6.0–8.3)

## 2021-01-20 ENCOUNTER — Telehealth: Payer: Self-pay | Admitting: Internal Medicine

## 2021-01-20 LAB — CANCER ANTIGEN 19-9: CA 19-9: 72 U/mL — ABNORMAL HIGH (ref <34)

## 2021-01-20 LAB — CEA: CEA: <0.5 ng/mL

## 2021-01-20 NOTE — Telephone Encounter (Signed)
Patient notified we will call her once all of the pending lab results or Dr. Carlean Purl will send her a message on [facility]

## 2021-01-20 NOTE — Telephone Encounter (Signed)
Pt called and stated she got her labs results and her numbers are high and she wanted to know what it meant. Please give her a call. Thank you.

## 2021-01-20 NOTE — Telephone Encounter (Signed)
Please check with pt - looks like she has already been seen by oncology. Ok to place referral if still needed.  Algis Greenhouse. Jerline Pain, MD 01/20/2021 2:41 PM

## 2021-01-24 ENCOUNTER — Other Ambulatory Visit: Payer: Self-pay | Admitting: *Deleted

## 2021-01-24 ENCOUNTER — Ambulatory Visit: Payer: 59 | Admitting: Family Medicine

## 2021-01-24 ENCOUNTER — Encounter (HOSPITAL_COMMUNITY): Payer: Self-pay | Admitting: Emergency Medicine

## 2021-01-24 ENCOUNTER — Emergency Department (HOSPITAL_COMMUNITY)
Admission: EM | Admit: 2021-01-24 | Discharge: 2021-01-25 | Disposition: A | Discharge status: Home / Self Care | Payer: 59 | Attending: Emergency Medicine | Admitting: Emergency Medicine

## 2021-01-24 ENCOUNTER — Other Ambulatory Visit: Payer: Self-pay

## 2021-01-24 DIAGNOSIS — R109 Unspecified abdominal pain: Secondary | ICD-10-CM | POA: Diagnosis present

## 2021-01-24 DIAGNOSIS — Z8719 Personal history of other diseases of the digestive system: Secondary | ICD-10-CM | POA: Insufficient documentation

## 2021-01-24 DIAGNOSIS — C229 Malignant neoplasm of liver, not specified as primary or secondary: Secondary | ICD-10-CM

## 2021-01-24 DIAGNOSIS — R1010 Upper abdominal pain, unspecified: Secondary | ICD-10-CM | POA: Diagnosis not present

## 2021-01-24 DIAGNOSIS — J45909 Unspecified asthma, uncomplicated: Secondary | ICD-10-CM | POA: Diagnosis not present

## 2021-01-24 DIAGNOSIS — R1013 Epigastric pain: Secondary | ICD-10-CM | POA: Diagnosis not present

## 2021-01-24 NOTE — ED Triage Notes (Signed)
Pt was discharged from [facility] a week ago after getting a stint in her gallbladder. Pt reports abdominal pain for the past hour. Pt had greasy food for dinner and reports that this is the first bowel movement that she has had in the past few days. Pt has pain in every quadrant of her abdomen. Pt reports nausea but no vomiting.

## 2021-01-24 NOTE — Telephone Encounter (Signed)
Referral placed.

## 2021-01-25 ENCOUNTER — Other Ambulatory Visit: Payer: Self-pay

## 2021-01-25 ENCOUNTER — Emergency Department (HOSPITAL_COMMUNITY): Payer: 59

## 2021-01-25 ENCOUNTER — Telehealth: Payer: Self-pay

## 2021-01-25 DIAGNOSIS — R1010 Upper abdominal pain, unspecified: Secondary | ICD-10-CM

## 2021-01-25 DIAGNOSIS — R109 Unspecified abdominal pain: Secondary | ICD-10-CM | POA: Insufficient documentation

## 2021-01-25 LAB — URINALYSIS, ROUTINE W REFLEX MICROSCOPIC
Bilirubin Urine: NEGATIVE
Glucose, UA: NEGATIVE mg/dL
Hgb urine dipstick: NEGATIVE
Ketones, ur: NEGATIVE mg/dL
Nitrite: NEGATIVE
Protein, ur: NEGATIVE mg/dL
Specific Gravity, Urine: 1.028 (ref 1.005–1.030)
pH: 6 (ref 5.0–8.0)

## 2021-01-25 LAB — COMPREHENSIVE METABOLIC PANEL
ALT: 137 U/L — ABNORMAL HIGH (ref 0–44)
AST: 100 U/L — ABNORMAL HIGH (ref 15–41)
Albumin: 4.1 g/dL (ref 3.5–5.0)
Alkaline Phosphatase: 145 U/L — ABNORMAL HIGH (ref 38–126)
Anion gap: 9 (ref 5–15)
BUN: 17 mg/dL (ref 6–20)
CO2: 22 mmol/L (ref 22–32)
Calcium: 9.6 mg/dL (ref 8.9–10.3)
Chloride: 107 mmol/L (ref 98–111)
Creatinine, Ser: 0.93 mg/dL (ref 0.44–1.00)
GFR, Estimated: >60 mL/min (ref >60)
Glucose, Bld: 122 mg/dL — ABNORMAL HIGH (ref 70–99)
Potassium: 3.8 mmol/L (ref 3.5–5.1)
Sodium: 138 mmol/L (ref 135–145)
Total Bilirubin: 1.4 mg/dL — ABNORMAL HIGH (ref 0.3–1.2)
Total Protein: 7.9 g/dL (ref 6.5–8.1)

## 2021-01-25 LAB — CBC
HCT: 42.6 % (ref 36.0–46.0)
Hemoglobin: 13.6 g/dL (ref 12.0–15.0)
MCH: 27.4 pg (ref 26.0–34.0)
MCHC: 31.9 g/dL (ref 30.0–36.0)
MCV: 85.9 fL (ref 80.0–100.0)
Platelets: 506 10*3/uL — ABNORMAL HIGH (ref 150–400)
RBC: 4.96 MIL/uL (ref 3.87–5.11)
RDW: 13.5 % (ref 11.5–15.5)
WBC: 12.7 10*3/uL — ABNORMAL HIGH (ref 4.0–10.5)
nRBC: 0 % (ref 0.0–0.2)

## 2021-01-25 LAB — I-STAT BETA HCG BLOOD, ED (MC, WL, AP ONLY): I-stat hCG, quantitative: <5 m[IU]/mL (ref <5)

## 2021-01-25 LAB — LIPASE, BLOOD: Lipase: 42 U/L (ref 11–51)

## 2021-01-25 MED ORDER — HYDROMORPHONE HCL 1 MG/ML IJ SOLN
1.0000 mg | Freq: Once | INTRAMUSCULAR | Status: AC
Start: 2021-01-25 — End: 2021-01-25
  Administered 2021-01-25: 1 mg via INTRAMUSCULAR
  Filled 2021-01-25: qty 1

## 2021-01-25 MED ORDER — PANTOPRAZOLE SODIUM 40 MG IV SOLR
40.0000 mg | Freq: Once | INTRAVENOUS | Status: AC
  Administered 2021-01-25: 40 mg via INTRAVENOUS
  Filled 2021-01-25: qty 40

## 2021-01-25 MED ORDER — PROMETHAZINE HCL 25 MG/ML IJ SOLN
12.5000 mg | Freq: Four times a day (QID) | INTRAMUSCULAR | Status: DC | PRN
  Administered 2021-01-25: 12.5 mg via INTRAMUSCULAR
  Filled 2021-01-25: qty 1

## 2021-01-25 NOTE — ED Provider Notes (Signed)
Emergency Medicine Provider Triage Evaluation Note  Robin [name] , a 55 y.o. female  was evaluated in triage.  Pt complains of abdominal pain onset around 2130. Hx of biliary obstruction s/p stent placement by GI. Discharged on 01/14/21. Pain feels similar to when she presented for biliary obstruction, but worse. It is constant and associated with N/V. Had BM x 2 earlier today. No fevers.  Review of Systems  Positive: Abdominal pain, N/V Negative: Fever   Physical Exam  BP (!) 149/75 (BP Location: Left Arm)   Pulse 89   Temp 98.8 F (37.1 C) (Oral)   Resp 16   Ht 5\' 9"  (1.753 m)   Wt 97.6 kg   SpO2 99%   BMI 31.77 kg/m  Gen:   Awake, no distress   Resp:  Normal effort  MSK:   Moves extremities without difficulty  Other:  TTP in the epigastrium and LUQ. No peritoneal signs.  Medical Decision Making  Medically screening exam initiated at 4:38 AM.  Appropriate orders placed.  Chandni Gagan was informed that the remainder of the evaluation will be completed by another provider, this initial triage assessment does not replace that evaluation, and the importance of remaining in the ED until their evaluation is complete.  Abdominal pain   Antonietta Breach, PA-C 01/25/21 0440    Veryl Speak, MD 01/26/21 [phone]

## 2021-01-25 NOTE — Consult Note (Addendum)
Attending physician's note   I have taken an interval history, reviewed the chart and examined the patient. I agree with the Advanced Practitioner's note, impression, and recommendations as outlined.   55 year old female with medical history as outlined below to include recent hospitalization on 01/11/2021.  Previous ERCP, imaging, labs reviewed along with repeat labs and imaging from earlier today.  Abdominal x-ray demonstrates biliary stent to be in good position with appropriate pneumobilia.  Her pain is much improved and she is asking to go home.  She has an appointment with Dr. Michaelle [name] on 02/08/2021.  - Okay for discharge home from a GI standpoint.  To follow-up in surgical clinic as above and can follow-up in the GI clinic following that. - Start with clear liquids and slowly advance diet from there  Robin [name], [place], [place] [phone] office            Referring Provider: Dr. Malvin [name]  Primary Care Physician:  Robin Barrack, MD Primary Gastroenterologist:  Dr. Fuller [name]   Reason for Consultation:  Elevated LFTs, abdominal pain   HPI: Robin [name] is a 55 y.o. female female with a past medical history of asthma, depression, bipolar disorder, migraine headaches, sleep apnea, anal fissure, diverticulitis and colon polyps.  She was admitted to the hospital 01/11/2021 due to having epigastric pain.  At that time, labs in the ED showed elevated LFTs with total bili 6.7.  Alk phos 97.  AST 224.  ALT 505.  Normal lipase level. CTAP without contrast 01/11/2021 identified a 2.9 cm liver lesion in segment 4A.  Several other tiny subcentimeter low-attenuation lesions were noted in the right and left lobes which possibly represent as tiny cysts.  Diffuse biliary ductal dilatation was seen with common bile duct measuring 2 cm in diameter.  A soft tissue prominence was identified to the head of the pancreas, pancreatic mass could not be excluded. An abdominal MRI and MRCP with and  without contrast 01/12/2021 showed intra and extrahepatic biliary duct distention with abrupt caliber change to the biliary duct without a visible mass. The pancreas was normal.  A left liver lesion was thought to be a sclerosed hemangioma but there was some internal septation noted which was atypical and follow-up imaging in 3 to 6 months was recommended. She underwent an ERCP by Dr. Carlean [name] 01/13/2021 which identified a severe biliary stricture to the lower third of the main bile duct.  CBD brushing cytology report was suspicious for malignancy, atypical cells present.  Ampullary biopsy showed benign small bowel mucosa.  Her LFTs drifted downward post ERCP and she was discharged home on 01/14/2021.  Follow-up laboratory studies done 01/19/2021 showed an alk phos level 121.  AST 49.  ALT 185.  Total bili 1.5. CEA was negative. CA 19-9 was elevated at 72.   Dr. Carlean [name] considered a liver biopsy versus EUS versus surgery evaluation.  She was referred to Dr. Michaelle [name] regarding biliary versus pancreatic tumor evaluation/resection, possible Whipple surgery.  She was also placed on the cancer conference/tumor board list to review her case.   She developed severe upper abdominal pain which radiated through to her mid back with nausea and vomiting x 2 yesterday evening which occurred after she ate fried chicken, creamed corn and collard greens. She vomited up the food she ate a few hours earlier, no coffee ground emesis or hematemesis. She  stated her symptoms were similar to how she felt prior to her 01/11/2021 hospital admission when diagnosed with obstructive biliary  stricture.  She call 911 and she was transported to [facility] for further evaluation.  Labs in the ED showed an alk phos level of 145.  Total bili 1.4.  AST 100.  ALT 137.  Lipase 42.  BUN 17.  Creatinine 0.93.  WBC 12.7.  Hemoglobin 13.6.  Platelet 506.  SARS coronavirus 2 test not done. A RUQ sono showed prominent distention of the common bile  duct and mild intrahepatic biliary ductal dilatation. A 3.4 cm lesion in the right hepatic lobe and a 3.9 cm lesion in the right middle hepatic lobe, possible hemangiomas, metastatic disease could not be excluded.  A GI consult was requested for further evaluation regarding abdominal pain, elevated LFTs with a biliary stricture s/p ERCP with stent placement 01/13/2021 as noted above.   She received Dilaudid 1 mg IV, Phenergan 12.5 mg IV and Protonix 40 mg IV while in the ED.  No further nausea or vomiting.  She rated her upper abdominal pain a 10 out of 10 upon arrival to the ED which has decreased to a level 2.  She remains NPO at this time.  She denies having any chest pain, palpitations or shortness of breath.  She stated last evening was the first time she ate any fried food since she was discharged from the hospital 5/21.  He developed significant constipation following her hospital discharge as well.  No bowel movement x 10 days.  She passed 2 normal formed brown bowel movements daily without taking any laxatives.  No rectal bleeding or melena.  She takes Aleve once or twice monthly for aches and pains.  No alcohol or drug use.  No family history of biliary, liver or pancreatic cancer.  No other complaints at this time.   Image Studies:  RUQ sonogram 01/12/2021: 1. No gallstones. Prominent distention of the common bile duct. Mild intrahepatic biliary ductal distention. Reference is made to MRI report of 01/12/2021.  2. 2 prominent hyperechoic lesions noted the right hepatic lobe. Although these could represent hemangiomas, metastatic disease cannot be excluded.  Abdominal MRI and MRCP with and without contrast 01/12/2021: Biliary duct distension of both intra and extrahepatic biliary tree with abrupt caliber change but with no visible mass on imaging. Caliber change more abrupt than expected for stricture and the biliary tree was top-normal size at 6 mm in 2017. Endoscopic assessment is  suggested to exclude an occult biliary lesion or atypical stricture.  Lobulated T2 bright lesion in the medial segment of the LEFT hepatic lobe that favors a sclerosed hemangioma. There is some internal septation suggested and the filling pattern is slightly atypical. Consider 3 to six-month follow-up with extended delays following contrast to 7 minutes better assess filling characteristics, differential consideration would include a biliary cystadenoma with septal enhancement.   Abdominal/pelvic CT without contrast 01/11/2021: Multiple low-attenuation liver lesions, which cannot be characterized on this unenhanced exam. Abdomen MRI without and with contrast is recommended for further characterization.  Diffuse biliary ductal dilatation, with soft tissue prominence in the region of the pancreatic head. Pancreatic mass cannot be excluded on this unenhanced exam. Recommend abdomen MRI and MRCP without and with contrast for further evaluation.  Colonic diverticulosis, without radiographic evidence of Diverticulitis.  Recent GI procedures:  ERCP 01/13/2021 by Dr. Carlean [name]: A single severe localized biliary stricture was found in the lower third of the main bile duct. The stricture was indeterminate. This was aspirated and the fluid was sent for cytology. This stricture was treated with stent placement.  A. AMPULLARY, BIOPSY:  - Benign small bowel mucosa  - No acute inflammation, villous blunting or increased intraepithelial  Lymphocytes Common bile duct brushings cytology report - Suspicious for malignancy - Atypical cells present   Colonoscopy 03/23/2020: - One 5 mm polyp in the transverse colon, removed with a cold snare. Resected and retrieved. - Mild diverticulosis in the right colon. - Mild diverticulosis in the left colon. - Internal hemorrhoids. - The examination was otherwise normal on direct and retroflexion views. - 10 year recall BENIGN COLONIC MUCOSA (1 OF 1  FRAGMENTS) - NO HIGH GRADE DYSPLASIA OR MALIGNANCY IDENTIFIED   Past Medical History:  Diagnosis Date  . Asthma   . Bipolar disorder ([place])   . Diverticulosis   . Gestational hypertension   . HA (headache)   . History of borderline personality disorder   . Hypersomnia, persistent 04/29/2013  . IBS (irritable bowel syndrome)   . Memory loss, short term    [name] 04-03-13   . Migraine   . Reflux esophagitis   . Seasonal allergies     Past Surgical History:  Procedure Laterality Date  . ABDOMINAL HYSTERECTOMY  00  . BILIARY BRUSHING  01/13/2021   Procedure: BILIARY BRUSHING;  Surgeon: Gatha Mayer, MD;  Location: [facility];  Service: Endoscopy;;  . BILIARY STENT PLACEMENT N/A 01/13/2021   Procedure: BILIARY STENT PLACEMENT;  Surgeon: Gatha Mayer, MD;  Location: [facility];  Service: Endoscopy;  Laterality: N/A;  . BIOPSY  01/13/2021   Procedure: BIOPSY;  Surgeon: Gatha Mayer, MD;  Location: [facility] END[facility]COPY;  Service: Endoscopy;;  . COLONOSCOPY  01/2020  . ERCP N/A 01/13/2021   Procedure: ENDOSCOPIC RETROGRADE CHOLANGIOPANCREATOGRAPHY (ERCP);  Surgeon: Gatha Mayer, MD;  Location: [facility];  Service: Endoscopy;  Laterality: N/A;  . KNEE SURGERY Right 96,98,00,02,05,2014  . NASAL SINUS SURGERY    . NOSE SURGERY     [id number]  . SPHINCTEROTOMY  01/13/2021   Procedure: SPHINCTEROTOMY;  Surgeon: Gatha Mayer, MD;  Location: [facility];  Service: Endoscopy;;  . WRIST SURGERY Right [phone]    Prior to Admission medications   Medication Sig Start Date End Date Taking? Authorizing Provider  azelastine (ASTELIN) 0.1 % nasal spray Place 2 sprays into both nostrils 2 (two) times daily. Patient taking differently: Place 2 sprays into both nostrils 2 (two) times daily as needed for allergies. 07/05/20   Robin Barrack, MD  budesonide (PULMICORT) 0.5 MG/2ML nebulizer solution Take 0.5 mg by nebulization daily as needed (sob/wheezing). 10/20/20   [provider]  cetirizine (ZYRTEC) 10 MG tablet Take 10 mg by mouth daily.    [provider]  clobetasol cream (TEMOVATE) 1.10 % Apply 1 application topically 2 (two) times daily. Patient not taking: Reported on 01/11/2021 10/20/19   Robin Barrack, MD  cyclobenzaprine (FLEXERIL) 5 MG tablet TAKE 1 TABLET(5 MG) BY MOUTH AT BEDTIME AS NEEDED FOR LOWER BACK PAIN OR SEVERE HEADACHE 08/29/20   Narda Amber K, DO  cycloSPORINE (RESTASIS) 0.05 % ophthalmic emulsion INSTILL 1 DROP IN [facility] EYE TWICE DAILY Patient not taking: Reported on 01/11/2021 11/23/19   [provider]  dicyclomine (BENTYL) 10 MG capsule Take 1 capsule (10 mg total) by mouth in the morning and at bedtime. 03/15/20   Noralyn Pick, NP  EPINEPHrine 0.3 mg/0.3 mL IJ SOAJ injection Inject 0.3 mg into the muscle as needed for anaphylaxis. INJECT 0.3 MLS (0.3 MG TOTAL) INTO THE MUSCLE ONCE 12/14/20   Jerline Pain,  Algis Greenhouse, MD  fluticasone (FLONASE) 50 MCG/ACT nasal spray Place 1 spray into both nostrils in the morning and at bedtime.    [provider]  gabapentin (NEURONTIN) 300 MG capsule Take 300 mg by mouth daily as needed (pain). 12/13/20   [provider]  LATUDA 40 MG TABS tablet Take 1 tablet (40 mg total) by mouth daily with breakfast. 10/10/17   Dara Hoyer, PA-C  MOBIC 15 MG tablet Take 1 tablet by mouth daily. 11/14/20   [provider]  ondansetron (ZOFRAN ODT) 8 MG disintegrating tablet Take 1 tablet (8 mg total) by mouth every 8 (eight) hours as needed for nausea or vomiting. 02/08/15   Linton Flemings, MD  prochlorperazine (COMPAZINE) 10 MG tablet TAKE 1 TABLET BY MOUTH EVERY 8 HOURS AS NEEDED( ALONG WITH DICLOFENAC AND DIPHENHYDRAMINE FOR MIGRAINES) 06/27/20   Robin Barrack, MD  valACYclovir (VALTREX) 1000 MG tablet Take 1,000 mg by mouth daily as needed (shingles). 10/20/19   [provider]    Current Facility-Administered Medications  Medication Dose Route Frequency Provider Last  Rate Last Admin  . promethazine (PHENERGAN) injection 12.5 mg  12.5 mg Intramuscular Q6H PRN Antonietta Breach, PA-C   12.5 mg at 01/25/21 6440   Current Outpatient Medications  Medication Sig Dispense Refill  . azelastine (ASTELIN) 0.1 % nasal spray Place 2 sprays into both nostrils 2 (two) times daily. (Patient taking differently: Place 2 sprays into both nostrils 2 (two) times daily as needed for allergies.) 30 mL 12  . budesonide (PULMICORT) 0.5 MG/2ML nebulizer solution Take 0.5 mg by nebulization daily as needed (sob/wheezing).    . cetirizine (ZYRTEC) 10 MG tablet Take 10 mg by mouth daily.    . clobetasol cream (TEMOVATE) 3.47 % Apply 1 application topically 2 (two) times daily. (Patient not taking: Reported on 01/11/2021) 30 g 0  . cyclobenzaprine (FLEXERIL) 5 MG tablet TAKE 1 TABLET(5 MG) BY MOUTH AT BEDTIME AS NEEDED FOR LOWER BACK PAIN OR SEVERE HEADACHE 30 tablet 0  . cycloSPORINE (RESTASIS) 0.05 % ophthalmic emulsion INSTILL 1 DROP IN EACH EYE TWICE DAILY (Patient not taking: Reported on 01/11/2021)    . dicyclomine (BENTYL) 10 MG capsule Take 1 capsule (10 mg total) by mouth in the morning and at bedtime. 60 capsule 0  . EPINEPHrine 0.3 mg/0.3 mL IJ SOAJ injection Inject 0.3 mg into the muscle as needed for anaphylaxis. INJECT 0.3 MLS (0.3 MG TOTAL) INTO THE MUSCLE ONCE 1 each 0  . fluticasone (FLONASE) 50 MCG/ACT nasal spray Place 1 spray into both nostrils in the morning and at bedtime.    . gabapentin (NEURONTIN) 300 MG capsule Take 300 mg by mouth daily as needed (pain).    Marland Kitchen LATUDA 40 MG TABS tablet Take 1 tablet (40 mg total) by mouth daily with breakfast. 90 tablet 0  . MOBIC 15 MG tablet Take 1 tablet by mouth daily.    . ondansetron (ZOFRAN ODT) 8 MG disintegrating tablet Take 1 tablet (8 mg total) by mouth every 8 (eight) hours as needed for nausea or vomiting. 20 tablet 0  . prochlorperazine (COMPAZINE) 10 MG tablet TAKE 1 TABLET BY MOUTH EVERY 8 HOURS AS NEEDED( ALONG WITH  DICLOFENAC AND DIPHENHYDRAMINE FOR MIGRAINES) 30 tablet 0  . valACYclovir (VALTREX) 1000 MG tablet Take 1,000 mg by mouth daily as needed (shingles).      Allergies as of 01/24/2021 - Review Complete 01/24/2021  Allergen Reaction Noted  . Aspirin Nausea And Vomiting and Rash  04/02/2013  . Bee venom Anaphylaxis 11/23/2014  . Penicillins Anaphylaxis 04/02/2013  . Bactrim [sulfamethoxazole-trimethoprim] Nausea And Vomiting 04/02/2013  . Sulfa antibiotics Nausea And Vomiting 11/23/2014    Family History  Problem Relation Age of Onset  . Depression Mother   . High Cholesterol Mother   . Hypertension Mother   . Heart failure Mother   . Hypertension Father   . High blood pressure Maternal Grandfather   . Heart attack Maternal Grandfather   . Breast cancer Maternal Grandmother   . Diabetes Maternal Grandmother   . High blood pressure Maternal Grandmother   . Heart disease Maternal Grandmother   . Breast cancer Sister   . Schizophrenia Brother   . Colon cancer Neg Hx   . Esophageal cancer Neg Hx   . Rectal cancer Neg Hx   . Stomach cancer Neg Hx     Social History   Socioeconomic History  . Marital status: Married    Spouse name: [name]  . Number of children: 1  . Years of education: AA  . Highest education level: Not on file  Occupational History    Employer: [employer]  Tobacco Use  . Smoking status: Never Smoker  . Smokeless tobacco: Never Used  Vaping Use  . Vaping Use: Never used  Substance and Sexual Activity  . Alcohol use: No    Comment: 2-3 drinks per year  . Drug use: No  . Sexual activity: Not on file  Other Topics Concern  . Not on file  Social History Narrative   Pneumonia vaccine 2005   Patient lives at home with wife and 2 sons.    Patient works in [place].    Patient has her AA   Right handed   One story home      Social Determinants of Health   Financial Resource Strain: Not on file  Food  Insecurity: Not on file  Transportation Needs: Not on file  Physical Activity: Not on file  Stress: Not on file  Social Connections: Not on file  Intimate Partner Violence: Not on file    Review of Systems: Gen: Denies fever, sweats or chills. No weight loss.  CV: Denies chest pain, palpitations or edema. Resp: Denies cough, shortness of breath of hemoptysis.  GI: See HPI. GU : Denies urinary burning, blood in urine, increased urinary frequency or incontinence. MS: Denies joint pain, muscles aches or weakness. Derm: Denies rash, itchiness, skin lesions or unhealing ulcers. Psych: + Depression.  Heme: Denies easy bruising, bleeding. Neuro:  Denies headaches, dizziness or paresthesias. Endo:  Denies any problems with DM, thyroid or adrenal function.  Physical Exam: Vital signs in last 24 hours: Temp:  [98.8 F (37.1 C)] 98.8 F (37.1 C) (05/31 2335) Pulse Rate:  [71-92] 77 (06/01 0918) Resp:  [16] 16 (06/01 0918) BP: (134-155)/(67-90) 144/67 (06/01 0918) SpO2:  [97 %-100 %] 99 % (06/01 0918) Weight:  [97.6 kg] 97.6 kg (05/31 2354)   General:  Alert 55 year old female in no acute distress. Head:  Normocephalic and atraumatic. Eyes:  No scleral icterus. Conjunctiva pink. Ears:  Normal auditory acuity. Nose:  No deformity, discharge or lesions. Mouth:  Dentition intact. No ulcers or lesions.  Neck:  Supple. No lymphadenopathy or thyromegaly.  Lungs: Breath sounds clear throughout. Heart: Regular rate and rhythm, no murmurs. Abdomen: Soft, nondistended.  Mild epigastric and right upper quadrant tenderness without rebound or guarding.  Positive bowel sounds all 4 quadrants. Rectal: Deferred.  Musculoskeletal:  Symmetrical without gross deformities.  Pulses:  Normal pulses noted. Extremities:  Without clubbing or edema. Neurologic:  Alert and  oriented x4. No focal deficits.  Skin:  Intact without significant lesions or rashes. Psych:  Alert and cooperative. Normal mood and  affect.  Intake/Output from previous day: No intake/output data recorded. Intake/Output this shift: No intake/output data recorded.  Lab Results: Recent Labs    01/25/21 0012  WBC 12.7*  HGB 13.6  HCT 42.6  PLT 506*   BMET Recent Labs    01/25/21 0012  NA 138  K 3.8  CL 107  CO2 22  GLUCOSE 122*  BUN 17  CREATININE 0.93  CALCIUM 9.6   LFT Recent Labs    01/25/21 0012  PROT 7.9  ALBUMIN 4.1  AST 100*  ALT 137*  ALKPHOS 145*  BILITOT 1.4*   PT/INR No results for input(s): LABPROT, INR in the last 72 hours. Hepatitis Panel No results for input(s): HEPBSAG, HCVAB, HEPAIGM, HEPBIGM in the last 72 hours.    Studies/Results: US Abdomen Limited  Result Date: 01/25/2021 CLINICAL DATA:  Right upper quadrant pain. EXAM: ULTRASOUND ABDOMEN LIMITED RIGHT UPPER QUADRANT COMPARISON:  ERCP 01/13/2021. Ultrasound 01/12/2021. MRI 01/12/2021. CT 01/11/2021. FINDINGS: Gallbladder: No gallstones. Gallbladder wall thickening to 3.9 mm. Negative Murphy sign. Common bile duct: Diameter: 2.1 mm. Interval improvement of intrahepatic biliary distention. Liver: Increased echogenicity consistent fatty infiltration or hepatocellular disease. Echogenic foci within the liver may represent intra biliary air from recent ERCP. A single hyperechoic 3.2 cm mass is noted in the anterior aspect of the liver. This appears stable from prior exam. No other hyperechoic lesions noted on today's exam. Complex cyst measuring 2.3 cm noted in the region of the pancreatic head. Portal vein is patent on color Doppler imaging with normal direction of blood flow towards the liver. Other: None. IMPRESSION: 1. No gallstones. Gallbladder wall thickening to 3.9 mm. Negative Murphy sign. 2. Common bile duct measures 2.1 mm. No common bile duct dilatation noted on today's exam. Interim improvement of intrahepatic biliary ductal dilatation. Intrahepatic biliary ductal air may be present, most likely from recent ERCP. 3.  Increased hepatic echogenicity consistent with fatty infiltration or hepatocellular disease. A single hyperechoic 3.2 cm mass is again noted in the anterior aspect of the liver, no significant change. Again this possibly represents a hemangioma. Reference is made to prior MRI report of 01/12/2021. No other hyperechoic hepatic lesions noted on today's exam. 4. Complex cyst measuring 2.3 cm noted in the region of the pancreatic head. Electronically Signed   By: Marcello Moores  Register   On: 01/25/2021 06:01    IMPRESSION/[name]:  44. 55 year old female with a biliary stricture s/p ERCP with stent placement on 01/13/21. CBD brush cytology suspicious for malignancy presented to the ED 01/24/2021 with N/V, upper abdominal pain and elevated LFTs.  Alk phos level 145. Total bili 1.4.  AST 100.  ALT 137. Normal Lipase. WBC 12.7. She is afebrile. RUQ sono CBD distension and mild intrahepatic biliary ductal dilatation.  -Stat abd xray to assess biliary stent position, if stent is in appropriate position patient will likely be discharged home -Protonix 40 mg p.o. daily -Clear liquid diet  -Antiemetic and pain management per the ED physician -Await recommendations for the oncology tumor board -No plans for EUS at this juncture as abd MRI 5/19 did not show any evidence of a discrete pancreatic mass -Proceed with outpatient appointment with Dr. Michaelle [name] for possible Whipple surgery as scheduled  on February 08, 2021 -Await further recommendations per Dr. Bryan Lemma   2. Liver lesions. CTAP without contrast 01/11/2021 identified a 2.9 cm liver lesion in segment 4A.  Several other tiny subcentimeter low-attenuation lesions were noted in the right and left lobes which possibly represent as tiny cysts. Abdominal MRI 01/12/2021 showed  a left liver lesion was thought to be a sclerosed hemangioma but there was some internal septation noted which was atypical    -AFP with next lab draw -Proceed with [name] in # 1 -Possible eventual  liver biopsy    Noralyn Pick  01/25/2021, 11:31 AM

## 2021-01-25 NOTE — Discharge Instructions (Addendum)
Follow-up with Dr. Zenia Resides in the gastroenterology clinic as discussed.  Start with a clear liquid diet and slowly advance after that as discussed with your gastroenterology doctors.  Return to emergency room if you have any worsening symptoms.

## 2021-01-25 NOTE — Telephone Encounter (Signed)
-----   Message from Gatha Mayer, MD sent at 01/25/2021  7:48 AM EDT ----- I will call her and explain   [facility] - please refer to Dr. Zenia Resides -   Marina Gravel! ----- Message ----- From: Milus Banister, MD Sent: 01/25/2021   7:38 AM EDT To: Jonnie Finner, RN, Ladene Artist, MD, #  Her case was discussed at this morning's GI cancer conference.  She needs referral to consider Whipple surgery.  Dr. Zenia Resides agrees.  Thank you all.

## 2021-01-25 NOTE — Telephone Encounter (Signed)
Patient has been referred to [facility].  Patient will be contacted directly with appointment date and time.  She is currently inpatient.

## 2021-01-25 NOTE — Progress Notes (Signed)
The proposed treatment discussed in conference is for discussion purposes only and is not a binding recommendation.  The patients have not been physically examined, or presented with their treatment options.  Therefore, final treatment plans cannot be decided.   

## 2021-01-25 NOTE — ED Notes (Signed)
Unable to update v/s as ultrasound is at bedside performing an exam. Will update once exam is complete.

## 2021-01-25 NOTE — ED Provider Notes (Signed)
[facility] DEPT Provider Note   CSN: [id number] Arrival date & time: 01/24/21  2326     History Chief Complaint  Patient presents with  . Abdominal Pain    Robin [name] is a 55 y.o. female.  Patient is a 55 year old female who presents with abdominal pain.  She was recently admitted on May 18 for similar upper abdominal pain.  She was found to have common bile duct dilatation and a 2.9 centimeters mass around her pancreas.  This was found to be potentially a hemangioma but will need follow-up imaging/monitoring.  She had a biliary stricture and a stent was placed.  Her LFTs had improved.  She comes back today because she has increased abdominal pain.  She said that she initially was not having a bowel movement since she was discharged from the hospital.  She did have 2 bowel movements yesterday and felt better.  She then ate fried chicken, collard greens and corn.  She subsequently started having some increased pain in her epigastrium and across her upper abdomen.  She had some associated nausea and nonbloody, nonbilious vomiting.  No fevers.  No urinary symptoms.        Past Medical History:  Diagnosis Date  . Asthma   . Bipolar disorder ([place])   . Diverticulosis   . Gestational hypertension   . HA (headache)   . History of borderline personality disorder   . Hypersomnia, persistent 04/29/2013  . IBS (irritable bowel syndrome)   . Memory loss, short term    [name] 04-03-13   . Migraine   . Reflux esophagitis   . Seasonal allergies     Patient Active Problem List   Diagnosis Date Noted  . Abdominal pain   . Biliary obstruction   . Hyperbilirubinemia   . Elevated LFTs 01/11/2021  . Hyperglycemia 12/30/2019  . Dyslipidemia 12/30/2019  . DDD (degenerative disc disease), lumbar 10/10/2017  . Primary narcolepsy without cataplexy 09/03/2017  . Syncope 07/08/2017  . Diverticulosis 05/01/2017  . Stomach ulcer 04/01/2017  . Narcolepsy due to  underlying condition without cataplexy 07/18/2016  . Chronic pansinusitis 01/27/2016  . History of colonoscopy with polypectomy 04/14/2015  . Bipolar I disorder ([place]) 11/23/2014  . Obesity 12/23/2013  . Sleep apnea 11/25/2013  . Migraine 10/28/2013  . Hypersomnia, persistent 04/29/2013  . Memory loss, short term   . Cognitive decline 04/03/2013  . Depression 04/03/2013    Past Surgical History:  Procedure Laterality Date  . ABDOMINAL HYSTERECTOMY  00  . BILIARY BRUSHING  01/13/2021   Procedure: BILIARY BRUSHING;  Surgeon: Gatha Mayer, MD;  Location: [facility];  Service: Endoscopy;;  . BILIARY STENT PLACEMENT N/A 01/13/2021   Procedure: BILIARY STENT PLACEMENT;  Surgeon: Gatha Mayer, MD;  Location: [facility];  Service: Endoscopy;  Laterality: N/A;  . BIOPSY  01/13/2021   Procedure: BIOPSY;  Surgeon: Gatha Mayer, MD;  Location: [facility];  Service: Endoscopy;;  . COLONOSCOPY  01/2020  . ERCP N/A 01/13/2021   Procedure: ENDOSCOPIC RETROGRADE CHOLANGIOPANCREATOGRAPHY (ERCP);  Surgeon: Gatha Mayer, MD;  Location: [facility];  Service: Endoscopy;  Laterality: N/A;  . KNEE SURGERY Right 96,98,[id number],02,05,2014  . NASAL SINUS SURGERY    . NOSE SURGERY     [id number]  . SPHINCTEROTOMY  01/13/2021   Procedure: SPHINCTEROTOMY;  Surgeon: Gatha Mayer, MD;  Location: [facility];  Service: Endoscopy;;  . WRIST SURGERY Right [id number]     OB History   No obstetric history  on file.     Family History  Problem Relation Age of Onset  . Depression Mother   . High Cholesterol Mother   . Hypertension Mother   . Heart failure Mother   . Hypertension Father   . High blood pressure Maternal Grandfather   . Heart attack Maternal Grandfather   . Breast cancer Maternal Grandmother   . Diabetes Maternal Grandmother   . High blood pressure Maternal Grandmother   . Heart disease Maternal Grandmother   . Breast cancer Sister   . Schizophrenia Brother   . Colon cancer Neg  Hx   . Esophageal cancer Neg Hx   . Rectal cancer Neg Hx   . Stomach cancer Neg Hx     Social History   Tobacco Use  . Smoking status: Never Smoker  . Smokeless tobacco: Never Used  Vaping Use  . Vaping Use: Never used  Substance Use Topics  . Alcohol use: No    Comment: 2-3 drinks per year  . Drug use: No    Home Medications Prior to Admission medications   Medication Sig Start Date End Date Taking? Authorizing Provider  azelastine (ASTELIN) 0.1 % nasal spray Place 2 sprays into both nostrils 2 (two) times daily. Patient taking differently: Place 2 sprays into both nostrils 2 (two) times daily as needed for allergies. 07/05/20   [name], MD  budesonide (PULMICORT) 0.5 MG/2ML nebulizer solution Take 0.5 mg by nebulization daily as needed (sob/wheezing). 10/20/20   [provider]  cetirizine (ZYRTEC) 10 MG tablet Take 10 mg by mouth daily.    [provider]  clobetasol cream (TEMOVATE) 1.10 % Apply 1 application topically 2 (two) times daily. Patient not taking: Reported on 01/11/2021 10/20/19   [name], MD  cyclobenzaprine (FLEXERIL) 5 MG tablet TAKE 1 TABLET(5 MG) BY MOUTH AT BEDTIME AS NEEDED FOR LOWER BACK PAIN OR SEVERE HEADACHE 08/29/20   Narda Amber K, DO  cycloSPORINE (RESTASIS) 0.05 % ophthalmic emulsion INSTILL 1 DROP IN [facility] EYE TWICE DAILY Patient not taking: Reported on 01/11/2021 11/23/19   [provider]  dicyclomine (BENTYL) 10 MG capsule Take 1 capsule (10 mg total) by mouth in the morning and at bedtime. 03/15/20   Noralyn Pick, NP  EPINEPHrine 0.3 mg/0.3 mL IJ SOAJ injection Inject 0.3 mg into the muscle as needed for anaphylaxis. INJECT 0.3 MLS (0.3 MG TOTAL) INTO THE MUSCLE ONCE 12/14/20   [name], MD  fluticasone [facility]) 50 MCG/ACT nasal spray Place 1 spray into both nostrils in the morning and at bedtime.    [provider]  gabapentin (NEURONTIN) 300 MG capsule Take 300 mg by mouth daily as  needed (pain). 12/13/20   [provider]  LATUDA 40 MG TABS tablet Take 1 tablet (40 mg total) by mouth daily with breakfast. 10/10/17   Dara Hoyer, PA-C  MOBIC 15 MG tablet Take 1 tablet by mouth daily. 11/14/20   [provider]  ondansetron (ZOFRAN ODT) 8 MG disintegrating tablet Take 1 tablet (8 mg total) by mouth every 8 (eight) hours as needed for nausea or vomiting. 02/08/15   Linton Flemings, MD  prochlorperazine (COMPAZINE) 10 MG tablet TAKE 1 TABLET BY MOUTH EVERY 8 HOURS AS NEEDED( ALONG WITH DICLOFENAC AND DIPHENHYDRAMINE FOR MIGRAINES) 06/27/20   [name], MD  valACYclovir (VALTREX) 1000 MG tablet Take 1,000 mg by mouth daily as needed (shingles). 10/20/19   [provider]    Allergies  Aspirin, Bee venom, Penicillins, Bactrim [sulfamethoxazole-trimethoprim], and Sulfa antibiotics  Review of Systems   Review of Systems  Constitutional: Negative for chills, diaphoresis, fatigue and fever.  HENT: Negative for congestion, rhinorrhea and sneezing.   Eyes: Negative.   Respiratory: Negative for cough, chest tightness and shortness of breath.   Cardiovascular: Negative for chest pain and leg swelling.  Gastrointestinal: Positive for abdominal pain, nausea and vomiting. Negative for blood in stool and diarrhea.  Genitourinary: Negative for difficulty urinating, flank pain, frequency and hematuria.  Musculoskeletal: Negative for arthralgias and back pain.  Skin: Negative for rash.  Neurological: Negative for dizziness, speech difficulty, weakness, numbness and headaches.    Physical Exam Updated Vital Signs BP (!) 156/65   Pulse 84   Temp 98.8 F (37.1 C) (Oral)   Resp 18   Ht 5\' 9"  (1.753 m)   Wt 97.6 kg   SpO2 100%   BMI 31.77 kg/m   Physical Exam Constitutional:      Appearance: She is well-developed.  HENT:     Head: Normocephalic and atraumatic.  Eyes:     Pupils: Pupils are equal, round, and reactive to light.  Cardiovascular:      Rate and Rhythm: Normal rate and regular rhythm.     Heart sounds: Normal heart sounds.  Pulmonary:     Effort: Pulmonary effort is normal. No respiratory distress.     Breath sounds: Normal breath sounds. No wheezing or rales.  Chest:     Chest wall: No tenderness.  Abdominal:     General: Bowel sounds are normal.     Palpations: Abdomen is soft.     Tenderness: There is abdominal tenderness in the epigastric area. There is no guarding or rebound.  Musculoskeletal:        General: Normal range of motion.     Cervical back: Normal range of motion and neck supple.  Lymphadenopathy:     Cervical: No cervical adenopathy.  Skin:    General: Skin is warm and dry.     Findings: No rash.  Neurological:     Mental Status: She is alert and oriented to person, place, and time.     ED Results / Procedures / Treatments   Labs (all labs ordered are listed, but only abnormal results are displayed) Labs Reviewed  COMPREHENSIVE METABOLIC PANEL - Abnormal; Notable for the following components:      Result Value   Glucose, Bld 122 (*)    AST 100 (*)    ALT 137 (*)    Alkaline Phosphatase 145 (*)    Total Bilirubin 1.4 (*)    All other components within normal limits  CBC - Abnormal; Notable for the following components:   WBC 12.7 (*)    Platelets 506 (*)    All other components within normal limits  URINALYSIS, ROUTINE W REFLEX MICROSCOPIC - Abnormal; Notable for the following components:   Leukocytes,Ua TRACE (*)    Bacteria, UA MANY (*)    All other components within normal limits  LIPASE, BLOOD  I-STAT BETA HCG BLOOD, ED ([facility], [facility], [facility] ONLY)    EKG None  Radiology US Abdomen Limited  Result Date: 01/25/2021 CLINICAL DATA:  Right upper quadrant pain. EXAM: ULTRASOUND ABDOMEN LIMITED RIGHT UPPER QUADRANT COMPARISON:  ERCP 01/13/2021. Ultrasound 01/12/2021. MRI 01/12/2021. CT 01/11/2021. FINDINGS: Gallbladder: No gallstones. Gallbladder wall thickening to 3.9 mm. Negative Murphy  sign. Common bile duct: Diameter: 2.1 mm. Interval improvement of intrahepatic biliary distention. Liver: Increased echogenicity consistent  fatty infiltration or hepatocellular disease. Echogenic foci within the liver may represent intra biliary air from recent ERCP. A single hyperechoic 3.2 cm mass is noted in the anterior aspect of the liver. This appears stable from prior exam. No other hyperechoic lesions noted on today's exam. Complex cyst measuring 2.3 cm noted in the region of the pancreatic head. Portal vein is patent on color Doppler imaging with normal direction of blood flow towards the liver. Other: None. IMPRESSION: 1. No gallstones. Gallbladder wall thickening to 3.9 mm. Negative Murphy sign. 2. Common bile duct measures 2.1 mm. No common bile duct dilatation noted on today's exam. Interim improvement of intrahepatic biliary ductal dilatation. Intrahepatic biliary ductal air may be present, most likely from recent ERCP. 3. Increased hepatic echogenicity consistent with fatty infiltration or hepatocellular disease. A single hyperechoic 3.2 cm mass is again noted in the anterior aspect of the liver, no significant change. Again this possibly represents a hemangioma. Reference is made to prior MRI report of 01/12/2021. No other hyperechoic hepatic lesions noted on today's exam. 4. Complex cyst measuring 2.3 cm noted in the region of the pancreatic head. Electronically Signed   By: [place]   On: 01/25/2021 06:01   DG Abd Portable 1V  Result Date: 01/25/2021 CLINICAL DATA:  Abdominal pain. History of biliary obstruction post stent placement. EXAM: PORTABLE ABDOMEN - 1 VIEW COMPARISON:  Right upper quadrant abdominal ultrasound-earlier same day; ERCP-01/14/2019; CT abdomen pelvis-01/11/2021 FINDINGS: Nonobstructive bowel gas pattern.  Moderate colonic stool burden. A biliary stent overlies expected location of the CBD. Minimal amount of expected pneumobilia. No definite pneumoperitoneum,  pneumatosis or portal venous gas. No acute osseous abnormalities. IMPRESSION: 1. Moderate colonic stool burden without evidence of enteric obstruction. 2. Biliary stent overlies expected location of the CBD with expected pneumobilia. Electronically Signed   By: Sandi Mariscal M.D.   On: 01/25/2021 11:03    Procedures Procedures   Medications Ordered in ED Medications  promethazine (PHENERGAN) injection 12.5 mg (12.5 mg Intramuscular Given 01/25/21 0524)  HYDROmorphone (DILAUDID) injection 1 mg (1 mg Intramuscular Given 01/25/21 0524)  pantoprazole (PROTONIX) injection 40 mg (40 mg Intravenous Given 01/25/21 0816)    ED Course  I have reviewed the triage vital signs and the nursing notes.  Pertinent labs & imaging results that were available during my care of the patient were reviewed by me and considered in my medical decision making (see chart for details).    MDM Rules/Calculators/A&P                          Patient presents with upper abdominal pain.  She has a history of recent biliary stent placed.  She is feeling better in the ED.  She was given Protonix.  Her labs show a mild increase in her AST but decrease in her ALT.  Ultrasound shows some mild gallbladder wall thickening.  She does  have a mildly increased white count.  No fever.  No particular pain over her gallbladder which would make cholecystitis less likely.  GI has seen the patient and feels that she can be discharged at this point.  She has follow-up appointments with surgery and GI.  Was instructed to use a clear liquid diet and slowly advance after that.  Return precautions were given. Final Clinical Impression(s) / ED Diagnoses Final diagnoses:  Epigastric pain    Rx / DC Orders ED Discharge Orders    None  Malvin Johns, MD 01/25/21 1501

## 2021-01-27 ENCOUNTER — Telehealth: Payer: Self-pay | Admitting: Nurse Practitioner

## 2021-01-27 ENCOUNTER — Ambulatory Visit: Payer: 59 | Admitting: Nurse Practitioner

## 2021-01-27 MED ORDER — HYDROCODONE-ACETAMINOPHEN 5-325 MG PO TABS: 1.0000 | ORAL_TABLET | Freq: Four times a day (QID) | ORAL | 0 refills | Status: DC | PRN

## 2021-01-27 MED ORDER — ONDANSETRON 8 MG PO TBDP: 8.0000 mg | ORAL_TABLET | Freq: Three times a day (TID) | ORAL | 1 refills | Status: DC | PRN

## 2021-01-27 NOTE — Telephone Encounter (Signed)
Meds ordered this encounter  Medications  . HYDROcodone-acetaminophen (NORCO/VICODIN) 5-325 MG tablet    Sig: Take 1 tablet by mouth every 6 (six) hours as needed for severe pain.    Dispense:  15 tablet    Refill:  0   Rx sent

## 2021-01-27 NOTE — Telephone Encounter (Signed)
Dr. Carlean Purl please advise about pain until sees surgery next week?

## 2021-01-27 NOTE — Telephone Encounter (Signed)
Patient notified refill also sent for zofran.  Patient has appointment with surgery on Tuesday of next week

## 2021-02-02 NOTE — Telephone Encounter (Signed)
Patient is scheduled with [facility]. Allen on 02/08/21

## 2021-02-08 ENCOUNTER — Other Ambulatory Visit: Payer: Self-pay

## 2021-02-08 ENCOUNTER — Other Ambulatory Visit: Payer: Self-pay | Admitting: Surgery

## 2021-02-08 ENCOUNTER — Encounter (HOSPITAL_COMMUNITY): Payer: Self-pay | Admitting: Surgery

## 2021-02-08 ENCOUNTER — Inpatient Hospital Stay (HOSPITAL_COMMUNITY)
Admission: AD | Admit: 2021-02-08 | Discharge: 2021-02-10 | DRG: 435 | Disposition: A | Discharge status: Home / Self Care | Payer: 59 | Source: Ambulatory Visit | Attending: Surgery | Admitting: Surgery

## 2021-02-08 ENCOUNTER — Other Ambulatory Visit (HOSPITAL_COMMUNITY): Payer: Self-pay | Admitting: Surgery

## 2021-02-08 ENCOUNTER — Observation Stay (HOSPITAL_COMMUNITY): Payer: 59

## 2021-02-08 ENCOUNTER — Ambulatory Visit: Payer: Self-pay | Admitting: Surgery

## 2021-02-08 DIAGNOSIS — C221 Intrahepatic bile duct carcinoma: Secondary | ICD-10-CM

## 2021-02-08 DIAGNOSIS — K8689 Other specified diseases of pancreas: Secondary | ICD-10-CM | POA: Diagnosis present

## 2021-02-08 DIAGNOSIS — R109 Unspecified abdominal pain: Secondary | ICD-10-CM | POA: Diagnosis present

## 2021-02-08 DIAGNOSIS — Z20822 Contact with and (suspected) exposure to covid-19: Secondary | ICD-10-CM | POA: Diagnosis present

## 2021-02-08 DIAGNOSIS — F419 Anxiety disorder, unspecified: Secondary | ICD-10-CM | POA: Diagnosis present

## 2021-02-08 DIAGNOSIS — G4733 Obstructive sleep apnea (adult) (pediatric): Secondary | ICD-10-CM | POA: Diagnosis present

## 2021-02-08 DIAGNOSIS — K589 Irritable bowel syndrome without diarrhea: Secondary | ICD-10-CM | POA: Diagnosis present

## 2021-02-08 DIAGNOSIS — Z4689 Encounter for fitting and adjustment of other specified devices: Secondary | ICD-10-CM

## 2021-02-08 DIAGNOSIS — Z79899 Other long term (current) drug therapy: Secondary | ICD-10-CM

## 2021-02-08 DIAGNOSIS — Z7951 Long term (current) use of inhaled steroids: Secondary | ICD-10-CM

## 2021-02-08 DIAGNOSIS — Z6824 Body mass index (BMI) 24.0-24.9, adult: Secondary | ICD-10-CM

## 2021-02-08 DIAGNOSIS — E43 Unspecified severe protein-calorie malnutrition: Secondary | ICD-10-CM | POA: Insufficient documentation

## 2021-02-08 DIAGNOSIS — F603 Borderline personality disorder: Secondary | ICD-10-CM | POA: Diagnosis present

## 2021-02-08 DIAGNOSIS — Z882 Allergy status to sulfonamides status: Secondary | ICD-10-CM

## 2021-02-08 DIAGNOSIS — Z883 Allergy status to other anti-infective agents status: Secondary | ICD-10-CM

## 2021-02-08 DIAGNOSIS — Z886 Allergy status to analgesic agent status: Secondary | ICD-10-CM

## 2021-02-08 DIAGNOSIS — Z91030 Bee allergy status: Secondary | ICD-10-CM

## 2021-02-08 DIAGNOSIS — F319 Bipolar disorder, unspecified: Secondary | ICD-10-CM | POA: Diagnosis present

## 2021-02-08 DIAGNOSIS — Z880 Allergy status to penicillin: Secondary | ICD-10-CM

## 2021-02-08 DIAGNOSIS — K831 Obstruction of bile duct: Secondary | ICD-10-CM | POA: Diagnosis present

## 2021-02-08 DIAGNOSIS — J302 Other seasonal allergic rhinitis: Secondary | ICD-10-CM | POA: Diagnosis present

## 2021-02-08 DIAGNOSIS — C250 Malignant neoplasm of head of pancreas: Secondary | ICD-10-CM | POA: Diagnosis present

## 2021-02-08 DIAGNOSIS — Z90710 Acquired absence of both cervix and uterus: Secondary | ICD-10-CM

## 2021-02-08 LAB — BASIC METABOLIC PANEL
Anion gap: 10 (ref 5–15)
BUN: 7 mg/dL (ref 6–20)
CO2: 27 mmol/L (ref 22–32)
Calcium: 9.3 mg/dL (ref 8.9–10.3)
Chloride: 103 mmol/L (ref 98–111)
Creatinine, Ser: 1.08 mg/dL — ABNORMAL HIGH (ref 0.44–1.00)
GFR, Estimated: >60 mL/min (ref >60)
Glucose, Bld: 121 mg/dL — ABNORMAL HIGH (ref 70–99)
Potassium: 3.7 mmol/L (ref 3.5–5.1)
Sodium: 140 mmol/L (ref 135–145)

## 2021-02-08 LAB — CBC
HCT: 39.5 % (ref 36.0–46.0)
Hemoglobin: 12.5 g/dL (ref 12.0–15.0)
MCH: 27.6 pg (ref 26.0–34.0)
MCHC: 31.6 g/dL (ref 30.0–36.0)
MCV: 87.2 fL (ref 80.0–100.0)
Platelets: 294 10*3/uL (ref 150–400)
RBC: 4.53 MIL/uL (ref 3.87–5.11)
RDW: 13.4 % (ref 11.5–15.5)
WBC: 16.7 10*3/uL — ABNORMAL HIGH (ref 4.0–10.5)
nRBC: 0 % (ref 0.0–0.2)

## 2021-02-08 LAB — PROTIME-INR
INR: 1 (ref 0.8–1.2)
Prothrombin Time: 13.6 seconds (ref 11.4–15.2)

## 2021-02-08 MED ORDER — IOHEXOL 300 MG/ML  SOLN
100.0000 mL | Freq: Once | INTRAMUSCULAR | Status: AC | PRN
  Administered 2021-02-08: 100 mL via INTRAVENOUS

## 2021-02-08 MED ORDER — LURASIDONE HCL 40 MG PO TABS
40.0000 mg | ORAL_TABLET | Freq: Every day | ORAL | Status: DC
Start: 2021-02-09 — End: 2021-02-10
  Administered 2021-02-10: 40 mg via ORAL
  Filled 2021-02-08 (×2): qty 1

## 2021-02-08 MED ORDER — ONDANSETRON 4 MG PO TBDP: 4.0000 mg | ORAL_TABLET | Freq: Four times a day (QID) | ORAL | Status: DC | PRN

## 2021-02-08 MED ORDER — PROCHLORPERAZINE MALEATE 10 MG PO TABS
10.0000 mg | ORAL_TABLET | Freq: Four times a day (QID) | ORAL | Status: DC | PRN
  Filled 2021-02-08: qty 1

## 2021-02-08 MED ORDER — BUDESONIDE 0.5 MG/2ML IN SUSP: 0.5000 mg | Freq: Every day | RESPIRATORY_TRACT | Status: DC | PRN

## 2021-02-08 MED ORDER — HYDROMORPHONE HCL 1 MG/ML IJ SOLN
0.5000 mg | INTRAMUSCULAR | Status: DC | PRN
  Administered 2021-02-08 – 2021-02-10 (×7): 0.5 mg via INTRAVENOUS
  Filled 2021-02-08 (×7): qty 1

## 2021-02-08 MED ORDER — ONDANSETRON HCL 4 MG/2ML IJ SOLN
4.0000 mg | Freq: Four times a day (QID) | INTRAMUSCULAR | Status: DC | PRN
  Administered 2021-02-08 – 2021-02-09 (×2): 4 mg via INTRAVENOUS
  Filled 2021-02-08 (×2): qty 2

## 2021-02-08 MED ORDER — CYCLOBENZAPRINE HCL 5 MG PO TABS: 5.0000 mg | ORAL_TABLET | Freq: Three times a day (TID) | ORAL | Status: DC | PRN

## 2021-02-08 MED ORDER — SODIUM CHLORIDE 0.9 % IV SOLN: INTRAVENOUS | Status: DC

## 2021-02-08 MED ORDER — OXYCODONE HCL 5 MG PO TABS
5.0000 mg | ORAL_TABLET | ORAL | Status: DC | PRN
  Administered 2021-02-09 – 2021-02-10 (×2): 10 mg via ORAL
  Filled 2021-02-08 (×2): qty 2

## 2021-02-08 MED ORDER — PROCHLORPERAZINE EDISYLATE 10 MG/2ML IJ SOLN: 5.0000 mg | Freq: Four times a day (QID) | INTRAMUSCULAR | Status: DC | PRN

## 2021-02-08 NOTE — Plan of Care (Signed)
Patient arrived on unit from home, alert, oriented and stable. Complaints of pain to abdominal area and left flank. Patient settled into room, admission assessment completed. Will monitor according to orders and plan of care.

## 2021-02-08 NOTE — H&P (Signed)
History of Present Illness (Robin [name] L. Zenia Resides MD; 02/08/2021 11:42 AM) Patient words: HPI: Ms. Robin [name] is a 55 yo female who was referred with a biliary stricture. She presented in mid-May with epigastric abdominal pain and was found to have an elevated bilirubin of 6.7. A non-contrast CT scan showed diffuse biliary ductal dilation. A follow MRCP showed a biliary stricture but no pancreatic mass. She was also noted to have several benign-appearing liver lesions. She underwent an ERCP with Dr. Carlean Purl on 5/20, which showed a distal common bile duct stricture. Brushings were very suspicious for malignant cells. A plastic stent was placed. She was referred to me to discuss surgery. Her bilirubin downtrended appropriately after stent placement. She has continued to have severe upper abdominal pain which she feels is getting worse. It radiates around both sides to the back. She takes Aleve for the pain and reports oxycodone has not helped. She also has frequent vomiting, although she has still been able to eat and is trying to drink protein shakes daily. Zofran has not helped with her nausea. She is here with her wife today in clinic, who reports the patient has lost about 20 pounds in the last few months.  CEA: <0.5 Ca19-9: 72  PMH: bipolar disorder, migraine, seasonal allergies  PSH: knee surgery, wrist surgery, hysterectomy  FHx: Sister and grandmother had breast cancer.  Social: Nonsmoker, very rare EtOH use.  The patient is a 55 year old female.   Past Surgical History (Robin Little, CNA; 02/08/2021 9:37 AM) Anal Fissure Repair   Hysterectomy (not due to cancer) - Complete   Knee Surgery   Bilateral.  Diagnostic Studies History (Robin Little, CNA; 02/08/2021 9:37 AM) Colonoscopy   within last year Mammogram   1-3 years ago Pap Smear   1-5 years ago  Allergies (Robin Little, CNA; 02/08/2021 9:41 AM) Aspirin *ANALGESICS - NonNarcotic*   Penicillin G Pot in Dextrose *PENICILLINS*    Bactrim *ANTI-INFECTIVE AGENTS - MISC.*   Sulfa Antibiotics    Social History (Robin Little, CNA; 02/08/2021 9:37 AM) Alcohol use   Occasional alcohol use. Caffeine use   Tea. No drug use   Tobacco use   Never smoker.  Family History (Robin Little, CNA; 02/08/2021 9:37 AM) Arthritis   Father, Mother. Colon Cancer   Father. Depression   Mother. Heart Disease   Mother. Hypertension   Father, Mother. Migraine Headache   Mother.  Pregnancy / Birth History (Robin Little, CNA; 02/08/2021 9:37 AM) Age at menarche   79 years. Contraceptive History   Oral contraceptives. Gravida   1 Irregular periods   Length (months) of breastfeeding   3-6 Maternal age   79-35 Para   1  Other Problems (Robin Little, CNA; 02/08/2021 9:37 AM) Anxiety Disorder   Arthritis   Asthma   Back Pain   Chest pain   Depression   Diverticulosis   Gastric Ulcer   Gastroesophageal Reflux Disease   Hemorrhoids   Hypercholesterolemia   Migraine Headache      Review of Systems (Robin Little CNA; 02/08/2021 9:37 AM) General Present- Appetite Loss, Chills, Fatigue and Weight Loss. Not Present- Fever, Night Sweats and Weight Gain. Skin Present- Change in Wart/Mole and Hives. Not Present- Dryness, Jaundice, New Lesions, Non-Healing Wounds, Rash and Ulcer. HEENT Present- Hoarseness, Sore Throat and Yellow Eyes. Not Present- Earache, Hearing Loss, Nose Bleed, Oral Ulcers, Ringing in the Ears, Seasonal Allergies, Sinus Pain, Visual Disturbances and Wears glasses/contact lenses. Respiratory Not Present- Bloody sputum, Chronic Cough, Difficulty Breathing, Snoring  and Wheezing. Breast Not Present- Breast Mass, Breast Pain, Nipple Discharge and Skin Changes. Cardiovascular Not Present- Chest Pain, Difficulty Breathing Lying Down, Leg Cramps, Palpitations, Rapid Heart Rate, Shortness of Breath and Swelling of Extremities. Gastrointestinal Present- Abdominal Pain, Change in Bowel Habits, Constipation, Gets  full quickly at meals, Nausea and Vomiting. Not Present- Bloating, Bloody Stool, Chronic diarrhea, Difficulty Swallowing, Excessive gas, Hemorrhoids, Indigestion and Rectal Pain. Female Genitourinary Not Present- Frequency, Nocturia, Painful Urination, Pelvic Pain and Urgency. Musculoskeletal Present- Back Pain. Not Present- Joint Pain, Joint Stiffness, Muscle Pain, Muscle Weakness and Swelling of Extremities. Neurological Present- Headaches. Not Present- Decreased Memory, Fainting, Numbness, Seizures, Tingling, Tremor, Trouble walking and Weakness. Psychiatric Present- Anxiety, Bipolar and Depression. Not Present- Change in Sleep Pattern, Fearful and Frequent crying. Endocrine Not Present- Cold Intolerance, Excessive Hunger, Hair Changes, Heat Intolerance, Hot flashes and New Diabetes. Hematology Not Present- Blood Thinners, Easy Bruising, Excessive bleeding, Gland problems, HIV and Persistent Infections.  Vitals (Robin Little CNA; 02/08/2021 9:41 AM) 02/08/2021 9:41 AM Weight: 203.13 lb   Height: 69 in  Body Surface Area: 2.08 m   Body Mass Index: 30 kg/m   Temp.: 98.4 F    Pulse: 97 (Regular)    P.OX: 99% (Room air) BP: 148/90(Sitting, Left Arm, Standard)       Physical Exam (Robin [name] L. Zenia Resides MD; 02/08/2021 11:41 AM) The physical exam findings are as follows: Note:  Constitutional: Conversant, appears uncomfortable Neuro: alert and oriented; cranial nerves grossly in tact; no focal deficits Eyes: Moist conjunctiva; anicteric sclerae; extraocular movements in tact Neck: Trachea midline Lungs: Normal respiratory effort; lungs clear to auscultation bilaterally; symmetric chest wall expansion CV: Regular rate and rhythm; no murmurs; no pitting edema GI: Abdomen soft, nondistended, well-healed umbilical scar (patient is unsure which surgery this is from), mildly tender in epigastric area; no masses or organomegaly MSK: Normal gait and station; no clubbing/cyanosis Psychiatric:  Appropriate affect; alert and oriented 3    Assessment & Plan (Robin L. Zenia Resides MD; 02/08/2021 11:46 AM) CHOLANGIOCARCINOMA (C22.1) Story: 56 yo female with a malignant biliary stricture. 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 I personally reviewed her CT scan and MRCP. There is significant biliary duct dilation, but no dilation of the pancreatic duct and no obvious pancreatic mass. No evidence of metastatic disease within the abdomen. Vasculature is difficult to assess on the MRI even with contrast. Surgical resection via a Whipple is the primary treatment for a distal cholangiocarcinoma. I discussed the details of a Whipple with the patient and her wife today, including the risks of bleeding, infection, pancreatic leak, bile leak, and delayed gastric emptying. This is the only potentially curative treatment for her disease. She will need a chest CT to complete her staging workup. In addition I am very concerned about the degree of pain she is having, which seems to have worsened in the last few weeks. I will obtain a pancreas protocol CT abd/pelvis both for surgical planning and to rule out post-ERCP pancreatitis (although her lipase after the procedure was normal). If she has evidence of acute pancreatitis on imaging her surgery will need to be delayed until resolution. Will also check labs today including lipase.  Addendum Note (Robin [name] L. Zenia Resides MD; 02/08/2021 6:39 PM) Labs reviewed. Bilirubin is up to 2.8 (from 1.4 at hospital discharge). Given pain and rising  bilirubin, will direct admit for workup of possible stent occlusion. Plan to obtain CT scan while admitted. If bilirubin continues to rise in am, will consult GI for ERCP.  Robin Birks, MD [facility] Surgery General, Hepatobiliary and Pancreatic  Surgery 02/08/21 6:47 PM

## 2021-02-08 NOTE — H&P (View-Only) (Signed)
History of Present Illness (Jhamal Plucinski L. Zenia Resides MD; 02/08/2021 11:42 AM) Patient words: HPI: Robin [name] is a 55 yo female who was referred with a biliary stricture. She presented in mid-May with epigastric abdominal pain and was found to have an elevated bilirubin of 6.7. A non-contrast CT scan showed diffuse biliary ductal dilation. A follow MRCP showed a biliary stricture but no pancreatic mass. She was also noted to have several benign-appearing liver lesions. She underwent an ERCP with Dr. Carlean Purl on 5/20, which showed a distal common bile duct stricture. Brushings were very suspicious for malignant cells. A plastic stent was placed. She was referred to me to discuss surgery. Her bilirubin downtrended appropriately after stent placement. She has continued to have severe upper abdominal pain which she feels is getting worse. It radiates around both sides to the back. She takes Aleve for the pain and reports oxycodone has not helped. She also has frequent vomiting, although she has still been able to eat and is trying to drink protein shakes daily. Zofran has not helped with her nausea. She is here with her wife today in clinic, who reports the patient has lost about 20 pounds in the last few months.  CEA: <0.5 Ca19-9: 72  PMH: bipolar disorder, migraine, seasonal allergies  PSH: knee surgery, wrist surgery, hysterectomy  FHx: Sister and grandmother had breast cancer.  Social: Nonsmoker, very rare EtOH use.  The patient is a 55 year old female.   Past Surgical History (Charmella Little, CNA; 02/08/2021 9:37 AM) Anal Fissure Repair   Hysterectomy (not due to cancer) - Complete   Knee Surgery   Bilateral.  Diagnostic Studies History (Charmella Little, CNA; 02/08/2021 9:37 AM) Colonoscopy   within last year Mammogram   1-3 years ago Pap Smear   1-5 years ago  Allergies (Charmella Little, CNA; 02/08/2021 9:41 AM) Aspirin *ANALGESICS - NonNarcotic*   Penicillin G Pot in Dextrose *PENICILLINS*    Bactrim *ANTI-INFECTIVE AGENTS - MISC.*   Sulfa Antibiotics    Social History (Charmella Little, CNA; 02/08/2021 9:37 AM) Alcohol use   Occasional alcohol use. Caffeine use   Tea. No drug use   Tobacco use   Never smoker.  Family History (Charmella Little, CNA; 02/08/2021 9:37 AM) Arthritis   Father, Mother. Colon Cancer   Father. Depression   Mother. Heart Disease   Mother. Hypertension   Father, Mother. Migraine Headache   Mother.  Pregnancy / Birth History (Charmella Little, CNA; 02/08/2021 9:37 AM) Age at menarche   45 years. Contraceptive History   Oral contraceptives. Gravida   1 Irregular periods   Length (months) of breastfeeding   3-6 Maternal age   49-35 Para   1  Other Problems (Charmella Little, CNA; 02/08/2021 9:37 AM) Anxiety Disorder   Arthritis   Asthma   Back Pain   Chest pain   Depression   Diverticulosis   Gastric Ulcer   Gastroesophageal Reflux Disease   Hemorrhoids   Hypercholesterolemia   Migraine Headache      Review of Systems (Charmella Little CNA; 02/08/2021 9:37 AM) General Present- Appetite Loss, Chills, Fatigue and Weight Loss. Not Present- Fever, Night Sweats and Weight Gain. Skin Present- Change in Wart/Mole and Hives. Not Present- Dryness, Jaundice, New Lesions, Non-Healing Wounds, Rash and Ulcer. HEENT Present- Hoarseness, Sore Throat and Yellow Eyes. Not Present- Earache, Hearing Loss, Nose Bleed, Oral Ulcers, Ringing in the Ears, Seasonal Allergies, Sinus Pain, Visual Disturbances and Wears glasses/contact lenses. Respiratory Not Present- Bloody sputum, Chronic Cough, Difficulty Breathing, Snoring  and Wheezing. Breast Not Present- Breast Mass, Breast Pain, Nipple Discharge and Skin Changes. Cardiovascular Not Present- Chest Pain, Difficulty Breathing Lying Down, Leg Cramps, Palpitations, Rapid Heart Rate, Shortness of Breath and Swelling of Extremities. Gastrointestinal Present- Abdominal Pain, Change in Bowel Habits, Constipation, Gets  full quickly at meals, Nausea and Vomiting. Not Present- Bloating, Bloody Stool, Chronic diarrhea, Difficulty Swallowing, Excessive gas, Hemorrhoids, Indigestion and Rectal Pain. Female Genitourinary Not Present- Frequency, Nocturia, Painful Urination, Pelvic Pain and Urgency. Musculoskeletal Present- Back Pain. Not Present- Joint Pain, Joint Stiffness, Muscle Pain, Muscle Weakness and Swelling of Extremities. Neurological Present- Headaches. Not Present- Decreased Memory, Fainting, Numbness, Seizures, Tingling, Tremor, Trouble walking and Weakness. Psychiatric Present- Anxiety, Bipolar and Depression. Not Present- Change in Sleep Pattern, Fearful and Frequent crying. Endocrine Not Present- Cold Intolerance, Excessive Hunger, Hair Changes, Heat Intolerance, Hot flashes and New Diabetes. Hematology Not Present- Blood Thinners, Easy Bruising, Excessive bleeding, Gland problems, HIV and Persistent Infections.  Vitals (Charmella Little CNA; 02/08/2021 9:41 AM) 02/08/2021 9:41 AM Weight: 203.13 lb   Height: 69 in  Body Surface Area: 2.08 m   Body Mass Index: 30 kg/m   Temp.: 98.4 F    Pulse: 97 (Regular)    P.OX: 99% (Room air) BP: 148/90(Sitting, Left Arm, Standard)       Physical Exam (Gustavo Dispenza L. Zenia Resides MD; 02/08/2021 11:41 AM) The physical exam findings are as follows: Note:  Constitutional: Conversant, appears uncomfortable Neuro: alert and oriented; cranial nerves grossly in tact; no focal deficits Eyes: Moist conjunctiva; anicteric sclerae; extraocular movements in tact Neck: Trachea midline Lungs: Normal respiratory effort; lungs clear to auscultation bilaterally; symmetric chest wall expansion CV: Regular rate and rhythm; no murmurs; no pitting edema GI: Abdomen soft, nondistended, well-healed umbilical scar (patient is unsure which surgery this is from), mildly tender in epigastric area; no masses or organomegaly MSK: Normal gait and station; no clubbing/cyanosis Psychiatric:  Appropriate affect; alert and oriented 3    Assessment & Plan ([place] L. Zenia Resides MD; 02/08/2021 11:46 AM) CHOLANGIOCARCINOMA (C22.1) Story: 55 yo female with a malignant biliary stricture. 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 I personally reviewed her CT scan and MRCP. There is significant biliary duct dilation, but no dilation of the pancreatic duct and no obvious pancreatic mass. No evidence of metastatic disease within the abdomen. Vasculature is difficult to assess on the MRI even with contrast. Surgical resection via a Whipple is the primary treatment for a distal cholangiocarcinoma. I discussed the details of a Whipple with the patient and her wife today, including the risks of bleeding, infection, pancreatic leak, bile leak, and delayed gastric emptying. This is the only potentially curative treatment for her disease. She will need a chest CT to complete her staging workup. In addition I am very concerned about the degree of pain she is having, which seems to have worsened in the last few weeks. I will obtain a pancreas protocol CT abd/pelvis both for surgical planning and to rule out post-ERCP pancreatitis (although her lipase after the procedure was normal). If she has evidence of acute pancreatitis on imaging her surgery will need to be delayed until resolution. Will also check labs today including lipase.  Addendum Note (Aleana Fifita L. Zenia Resides MD; 02/08/2021 6:39 PM) Labs reviewed. Bilirubin is up to 2.8 (from 1.4 at hospital discharge). Given pain and rising  bilirubin, will direct admit for workup of possible stent occlusion. Plan to obtain CT scan while admitted. If bilirubin continues to rise in am, will consult GI for ERCP.  Michaelle Birks, MD [facility] Surgery General, Hepatobiliary and Pancreatic  Surgery 02/08/21 6:47 PM

## 2021-02-09 ENCOUNTER — Observation Stay (HOSPITAL_COMMUNITY): Payer: 59 | Admitting: Anesthesiology

## 2021-02-09 ENCOUNTER — Encounter (HOSPITAL_COMMUNITY): Payer: Self-pay | Admitting: Surgery

## 2021-02-09 ENCOUNTER — Encounter (HOSPITAL_COMMUNITY)
Admission: AD | Disposition: A | Discharge status: Home / Self Care | Payer: Self-pay | Source: Ambulatory Visit | Attending: Surgery

## 2021-02-09 ENCOUNTER — Observation Stay (HOSPITAL_COMMUNITY): Payer: 59

## 2021-02-09 DIAGNOSIS — F319 Bipolar disorder, unspecified: Secondary | ICD-10-CM | POA: Diagnosis present

## 2021-02-09 DIAGNOSIS — C250 Malignant neoplasm of head of pancreas: Secondary | ICD-10-CM | POA: Diagnosis present

## 2021-02-09 DIAGNOSIS — Z9889 Other specified postprocedural states: Secondary | ICD-10-CM | POA: Diagnosis not present

## 2021-02-09 DIAGNOSIS — Z4659 Encounter for fitting and adjustment of other gastrointestinal appliance and device: Secondary | ICD-10-CM | POA: Diagnosis not present

## 2021-02-09 DIAGNOSIS — C221 Intrahepatic bile duct carcinoma: Secondary | ICD-10-CM | POA: Diagnosis present

## 2021-02-09 DIAGNOSIS — Z6824 Body mass index (BMI) 24.0-24.9, adult: Secondary | ICD-10-CM | POA: Diagnosis not present

## 2021-02-09 DIAGNOSIS — K831 Obstruction of bile duct: Secondary | ICD-10-CM | POA: Diagnosis present

## 2021-02-09 DIAGNOSIS — K838 Other specified diseases of biliary tract: Secondary | ICD-10-CM | POA: Diagnosis not present

## 2021-02-09 DIAGNOSIS — R935 Abnormal findings on diagnostic imaging of other abdominal regions, including retroperitoneum: Secondary | ICD-10-CM

## 2021-02-09 DIAGNOSIS — Z91030 Bee allergy status: Secondary | ICD-10-CM | POA: Diagnosis not present

## 2021-02-09 DIAGNOSIS — F419 Anxiety disorder, unspecified: Secondary | ICD-10-CM | POA: Diagnosis present

## 2021-02-09 DIAGNOSIS — E43 Unspecified severe protein-calorie malnutrition: Secondary | ICD-10-CM | POA: Insufficient documentation

## 2021-02-09 DIAGNOSIS — K8689 Other specified diseases of pancreas: Secondary | ICD-10-CM

## 2021-02-09 DIAGNOSIS — J302 Other seasonal allergic rhinitis: Secondary | ICD-10-CM | POA: Diagnosis present

## 2021-02-09 DIAGNOSIS — R945 Abnormal results of liver function studies: Secondary | ICD-10-CM

## 2021-02-09 DIAGNOSIS — K589 Irritable bowel syndrome without diarrhea: Secondary | ICD-10-CM | POA: Diagnosis present

## 2021-02-09 DIAGNOSIS — Z882 Allergy status to sulfonamides status: Secondary | ICD-10-CM | POA: Diagnosis not present

## 2021-02-09 DIAGNOSIS — Z883 Allergy status to other anti-infective agents status: Secondary | ICD-10-CM | POA: Diagnosis not present

## 2021-02-09 DIAGNOSIS — K2970 Gastritis, unspecified, without bleeding: Secondary | ICD-10-CM

## 2021-02-09 DIAGNOSIS — Z880 Allergy status to penicillin: Secondary | ICD-10-CM | POA: Diagnosis not present

## 2021-02-09 DIAGNOSIS — Z20822 Contact with and (suspected) exposure to covid-19: Secondary | ICD-10-CM | POA: Diagnosis present

## 2021-02-09 DIAGNOSIS — R7989 Other specified abnormal findings of blood chemistry: Secondary | ICD-10-CM | POA: Diagnosis not present

## 2021-02-09 DIAGNOSIS — Z886 Allergy status to analgesic agent status: Secondary | ICD-10-CM | POA: Diagnosis not present

## 2021-02-09 DIAGNOSIS — Z90710 Acquired absence of both cervix and uterus: Secondary | ICD-10-CM | POA: Diagnosis not present

## 2021-02-09 DIAGNOSIS — Z7951 Long term (current) use of inhaled steroids: Secondary | ICD-10-CM | POA: Diagnosis not present

## 2021-02-09 DIAGNOSIS — G4733 Obstructive sleep apnea (adult) (pediatric): Secondary | ICD-10-CM | POA: Diagnosis present

## 2021-02-09 DIAGNOSIS — Z79899 Other long term (current) drug therapy: Secondary | ICD-10-CM | POA: Diagnosis not present

## 2021-02-09 DIAGNOSIS — F603 Borderline personality disorder: Secondary | ICD-10-CM | POA: Diagnosis present

## 2021-02-09 HISTORY — PX: BIOPSY: SHX5522

## 2021-02-09 HISTORY — PX: BILIARY DILATION: SHX6850

## 2021-02-09 HISTORY — PX: FINE NEEDLE ASPIRATION: SHX5430

## 2021-02-09 HISTORY — PX: ERCP: SHX5425

## 2021-02-09 HISTORY — PX: STENT REMOVAL: SHX6421

## 2021-02-09 HISTORY — PX: ESOPHAGOGASTRODUODENOSCOPY (EGD) WITH PROPOFOL: SHX5813

## 2021-02-09 HISTORY — PX: EUS: SHX5427

## 2021-02-09 HISTORY — PX: BILIARY STENT PLACEMENT: SHX5538

## 2021-02-09 LAB — COMPREHENSIVE METABOLIC PANEL
ALT: 202 U/L — ABNORMAL HIGH (ref 0–44)
AST: 142 U/L — ABNORMAL HIGH (ref 15–41)
Albumin: 3 g/dL — ABNORMAL LOW (ref 3.5–5.0)
Alkaline Phosphatase: 160 U/L — ABNORMAL HIGH (ref 38–126)
Anion gap: 8 (ref 5–15)
BUN: 8 mg/dL (ref 6–20)
CO2: 24 mmol/L (ref 22–32)
Calcium: 9.2 mg/dL (ref 8.9–10.3)
Chloride: 107 mmol/L (ref 98–111)
Creatinine, Ser: 1.01 mg/dL — ABNORMAL HIGH (ref 0.44–1.00)
GFR, Estimated: >60 mL/min (ref >60)
Glucose, Bld: 102 mg/dL — ABNORMAL HIGH (ref 70–99)
Potassium: 3.9 mmol/L (ref 3.5–5.1)
Sodium: 139 mmol/L (ref 135–145)
Total Bilirubin: 4.4 mg/dL — ABNORMAL HIGH (ref 0.3–1.2)
Total Protein: 6.5 g/dL (ref 6.5–8.1)

## 2021-02-09 LAB — SARS CORONAVIRUS 2 BY RT PCR (HOSPITAL ORDER, PERFORMED IN ~~LOC~~ HOSPITAL LAB): SARS Coronavirus 2: NEGATIVE

## 2021-02-09 LAB — GLUCOSE, CAPILLARY: Glucose-Capillary: 100 mg/dL — ABNORMAL HIGH (ref 70–99)

## 2021-02-09 SURGERY — ERCP, WITH INTERVENTION IF INDICATED
Anesthesia: General

## 2021-02-09 MED ORDER — DEXAMETHASONE SODIUM PHOSPHATE 10 MG/ML IJ SOLN
INTRAMUSCULAR | Status: DC | PRN
  Administered 2021-02-09: 5 mg via INTRAVENOUS

## 2021-02-09 MED ORDER — INDOMETHACIN 50 MG RE SUPP
RECTAL | Status: AC
  Filled 2021-02-09: qty 2

## 2021-02-09 MED ORDER — ONDANSETRON HCL 4 MG/2ML IJ SOLN
INTRAMUSCULAR | Status: DC | PRN
  Administered 2021-02-09: 4 mg via INTRAVENOUS

## 2021-02-09 MED ORDER — SUGAMMADEX SODIUM 200 MG/2ML IV SOLN
INTRAVENOUS | Status: DC | PRN
  Administered 2021-02-09: 200 mg via INTRAVENOUS

## 2021-02-09 MED ORDER — SODIUM CHLORIDE 0.9 % IV SOLN
INTRAVENOUS | Status: DC | PRN
  Administered 2021-02-09: 30 mL

## 2021-02-09 MED ORDER — GLUCAGON HCL RDNA (DIAGNOSTIC) 1 MG IJ SOLR
INTRAMUSCULAR | Status: DC | PRN
  Administered 2021-02-09: .25 mg via INTRAVENOUS

## 2021-02-09 MED ORDER — SODIUM CHLORIDE 0.9 % IV SOLN: INTRAVENOUS | Status: DC

## 2021-02-09 MED ORDER — FENTANYL CITRATE (PF) 100 MCG/2ML IJ SOLN
INTRAMUSCULAR | Status: DC | PRN
  Administered 2021-02-09: 100 ug via INTRAVENOUS
  Administered 2021-02-09 (×2): 50 ug via INTRAVENOUS

## 2021-02-09 MED ORDER — METRONIDAZOLE 500 MG/100ML IV SOLN
500.0000 mg | Freq: Three times a day (TID) | INTRAVENOUS | Status: DC
  Administered 2021-02-09 – 2021-02-10 (×3): 500 mg via INTRAVENOUS
  Filled 2021-02-09 (×3): qty 100

## 2021-02-09 MED ORDER — PROPOFOL 10 MG/ML IV BOLUS
INTRAVENOUS | Status: DC | PRN
  Administered 2021-02-09: 200 mg via INTRAVENOUS

## 2021-02-09 MED ORDER — DIPHENHYDRAMINE HCL 25 MG PO CAPS
25.0000 mg | ORAL_CAPSULE | Freq: Four times a day (QID) | ORAL | Status: DC | PRN
  Administered 2021-02-09 – 2021-02-10 (×2): 25 mg via ORAL
  Filled 2021-02-09 (×2): qty 1

## 2021-02-09 MED ORDER — GLUCAGON HCL RDNA (DIAGNOSTIC) 1 MG IJ SOLR
INTRAMUSCULAR | Status: AC
  Filled 2021-02-09: qty 1

## 2021-02-09 MED ORDER — LIDOCAINE HCL (PF) 2 % IJ SOLN
INTRAMUSCULAR | Status: DC | PRN
  Administered 2021-02-09: 60 mg via INTRADERMAL

## 2021-02-09 MED ORDER — ADULT MULTIVITAMIN W/MINERALS CH
1.0000 | ORAL_TABLET | Freq: Every day | ORAL | Status: DC
  Administered 2021-02-10: 1 via ORAL
  Filled 2021-02-09: qty 1

## 2021-02-09 MED ORDER — MIDAZOLAM HCL 2 MG/2ML IJ SOLN
INTRAMUSCULAR | Status: DC | PRN
  Administered 2021-02-09: 2 mg via INTRAVENOUS

## 2021-02-09 MED ORDER — ENSURE ENLIVE PO LIQD
237.0000 mL | Freq: Three times a day (TID) | ORAL | Status: DC
  Administered 2021-02-09 – 2021-02-10 (×2): 237 mL via ORAL

## 2021-02-09 MED ORDER — ROCURONIUM BROMIDE 10 MG/ML (PF) SYRINGE
PREFILLED_SYRINGE | INTRAVENOUS | Status: DC | PRN
  Administered 2021-02-09 (×2): 20 mg via INTRAVENOUS
  Administered 2021-02-09: 60 mg via INTRAVENOUS

## 2021-02-09 MED ORDER — LACTATED RINGERS IV SOLN
INTRAVENOUS | Status: DC
  Administered 2021-02-09: 1000 mL via INTRAVENOUS

## 2021-02-09 MED ORDER — CIPROFLOXACIN IN D5W 400 MG/200ML IV SOLN
400.0000 mg | Freq: Two times a day (BID) | INTRAVENOUS | Status: DC
  Administered 2021-02-09 – 2021-02-10 (×3): 400 mg via INTRAVENOUS
  Filled 2021-02-09 (×4): qty 200

## 2021-02-09 NOTE — Progress Notes (Signed)
Initial Nutrition Assessment  DOCUMENTATION CODES:  Severe malnutrition in context of acute illness/injury  INTERVENTION:  Advance diet as medically able and as tolerated.  Add Ensure Enlive po TID, each supplement provides 350 kcal and 20 grams of protein.  Add Magic cup TID with meals, each supplement provides 290 kcal and 9 grams of protein.  Add MVI with minerals daily.  NUTRITION DIAGNOSIS:  Severe Malnutrition related to acute illness, nausea, vomiting as evidenced by percent weight loss, energy intake < or equal to 50% for > or equal to 5 days, mild fat depletion, mild muscle depletion.  GOAL:  Patient will meet greater than or equal to 90% of their needs  MONITOR:  Diet advancement, PO intake, Supplement acceptance, Labs, Weight trends, I & O's  REASON FOR ASSESSMENT:  Malnutrition Screening Tool    ASSESSMENT:  55 yo female with a PMH of bipolar disorder, IBS, hx of BPD, anxiety, and migraines who presents with biliary stricture. 5/20 - ERCP, biliary brushing, Sphincterotomy, biliary stent placement, biopsy 6/16 - ERCP with stent exchange  Spoke with pt at bedside. She reports nausea and vomiting on an off for the last month. She reports that some food stayed down okay, like wife's lasagna, but other foods did not (fried chicken, corn, collards). She has been trying to drink Ensure daily, but sometimes it comes up as well.  Pt reports ~28 lb weight loss over the last month. Per Epic, pt has lost ~47 lbs (22.3%) since 01/24/21, which is significant and severe for the time frame.  On exam, pt has mild depletions throughout body.  Given above information, pt is severely malnourished in the acute setting.  Recommend adding Ensure Enlive BID, Magic Cup TID, and MVI with minerals for repletion once diet is advanced.  Medications: reviewed; Latuda, ciprofloxacin BID per IV, Flagyl TID per IV, Dilaudid PRN (given 3 times today)  Labs: reviewed; CBG 100  NUTRITION -  FOCUSED PHYSICAL EXAM: Flowsheet Row Most Recent Value  Orbital Region Mild depletion  Upper Arm Region No depletion  Thoracic and Lumbar Region No depletion  Buccal Region Mild depletion  Temple Region Mild depletion  Clavicle Bone Region Mild depletion  Clavicle and Acromion Bone Region Mild depletion  Scapular Bone Region Unable to assess  Dorsal Hand No depletion  Patellar Region No depletion  Anterior Thigh Region Mild depletion  Posterior Calf Region Mild depletion  Edema (RD Assessment) None  Hair Reviewed  Eyes Reviewed  [Jaundice]  Mouth Reviewed  Skin Reviewed  Nails Reviewed   Diet Order:   Diet Order             Diet NPO time specified  Diet effective now                  EDUCATION NEEDS:  Education needs have been addressed  Skin:  Skin Assessment: Reviewed RN Assessment  Last BM:  02/06/21  Height:  Ht Readings from Last 1 Encounters:  02/08/21 5\' 9"  (1.753 m)   Weight:  Wt Readings from Last 1 Encounters:  02/08/21 75.7 kg   Ideal Body Weight:  66 kg  BMI:  Body mass index is 24.65 kg/m.  Estimated Nutritional Needs:  Kcal:  1850-2050 Protein:  95-110 grams Fluid:  >1.85 L  Derrel Nip, RD, LDN Registered Dietitian I After-Hours/Weekend Pager # in [place]

## 2021-02-09 NOTE — Consult Note (Signed)
[facility] Consult: 8:22 AM 02/09/2021  LOS: 0 days    Referring Provider: Dr Michaelle Birks  Primary Care Physician:  [name], MD Primary Gastroenterologist:  Dr. Lucio Edward     Reason for Consultation:  Rising LFTS.  Persistent abd pain and N/V inpatient with malignant biliary stricture.   HPI: Robin [name] is a 55 y.o. female.  PMH asthma.  Anxiety, bipolar, borderline personality.  IBS. OSA. 02/2020 colonoscopy: diverticulosis, 5 mm polyp (benign polypoid mucosa).    Admission 5/18 -01/14/2021 with abdominal pain, elevated LFTs, MRCP with biliary ductal dilation/abrupt caliber transition of CBD through the pancreatic portion.  Left hepatic lobe lesion, question sclerosed hemangioma though atypical and recommend follow-up imaging 3 to 6 months.  CT without contrast contrast showed multiple low-attenuation lesions in the liver, diffuse biliary ductal dilatation, soft tissue prominence at pancreatic head.  CA 19-9 72, CEA not elevated.  01/13/2021 ERCP by Dr. Carlean Purl revealed severe, indeterminate stricture at lower third main bile duct.  Stricture treated with 5 cm 10 French stent placement.  Brushings for cytology suspicious for malignancy.  Biopsy of ampulla showed benign small bowel mucosa. Bilateral upper abdominal pain radiating around to the back continues, control refractory to oxycodone.  Patient using Aleve.  Nausea/vomiting, Zofran not helping, but keeping down some food including protein shakes.  ~ 20# wt loss.   Surgical evaluation yesterday by Dr. Michaelle Birks for discussion of Whipple procedure.  MD planned chest CT to complete staging as well as pancreas protocol CT abdomen/pelvis for surgical planning and for r/o ERCP related pancreatitis.  Obtained labs which show rising bilirubin.  Patient  was directed to the hospital ED for admission and rule out stent occlusion.  T bili 4.4.  Over the course of recent hospital admission and 10 days post discharge this had gone from 7.5 >> 1.4 following stent placement. Alk phos 160.  Had been increasing over course of recent admission from 90 to >> 145. AST/ALT 142/202 compared with prior of 240s/500 >> 100/137 after stent Lipase 42. WBC 16.7, a rising trend had been noted starting near time of discharge, 10 days after discharge where measurements 11.1 >> 12.7. INR normal 1.   COVID-19 negative CTAP pndg.   CT chest pndg  Cipro, Flagyl initiated day 1.  PRN Dilaudid, oxycodone, Zofran in place.  Pain and nausea are now controlled as the Dilaudid is working quite effectively.  Past Medical History:  Diagnosis Date   Asthma    Bipolar disorder ([place])    Diverticulosis    Gestational hypertension    HA (headache)    History of borderline personality disorder    Hypersomnia, persistent 04/29/2013   IBS (irritable bowel syndrome)    Memory loss, short term    [name] 04-03-13    Migraine    Reflux esophagitis    Seasonal allergies     Past Surgical History:  Procedure Laterality Date   ABDOMINAL HYSTERECTOMY  00   BILIARY BRUSHING  01/13/2021   Procedure: BILIARY BRUSHING;  Surgeon: Gatha Mayer, MD;  Location: [facility];  Service: Endoscopy;;   BILIARY STENT PLACEMENT N/A 01/13/2021   Procedure: BILIARY STENT PLACEMENT;  Surgeon: Gatha Mayer, MD;  Location: [facility];  Service: Endoscopy;  Laterality: N/A;   BIOPSY  01/13/2021   Procedure: BIOPSY;  Surgeon: Gatha Mayer, MD;  Location: [facility];  Service: Endoscopy;;   COLONOSCOPY  01/2020   ERCP N/A 01/13/2021   Procedure: ENDOSCOPIC RETROGRADE CHOLANGIOPANCREATOGRAPHY (ERCP);  Surgeon: Gatha Mayer, MD;  Location: [facility];  Service: Endoscopy;  Laterality: N/A;   KNEE SURGERY Right 96,98,00,02,05,2014   NASAL SINUS SURGERY     NOSE SURGERY     [id number]    SPHINCTEROTOMY  01/13/2021   Procedure: SPHINCTEROTOMY;  Surgeon: Gatha Mayer, MD;  Location: [facility];  Service: Endoscopy;;   WRIST SURGERY Right [phone]    Prior to Admission medications   Medication Sig Start Date End Date Taking? Authorizing Provider  azelastine (ASTELIN) 0.1 % nasal spray Place 2 sprays into both nostrils 2 (two) times daily. Patient taking differently: Place 2 sprays into both nostrils 2 (two) times daily as needed for allergies. 07/05/20   [name], MD  budesonide (PULMICORT) 0.5 MG/2ML nebulizer solution Take 0.5 mg by nebulization daily as needed (sob/wheezing). 10/20/20   [provider]  cetirizine (ZYRTEC) 10 MG tablet Take 10 mg by mouth daily.    [provider]  clobetasol cream (TEMOVATE) 8.12 % Apply 1 application topically 2 (two) times daily. Patient not taking: Reported on 01/11/2021 10/20/19   [name], MD  cyclobenzaprine (FLEXERIL) 5 MG tablet TAKE 1 TABLET(5 MG) BY MOUTH AT BEDTIME AS NEEDED FOR LOWER BACK PAIN OR SEVERE HEADACHE 08/29/20   Narda Amber K, DO  cycloSPORINE (RESTASIS) 0.05 % ophthalmic emulsion INSTILL 1 DROP IN [facility] EYE TWICE DAILY Patient not taking: Reported on 01/11/2021 11/23/19   [provider]  dicyclomine (BENTYL) 10 MG capsule Take 1 capsule (10 mg total) by mouth in the morning and at bedtime. 03/15/20   Noralyn Pick, NP  EPINEPHrine 0.3 mg/0.3 mL IJ SOAJ injection Inject 0.3 mg into the muscle as needed for anaphylaxis. INJECT 0.3 MLS (0.3 MG TOTAL) INTO THE MUSCLE ONCE 12/14/20   [name], MD  fluticasone [facility]) 50 MCG/ACT nasal spray Place 1 spray into both nostrils in the morning and at bedtime.    [provider]  gabapentin (NEURONTIN) 300 MG capsule Take 300 mg by mouth daily as needed (pain). 12/13/20   [provider]  HYDROcodone-acetaminophen (NORCO/VICODIN) 5-325 MG tablet Take 1 tablet by mouth every 6 (six) hours as needed for  severe pain. 01/27/21   Gatha Mayer, MD  LATUDA 40 MG TABS tablet Take 1 tablet (40 mg total) by mouth daily with breakfast. 10/10/17   Dara Hoyer, PA-C  MOBIC 15 MG tablet Take 1 tablet by mouth daily. 11/14/20   [provider]  ondansetron (ZOFRAN ODT) 8 MG disintegrating tablet Take 1 tablet (8 mg total) by mouth every 8 (eight) hours as needed for nausea or vomiting. 01/27/21   Gatha Mayer, MD  prochlorperazine (COMPAZINE) 10 MG tablet TAKE 1 TABLET BY MOUTH EVERY 8 HOURS AS NEEDED( ALONG WITH DICLOFENAC AND DIPHENHYDRAMINE FOR MIGRAINES) 06/27/20   [name], MD  valACYclovir (VALTREX) 1000 MG tablet Take 1,000 mg by mouth daily as needed (shingles). 10/20/19   [provider]    Scheduled Meds:  lurasidone  40 mg Oral Q breakfast  Infusions:  sodium chloride 75 mL/hr at 02/09/21 0802   ciprofloxacin 400 mg (02/09/21 0805)   metronidazole 500 mg (02/09/21 0810)   PRN Meds: budesonide, cyclobenzaprine, HYDROmorphone (DILAUDID) injection, ondansetron **OR** ondansetron (ZOFRAN) IV, oxyCODONE, prochlorperazine **OR** prochlorperazine   Allergies as of 02/08/2021 - Review Complete 02/08/2021  Allergen Reaction Noted   Aspirin Nausea And Vomiting and Rash 04/02/2013   Bee venom Anaphylaxis 11/23/2014   Penicillins Anaphylaxis 04/02/2013   Bactrim [sulfamethoxazole-trimethoprim] Nausea And Vomiting 04/02/2013   Sulfa antibiotics Nausea And Vomiting 11/23/2014    Family History  Problem Relation Age of Onset   Depression Mother    High Cholesterol Mother    Hypertension Mother    Heart failure Mother    Hypertension Father    High blood pressure Maternal Grandfather    Heart attack Maternal Grandfather    Breast cancer Maternal Grandmother    Diabetes Maternal Grandmother    High blood pressure Maternal Grandmother    Heart disease Maternal Grandmother    Breast cancer Sister    Schizophrenia Brother    Colon cancer Neg Hx    Esophageal  cancer Neg Hx    Rectal cancer Neg Hx    Stomach cancer Neg Hx     Social History   Socioeconomic History   Marital status: Married    Spouse name: Scientist, physiological   Number of children: 1   Years of education: AA   Highest education level: Not on file  Occupational History    Employer: [employer]  Tobacco Use   Smoking status: Never   Smokeless tobacco: Never  Vaping Use   Vaping Use: Never used  Substance and Sexual Activity   Alcohol use: No    Comment: 2-3 drinks per year   Drug use: No   Sexual activity: Not on file  Other Topics Concern   Not on file  Social History Narrative   Pneumonia vaccine 2005   Patient lives at home with wife and 2 sons.    Patient works in [place].    Patient has her AA   Right handed   One story home      Social Determinants of Health   Financial Resource Strain: Not on file  Food Insecurity: Not on file  Transportation Needs: Not on file  Physical Activity: Not on file  Stress: Not on file  Social Connections: Not on file  Intimate Partner Violence: Not on file    REVIEW OF SYSTEMS: Constitutional: Tired but no profound weakness. ENT:  No nose bleeds Pulm: No difficulty breathing or cough. CV:  No palpitations, no LE edema.  No angina GU:  No hematuria, no frequency. Urine orange.   GI: See HPI. Heme: No unusual or excessive bleeding or bruising. Transfusions: None. Neuro:  No headaches, no peripheral tingling or numbness.  No seizures, no syncope. Derm:  some pruritus, no rash or sores.  Endocrine:  No sweats or chills.  No polyuria or dysuria Immunization: Not queried. Travel:  None beyond local counties in last few months.    PHYSICAL EXAM: Vital signs in last 24 hours: Vitals:   02/09/21 0010 02/09/21 0507  BP: 104/61 (!) 102/52  Pulse: 80 79  Resp: 18 18  Temp: 98.8 F (37.1 C) 98.4 F (36.9 C)  SpO2: 98% 99%   Wt Readings from Last 3 Encounters:  02/08/21 75.7  kg  01/24/21 97.6 kg  01/12/21 97.6 kg    General: Pleasant, comfortable,  non-ill-appearing Head: No facial asymmetry or swelling.  No signs of head trauma. Eyes: Sclera icteric.  No conjunctival pallor. Ears: No hearing deficit Nose: No congestion or discharge Mouth: Oropharynx moist, pink, clear.  Tongue midline. Neck: No JVD, no masses, no thyromegaly Lungs: No labored breathing or cough.  Lungs clear bilaterally. Heart: RRR.  No MRG.  S1, S2 present Abdomen: Soft, mild tenderness across the upper abdomen without guarding or rebound.  No HSM, masses, hernias, bruits.   Rectal: Deferred Musc/Skeltl: No joint redness, swelling or gross deformity. Extremities: No CCE. Neurologic: Oriented x3.  Alert.  No tremors.  No gross deficits.  Moves all 4 limbs without weakness. Skin: No rash, no sores, no telangiectasia. Nodes: No cervical adenopathy Psych: Calm, pleasant, cooperative.  Intake/Output from previous day: 06/15 0701 - 06/16 0700 In: 827.8 [P.O.:225; I.V.:602.8] Out: -  Intake/Output this shift: No intake/output data recorded.  LAB RESULTS: Recent Labs    02/08/21 2030  WBC 16.7*  HGB 12.5  HCT 39.5  PLT 294   BMET Lab Results  Component Value Date   NA 139 02/09/2021   NA 140 02/08/2021   NA 138 01/25/2021   K 3.9 02/09/2021   K 3.7 02/08/2021   K 3.8 01/25/2021   CL 107 02/09/2021   CL 103 02/08/2021   CL 107 01/25/2021   CO2 24 02/09/2021   CO2 27 02/08/2021   CO2 22 01/25/2021   GLUCOSE 102 (H) 02/09/2021   GLUCOSE 121 (H) 02/08/2021   GLUCOSE 122 (H) 01/25/2021   BUN 8 02/09/2021   BUN 7 02/08/2021   BUN 17 01/25/2021   CREATININE 1.01 (H) 02/09/2021   CREATININE 1.08 (H) 02/08/2021   CREATININE 0.93 01/25/2021   CALCIUM 9.2 02/09/2021   CALCIUM 9.3 02/08/2021   CALCIUM 9.6 01/25/2021   LFT Recent Labs    02/09/21 0551  PROT 6.5  ALBUMIN 3.0*  AST 142*  ALT 202*  ALKPHOS 160*  BILITOT 4.4*   PT/INR Lab Results  Component Value  Date   INR 1.0 02/08/2021   INR 1.0 01/11/2021   Hepatitis Panel No results for input(s): HEPBSAG, HCVAB, HEPAIGM, HEPBIGM in the last 72 hours. C-Diff No components found for: CDIFF Lipase     Component Value Date/Time   LIPASE 42 01/25/2021 0012    Drugs of Abuse  No results found for: LABOPIA, COCAINSCRNUR, LABBENZ, AMPHETMU, THCU, LABBARB   RADIOLOGY STUDIES: No results found.    IMPRESSION:      Persistent abdominal pain, nausea, vomiting and rising LFTs in patient with presumed malignancy,  extrahepatic cholangiocarcinoma vs pancreatic.   Recent ERCP, placement plastic biliary stent.  R/O erly stent occlusion.  Waiting on report from CTAP and chest.      PLAN:       ERCP.  Aim for slot this afternoon.  Patient understands risks and benefits and is eager to proceed.  Continue Cipro/Flagyl.   Azucena Freed  02/09/2021, 8:22 AM Phone [phone]

## 2021-02-09 NOTE — Interval H&P Note (Signed)
History and Physical Interval Note:  02/09/2021 12:54 PM  Robin [name]  has presented today for surgery, with the diagnosis of BIliary stent malfunction, abnormal LFTs, jaundice.  The various methods of treatment have been discussed with the patient and family. After consideration of risks, benefits and other options for treatment, the patient has consented to  Procedure(s): ENDOSCOPIC RETROGRADE CHOLANGIOPANCREATOGRAPHY (ERCP) (N/A) UPPER ENDOSCOPIC ULTRASOUND (EUS) LINEAR (N/A) as a surgical intervention.  The patient's history has been reviewed, patient examined, no change in status, stable for surgery.  I have reviewed the patient's chart and labs.  Questions were answered to the patient's satisfaction.    The risks of an EUS including intestinal perforation, bleeding, infection, aspiration, and medication effects were discussed as was the possibility it may not give a definitive diagnosis if a biopsy is performed.  When a biopsy of the pancreas is done as part of the EUS, there is an additional risk of pancreatitis at the rate of about 1-2%.  It was explained that procedure related pancreatitis is typically mild, although it can be severe and even life threatening, which is why we do not perform random pancreatic biopsies and only biopsy a lesion/area we feel is concerning enough to warrant the risk.  The risks of an ERCP were discussed at length, including but not limited to the risk of perforation, bleeding, abdominal pain, post-ERCP pancreatitis (while usually mild can be severe and even life threatening).   [employer]

## 2021-02-09 NOTE — Discharge Instructions (Addendum)
[facility] Stay Proper nutrition can help your body recover from illness and injury.   Foods and beverages high in protein, vitamins, and minerals help rebuild muscle loss, promote healing, & reduce fall risk.   In addition to eating healthy foods, a nutrition shake is an easy, delicious way to get the nutrition you need during and after your hospital stay  It is recommended that you continue to drink 3 bottles per day of: Ensure for at least 1 month (30 days) after your hospital stay   Tips for adding a nutrition shake into your routine: As allowed, drink one with vitamins or medications instead of water or juice Enjoy one as a tasty mid-morning or afternoon snack Drink cold or make a milkshake out of it Drink one instead of milk with cereal or snacks Use as a coffee creamer   Available at the following grocery stores and pharmacies:           * [facility] [phone]            For COUPONS visit: www.ensure.com/join or [email or web address]-[email or web address]   Suggested Substitutions Ensure Plus = Boost Plus = Carnation Breakfast Essentials = Boost Compact Ensure Active Clear = Boost Breeze Glucerna Shake = Boost Glucose Control = Carnation Breakfast Essentials SUGAR FREE   [facility] [place] [facility] DISCHARGE INSTRUCTIONS  Activity You may resume activities as tolerated. Do not drive while taking narcotic pain medication.  When to Call us: Fever greater than 100.5 Severe pain, nausea, or vomiting Jaundice (yellowing of the whites of the eyes or skin)  Follow-up Dr. Zenia Resides will call you when the results of your most recent biopsy are available.  For questions or concerns, please call the office at [phone].

## 2021-02-09 NOTE — Op Note (Signed)
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 Barbette Merino, MD, [facility]                            [facility] Medicines:                General Anesthesia, Glucagon 0.5 mg IV,                            Indomethacin not administered due to Aspirin                            allergy, Antibiotics had been given earlier in the                            day so not administered Complications:            No immediate complications. Estimated Blood Loss:     Estimated blood loss was minimal. Procedure:                Pre-Anesthesia Assessment:                           - Prior to the procedure, a History and Physical                            was performed, and patient medications and                            allergies were reviewed. The patient's tolerance of                            previous anesthesia was also reviewed. The risks  and benefits of the procedure and the sedation                            options and risks were discussed with the patient.                            All questions were answered, and informed consent                            was obtained. Prior Anticoagulants: The patient has                            taken no previous anticoagulant or antiplatelet                            agents.  ASA Grade Assessment: III - A patient with                            severe systemic disease. After reviewing the risks                            and benefits, the patient was deemed in                            satisfactory condition to undergo the procedure.                           After obtaining informed consent, the endoscope was                            passed under direct vision. Throughout the                            procedure, the patient's blood pressure, pulse, and                            oxygen saturations were monitored continuously. The                            [id number]-[id number] ([id number]) Olympus gastroscope was                            introduced through the mouth, and advanced to the                            second part of duodenum. The [id number] ([id number])                            Olympus Linear EUS was introduced through the                            mouth, and advanced to the duodenum for ultrasound  examination from the stomach and duodenum. The                            upper EUS was accomplished without difficulty. The                            patient tolerated the procedure. Scope In: Scope Out: Findings:      ENDOSCOPIC FINDING: :      No gross lesions were noted in the entire esophagus.      The Z-line was regular and was found 40 cm from the incisors.      Diffuse severe inflammation characterized by erosions, erythema and       granularity was found in the cardia and in the gastric body.      No other gross lesions were noted in the entire examined stomach.       Biopsies were taken with a cold forceps for histology and Helicobacter       pylori testing.      A previously placed plastic biliary stent was seen at the major papilla.       Removal of a stent was accomplished with a snare.      There was evidence of a patent biliary sphincterotomy.      No gross lesions were noted in the duodenal bulb, in the first portion        of the duodenum and in the second portion of the duodenum.      ENDOSONOGRAPHIC FINDING: :      Moderate hyperechoic material consistent with sludge was visualized       endosonographically in the gallbladder.      There was a suggestion of a stricture in the lower third of the main       bile duct.      Upstream to the stricture was dilation in the middle third of the main       bile duct and in the upper third of the main bile duct.      Within this dilated portion of the biliary tree, moderate hyperechoic       material consistent with sludge was visualized endosonographically.      An irregular mass-like region was identified in the pancreatic head. The       area was hypoechoic. It measured 22 mm by 23 mm in maximal       cross-sectional diameter. The endosonographic borders were       poorly-defined. There was sonographic evidence suggesting of portal vein       abutment. An intact interface was seen between the area and the superior       mesenteric artery and celiac trunk suggesting a lack of invasion. The       remainder of the pancreas was examined. The endosonographic appearance       of parenchyma and a portion of the upstream pancreatic duct indicated       duct dilation (PDH - 4.3 mm, PDN - 4.1 mm, PDB 2.5 mm, PDT - 1.9 mm).       Fine needle biopsy was performed of the mass-like region. Color Doppler       imaging was utilized prior to needle puncture to confirm a lack of       significant vascular structures within the needle path. Seven passes       were made with  the 22 gauge ultrasound core biopsy needle using a       transduodenal approach. Visible cores of tissue were obtained. Touch       preps were performed. Final cytology results are pending.      Endosonographic imaging in the visualized portion of the liver showed no       mass.      No malignant-appearing lymph nodes were visualized in the celiac region       (level 20), peripancreatic region and porta  hepatis region.      The celiac region was visualized. Impression:               EGD Impression:                           - No gross lesions in esophagus. Z-line regular, 40                            cm from the incisors.                           - Severe gastritis noted. No other gross lesions in                            the stomach. Biopsied.                           - Plastic biliary stent in the duodenum. Removed.                            Revealed a patent biliary sphincterotomy was found.                           - No gross lesions in the duodenal bulb, in the                            first portion of the duodenum and in the second                            portion of the duodenum.                           EUS Impression:                           - Hyperechoic material consistent with sludge was                            visualized endosonographically in the gallbladder.                           - There was a suggestion of a stricture in the                            lower third of the main bile duct leading to  dilation in the middle third of the main bile duct                            and in the upper third of the main bile duct with                            evidence of hyperechoic material consistent with                            sludge within the common bile duct.                           - A mass-like region was identified in the                            pancreatic head where the CBD narrowed and where                            the upstream PD dilation was noted. This is not the                            most confluent of masses however. Cytology results                            are pending. However, the endosonographic                            appearance is suggestive of potential pancreatic                            adenocarcinoma. This was staged T2 N0 Mx by                            endosonographic criteria. The  staging applies if                            malignancy is confirmed. Fine needle biopsy                            performed.                           - No malignant-appearing lymph nodes were                            visualized in the celiac region (level 20),                            peripancreatic region and porta hepatis region. Recommendation:           - Proceed to scheduled ERCP.                           - Observe patient's clinical course.                           -  Await cytology results and await path results.                           - Start Protonix 40 mg daily.                           - The findings and recommendations were discussed                            with the patient.                           - The findings and recommendations were discussed                            with the patient's family.                           - The findings and recommendations were discussed                            with the referring physician. Procedure Code(s):        --- Professional ---                           [phone], Esophagogastroduodenoscopy, flexible,                            transoral; with transendoscopic ultrasound-guided                            intramural or transmural fine needle                            aspiration/biopsy(s), (includes endoscopic                            ultrasound examination limited to the esophagus,                            stomach or duodenum, and adjacent structures)                           43247, Esophagogastroduodenoscopy, flexible,                            transoral; with removal of foreign body(s) Diagnosis Code(s):        --- Professional ---                           K29.70, Gastritis, unspecified, without bleeding                           Z46.59, Encounter for fitting and adjustment of                            other gastrointestinal appliance and device  L89.373, Other specified  postprocedural states                           K83.8, Other specified diseases of biliary tract                           K86.89, Other specified diseases of pancreas                           I89.9, Noninfective disorder of lymphatic vessels                            and lymph nodes, unspecified                           R94.5, Abnormal results of liver function studies                           R93.3, Abnormal findings on diagnostic imaging of                            other parts of digestive tract                           R93.2, Abnormal findings on diagnostic imaging of                            liver and biliary tract CPT copyright 2019 [employer]. All rights reserved. The codes documented in this report are preliminary and upon coder review may  be revised to meet current compliance requirements. Justice Britain, MD 02/09/2021 3:46:09 PM Number of Addenda: 0

## 2021-02-09 NOTE — Anesthesia Procedure Notes (Signed)
Procedure Name: Intubation Date/Time: 02/09/2021 1:38 PM Performed by: Myna Bright, CRNA Pre-anesthesia Checklist: Patient identified, Emergency Drugs available, Suction available and Patient being monitored Patient Re-evaluated:Patient Re-evaluated prior to induction Oxygen Delivery Method: Circle system utilized Preoxygenation: Pre-oxygenation with 100% oxygen Induction Type: IV induction Ventilation: Mask ventilation without difficulty Laryngoscope Size: Mac and 3 Grade View: Grade II Tube type: Oral Tube size: 7.0 mm Number of attempts: 1 Airway Equipment and Method: Stylet Placement Confirmation: ETT inserted through vocal cords under direct vision, positive ETCO2 and breath sounds checked- equal and bilateral Secured at: 22 cm Tube secured with: Tape Dental Injury: Teeth and Oropharynx as per pre-operative assessment

## 2021-02-09 NOTE — H&P (View-Only) (Signed)
[facility] Consult: 8:22 AM 02/09/2021  LOS: 0 days    Referring Provider: Dr Michaelle Birks  Primary Care Physician:  [name], MD Primary Gastroenterologist:  Dr. Lucio Edward     Reason for Consultation:  Rising LFTS.  Persistent abd pain and N/V inpatient with malignant biliary stricture.   HPI: Robin [name] is a 55 y.o. female.  PMH asthma.  Anxiety, bipolar, borderline personality.  IBS. OSA. 02/2020 colonoscopy: diverticulosis, 5 mm polyp (benign polypoid mucosa).    Admission 5/18 -01/14/2021 with abdominal pain, elevated LFTs, MRCP with biliary ductal dilation/abrupt caliber transition of CBD through the pancreatic portion.  Left hepatic lobe lesion, question sclerosed hemangioma though atypical and recommend follow-up imaging 3 to 6 months.  CT without contrast contrast showed multiple low-attenuation lesions in the liver, diffuse biliary ductal dilatation, soft tissue prominence at pancreatic head.  CA 19-9 72, CEA not elevated.  01/13/2021 ERCP by Dr. Carlean Purl revealed severe, indeterminate stricture at lower third main bile duct.  Stricture treated with 5 cm 10 French stent placement.  Brushings for cytology suspicious for malignancy.  Biopsy of ampulla showed benign small bowel mucosa. Bilateral upper abdominal pain radiating around to the back continues, control refractory to oxycodone.  Patient using Aleve.  Nausea/vomiting, Zofran not helping, but keeping down some food including protein shakes.  ~ 20# wt loss.   Surgical evaluation yesterday by Dr. Michaelle Birks for discussion of Whipple procedure.  MD planned chest CT to complete staging as well as pancreas protocol CT abdomen/pelvis for surgical planning and for r/o ERCP related pancreatitis.  Obtained labs which show rising bilirubin.  Patient  was directed to the hospital ED for admission and rule out stent occlusion.  T bili 4.4.  Over the course of recent hospital admission and 10 days post discharge this had gone from 7.5 >> 1.4 following stent placement. Alk phos 160.  Had been increasing over course of recent admission from 90 to >> 145. AST/ALT 142/202 compared with prior of 240s/500 >> 100/137 after stent Lipase 42. WBC 16.7, a rising trend had been noted starting near time of discharge, 10 days after discharge where measurements 11.1 >> 12.7. INR normal 1.   COVID-19 negative CTAP pndg.   CT chest pndg  Cipro, Flagyl initiated day 1.  PRN Dilaudid, oxycodone, Zofran in place.  Pain and nausea are now controlled as the Dilaudid is working quite effectively.  Past Medical History:  Diagnosis Date   Asthma    Bipolar disorder ([place])    Diverticulosis    Gestational hypertension    HA (headache)    History of borderline personality disorder    Hypersomnia, persistent 04/29/2013   IBS (irritable bowel syndrome)    Memory loss, short term    [name] 04-03-13    Migraine    Reflux esophagitis    Seasonal allergies     Past Surgical History:  Procedure Laterality Date   ABDOMINAL HYSTERECTOMY  00   BILIARY BRUSHING  01/13/2021   Procedure: BILIARY BRUSHING;  Surgeon: Gatha Mayer, MD;  Location: [facility];  Service: Endoscopy;;   BILIARY STENT PLACEMENT N/A 01/13/2021   Procedure: BILIARY STENT PLACEMENT;  Surgeon: Gatha Mayer, MD;  Location: [facility];  Service: Endoscopy;  Laterality: N/A;   BIOPSY  01/13/2021   Procedure: BIOPSY;  Surgeon: Gatha Mayer, MD;  Location: [facility];  Service: Endoscopy;;   COLONOSCOPY  01/2020   ERCP N/A 01/13/2021   Procedure: ENDOSCOPIC RETROGRADE CHOLANGIOPANCREATOGRAPHY (ERCP);  Surgeon: Gatha Mayer, MD;  Location: [facility];  Service: Endoscopy;  Laterality: N/A;   KNEE SURGERY Right 96,98,00,02,05,2014   NASAL SINUS SURGERY     NOSE SURGERY     [phone]    SPHINCTEROTOMY  01/13/2021   Procedure: SPHINCTEROTOMY;  Surgeon: Gatha Mayer, MD;  Location: [facility];  Service: Endoscopy;;   WRIST SURGERY Right [phone]    Prior to Admission medications   Medication Sig Start Date End Date Taking? Authorizing Provider  azelastine (ASTELIN) 0.1 % nasal spray Place 2 sprays into both nostrils 2 (two) times daily. Patient taking differently: Place 2 sprays into both nostrils 2 (two) times daily as needed for allergies. 07/05/20   [name], MD  budesonide (PULMICORT) 0.5 MG/2ML nebulizer solution Take 0.5 mg by nebulization daily as needed (sob/wheezing). 10/20/20   [provider]  cetirizine (ZYRTEC) 10 MG tablet Take 10 mg by mouth daily.    [provider]  clobetasol cream (TEMOVATE) 2.42 % Apply 1 application topically 2 (two) times daily. Patient not taking: Reported on 01/11/2021 10/20/19   [name], MD  cyclobenzaprine (FLEXERIL) 5 MG tablet TAKE 1 TABLET(5 MG) BY MOUTH AT BEDTIME AS NEEDED FOR LOWER BACK PAIN OR SEVERE HEADACHE 08/29/20   Narda Amber K, DO  cycloSPORINE (RESTASIS) 0.05 % ophthalmic emulsion INSTILL 1 DROP IN [facility] EYE TWICE DAILY Patient not taking: Reported on 01/11/2021 11/23/19   [provider]  dicyclomine (BENTYL) 10 MG capsule Take 1 capsule (10 mg total) by mouth in the morning and at bedtime. 03/15/20   Noralyn Pick, NP  EPINEPHrine 0.3 mg/0.3 mL IJ SOAJ injection Inject 0.3 mg into the muscle as needed for anaphylaxis. INJECT 0.3 MLS (0.3 MG TOTAL) INTO THE MUSCLE ONCE 12/14/20   [name], MD  fluticasone [facility]) 50 MCG/ACT nasal spray Place 1 spray into both nostrils in the morning and at bedtime.    [provider]  gabapentin (NEURONTIN) 300 MG capsule Take 300 mg by mouth daily as needed (pain). 12/13/20   [provider]  HYDROcodone-acetaminophen (NORCO/VICODIN) 5-325 MG tablet Take 1 tablet by mouth every 6 (six) hours as needed for  severe pain. 01/27/21   Gatha Mayer, MD  LATUDA 40 MG TABS tablet Take 1 tablet (40 mg total) by mouth daily with breakfast. 10/10/17   Dara Hoyer, PA-C  MOBIC 15 MG tablet Take 1 tablet by mouth daily. 11/14/20   [provider]  ondansetron (ZOFRAN ODT) 8 MG disintegrating tablet Take 1 tablet (8 mg total) by mouth every 8 (eight) hours as needed for nausea or vomiting. 01/27/21   Gatha Mayer, MD  prochlorperazine (COMPAZINE) 10 MG tablet TAKE 1 TABLET BY MOUTH EVERY 8 HOURS AS NEEDED( ALONG WITH DICLOFENAC AND DIPHENHYDRAMINE FOR MIGRAINES) 06/27/20   [name], MD  valACYclovir (VALTREX) 1000 MG tablet Take 1,000 mg by mouth daily as needed (shingles). 10/20/19   [provider]    Scheduled Meds:  lurasidone  40 mg Oral Q breakfast  Infusions:  sodium chloride 75 mL/hr at 02/09/21 0802   ciprofloxacin 400 mg (02/09/21 0805)   metronidazole 500 mg (02/09/21 0810)   PRN Meds: budesonide, cyclobenzaprine, HYDROmorphone (DILAUDID) injection, ondansetron **OR** ondansetron (ZOFRAN) IV, oxyCODONE, prochlorperazine **OR** prochlorperazine   Allergies as of 02/08/2021 - Review Complete 02/08/2021  Allergen Reaction Noted   Aspirin Nausea And Vomiting and Rash 04/02/2013   Bee venom Anaphylaxis 11/23/2014   Penicillins Anaphylaxis 04/02/2013   Bactrim [sulfamethoxazole-trimethoprim] Nausea And Vomiting 04/02/2013   Sulfa antibiotics Nausea And Vomiting 11/23/2014    Family History  Problem Relation Age of Onset   Depression Mother    High Cholesterol Mother    Hypertension Mother    Heart failure Mother    Hypertension Father    High blood pressure Maternal Grandfather    Heart attack Maternal Grandfather    Breast cancer Maternal Grandmother    Diabetes Maternal Grandmother    High blood pressure Maternal Grandmother    Heart disease Maternal Grandmother    Breast cancer Sister    Schizophrenia Brother    Colon cancer Neg Hx    Esophageal  cancer Neg Hx    Rectal cancer Neg Hx    Stomach cancer Neg Hx     Social History   Socioeconomic History   Marital status: Married    Spouse name: Scientist, physiological   Number of children: 1   Years of education: AA   Highest education level: Not on file  Occupational History    Employer: [employer]: [place] [place]  Tobacco Use   Smoking status: Never   Smokeless tobacco: Never  Vaping Use   Vaping Use: Never used  Substance and Sexual Activity   Alcohol use: No    Comment: 2-3 drinks per year   Drug use: No   Sexual activity: Not on file  Other Topics Concern   Not on file  Social History Narrative   Pneumonia vaccine 2005   Patient lives at home with wife and 2 sons.    Patient works in [place].    Patient has her AA   Right handed   One story home      Social Determinants of Health   Financial Resource Strain: Not on file  Food Insecurity: Not on file  Transportation Needs: Not on file  Physical Activity: Not on file  Stress: Not on file  Social Connections: Not on file  Intimate Partner Violence: Not on file    REVIEW OF SYSTEMS: Constitutional: Tired but no profound weakness. ENT:  No nose bleeds Pulm: No difficulty breathing or cough. CV:  No palpitations, no LE edema.  No angina GU:  No hematuria, no frequency. Urine orange.   GI: See HPI. Heme: No unusual or excessive bleeding or bruising. Transfusions: None. Neuro:  No headaches, no peripheral tingling or numbness.  No seizures, no syncope. Derm:  some pruritus, no rash or sores.  Endocrine:  No sweats or chills.  No polyuria or dysuria Immunization: Not queried. Travel:  None beyond local counties in last few months.    PHYSICAL EXAM: Vital signs in last 24 hours: Vitals:   02/09/21 0010 02/09/21 0507  BP: 104/61 (!) 102/52  Pulse: 80 79  Resp: 18 18  Temp: 98.8 F (37.1 C) 98.4 F (36.9 C)  SpO2: 98% 99%   Wt Readings from Last 3 Encounters:  02/08/21 75.7  kg  01/24/21 97.6 kg  01/12/21 97.6 kg    General: Pleasant, comfortable,  non-ill-appearing Head: No facial asymmetry or swelling.  No signs of head trauma. Eyes: Sclera icteric.  No conjunctival pallor. Ears: No hearing deficit Nose: No congestion or discharge Mouth: Oropharynx moist, pink, clear.  Tongue midline. Neck: No JVD, no masses, no thyromegaly Lungs: No labored breathing or cough.  Lungs clear bilaterally. Heart: RRR.  No MRG.  S1, S2 present Abdomen: Soft, mild tenderness across the upper abdomen without guarding or rebound.  No HSM, masses, hernias, bruits.   Rectal: Deferred Musc/Skeltl: No joint redness, swelling or gross deformity. Extremities: No CCE. Neurologic: Oriented x3.  Alert.  No tremors.  No gross deficits.  Moves all 4 limbs without weakness. Skin: No rash, no sores, no telangiectasia. Nodes: No cervical adenopathy Psych: Calm, pleasant, cooperative.  Intake/Output from previous day: 06/15 0701 - 06/16 0700 In: 827.8 [P.O.:225; I.V.:602.8] Out: -  Intake/Output this shift: No intake/output data recorded.  LAB RESULTS: Recent Labs    02/08/21 2030  WBC 16.7*  HGB 12.5  HCT 39.5  PLT 294   BMET Lab Results  Component Value Date   NA 139 02/09/2021   NA 140 02/08/2021   NA 138 01/25/2021   K 3.9 02/09/2021   K 3.7 02/08/2021   K 3.8 01/25/2021   CL 107 02/09/2021   CL 103 02/08/2021   CL 107 01/25/2021   CO2 24 02/09/2021   CO2 27 02/08/2021   CO2 22 01/25/2021   GLUCOSE 102 (H) 02/09/2021   GLUCOSE 121 (H) 02/08/2021   GLUCOSE 122 (H) 01/25/2021   BUN 8 02/09/2021   BUN 7 02/08/2021   BUN 17 01/25/2021   CREATININE 1.01 (H) 02/09/2021   CREATININE 1.08 (H) 02/08/2021   CREATININE 0.93 01/25/2021   CALCIUM 9.2 02/09/2021   CALCIUM 9.3 02/08/2021   CALCIUM 9.6 01/25/2021   LFT Recent Labs    02/09/21 0551  PROT 6.5  ALBUMIN 3.0*  AST 142*  ALT 202*  ALKPHOS 160*  BILITOT 4.4*   PT/INR Lab Results  Component Value  Date   INR 1.0 02/08/2021   INR 1.0 01/11/2021   Hepatitis Panel No results for input(s): HEPBSAG, HCVAB, HEPAIGM, HEPBIGM in the last 72 hours. C-Diff No components found for: CDIFF Lipase     Component Value Date/Time   LIPASE 42 01/25/2021 0012    Drugs of Abuse  No results found for: LABOPIA, COCAINSCRNUR, LABBENZ, AMPHETMU, THCU, LABBARB   RADIOLOGY STUDIES: No results found.    IMPRESSION:      Persistent abdominal pain, nausea, vomiting and rising LFTs in patient with presumed malignancy,  extrahepatic cholangiocarcinoma vs pancreatic.   Recent ERCP, placement plastic biliary stent.  R/O erly stent occlusion.  Waiting on report from CTAP and chest.      PLAN:       ERCP.  Aim for slot this afternoon.  Patient understands risks and benefits and is eager to proceed.  Continue Cipro/Flagyl.   Azucena Freed  02/09/2021, 8:22 AM Phone [phone]

## 2021-02-09 NOTE — Progress Notes (Signed)
       Subjective: Pain and nausea improved today. Bilirubin up to 4.4, WBC 16. Afebrile. Stent appears appropriately positioned on CT.   Objective: Vital signs in last 24 hours: Temp:  [98.4 F (36.9 C)-100.3 F (37.9 C)] 98.4 F (36.9 C) (06/16 0507) Pulse Rate:  [79-109] 79 (06/16 0507) Resp:  [17-18] 18 (06/16 0507) BP: (102-131)/(52-70) 102/52 (06/16 0507) SpO2:  [98 %-100 %] 99 % (06/16 0507) Weight:  [75.7 kg] 75.7 kg (06/15 1937) Last BM Date: 02/06/21  Intake/Output from previous day: 06/15 0701 - 06/16 0700 In: 827.8 [P.O.:225; I.V.:602.8] Out: -  Intake/Output this shift: No intake/output data recorded.  PE: General: resting comfortably, NAD Neuro: alert and oriented, no focal deficits Resp: normal work of breathing Abdomen: soft, nondistended, nontender to palpation Extremities: warm and well-perfused  Lab Results:  Recent Labs    02/08/21 2030  WBC 16.7*  HGB 12.5  HCT 39.5  PLT 294   BMET Recent Labs    02/08/21 2030 02/09/21 0551  NA 140 139  K 3.7 3.9  CL 103 107  CO2 27 24  GLUCOSE 121* 102*  BUN 7 8  CREATININE 1.08* 1.01*  CALCIUM 9.3 9.2   PT/INR Recent Labs    02/08/21 2030  LABPROT 13.6  INR 1.0   CMP     Component Value Date/Time   NA 139 02/09/2021 0551   K 3.9 02/09/2021 0551   CL 107 02/09/2021 0551   CO2 24 02/09/2021 0551   GLUCOSE 102 (H) 02/09/2021 0551   BUN 8 02/09/2021 0551   CREATININE 1.01 (H) 02/09/2021 0551   CREATININE 1.14 (H) 03/18/2017 1553   CALCIUM 9.2 02/09/2021 0551   PROT 6.5 02/09/2021 0551   ALBUMIN 3.0 (L) 02/09/2021 0551   AST 142 (H) 02/09/2021 0551   ALT 202 (H) 02/09/2021 0551   ALKPHOS 160 (H) 02/09/2021 0551   BILITOT 4.4 (H) 02/09/2021 0551   GFRNONAA >60 02/09/2021 0551   GFRNONAA 63 12/08/2014 0813   GFRAA 55 (L) 05/28/2017 2237   GFRAA 72 12/08/2014 0813   Lipase     Component Value Date/Time   LIPASE 42 01/25/2021 0012       Studies/Results: No results  found.  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 Presenting with chills, abdominal pain and rising bilirubin, suspicious for stent occlusion. - GI consulted for ERCP with stent exchange - Antibiotics for cholangitis prophylaxis given leukocytosis and chills. Patient has anaphylactic penicillin allergy so will give cipro/flagyl. - NPO for procedure, maintenance IV fluids - VTE: SCDs - Dispo: admitted to observation, med-surg floor    LOS: 0 days    Michaelle Birks, MD [facility] General, Hepatobiliary and Pancreatic Surgery 02/09/21 7:30 AM

## 2021-02-09 NOTE — Anesthesia Preprocedure Evaluation (Addendum)
Anesthesia Evaluation  Patient identified by MRN, date of birth, ID band Patient awake    Reviewed: Allergy & Precautions, NPO status , Patient's Chart, lab work & pertinent test results  Airway Mallampati: II  TM Distance: >3 FB Neck ROM: Full    Dental no notable dental hx. (+) Teeth Intact, Dental Advisory Given   Pulmonary asthma , sleep apnea ,    Pulmonary exam normal breath sounds clear to auscultation       Cardiovascular hypertension, Normal cardiovascular exam Rhythm:Regular Rate:Normal  TTE 2018 Normal EF, no significant valvular abnormalities   Neuro/Psych  Headaches, PSYCHIATRIC DISORDERS Depression Bipolar Disorder    GI/Hepatic Neg liver ROS, PUD, GERD  Medicated and Controlled,  Endo/Other  negative endocrine ROS  Renal/GU negative Renal ROS  negative genitourinary   Musculoskeletal negative musculoskeletal ROS (+)   Abdominal   Peds  Hematology negative hematology ROS (+)   Anesthesia Other Findings ERCP for biliary stent malfunction, abnormal LFTs, jaundice  Persistent abdominal pain, nausea, vomiting and rising LFTs in patient with presumed malignancy,  extrahepatic cholangiocarcinoma vs pancreatic.   Recent ERCP, placement plastic biliary stent.  ERCP today to r/o stent occlusion  Reproductive/Obstetrics                            Anesthesia Physical Anesthesia Plan  ASA: 3  Anesthesia Plan: General   Post-op Pain Management:    Induction: Intravenous  PONV Risk Score and Plan: 3 and Midazolam, Dexamethasone and Ondansetron  Airway Management Planned: Oral ETT  Additional Equipment:   Intra-op Plan:   Post-operative Plan: Extubation in OR  Informed Consent: I have reviewed the patients History and Physical, chart, labs and discussed the procedure including the risks, benefits and alternatives for the proposed anesthesia with the patient or authorized  representative who has indicated his/her understanding and acceptance.     Dental advisory given  Plan Discussed with: CRNA  Anesthesia Plan Comments:         Anesthesia Quick Evaluation

## 2021-02-09 NOTE — Op Note (Signed)
[facility] Patient Name: Robin [name] Procedure Date : 02/09/2021 MRN: [id number] Attending MD: Justice Britain , MD Date of Birth: 1965-12-17 CSN: [id number] Age: 55 Admit Type: Inpatient Procedure:                ERCP Indications:              Malignant stricture of the common bile duct,                            Biliary dilation on Computed Tomogram Scan,                            Jaundice, Abnormal liver function test, Stent change Providers:                Justice Britain, MD, [facility], RN, Lesia Sago, Technician Referring MD:             Blanchard Mane. Barbette Merino, MD, [place]                            [place] Medicines:                General Anesthesia, Glucagon 0.5 mg IV, Antibiotics                            not administered since they were given earlier in                            day, Indomethacin not administered due to Aspirin                            allergy Complications:            No immediate complications. Estimated Blood Loss:     Estimated blood loss was minimal. Procedure:                Pre-Anesthesia Assessment:                           - Prior to the procedure, a History and Physical                            was performed, and patient medications and                            allergies were reviewed. The patient's tolerance of                            previous anesthesia was also reviewed. The risks                            and benefits of the procedure and the sedation  options and risks were discussed with the patient.                            All questions were answered, and informed consent                            was obtained. Prior Anticoagulants: The patient has                            taken no previous anticoagulant or antiplatelet                            agents. ASA Grade Assessment: III - A patient with                             severe systemic disease. After reviewing the risks                            and benefits, the patient was deemed in                            satisfactory condition to undergo the procedure.                           After obtaining informed consent, the scope was                            passed under direct vision. Throughout the                            procedure, the patient's blood pressure, pulse, and                            oxygen saturations were monitored continuously. The                            TJF- Q180V ([id number]) Olympus duodenoscope was                            introduced through the mouth, and used to inject                            contrast into and used to inject contrast into the                            bile duct. The ERCP was accomplished without                            difficulty. The patient tolerated the procedure. Scope In: Scope Out: Findings:      A biliary stent was visible on the initial scout film before EUS. The       scout film was normal at time of start of ERCP since we had removed the  stent during EUS portion.      The esophagus was successfully intubated under direct vision without       detailed examination of the pharynx, larynx, and associated structures,       and upper GI tract. A biliary sphincterotomy had been performed. The       sphincterotomy appeared open.      A short 0.035 inch Soft Jagwire was passed into the biliary tree. The       Hydratome sphincterotome was passed over the guidewire and the bile duct       was then deeply cannulated. Contrast was injected. I personally       interpreted the bile duct images. Ductal flow of contrast was adequate.       Image quality was adequate. Contrast extended to the hepatic ducts.       Opacification of the entire biliary tree except for the cystic duct and       gallbladder was successful. The lower third of the main bile duct       contained a single severe stenosis 25  mm in length. The middle third of       the main bile duct and upper third of the main bile duct were moderately       dilated, secondary to a stricture. The largest diameter was 11 mm. The       middle third of the main bile duct and upper third of the main bile duct       contained filling defect(s) thought to be sludge. No evidence of       gallbladder filling at this time suggestive of chronic occlusion, so       decision made to pursue placement of one 10 mm by 4 cm covered metal       biliary stent into the common bile duct. Bile flowed through the stent.       The stent was in good position. To discover objects, the biliary tree       was swept with a Hydratome sphincterotome leading to significant sludge       removal. To enhance the stent function since a tight stricture was       present, dilation of the common bile duct stent with a 6 mm Hurricane       balloon dilator was successful and allowed improved expansion.      A pancreatogram was not performed.      The duodenoscope was withdrawn from the patient. Impression:               - Previous biliary stent removed during EUS portion                            of today's examination.                           - Prior biliary sphincterotomy appeared open.                           - The fluoroscopic examination was suspicious for                            sludge.                           -  A single severe biliary stricture was found in                            the lower third of the main bile duct. The                            stricture was malignant appearing and led to                            dilation of the upper third of the main bile duct                            and middle third of the main bile duct.                           - One covered metal biliary stent was placed into                            the common bile duct across the stricture. This                            stent was expanded with biliary  balloon dilation.                           - The biliary tree was swept and sludge was found. Recommendation:           - The patient will be observed post-procedure,                            until all discharge criteria are met.                           - Return patient to hospital ward for ongoing care.                           - Observe patient's clinical course.                           - Check liver enzymes (AST, ALT, alkaline                            phosphatase, bilirubin) in the morning.                           - Watch for pancreatitis, bleeding, perforation,                            and cholangitis.                           - Monitor patient for signs/symptoms of                            cholecystitis development in future(although no  evidence of gallbladder filling on today's                            examination, suggestive of chronic occlusion, it is                            possible that cystic duct may now be occluded over                            the metal stent). If this were to occur, recommend                            stent pull and replacement with multiple plastic                            stents or Uncovered SEMS.                           - The findings and recommendations were discussed                            with the patient.                           - The findings and recommendations were discussed                            with the patient's family.                           - The findings and recommendations were discussed                            with the referring physician. Procedure Code(s):        --- Professional ---                           [id number], Endoscopic retrograde                            cholangiopancreatography (ERCP); with placement of                            endoscopic stent into biliary or pancreatic duct,                            including pre- and post-dilation and guide wire                             passage, when performed, including sphincterotomy,                            when performed, each stent                           [id number],  Endoscopic retrograde                            cholangiopancreatography (ERCP); with removal of                            calculi/debris from biliary/pancreatic duct(s) Diagnosis Code(s):        --- Professional ---                           K83.1, Obstruction of bile duct                           R17, Unspecified jaundice                           R94.5, Abnormal results of liver function studies                           Z46.59, Encounter for fitting and adjustment of                            other gastrointestinal appliance and device                           K83.8, Other specified diseases of biliary tract CPT copyright 2019 [employer]. All rights reserved. The codes documented in this report are preliminary and upon coder review may  be revised to meet current compliance requirements. Justice Britain, MD 02/09/2021 4:04:08 PM Number of Addenda: 0

## 2021-02-09 NOTE — Transfer of Care (Signed)
Immediate Anesthesia Transfer of Care Note  Patient: Clary Boulais  Procedure(s) Performed: ENDOSCOPIC RETROGRADE CHOLANGIOPANCREATOGRAPHY (ERCP) UPPER ENDOSCOPIC ULTRASOUND (EUS) LINEAR BIOPSY STENT REMOVAL FINE NEEDLE ASPIRATION (FNA) LINEAR BILIARY STENT PLACEMENT  Patient Location: Endoscopy Unit  Anesthesia Type:General  Level of Consciousness: drowsy and patient cooperative  Airway & Oxygen Therapy: Patient Spontanous Breathing  Post-op Assessment: Report given to RN  Post vital signs: Reviewed and stable  Last Vitals:  Vitals Value Taken Time  BP 137/67 02/09/21 1528  Temp 36.7 C 02/09/21 1528  Pulse 106 02/09/21 1530  Resp 23 02/09/21 1530  SpO2 99 % 02/09/21 1530  Vitals shown include unvalidated device data.  Last Pain:  Vitals:   02/09/21 1528  TempSrc: Axillary  PainSc: 0-No pain         Complications: No notable events documented.

## 2021-02-10 ENCOUNTER — Ambulatory Visit (HOSPITAL_COMMUNITY): Payer: 59

## 2021-02-10 ENCOUNTER — Encounter (HOSPITAL_COMMUNITY): Payer: Self-pay

## 2021-02-10 DIAGNOSIS — R7989 Other specified abnormal findings of blood chemistry: Secondary | ICD-10-CM

## 2021-02-10 LAB — CBC
HCT: 39.3 % (ref 36.0–46.0)
Hemoglobin: 12.4 g/dL (ref 12.0–15.0)
MCH: 27.4 pg (ref 26.0–34.0)
MCHC: 31.6 g/dL (ref 30.0–36.0)
MCV: 86.9 fL (ref 80.0–100.0)
Platelets: 311 10*3/uL (ref 150–400)
RBC: 4.52 MIL/uL (ref 3.87–5.11)
RDW: 13.4 % (ref 11.5–15.5)
WBC: 14.7 10*3/uL — ABNORMAL HIGH (ref 4.0–10.5)
nRBC: 0 % (ref 0.0–0.2)

## 2021-02-10 LAB — COMPREHENSIVE METABOLIC PANEL
ALT: 174 U/L — ABNORMAL HIGH (ref 0–44)
AST: 91 U/L — ABNORMAL HIGH (ref 15–41)
Albumin: 3.1 g/dL — ABNORMAL LOW (ref 3.5–5.0)
Alkaline Phosphatase: 157 U/L — ABNORMAL HIGH (ref 38–126)
Anion gap: 10 (ref 5–15)
BUN: 8 mg/dL (ref 6–20)
CO2: 24 mmol/L (ref 22–32)
Calcium: 9.4 mg/dL (ref 8.9–10.3)
Chloride: 104 mmol/L (ref 98–111)
Creatinine, Ser: 1 mg/dL (ref 0.44–1.00)
GFR, Estimated: >60 mL/min (ref >60)
Glucose, Bld: 100 mg/dL — ABNORMAL HIGH (ref 70–99)
Potassium: 3.8 mmol/L (ref 3.5–5.1)
Sodium: 138 mmol/L (ref 135–145)
Total Bilirubin: 1.4 mg/dL — ABNORMAL HIGH (ref 0.3–1.2)
Total Protein: 6.9 g/dL (ref 6.5–8.1)

## 2021-02-10 LAB — CYTOLOGY - NON PAP

## 2021-02-10 MED ORDER — ADULT MULTIVITAMIN W/MINERALS CH: 1.0000 | ORAL_TABLET | Freq: Every day | ORAL | 2 refills | Status: AC

## 2021-02-10 MED ORDER — OXYCODONE HCL 5 MG PO TABS: 5.0000 mg | ORAL_TABLET | ORAL | 0 refills | Status: DC | PRN

## 2021-02-10 NOTE — Progress Notes (Signed)
Gastroenterology Inpatient Follow-up Note   PATIENT IDENTIFICATION  Robin [name] is a 55 y.o. 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  Status post EUS and stent exchange for rising bilirubin. Hospital Day: 3  SUBJECTIVE  Patient evaluated this morning. She feels better than yesterday. Pain is rated as a 2-3 out of 10. She had a shower this morning and feels improved overall. LFTs show downtrend. Leukocytosis downtrending. EUS biopsies have not returned as of yet. Her urine is becoming less dark at this time. No pruritus.   OBJECTIVE  Scheduled Inpatient Medications:   feeding supplement  237 mL Oral TID BM   lurasidone  40 mg Oral Q breakfast   multivitamin with minerals  1 tablet Oral Daily   Continuous Inpatient Infusions:   sodium chloride 75 mL/hr at 02/09/21 0802   ciprofloxacin 400 mg (02/10/21 0814)   metronidazole 500 mg (02/10/21 0812)   PRN Inpatient Medications: budesonide, cyclobenzaprine, diphenhydrAMINE, ondansetron **OR** ondansetron (ZOFRAN) IV, oxyCODONE, prochlorperazine **OR** prochlorperazine   Physical Examination  Temp:  [97.8 F (36.6 C)-99.3 F (37.4 C)] 98.4 F (36.9 C) (06/17 0442) Pulse Rate:  [76-108] 76 (06/17 0442) Resp:  [16-24] 17 (06/17 0442) BP: (118-150)/(51-114) 118/64 (06/17 0442) SpO2:  [94 %-100 %] 98 % (06/17 0442) Temp (24hrs), Avg:98.5 F (36.9 C), Min:97.8 F (36.6 C), Max:99.3 F (37.4 C)  Weight: 75.7 kg GEN: NAD, appears stated age, doesn't appear chronically ill PSYCH: Cooperative, without pressured speech EYE: Sclerae icteric ENT: MMM CV: Nontachycardic RESP: No audible wheezing GI: NABS, soft, protuberant abdomen, tenderness to palpation in midepigastrium and right upper quadrant, volitional guarding is present, no rebound  MSK/EXT: Trace lower extremity edema present SKIN: Due to skin  color jaundice difficult to delineate NEURO:  Alert & Oriented x 3, no focal deficits, no evidence of asterixis   Review of Data   Laboratory Studies   Recent Labs  Lab 02/10/21 0042  NA 138  K 3.8  CL 104  CO2 24  BUN 8  CREATININE 1.00  GLUCOSE 100*  CALCIUM 9.4   Recent Labs  Lab 02/10/21 0042  AST 91*  ALT 174*  ALKPHOS 157*    Recent Labs  Lab 02/08/21 2030 02/10/21 0042  WBC 16.7* 14.7*  HGB 12.5 12.4  HCT 39.5 39.3  PLT 294 311   Recent Labs  Lab 02/08/21 2030  INR 1.0    Imaging Studies  CT ABDOMEN PELVIS W WO CONTRAST  Result Date: 02/09/2021 CLINICAL DATA:  55 year old female with history of common bile duct stricture with suspicious findings on recent biopsy concerning for potential malignant stricture. Staging examination. EXAM: CT CHEST WITHOUT CONTRAST AND CT OF ABDOMEN AND PELVIS WITH CONTRAST TECHNIQUE: Multidetector CT imaging of the chest was performed following the standard protocol. Multidetector CT imaging of the abdomen and pelvis was performed following the standard protocol during bolus administration of intravenous contrast. CONTRAST:  153mL OMNIPAQUE IOHEXOL 300 MG/ML  SOLN COMPARISON:  CT the abdomen and pelvis 01/11/2021. Abdominal MRI 01/12/2021. FINDINGS: CT CHEST FINDINGS Cardiovascular: Heart size is normal. There is no significant pericardial fluid, thickening or pericardial calcification. There is aortic atherosclerosis, as well as atherosclerosis of the great vessels of the mediastinum and the coronary arteries, including calcified atherosclerotic plaque in the left anterior descending coronary artery. Mediastinum/Nodes: No pathologically enlarged mediastinal or hilar lymph nodes. Please note that accurate exclusion of hilar adenopathy is limited on noncontrast CT scans. Esophagus  is unremarkable in appearance. No axillary lymphadenopathy. Lungs/Pleura: In the posterolateral aspect of the right upper lobe (axial image 57 of series 4)  there is a 1.2 x 0.7 cm ground-glass attenuation nodule. No other suspicious appearing pulmonary nodules or masses are noted. No acute consolidative airspace disease. No pleural effusions. Musculoskeletal: There are no aggressive appearing lytic or blastic lesions noted in the visualized portions of the skeleton. CT ABDOMEN PELVIS FINDINGS Hepatobiliary: In the left lobe of the liver between segments 2 and 4A there is a 3.4 x 2.9 cm low-attenuation lesion (axial image 9 of series 3) which demonstrates some early peripheral nodular enhancement with some progressive centripetal filling, favored to represent a cavernous hemangioma. Other subcentimeter low-attenuation lesions in the liver are too small to definitively characterize, but appear to represent tiny cysts based on comparison with prior abdominal MRI. No other suspicious appearing hepatic lesions. No intrahepatic biliary ductal dilatation. Pneumobilia new compared to the prior study related to indwelling common bile duct stent which appears appropriately located. Common bile duct remains dilated measuring up to 1.5 cm in diameter. Gallbladder is largely decompressed, leading to apparent wall thickening (6 mm). No cholelithiasis. No pericholecystic fluid or surrounding inflammatory changes. Small amount of pneumobilia within the gallbladder. Pancreas: In the posterior aspect of the pancreatic head (axial image 50 of series 6 and coronal image 39 of series 18) there is a hypovascular area noted predominantly during arterial phase imaging measuring 1.9 x 2.1 x 2.5 cm, immediately adjacent to the common bile duct in the area where the previously noted stricture was identified. The dilated common bile duct abruptly terminates immediately above this potential lesion. This is associated with dilatation of the main pancreatic duct which measures up to 5 mm, which also appears to abruptly terminate in the region of the suspected lesion. No peripancreatic fluid  collections or inflammatory changes. Spleen: Unremarkable. Adrenals/Urinary Tract: Bilateral kidneys and adrenal glands are normal in appearance. No hydroureteronephrosis. Urinary bladder is normal in appearance. Stomach/Bowel: The appearance of the stomach is normal. No pathologic dilatation of small bowel or colon. Normal appendix. Vascular/Lymphatic: Atherosclerotic calcifications in the pelvic vasculature. No aneurysm or dissection noted in the abdominal or pelvic vasculature. Previously described pancreatic lesion is well separate from the superior mesenteric artery, superior mesenteric vein and common hepatic artery, but is in direct contact with the anterior surface of the inferior vena cava. Reproductive: Status post hysterectomy. Ovaries are unremarkable in appearance. Other: No significant volume of ascites.  No pneumoperitoneum. Musculoskeletal: There are no aggressive appearing lytic or blastic lesions noted in the visualized portions of the skeleton. IMPRESSION: 1. Findings are highly suspicious for a hypovascular mass in the posterior aspect of the head of the pancreas, as detailed above. This appears associated with persistent narrowing of the distal common bile duct and abrupt termination of the pancreatic duct. Common bile duct stent appears appropriately located, but is associated with some persistent dilatation of the common bile duct. 2. 1.2 x 0.7 cm ground-glass attenuation nodule in the right upper lobe. Initial follow-up with CT at 6-12 months is recommended to confirm persistence. If persistent, repeat CT is recommended every 2 years until 5 years of stability has been established. This recommendation follows the consensus statement: Guidelines for Management of Incidental Pulmonary Nodules Detected on CT Images: From the Fleischner Society 2017; Radiology 2017; 284:228-243. 3. Multiple liver lesions, smallest of which likely represent tiny simple cysts. The larger lesion in the left lobe of  the liver has imaging characteristics  once again most suggestive of a cavernous hemangioma. 4. Aortic atherosclerosis, in addition to left anterior descending coronary artery disease. Please note that although the presence of coronary artery calcium documents the presence of coronary artery disease, the severity of this disease and any potential stenosis cannot be assessed on this non-gated CT examination. Assessment for potential risk factor modification, dietary therapy or pharmacologic therapy may be warranted, if clinically indicated. 5. Additional incidental findings, as above. Electronically Signed   By: Vinnie Langton M.D.   On: 02/09/2021 10:57   CT CHEST WO CONTRAST  Result Date: 02/09/2021 CLINICAL DATA:  55 year old female with history of common bile duct stricture with suspicious findings on recent biopsy concerning for potential malignant stricture. Staging examination. EXAM: CT CHEST WITHOUT CONTRAST AND CT OF ABDOMEN AND PELVIS WITH CONTRAST TECHNIQUE: Multidetector CT imaging of the chest was performed following the standard protocol. Multidetector CT imaging of the abdomen and pelvis was performed following the standard protocol during bolus administration of intravenous contrast. CONTRAST:  132mL OMNIPAQUE IOHEXOL 300 MG/ML  SOLN COMPARISON:  CT the abdomen and pelvis 01/11/2021. Abdominal MRI 01/12/2021. FINDINGS: CT CHEST FINDINGS Cardiovascular: Heart size is normal. There is no significant pericardial fluid, thickening or pericardial calcification. There is aortic atherosclerosis, as well as atherosclerosis of the great vessels of the mediastinum and the coronary arteries, including calcified atherosclerotic plaque in the left anterior descending coronary artery. Mediastinum/Nodes: No pathologically enlarged mediastinal or hilar lymph nodes. Please note that accurate exclusion of hilar adenopathy is limited on noncontrast CT scans. Esophagus is unremarkable in appearance. No axillary  lymphadenopathy. Lungs/Pleura: In the posterolateral aspect of the right upper lobe (axial image 57 of series 4) there is a 1.2 x 0.7 cm ground-glass attenuation nodule. No other suspicious appearing pulmonary nodules or masses are noted. No acute consolidative airspace disease. No pleural effusions. Musculoskeletal: There are no aggressive appearing lytic or blastic lesions noted in the visualized portions of the skeleton. CT ABDOMEN PELVIS FINDINGS Hepatobiliary: In the left lobe of the liver between segments 2 and 4A there is a 3.4 x 2.9 cm low-attenuation lesion (axial image 9 of series 3) which demonstrates some early peripheral nodular enhancement with some progressive centripetal filling, favored to represent a cavernous hemangioma. Other subcentimeter low-attenuation lesions in the liver are too small to definitively characterize, but appear to represent tiny cysts based on comparison with prior abdominal MRI. No other suspicious appearing hepatic lesions. No intrahepatic biliary ductal dilatation. Pneumobilia new compared to the prior study related to indwelling common bile duct stent which appears appropriately located. Common bile duct remains dilated measuring up to 1.5 cm in diameter. Gallbladder is largely decompressed, leading to apparent wall thickening (6 mm). No cholelithiasis. No pericholecystic fluid or surrounding inflammatory changes. Small amount of pneumobilia within the gallbladder. Pancreas: In the posterior aspect of the pancreatic head (axial image 50 of series 6 and coronal image 39 of series 18) there is a hypovascular area noted predominantly during arterial phase imaging measuring 1.9 x 2.1 x 2.5 cm, immediately adjacent to the common bile duct in the area where the previously noted stricture was identified. The dilated common bile duct abruptly terminates immediately above this potential lesion. This is associated with dilatation of the main pancreatic duct which measures up to 5 mm,  which also appears to abruptly terminate in the region of the suspected lesion. No peripancreatic fluid collections or inflammatory changes. Spleen: Unremarkable. Adrenals/Urinary Tract: Bilateral kidneys and adrenal glands are normal in appearance. No hydroureteronephrosis.  Urinary bladder is normal in appearance. Stomach/Bowel: The appearance of the stomach is normal. No pathologic dilatation of small bowel or colon. Normal appendix. Vascular/Lymphatic: Atherosclerotic calcifications in the pelvic vasculature. No aneurysm or dissection noted in the abdominal or pelvic vasculature. Previously described pancreatic lesion is well separate from the superior mesenteric artery, superior mesenteric vein and common hepatic artery, but is in direct contact with the anterior surface of the inferior vena cava. Reproductive: Status post hysterectomy. Ovaries are unremarkable in appearance. Other: No significant volume of ascites.  No pneumoperitoneum. Musculoskeletal: There are no aggressive appearing lytic or blastic lesions noted in the visualized portions of the skeleton. IMPRESSION: 1. Findings are highly suspicious for a hypovascular mass in the posterior aspect of the head of the pancreas, as detailed above. This appears associated with persistent narrowing of the distal common bile duct and abrupt termination of the pancreatic duct. Common bile duct stent appears appropriately located, but is associated with some persistent dilatation of the common bile duct. 2. 1.2 x 0.7 cm ground-glass attenuation nodule in the right upper lobe. Initial follow-up with CT at 6-12 months is recommended to confirm persistence. If persistent, repeat CT is recommended every 2 years until 5 years of stability has been established. This recommendation follows the consensus statement: Guidelines for Management of Incidental Pulmonary Nodules Detected on CT Images: From the Fleischner Society 2017; Radiology 2017; 284:228-243. 3. Multiple  liver lesions, smallest of which likely represent tiny simple cysts. The larger lesion in the left lobe of the liver has imaging characteristics once again most suggestive of a cavernous hemangioma. 4. Aortic atherosclerosis, in addition to left anterior descending coronary artery disease. Please note that although the presence of coronary artery calcium documents the presence of coronary artery disease, the severity of this disease and any potential stenosis cannot be assessed on this non-gated CT examination. Assessment for potential risk factor modification, dietary therapy or pharmacologic therapy may be warranted, if clinically indicated. 5. Additional incidental findings, as above. Electronically Signed   By: Vinnie Langton M.D.   On: 02/09/2021 10:57   DG ERCP  Result Date: 02/09/2021 CLINICAL DATA:  56 year old female with a history of indwelling biliary stent EXAM: ERCP TECHNIQUE: Multiple spot images obtained with the fluoroscopic device and submitted for interpretation post-procedure. FLUOROSCOPY TIME:  Fluoroscopy Time:  3 minutes 15 seconds COMPARISON:  None. FINDINGS: Limited intraoperative fluoroscopic spot images during ERCP. Initial image demonstrates endoscope projecting over the upper abdomen with a plastic biliary stent in position. Gas within the biliary tree. Subsequently there is removal of a plastic biliary stent. There is then cannulation of the ampulla with the wire, partial opacification of the extrahepatic biliary system with contrast, and placement of a metallic biliary stent. IMPRESSION: Limited images during ERCP demonstrates removal plastic biliary stent and placement of a new metallic biliary stent. Please refer to the dictated operative report for full details of intraoperative findings and procedure. Electronically Signed   By: Corrie Mckusick D.O.   On: 02/09/2021 16:52    GI Procedures and Studies  EUS and ERCP procedures reviewed with patient   ASSESSMENT  Ms. Grippi  is a 55 y.o. 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  Status post EUS and stent exchange for rising bilirubin.  The patient is hemodynamically stable.  Clinically she seems to be improving as well today.  Liver biochemical testing is improved status post metal stent placement.  Discussed with patient  potential risk of covered metal stent for cholecystitis though the chronic nature of the gallbladder distention suggest that this should not be an issue but will need to be considered in the future.  If she develops cholecystitis in future would recommend removal of fully covered stent and placement of multiple plastic stents.  Patient shows no evidence of post ERCP pancreatitis or post EUS pancreatitis.  Biopsies of the pancreatic masslike region are still pending but I will update the patient and Dr. Zenia Resides and Dr. Carlean Purl when those return.  No contraindication from the GI perspective for potential discharge.  Will defer antibiotic regimen to our surgical colleagues.  All patient questions were answered to the best of my ability, and the patient agrees to the aforementioned plan of action with follow-up as indicated.   PLAN/RECOMMENDATIONS  Trend LFTs while inpatient Daily If discharge reasonable to follow-up with oncology and surgery and alert GI if other issues arise If evidence of cholecystitis develops in future, then remove covered stent and place multiple plastic stents I will follow-up EUS biopsies and alert care team when they have returned   The inpatient GI service will sign off at this time but please page/call with questions or concerns.   Justice Britain, MD [facility] Office # [phone]    LOS: 1 day  Irving Copas  02/10/2021, 9:08 AM

## 2021-02-10 NOTE — Progress Notes (Signed)
Patient discharged to home with instructions and prescription. 

## 2021-02-10 NOTE — Anesthesia Postprocedure Evaluation (Signed)
Anesthesia Post Note  Patient: Robin [name]  Procedure(s) Performed: ENDOSCOPIC RETROGRADE CHOLANGIOPANCREATOGRAPHY (ERCP) UPPER ENDOSCOPIC ULTRASOUND (EUS) LINEAR BIOPSY STENT REMOVAL FINE NEEDLE ASPIRATION (FNA) LINEAR BILIARY STENT PLACEMENT     Patient location during evaluation: PACU Anesthesia Type: General Level of consciousness: awake and alert Pain management: pain level controlled Vital Signs Assessment: post-procedure vital signs reviewed and stable Respiratory status: spontaneous breathing, nonlabored ventilation and respiratory function stable Cardiovascular status: blood pressure returned to baseline and stable Postop Assessment: no apparent nausea or vomiting Anesthetic complications: no   No notable events documented.  Last Vitals:  Vitals:   02/10/21 0442 02/10/21 1116  BP: 118/64 134/70  Pulse: 76 78  Resp: 17 17  Temp: 36.9 C 36.9 C  SpO2: 98% 100%    Last Pain:  Vitals:   02/10/21 1116  TempSrc: Oral  PainSc:                  Audry Pili

## 2021-02-11 ENCOUNTER — Encounter: Payer: Self-pay | Admitting: Gastroenterology

## 2021-02-11 ENCOUNTER — Encounter (HOSPITAL_COMMUNITY): Payer: Self-pay | Admitting: Gastroenterology

## 2021-02-12 DIAGNOSIS — K8689 Other specified diseases of pancreas: Secondary | ICD-10-CM | POA: Diagnosis present

## 2021-02-12 NOTE — Discharge Summary (Signed)
Physician Discharge Summary  Patient ID: [name] MRN: [id number] DOB/AGE: May 22, 1966 55 y.o.  Admit date: 02/08/2021 Discharge date: 02/10/2021  Admission Diagnoses: Malignant biliary stricture Hyperbilirubinemia Biliary stent dysfunction Abdominal pain  Discharge Diagnoses:  Active Problems:   Hyperbilirubinemia   Biliary stricture   Abdominal pain   Protein-calorie malnutrition, severe   Pancreatic mass   Discharged Condition: good  Hospital Course: Ms. Robin [name] is a 55 yo female who was recently diagnosed with a likely malignant biliary stricture after presenting with obstructive jaundice. She underwent ERCP with stent placement on 5/20. She presented to clinic a few days ago for surgical evaluation and reported severe epigastric abdominal pain that was getting worse. Labs showed a rising bilirubin, and she was directly admitted due to concern for an occluded stent. She underwent staging scans of the chest, abd and pelvis which showed no metastatic disease. GI was consulted and the patient underwent an ERPC with EUS on 6/16. EUS showed a mass in the head of the pancreas, and an FNA was done. The plastic stent was replaced with a covered metal stent. The following morning her LFTs were downtrending. She was afebrile and hemodynamically stable and abdominal pain had improved. She was examined and deemed appropriate for discharge home.   Consults: GI  Significant Diagnostic Studies: radiology: CT scan: suspicious for mass in the head of the pancreas  and endoscopy: ERCP: plastic stent exchanged for covered metal stent and EUS: HOP mass, FNA performed  Treatments: IV hydration, antibiotics: Cipro and metronidazole, analgesia: Dilaudid and oxycodone, and procedures: ERCP with biliary stent placement and EUS  Discharge Exam: Blood pressure 134/70, pulse 78, temperature 98.5 F (36.9 C), temperature source Oral, resp. rate 17, height 5\' 9"  (1.753 m), weight 75.7 kg, SpO2 100  %. General appearance: alert, cooperative, and appears stated age Eyes: no scleral icterus Neck: trachea midline Resp: normal work of breathing on room air GI: soft, non-tender; bowel sounds normal; no masses,  no organomegaly Extremities: extremities normal, atraumatic, no cyanosis or edema Neurologic: Grossly normal  Disposition: Discharge disposition: 01-Home or Self Care      Discharge Instructions     Call MD for:  persistant nausea and vomiting   Complete by: As directed    Call MD for:  severe uncontrolled pain   Complete by: As directed    Call MD for:  temperature >100.4   Complete by: As directed    Diet general   Complete by: As directed    Increase activity slowly   Complete by: As directed       Allergies as of 02/10/2021       Reactions   Aspirin Nausea And Vomiting, Rash   Bee Venom Anaphylaxis   Penicillins Anaphylaxis   Bactrim [sulfamethoxazole-trimethoprim] Nausea And Vomiting   Sulfa Antibiotics Nausea And Vomiting        Medication List     STOP taking these medications    HYDROcodone-acetaminophen 5-325 MG tablet Commonly known as: NORCO/VICODIN       TAKE these medications    cyclobenzaprine 5 MG tablet Commonly known as: FLEXERIL TAKE 1 TABLET(5 MG) BY MOUTH AT BEDTIME AS NEEDED FOR LOWER BACK PAIN OR SEVERE HEADACHE   dicyclomine 10 MG capsule Commonly known as: BENTYL Take 1 capsule (10 mg total) by mouth in the morning and at bedtime.   EPINEPHrine 0.3 mg/0.3 mL Soaj injection Commonly known as: EPI-PEN Inject 0.3 mg into the muscle as needed for anaphylaxis. INJECT 0.3 MLS (0.3 MG TOTAL)  INTO THE MUSCLE ONCE   fluticasone 50 MCG/ACT nasal spray Commonly known as: FLONASE Place 1 spray into both nostrils 2 (two) times daily.   gabapentin 300 MG capsule Commonly known as: NEURONTIN Take 300 mg by mouth daily as needed (pain).   Latuda 40 MG Tabs tablet Generic drug: lurasidone Take 1 tablet (40 mg total) by mouth  daily with breakfast.   Mobic 15 MG tablet Generic drug: meloxicam Take 1 tablet by mouth daily.   montelukast 10 MG tablet Commonly known as: SINGULAIR Take 10 mg by mouth daily.   multivitamin with minerals Tabs tablet Take 1 tablet by mouth daily.   ondansetron 8 MG disintegrating tablet Commonly known as: Zofran ODT Take 1 tablet (8 mg total) by mouth every 8 (eight) hours as needed for nausea or vomiting.   oxyCODONE 5 MG immediate release tablet Commonly known as: Oxy IR/ROXICODONE Take 1 tablet (5 mg total) by mouth every 4 (four) hours as needed for severe pain.   prochlorperazine 10 MG tablet Commonly known as: COMPAZINE TAKE 1 TABLET BY MOUTH EVERY 8 HOURS AS NEEDED( ALONG WITH DICLOFENAC AND DIPHENHYDRAMINE FOR MIGRAINES)       ASK your doctor about these medications    azelastine 0.1 % nasal spray Commonly known as: ASTELIN Place 2 sprays into both nostrils 2 (two) times daily.   clobetasol cream 0.05 % Commonly known as: TEMOVATE Apply 1 application topically 2 (two) times daily.   Restasis 0.05 % ophthalmic emulsion Generic drug: cycloSPORINE INSTILL 1 DROP IN [facility] EYE TWICE DAILY         Signed: Dwan Bolt 02/12/2021, 6:49 AM

## 2021-02-13 ENCOUNTER — Inpatient Hospital Stay: Payer: 59 | Attending: Physician Assistant | Admitting: Physician Assistant

## 2021-02-13 ENCOUNTER — Telehealth: Payer: Self-pay

## 2021-02-13 ENCOUNTER — Other Ambulatory Visit: Payer: Self-pay

## 2021-02-13 ENCOUNTER — Encounter: Payer: Self-pay | Admitting: Physician Assistant

## 2021-02-13 DIAGNOSIS — Z8249 Family history of ischemic heart disease and other diseases of the circulatory system: Secondary | ICD-10-CM | POA: Insufficient documentation

## 2021-02-13 DIAGNOSIS — Z8349 Family history of other endocrine, nutritional and metabolic diseases: Secondary | ICD-10-CM | POA: Diagnosis not present

## 2021-02-13 DIAGNOSIS — J45909 Unspecified asthma, uncomplicated: Secondary | ICD-10-CM | POA: Diagnosis not present

## 2021-02-13 DIAGNOSIS — I2510 Atherosclerotic heart disease of native coronary artery without angina pectoris: Secondary | ICD-10-CM | POA: Diagnosis not present

## 2021-02-13 DIAGNOSIS — K589 Irritable bowel syndrome without diarrhea: Secondary | ICD-10-CM | POA: Diagnosis not present

## 2021-02-13 DIAGNOSIS — Z79899 Other long term (current) drug therapy: Secondary | ICD-10-CM | POA: Diagnosis not present

## 2021-02-13 DIAGNOSIS — C250 Malignant neoplasm of head of pancreas: Secondary | ICD-10-CM

## 2021-02-13 DIAGNOSIS — C259 Malignant neoplasm of pancreas, unspecified: Secondary | ICD-10-CM | POA: Insufficient documentation

## 2021-02-13 DIAGNOSIS — Z833 Family history of diabetes mellitus: Secondary | ICD-10-CM | POA: Diagnosis not present

## 2021-02-13 DIAGNOSIS — Z803 Family history of malignant neoplasm of breast: Secondary | ICD-10-CM | POA: Insufficient documentation

## 2021-02-13 LAB — SURGICAL PATHOLOGY

## 2021-02-13 MED ORDER — LIDOCAINE-PRILOCAINE 2.5-2.5 % EX CREA: 1.0000 "application " | TOPICAL_CREAM | CUTANEOUS | 2 refills | Status: DC | PRN

## 2021-02-13 MED ORDER — ONDANSETRON HCL 8 MG PO TABS: 8.0000 mg | ORAL_TABLET | Freq: Three times a day (TID) | ORAL | 2 refills | Status: DC | PRN

## 2021-02-13 MED ORDER — PROCHLORPERAZINE MALEATE 10 MG PO TABS: 10.0000 mg | ORAL_TABLET | Freq: Four times a day (QID) | ORAL | 2 refills | Status: DC | PRN

## 2021-02-13 NOTE — Progress Notes (Signed)
Met with patient today prior to her initial medical oncology consult with Cassandra Heilingoetter PA-C and Dr. Bronwen Betters.  I introduced myself and explained my role as nurse navigator.  She was given my direct contact information.  The patient states she is currently drinking Ensure three times daily and since her last ERCP with stent placement has been able to eat some solid foods as well.  A referral has been made to nutrition.  She states that Dr. Michaelle Birks gave her a book on pancreatic cancer.  I suggested she stick with this book and other reliable sources on the internet for information.  She is encouraged to call with any questions or concerns.

## 2021-02-13 NOTE — Telephone Encounter (Signed)
Transition Care Management Follow-up Telephone Call Date of discharge and from where: 02/10/21 from [facility] How have you been since you were released from the hospital? Taking in the news Any questions or concerns? No  Items Reviewed: Did the pt receive and understand the discharge instructions provided? Yes  Medications obtained and verified? Yes  Other? No  Any new allergies since your discharge? No  Dietary orders reviewed? Yes Do you have support at home? Yes   Home Care and Equipment/Supplies: Were home health services ordered? not applicable If so, what is the name of the agency?   Has the agency set up a time to come to the patient's home? not applicable Were any new equipment or medical supplies ordered?  No What is the name of the medical supply agency?  Were you able to get the supplies/equipment? not applicable Do you have any questions related to the use of the equipment or supplies? No  Functional Questionnaire: (I = Independent and D = Dependent) ADLs: I   Bathing/Dressing- I  Meal Prep- I  Eating- I  Maintaining continence- I  Transferring/Ambulation- I  Managing Meds- I  Follow up appointments reviewed:  PCP Hospital f/u appt confirmed? No  Scheduled  Specialist Hospital f/u appt confirmed? Yes  Scheduled to see lab 02/21/21  Are transportation arrangements needed? No  If their condition worsens, is the pt aware to call PCP or go to the Emergency Dept.? Yes Was the patient provided with contact information for the PCP's office or ED? Yes Was to pt encouraged to call back with questions or concerns? Yes

## 2021-02-13 NOTE — Progress Notes (Signed)
Spoke with patient regarding urgent referral we received from Dr. Michaelle Birks.  I offered for patient to come in this morning to be here no later than 9:15 am to see our PA Cassandra Heilingoetter and Dr. Truitt Merle.  She is aware of our location and she knows she may bring one person with her to the consult.

## 2021-02-13 NOTE — Patient Instructions (Addendum)
Summary:  -The sample (biopsy) that they took of your tumor was consistent with pancreatic cancer, called adenocarcinoma.  -We covered a lot of important information at your appointment today regarding what the treatment plan is moving forward. Here are the the main points that were discussed at your office visit with [place] today:  -The treatment that you will receive consists of 4 chemotherapy drugs.  -We are planning on starting your treatment next week on 02/24/21 but before your start your treatment, I would like you to attend a Chemotherapy Education Class. This involves having you sit down with one of our nurse educators. She will discuss with your one-on-one more details about your treatment as well as general information about resources here at the cancer center.  -Your treatment will be given once every 2 weeks but you return 2 days after treatment to have your pump removed. The plan is to complete 4 months of treatment (8 treatments total) then be re-evaluated by surgery to remove the tumor (whipple procedure). Since the tumor is close to a major blood vessel, the goal is to have chemotherapy shrink the tumor so it is easier to remove. This is also the best chance for possible cure.  -We will get a CT scan after you complete treatment to check on the progress of treatment.   Medications:  -I have sent a few important medication prescriptions to your pharmacy.  -Compazine was sent to your pharmacy. This medication is for nausea. You may take this every 6 hours as needed if you feel nauseous.  -You also have a prescription for zofran, you may take this every 8 hours as needed for nausea, but starting after day 3 of treatment.    Referrals or Imaging: -I will arrange for a PET scan to further evaluate the liver and the lung -Consider a referral to genetic counseling -I will also refer you to the nutritionist to evaluate you.   Follow up:  -We will see you back for a follow up visit 1 week for  your first treatment   -If you need to reach [place] at any time, the main office number to the cancer center is [phone], when you call, ask to speak [name] nurse.     It is common for patients who are undergoing treatment and taking certain prescribed medications to experience side-effects with constipation.  If you experience constipation, please take stool softener such as Colace or Senna one tablet twice a day everyday to avoid constipation.  These medications are available over the counter.  Of course, if you have diarrhea, stop taking stool softeners.  Drinking plenty of fluid, eating fruits and vegetable, and being active also reduces the risk of constipation.   If despite taking stool softeners, and you still have no bowel movement for 2 days or more than your normal bowel habit frequency, please take one of the following over the counter laxatives:  MiraLax, Milk of Magnesia or Mag Citrate everyday. The goal is to have at least one bowel movement every other day.

## 2021-02-13 NOTE — Progress Notes (Signed)
[place] [phone]   Fax:([phone]  CONSULT NOTE  REFERRING PHYSICIAN: Dr. Zenia Resides   REASON FOR CONSULTATION:  Pancreatic Cancer  HPI Robin [name] is a 55 y.o. female with a past medical history significant for bipolar depression, migraines, allergies, asthma, osteoarthritis, and irritable bowel syndrome is referred to the clinic for newly diagnosed pancreatic cancer.  The patient's evaluation began on 01/11/2021 when the patient presented to the emergency room with the chief complaint of a few days of fatigue, epigastric pain associated with poor p.o. intake, and some anterior chest discomfort.  The patient originally attributed her symptoms to reflux; however, she had significant lab abnormalities including elevated LFTs with an AST at 224, an ALT at 505, and elevated bilirubin at 6.7. The patient's lipase was normal.  Her hemoglobin was normal at 13.6. She not have evidence of leukocytosis.  Her alk phos was within normal limits.  She had a CT scan performed that day which showed diffuse biliary ductal dilation, with soft tissue prominence in the region of the pancreatic head. There were also multiple low-attenuation liver lesions for which she had an MRI abdomen MRCP with and without contrast which favored this as sclerosed hemangioma. The MRI abdomen MRCP showed biliary duct distension of both intra and extrahepatic biliary tree with abrupt caliber change but with no visible mass on imaging.   The patient subsequently had an ERCP performed by Dr. Carlean Purl on 01/13/21 in which a sphincterotomy and placement of a plastic biliary stent was placed. Cytology ([id number]) was suspicious for malignancy from the common bile duct brushings.   Her baseline CA 19.9 was 72.  The patient presented to the ER again on 01/25/21 for abdominal discomfort. She was discharged with outpatient follow up with GI.   The patient met with her surgeon, Dr. Zenia Resides on 02/08/21 to  discuss surgery (whipple procedure) for suspected cholangiocarcinoma, although early pancreatic cancer is could be a possibility. She was having severe/worsening  upper abdominal pain with associated nausea/vomiting that day. Due to the degree of pain that she was having, Dr. Zenia Resides arranged for the patient to have a pancreas protocol CT abd/pelvis for surgical planning and to rule out post-ERCP pancreatitis. Her bilirubin was found to be elevated compared to her prior bili drawn at her prior hospital discharge. Therefore, they directly admitted her for possible stent occlusion.   She was hospitalized from 02/08/21-02/11/21. Her CT of the chest, abdomen, and pelvis showed a hypovascular area in the pancreatic head measuring 1.9 x 2.1 x 2.5 cm adjacent to the common bile duct. The described pancreatic lesion is well separate from the superior mesenteric artery, superior mesenteric vein and common hepatic artery, but is in direct contact with the anterior surface of the inferior vena cava. There was also 1.2 x 0.7 cm ground-glass attenuation nodule in the right upper lobe. Once again, there were multiple liver lesions, the smallest of which noted to likely represent tiny simple cysts. The larger lesion in the left lobe of the liver was noted to once again most suggestive of a cavernous hemangioma.  The patient underwent an EUS and biopsy and ERCP for stent exchange on 02/09/21 under the care of Dr. Rush Landmark. The EUS showed a mass like region in the pancreatic head where the CBD narrowed and where the upstream PD dilation was noted. The endosonographic appearance was suggestive of potential pancreatic adenocarcinoma (T2, N0, Mx based on imaging). The final pathology showed ([id number]) malignant cells consistent with adenocarcinoma.  The patient is scheduled for port-cath placement by Dr. Zenia Resides on 02/23/21.  Overall, the patient reports fatigue. For abdominal pain, she states she sometimes has intermittent  RUQ pain which is throbbing in nature and does not radiate. She rates her pain a 2-3/10. She takes oxycodone 1-2x daily for pain and states she was also told to take tylenol as well but has not needed to take any yet. She reports a decreased appetite but it is improving. She was previously instructed to drink 3 supplemental drinks per day but is presently drinking one. She states she lost 28 lbs over the course of the last 2 months. She denies any nausea/vomiting since being admitted on 02/08/21 for the repeat ERCP and EUS. She has a prescription for zofran. She reports constipation which may be secondary to her pain medication use. She is not using bowel prophylaxis. She states she goes several days without a bowel movement.   The patient's family history is significant for cancer on her maternal side. Her great grandmother, grandmother, and 2 sisters all had breast cancer. She believes her sisters were in their 55's when they were diagnosed. Her mother's medical history consists of an enlarged heart, hypertension, and depression. The patient's father had colorectal cancer.   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 She is married. Her wife was available by phone today and is currently undergoing treatment for malignancy in the clinic as well. The patient has a daughter, 1 adopted son, and 1 step son. She denies tobacco or drug use. She averages drinking 5 alcoholic beverages per year.     HPI  Past Medical History:  Diagnosis Date   Asthma    Bipolar disorder ([place])    Diverticulosis    Gestational hypertension    HA (headache)    History of borderline personality disorder    Hypersomnia, persistent 04/29/2013   IBS (irritable bowel syndrome)    Memory loss, short term    [name] 04-03-13    Migraine    Reflux esophagitis    Seasonal allergies     Past Surgical History:  Procedure Laterality Date    ABDOMINAL HYSTERECTOMY  00   BILIARY BRUSHING  01/13/2021   Procedure: BILIARY BRUSHING;  Surgeon: Gatha Mayer, MD;  Location: [facility];  Service: Endoscopy;;   BILIARY DILATION  02/09/2021   Procedure: BILIARY DILATION;  Surgeon: Irving Copas., MD;  Location: [place];  Service: Gastroenterology;;   BILIARY STENT PLACEMENT N/A 01/13/2021   Procedure: BILIARY STENT PLACEMENT;  Surgeon: Gatha Mayer, MD;  Location: [facility];  Service: Endoscopy;  Laterality: N/A;   BILIARY STENT PLACEMENT  02/09/2021   Procedure: BILIARY STENT PLACEMENT;  Surgeon: Irving Copas., MD;  Location: [place];  Service: Gastroenterology;;   BIOPSY  01/13/2021   Procedure: BIOPSY;  Surgeon: Gatha Mayer, MD;  Location: [facility];  Service: Endoscopy;;   BIOPSY  02/09/2021   Procedure: BIOPSY;  Surgeon: Irving Copas., MD;  Location: [place];  Service: Gastroenterology;;   COLONOSCOPY  01/2020   ERCP N/A 01/13/2021   Procedure: ENDOSCOPIC RETROGRADE CHOLANGIOPANCREATOGRAPHY (ERCP);  Surgeon: Gatha Mayer, MD;  Location: [facility];  Service: Endoscopy;  Laterality: N/A;   ERCP N/A 02/09/2021   Procedure: ENDOSCOPIC RETROGRADE CHOLANGIOPANCREATOGRAPHY (ERCP);  Surgeon: Irving Copas., MD;  Location: [place];  Service: Gastroenterology;  Laterality: N/A;   ESOPHAGOGASTRODUODENOSCOPY (EGD) WITH  PROPOFOL N/A 02/09/2021   Procedure: ESOPHAGOGASTRODUODENOSCOPY (EGD) WITH PROPOFOL;  Surgeon: Rush Landmark Telford Nab., MD;  Location: [place];  Service: Gastroenterology;  Laterality: N/A;   EUS N/A 02/09/2021   Procedure: UPPER ENDOSCOPIC ULTRASOUND (EUS) LINEAR;  Surgeon: Irving Copas., MD;  Location: [place];  Service: Gastroenterology;  Laterality: N/A;   FINE NEEDLE ASPIRATION  02/09/2021   Procedure: FINE NEEDLE ASPIRATION (FNA) LINEAR;  Surgeon: Irving Copas., MD;  Location: [facility];  Service: Gastroenterology;;   KNEE  SURGERY Right 96,98,00,02,05,2014   NASAL SINUS SURGERY     NOSE SURGERY     [id number]   SPHINCTEROTOMY  01/13/2021   Procedure: SPHINCTEROTOMY;  Surgeon: Gatha Mayer, MD;  Location: [facility];  Service: Endoscopy;;   STENT REMOVAL  02/09/2021   Procedure: STENT REMOVAL;  Surgeon: Irving Copas., MD;  [facility]: [facility];  Service: Gastroenterology;;   WRIST SURGERY Right [phone]    Family History  Problem Relation Age of Onset   Depression Mother    High Cholesterol Mother    Hypertension Mother    Heart failure Mother    Hypertension Father    High blood pressure Maternal Grandfather    Heart attack Maternal Grandfather    Breast cancer Maternal Grandmother    Diabetes Maternal Grandmother    High blood pressure Maternal Grandmother    Heart disease Maternal Grandmother    Breast cancer Sister    Schizophrenia Brother    Colon cancer Neg Hx    Esophageal cancer Neg Hx    Rectal cancer Neg Hx    Stomach cancer Neg Hx     Social History Social History   Tobacco Use   Smoking status: Never   Smokeless tobacco: Never  Vaping Use   Vaping Use: Never used  Substance Use Topics   Alcohol use: No    Comment: 2-3 drinks per year   Drug use: No    Allergies  Allergen Reactions   Aspirin Nausea And Vomiting and Rash   Bee Venom Anaphylaxis   Penicillins Anaphylaxis   Bactrim [Sulfamethoxazole-Trimethoprim] Nausea And Vomiting   Sulfa Antibiotics Nausea And Vomiting    Current Outpatient Medications  Medication Sig Dispense Refill   azelastine (ASTELIN) 0.1 % nasal spray Place 2 sprays into both nostrils 2 (two) times daily. 30 mL 12   clobetasol cream (TEMOVATE) 7.40 % Apply 1 application topically 2 (two) times daily. 30 g 0   cyclobenzaprine (FLEXERIL) 5 MG tablet TAKE 1 TABLET(5 MG) BY MOUTH AT BEDTIME AS NEEDED FOR LOWER BACK PAIN OR SEVERE HEADACHE 30 tablet 0   dicyclomine (BENTYL) 10 MG capsule Take 1 capsule (10 mg total) by mouth in the  morning and at bedtime. 60 capsule 0   EPINEPHrine 0.3 mg/0.3 mL IJ SOAJ injection Inject 0.3 mg into the muscle as needed for anaphylaxis. INJECT 0.3 MLS (0.3 MG TOTAL) INTO THE MUSCLE ONCE 1 each 0   fluticasone (FLONASE) 50 MCG/ACT nasal spray Place 1 spray into both nostrils 2 (two) times daily.     gabapentin (NEURONTIN) 300 MG capsule Take 300 mg by mouth daily as needed (pain).     LATUDA 40 MG TABS tablet Take 1 tablet (40 mg total) by mouth daily with breakfast. 90 tablet 0   lidocaine-prilocaine (EMLA) cream Apply 1 application topically as needed. 30 g 2   montelukast (SINGULAIR) 10 MG tablet Take 10 mg by mouth daily.     Multiple Vitamin (MULTIVITAMIN WITH MINERALS) TABS tablet Take  1 tablet by mouth daily. 30 tablet 2   ondansetron (ZOFRAN) 8 MG tablet Take 1 tablet (8 mg total) by mouth every 8 (eight) hours as needed for nausea or vomiting. Starting on day 3 after chemotherapy 30 tablet 2   oxyCODONE (OXY IR/ROXICODONE) 5 MG immediate release tablet Take 1 tablet (5 mg total) by mouth every 4 (four) hours as needed for severe pain. 30 tablet 0   prochlorperazine (COMPAZINE) 10 MG tablet Take 1 tablet (10 mg total) by mouth every 6 (six) hours as needed. 30 tablet 2   No current facility-administered medications for this visit.    REVIEW OF SYSTEMS:   Review of Systems  Constitutional: Positive for fatigue, decreased appetite, weight loss.  Negative for chills and fever. HENT: Negative for mouth sores, nosebleeds, sore throat and trouble swallowing.   Eyes: Negative for eye problems and icterus.  Respiratory: Negative for cough, hemoptysis, shortness of breath and wheezing.   Cardiovascular: Negative for chest pain and leg swelling.  Gastrointestinal: Positive for intermittent RUQ/epigastric pain and constipation. Negative for diarrhea, nausea, and vomiting (resolved at this time).  Genitourinary: Negative for bladder incontinence, difficulty urinating, dysuria, frequency and  hematuria.   Musculoskeletal: Negative for back pain, gait problem, neck pain and neck stiffness.  Skin: Positive for generalized itching. Negative for rash.  Neurological: Negative for dizziness, extremity weakness, gait problem, headaches, light-headedness and seizures.  Hematological: Negative for adenopathy. Does not bruise/bleed easily.  Psychiatric/Behavioral: Negative for confusion, depression and sleep disturbance. The patient is not nervous/anxious.     PHYSICAL EXAMINATION:  Blood pressure (!) 149/78, pulse 64, temperature (!) 97.5 F (36.4 C), temperature source Tympanic, resp. rate 19, height 5' 9"  (1.753 m), weight 207 lb 3.2 oz (94 kg), SpO2 100 %.  ECOG PERFORMANCE STATUS: 1  Physical Exam  Constitutional: Oriented to person, place, and time and well-developed, well-nourished, and in no distress.  HENT:  Head: Normocephalic and atraumatic.  Mouth/Throat: Oropharynx is clear and moist. No oropharyngeal exudate.  Eyes: Conjunctivae are normal. Right eye exhibits no discharge. Left eye exhibits no discharge. No scleral icterus.  Neck: Normal range of motion. Neck supple.  Cardiovascular: Normal rate, regular rhythm, normal heart sounds and intact distal pulses.   Pulmonary/Chest: Effort normal and breath sounds normal. No respiratory distress. No wheezes. No rales.  Abdominal: Soft. Bowel sounds are normal. Exhibits no distension and no mass. Mild tenderness in RUQ.  Musculoskeletal: Normal range of motion. Exhibits no edema.  Lymphadenopathy:    No cervical adenopathy.  Neurological: Alert and oriented to person, place, and time. Exhibits normal muscle tone. Gait normal. Coordination normal.  Skin: Skin is warm and dry. No rash noted. Not diaphoretic. No erythema. No pallor.  Psychiatric: Mood, memory and judgment normal.  Vitals reviewed.  LABORATORY DATA: Lab Results  Component Value Date   WBC 14.7 (H) 02/10/2021   HGB 12.4 02/10/2021   HCT 39.3 02/10/2021   MCV  86.9 02/10/2021   PLT 311 02/10/2021      Chemistry      Component Value Date/Time   NA 138 02/10/2021 0042   K 3.8 02/10/2021 0042   CL 104 02/10/2021 0042   CO2 24 02/10/2021 0042   BUN 8 02/10/2021 0042   CREATININE 1.00 02/10/2021 0042   CREATININE 1.14 (H) 03/18/2017 1553      Component Value Date/Time   CALCIUM 9.4 02/10/2021 0042   ALKPHOS 157 (H) 02/10/2021 0042   AST 91 (H) 02/10/2021 0042   ALT  174 (H) 02/10/2021 0042   BILITOT 1.4 (H) 02/10/2021 0042       RADIOGRAPHIC STUDIES: CT ABDOMEN PELVIS W WO CONTRAST  Result Date: 02/09/2021 CLINICAL DATA:  55 year old female with history of common bile duct stricture with suspicious findings on recent biopsy concerning for potential malignant stricture. Staging examination. EXAM: CT CHEST WITHOUT CONTRAST AND CT OF ABDOMEN AND PELVIS WITH CONTRAST TECHNIQUE: Multidetector CT imaging of the chest was performed following the standard protocol. Multidetector CT imaging of the abdomen and pelvis was performed following the standard protocol during bolus administration of intravenous contrast. CONTRAST:  139m OMNIPAQUE IOHEXOL 300 MG/ML  SOLN COMPARISON:  CT the abdomen and pelvis 01/11/2021. Abdominal MRI 01/12/2021. FINDINGS: CT CHEST FINDINGS Cardiovascular: Heart size is normal. There is no significant pericardial fluid, thickening or pericardial calcification. There is aortic atherosclerosis, as well as atherosclerosis of the great vessels of the mediastinum and the coronary arteries, including calcified atherosclerotic plaque in the left anterior descending coronary artery. Mediastinum/Nodes: No pathologically enlarged mediastinal or hilar lymph nodes. Please note that accurate exclusion of hilar adenopathy is limited on noncontrast CT scans. Esophagus is unremarkable in appearance. No axillary lymphadenopathy. Lungs/Pleura: In the posterolateral aspect of the right upper lobe (axial image 57 of series 4) there is a 1.2 x 0.7 cm  ground-glass attenuation nodule. No other suspicious appearing pulmonary nodules or masses are noted. No acute consolidative airspace disease. No pleural effusions. Musculoskeletal: There are no aggressive appearing lytic or blastic lesions noted in the visualized portions of the skeleton. CT ABDOMEN PELVIS FINDINGS Hepatobiliary: In the left lobe of the liver between segments 2 and 4A there is a 3.4 x 2.9 cm low-attenuation lesion (axial image 9 of series 3) which demonstrates some early peripheral nodular enhancement with some progressive centripetal filling, favored to represent a cavernous hemangioma. Other subcentimeter low-attenuation lesions in the liver are too small to definitively characterize, but appear to represent tiny cysts based on comparison with prior abdominal MRI. No other suspicious appearing hepatic lesions. No intrahepatic biliary ductal dilatation. Pneumobilia new compared to the prior study related to indwelling common bile duct stent which appears appropriately located. Common bile duct remains dilated measuring up to 1.5 cm in diameter. Gallbladder is largely decompressed, leading to apparent wall thickening (6 mm). No cholelithiasis. No pericholecystic fluid or surrounding inflammatory changes. Small amount of pneumobilia within the gallbladder. Pancreas: In the posterior aspect of the pancreatic head (axial image 50 of series 6 and coronal image 39 of series 18) there is a hypovascular area noted predominantly during arterial phase imaging measuring 1.9 x 2.1 x 2.5 cm, immediately adjacent to the common bile duct in the area where the previously noted stricture was identified. The dilated common bile duct abruptly terminates immediately above this potential lesion. This is associated with dilatation of the main pancreatic duct which measures up to 5 mm, which also appears to abruptly terminate in the region of the suspected lesion. No peripancreatic fluid collections or inflammatory  changes. Spleen: Unremarkable. Adrenals/Urinary Tract: Bilateral kidneys and adrenal glands are normal in appearance. No hydroureteronephrosis. Urinary bladder is normal in appearance. Stomach/Bowel: The appearance of the stomach is normal. No pathologic dilatation of small bowel or colon. Normal appendix. Vascular/Lymphatic: Atherosclerotic calcifications in the pelvic vasculature. No aneurysm or dissection noted in the abdominal or pelvic vasculature. Previously described pancreatic lesion is well separate from the superior mesenteric artery, superior mesenteric vein and common hepatic artery, but is in direct contact with the anterior surface of the  inferior vena cava. Reproductive: Status post hysterectomy. Ovaries are unremarkable in appearance. Other: No significant volume of ascites.  No pneumoperitoneum. Musculoskeletal: There are no aggressive appearing lytic or blastic lesions noted in the visualized portions of the skeleton. IMPRESSION: 1. Findings are highly suspicious for a hypovascular mass in the posterior aspect of the head of the pancreas, as detailed above. This appears associated with persistent narrowing of the distal common bile duct and abrupt termination of the pancreatic duct. Common bile duct stent appears appropriately located, but is associated with some persistent dilatation of the common bile duct. 2. 1.2 x 0.7 cm ground-glass attenuation nodule in the right upper lobe. Initial follow-up with CT at 6-12 months is recommended to confirm persistence. If persistent, repeat CT is recommended every 2 years until 5 years of stability has been established. This recommendation follows the consensus statement: Guidelines for Management of Incidental Pulmonary Nodules Detected on CT Images: From the Fleischner Society 2017; Radiology 2017; 284:228-243. 3. Multiple liver lesions, smallest of which likely represent tiny simple cysts. The larger lesion in the left lobe of the liver has imaging  characteristics once again most suggestive of a cavernous hemangioma. 4. Aortic atherosclerosis, in addition to left anterior descending coronary artery disease. Please note that although the presence of coronary artery calcium documents the presence of coronary artery disease, the severity of this disease and any potential stenosis cannot be assessed on this non-gated CT examination. Assessment for potential risk factor modification, dietary therapy or pharmacologic therapy may be warranted, if clinically indicated. 5. Additional incidental findings, as above. Electronically Signed   By: Vinnie Langton M.D.   On: 02/09/2021 10:57   CT CHEST WO CONTRAST  Result Date: 02/09/2021 CLINICAL DATA:  55 year old female with history of common bile duct stricture with suspicious findings on recent biopsy concerning for potential malignant stricture. Staging examination. EXAM: CT CHEST WITHOUT CONTRAST AND CT OF ABDOMEN AND PELVIS WITH CONTRAST TECHNIQUE: Multidetector CT imaging of the chest was performed following the standard protocol. Multidetector CT imaging of the abdomen and pelvis was performed following the standard protocol during bolus administration of intravenous contrast. CONTRAST:  149m OMNIPAQUE IOHEXOL 300 MG/ML  SOLN COMPARISON:  CT the abdomen and pelvis 01/11/2021. Abdominal MRI 01/12/2021. FINDINGS: CT CHEST FINDINGS Cardiovascular: Heart size is normal. There is no significant pericardial fluid, thickening or pericardial calcification. There is aortic atherosclerosis, as well as atherosclerosis of the great vessels of the mediastinum and the coronary arteries, including calcified atherosclerotic plaque in the left anterior descending coronary artery. Mediastinum/Nodes: No pathologically enlarged mediastinal or hilar lymph nodes. Please note that accurate exclusion of hilar adenopathy is limited on noncontrast CT scans. Esophagus is unremarkable in appearance. No axillary lymphadenopathy.  Lungs/Pleura: In the posterolateral aspect of the right upper lobe (axial image 57 of series 4) there is a 1.2 x 0.7 cm ground-glass attenuation nodule. No other suspicious appearing pulmonary nodules or masses are noted. No acute consolidative airspace disease. No pleural effusions. Musculoskeletal: There are no aggressive appearing lytic or blastic lesions noted in the visualized portions of the skeleton. CT ABDOMEN PELVIS FINDINGS Hepatobiliary: In the left lobe of the liver between segments 2 and 4A there is a 3.4 x 2.9 cm low-attenuation lesion (axial image 9 of series 3) which demonstrates some early peripheral nodular enhancement with some progressive centripetal filling, favored to represent a cavernous hemangioma. Other subcentimeter low-attenuation lesions in the liver are too small to definitively characterize, but appear to represent tiny cysts based on comparison  with prior abdominal MRI. No other suspicious appearing hepatic lesions. No intrahepatic biliary ductal dilatation. Pneumobilia new compared to the prior study related to indwelling common bile duct stent which appears appropriately located. Common bile duct remains dilated measuring up to 1.5 cm in diameter. Gallbladder is largely decompressed, leading to apparent wall thickening (6 mm). No cholelithiasis. No pericholecystic fluid or surrounding inflammatory changes. Small amount of pneumobilia within the gallbladder. Pancreas: In the posterior aspect of the pancreatic head (axial image 50 of series 6 and coronal image 39 of series 18) there is a hypovascular area noted predominantly during arterial phase imaging measuring 1.9 x 2.1 x 2.5 cm, immediately adjacent to the common bile duct in the area where the previously noted stricture was identified. The dilated common bile duct abruptly terminates immediately above this potential lesion. This is associated with dilatation of the main pancreatic duct which measures up to 5 mm, which also  appears to abruptly terminate in the region of the suspected lesion. No peripancreatic fluid collections or inflammatory changes. Spleen: Unremarkable. Adrenals/Urinary Tract: Bilateral kidneys and adrenal glands are normal in appearance. No hydroureteronephrosis. Urinary bladder is normal in appearance. Stomach/Bowel: The appearance of the stomach is normal. No pathologic dilatation of small bowel or colon. Normal appendix. Vascular/Lymphatic: Atherosclerotic calcifications in the pelvic vasculature. No aneurysm or dissection noted in the abdominal or pelvic vasculature. Previously described pancreatic lesion is well separate from the superior mesenteric artery, superior mesenteric vein and common hepatic artery, but is in direct contact with the anterior surface of the inferior vena cava. Reproductive: Status post hysterectomy. Ovaries are unremarkable in appearance. Other: No significant volume of ascites.  No pneumoperitoneum. Musculoskeletal: There are no aggressive appearing lytic or blastic lesions noted in the visualized portions of the skeleton. IMPRESSION: 1. Findings are highly suspicious for a hypovascular mass in the posterior aspect of the head of the pancreas, as detailed above. This appears associated with persistent narrowing of the distal common bile duct and abrupt termination of the pancreatic duct. Common bile duct stent appears appropriately located, but is associated with some persistent dilatation of the common bile duct. 2. 1.2 x 0.7 cm ground-glass attenuation nodule in the right upper lobe. Initial follow-up with CT at 6-12 months is recommended to confirm persistence. If persistent, repeat CT is recommended every 2 years until 5 years of stability has been established. This recommendation follows the consensus statement: Guidelines for Management of Incidental Pulmonary Nodules Detected on CT Images: From the Fleischner Society 2017; Radiology 2017; 284:228-243. 3. Multiple liver lesions,  smallest of which likely represent tiny simple cysts. The larger lesion in the left lobe of the liver has imaging characteristics once again most suggestive of a cavernous hemangioma. 4. Aortic atherosclerosis, in addition to left anterior descending coronary artery disease. Please note that although the presence of coronary artery calcium documents the presence of coronary artery disease, the severity of this disease and any potential stenosis cannot be assessed on this non-gated CT examination. Assessment for potential risk factor modification, dietary therapy or pharmacologic therapy may be warranted, if clinically indicated. 5. Additional incidental findings, as above. Electronically Signed   By: Vinnie Langton M.D.   On: 02/09/2021 10:57   US Abdomen Limited  Result Date: 01/25/2021 CLINICAL DATA:  Right upper quadrant pain. EXAM: ULTRASOUND ABDOMEN LIMITED RIGHT UPPER QUADRANT COMPARISON:  ERCP 01/13/2021. Ultrasound 01/12/2021. MRI 01/12/2021. CT 01/11/2021. FINDINGS: Gallbladder: No gallstones. Gallbladder wall thickening to 3.9 mm. Negative Murphy sign. Common bile duct: Diameter: 2.1  mm. Interval improvement of intrahepatic biliary distention. Liver: Increased echogenicity consistent fatty infiltration or hepatocellular disease. Echogenic foci within the liver may represent intra biliary air from recent ERCP. A single hyperechoic 3.2 cm mass is noted in the anterior aspect of the liver. This appears stable from prior exam. No other hyperechoic lesions noted on today's exam. Complex cyst measuring 2.3 cm noted in the region of the pancreatic head. Portal vein is patent on color Doppler imaging with normal direction of blood flow towards the liver. Other: None. IMPRESSION: 1. No gallstones. Gallbladder wall thickening to 3.9 mm. Negative Murphy sign. 2. Common bile duct measures 2.1 mm. No common bile duct dilatation noted on today's exam. Interim improvement of intrahepatic biliary ductal dilatation.  Intrahepatic biliary ductal air may be present, most likely from recent ERCP. 3. Increased hepatic echogenicity consistent with fatty infiltration or hepatocellular disease. A single hyperechoic 3.2 cm mass is again noted in the anterior aspect of the liver, no significant change. Again this possibly represents a hemangioma. Reference is made to prior MRI report of 01/12/2021. No other hyperechoic hepatic lesions noted on today's exam. 4. Complex cyst measuring 2.3 cm noted in the region of the pancreatic head. Electronically Signed   By: Marcello Moores  Register   On: 01/25/2021 06:01   DG ERCP  Result Date: 02/09/2021 CLINICAL DATA:  55 year old female with a history of indwelling biliary stent EXAM: ERCP TECHNIQUE: Multiple spot images obtained with the fluoroscopic device and submitted for interpretation post-procedure. FLUOROSCOPY TIME:  Fluoroscopy Time:  3 minutes 15 seconds COMPARISON:  None. FINDINGS: Limited intraoperative fluoroscopic spot images during ERCP. Initial image demonstrates endoscope projecting over the upper abdomen with a plastic biliary stent in position. Gas within the biliary tree. Subsequently there is removal of a plastic biliary stent. There is then cannulation of the ampulla with the wire, partial opacification of the extrahepatic biliary system with contrast, and placement of a metallic biliary stent. IMPRESSION: Limited images during ERCP demonstrates removal plastic biliary stent and placement of a new metallic biliary stent. Please refer to the dictated operative report for full details of intraoperative findings and procedure. Electronically Signed   By: Corrie Mckusick D.O.   On: 02/09/2021 16:52   DG Abd Portable 1V  Result Date: 01/25/2021 CLINICAL DATA:  Abdominal pain. History of biliary obstruction post stent placement. EXAM: PORTABLE ABDOMEN - 1 VIEW COMPARISON:  Right upper quadrant abdominal ultrasound-earlier same day; ERCP-01/14/2019; CT abdomen pelvis-01/11/2021  FINDINGS: Nonobstructive bowel gas pattern.  Moderate colonic stool burden. A biliary stent overlies expected location of the CBD. Minimal amount of expected pneumobilia. No definite pneumoperitoneum, pneumatosis or portal venous gas. No acute osseous abnormalities. IMPRESSION: 1. Moderate colonic stool burden without evidence of enteric obstruction. 2. Biliary stent overlies expected location of the CBD with expected pneumobilia. Electronically Signed   By: Sandi Mariscal M.D.   On: 01/25/2021 11:03    ASSESSMENT: This is a very pleasant 55 year old African-American female with:  1) Possibly resectable Adenocarcinoma of the pancreas diagnosed in June 2022, cT2N0Mx -The patient presented with fatigue, abdominal pain, and jaundice in May 2022. S/p biliary stent placement x2.  -Most recent CT from 02/08/21 showed a hypovascular area in the pancreatic head measuring 1.9 x 2.1x 2.5 cm adjacent to the common bile duct in direct contact with the anterior surface of the inferior vena cava. There was also 1.2 x 0.7 cm ground-glass attenuation nodule in the right upper lobe as well as multiple liver lesions, the smallest of  which noted to likely represent tiny simple cysts. The larger lesion in the left lobe of the liver was noted to be most suggestive of a cavernous hemangioma. -Baseline CA 19.9 was 72 -The EUS showed a mass like region in the pancreatic head where the CBD narrowed and where the upstream PD dilation was noted. The endosonographic appearance was suggestive of potential pancreatic adenocarcinoma (T2, N0, Mx). The final pathology showed ([id number]) malignant cells consistent with adenocarcinoma.  -The patient was seen by [name] today. [name] discussed that this is likely resectable but she would recommend a PET scan to further evaluate the liver and lung lesion since pancreatic cancer has a high risk of metastasis. However, [name] believes the liver and lung lesion to be benign based on  imaging. -[name] recommended since the tumor is abutting the anterior surface of the inferior vena cava, she recommends 4 months (8 treatments) of neoadjuvant chemotherapy with FOLFIRINOXIV every 2 weeks. The goal is to shrink the tumor so the patient may be a candidate for resection under the care of Dr. Zenia Resides. -Dr. Grant Ruts discussed the adverse side effects of treatment including but not limited to cold sensitivity, peripheral neuropathy, fatigue, nausea, vomiting,  diarrhea, hair loss, neuropathy, renal and kidney dysfunction, neutropenic fever, and needed for blood transfusion were discussed with patient.  -The patient is interested in this option and she agrees to proceed.     -The patient is scheduled for port-a-cath placement on 02/23/21 -We will arrange for her first cycle of treatment to begin ~02/24/21 or early the following week.  -We will arrange for the patient to have a chemotherapy education class prior to starting her first cycle of treatment.  -We will see her back for a follow up visit in 1.5 weeks for evaluation before starting her first cycle of treatment.   2) Decreased appetite and weight loss  -The patients initial presenting symptom was decreased appetite and 20 pound weight loss in 2 months. -Her appetite is improving somewhat and she tries to eat 3 meals a day and drinks 1 boost/Ensure per day.  She was previously recommended to drink 3 supplemental drinks per day. -Patient encouraged to increase her oral intake and referral placed to nutritionist team.  3) Right upper quadrant/epigastric abdominal pain -Patient currently being treated with oxycodone 1-2 tablets per day and tylenol if needed -Pain is primarily epigastric/right upper quadrant and she rates her pain a 2-3 out of 10 fairly well controlled with current regimen  3) Constipation -May be secondary to pain medication use -Provided patient education on constipation management on LOS cultures reviewed with the  patient.  4) Family history for cancer  -Maternal great-grandmother, grandmother, and 2 sisters with breast cancer -The patient has 1 biologic daughter -[name] recommend referral to genetic counseling -The patient is not interested in referral to genetic counseling at this time but will call us if she changed her mind  5) Social  -The patient lives with her wife and has 1 adopted son and a step son -The patient works at [employer] ~5 hours per day -Discussed with the patient to let us know if she needs paperwork completed for any leave of absence for work    PLAN: -Port insertion on 02/23/21 -EMLA cream, Zofran, and compazine prescribed to pharmacy -Constipation education given -Referral to nutritionist -PET scan ordered  -F/U next week on ~02/24/21 for cycle #1 of treatment    Thank you so much for allowing me to  participate in the care of Robin [name]. I will continue to follow up the patient with you and assist in her care.  Disclaimer: This note was dictated with voice recognition software. Similar sounding words can inadvertently be transcribed and may not be corrected upon review.   Doretha Goding L Joshaua Epple February 13, 2021, 12:54 PM   Addendum  I have seen the patient, examined her. I agree with the assessment and and plan and have edited the notes.   Robin [name] is a 55 year old presented with obstructive jaundice.  I woke up and reviewed a 1.9 x 2.1 x 2 point Mass in the head of the pancreas and a biopsy confirmed adenocarcinoma.  The lesion abuts the portal vein.  I personally reviewed negative CT and MRI images, and discussed with patient.  She has an indeterminate right lung nodule, and multiple liver lesions, likely benign.  I recommend a PET scan for further evaluation of recurrent lung and liver lesions.  If there is no concern for distant metastasis on PET scan, I recommended neoadjuvant chemotherapy for pancreatic cancer.  I discussed the natural history of  pancreatic cancer, and high risk of recurrence after surgical resection.  Given her young age and good performance status, I recommend FOLFIRINOX as neoadjuvant regiment, potential benefits and side effects discussed with patient in detail, she agrees to proceed.  She will have a port placement by Dr. Zenia Resides next week, and we plan to start chemo after port placement.  Given her strong family history of cancer, I also recommend genetic testing.  All questions were answered, plan to see her back after PET and before first chemo treatment.  Truitt Merle  02/13/2021

## 2021-02-14 ENCOUNTER — Other Ambulatory Visit: Payer: Self-pay | Admitting: Hematology

## 2021-02-14 ENCOUNTER — Ambulatory Visit: Payer: Self-pay | Admitting: Surgery

## 2021-02-14 DIAGNOSIS — C250 Malignant neoplasm of head of pancreas: Secondary | ICD-10-CM

## 2021-02-14 MED ORDER — ONDANSETRON HCL 8 MG PO TABS: 8.0000 mg | ORAL_TABLET | Freq: Two times a day (BID) | ORAL | 1 refills | Status: DC | PRN

## 2021-02-14 MED ORDER — PROCHLORPERAZINE MALEATE 10 MG PO TABS: 10.0000 mg | ORAL_TABLET | Freq: Four times a day (QID) | ORAL | 1 refills | Status: DC | PRN

## 2021-02-14 MED ORDER — LIDOCAINE-PRILOCAINE 2.5-2.5 % EX CREA: TOPICAL_CREAM | CUTANEOUS | 3 refills | Status: DC

## 2021-02-14 NOTE — Progress Notes (Signed)
START ON PATHWAY REGIMEN - Pancreatic Adenocarcinoma ° ° °  A cycle is every 14 days: °    Oxaliplatin  °    Leucovorin  °    Irinotecan  °    Fluorouracil  ° °**Always confirm dose/schedule in your pharmacy ordering system** ° °Patient Characteristics: °Preoperative (Clinical Staging), Borderline Resectable, PS = 0,1, BRCA1/2 and PALB2 Mutation Absent/Unknown °Therapeutic Status: Preoperative (Clinical Staging) °AJCC T Category: cT2 °AJCC N Category: cN0 °Resectability Status: Borderline Resectable °AJCC M Category: cM0 °AJCC 8 Stage Grouping: IB °ECOG Performance Status: 0 °BRCA1/2 Mutation Status: Awaiting Test Results °PALB2 Mutation Status: Awaiting Test Results °Intent of Therapy: °Curative Intent, Discussed with Patient °

## 2021-02-15 ENCOUNTER — Other Ambulatory Visit: Payer: Self-pay

## 2021-02-15 ENCOUNTER — Encounter (HOSPITAL_COMMUNITY): Payer: Self-pay | Admitting: Surgery

## 2021-02-15 ENCOUNTER — Telehealth: Payer: Self-pay | Admitting: Hematology

## 2021-02-15 NOTE — Progress Notes (Signed)
COVID Vaccine Completed: Yes x3 Date COVID Vaccine completed: 11-18-19, 12-10-19 Has received booster:  Yes x1 COVID vaccine manufacturer: Pfizer      Date of COVID positive in last 90 days: No  PCP - Dimas Chyle, MD Cardiologist - Candee Furbish, MD  Chest x-ray - 01-11-21 Epic EKG - 01-13-21 Epic Stress Test - N/A ECHO - 07-22-17 Epic Cardiac Cath - N/A Pacemaker/ICD device last checked: Spinal Cord Stimulator: Non-Telemetry Monitor - 07-22-17 Epic  Sleep Study - pt denies CPAP -   Fasting Blood Sugar - N/A Checks Blood Sugar _____ times a day  Blood Thinner Instructions:  N/A Aspirin Instructions: Last Dose:  Activity level:  Can go up a flight of stairs and perform activities of daily living without stopping and without symptoms of chest pain or shortness of breath.   Patient does have abdominal discomfort that radiates to chest with diagnosis.   Anesthesia review: Hx of palpitations and chest pain evaluated by cardiology.  Hx of syncope.  OSA  Patient denies shortness of breath, fever, cough and chest pain at PAT appointment (Completed over the phone)  Patient verbalized understanding of instructions that were given to them at the PAT appointment. Patient was also instructed that they will need to review over the PAT instructions again at home before surgery.

## 2021-02-15 NOTE — Progress Notes (Signed)
Spoke with patient to review her upcoming appointments.  I scheduled her for chemo education class on Wednesday 6/29 at 2:00 per her request.

## 2021-02-15 NOTE — Telephone Encounter (Signed)
Scheduled follow-up appointments per 6/22 staff message. Patient is aware.

## 2021-02-16 ENCOUNTER — Telehealth: Payer: Self-pay | Admitting: Hematology

## 2021-02-16 NOTE — Telephone Encounter (Signed)
Spoke to patient regarding rescheduling upcoming appointments per 6/22 staff message. Patient stated she wanted to keep the appointments as is due to her wife's work schedule.

## 2021-02-16 NOTE — Anesthesia Preprocedure Evaluation (Addendum)
Anesthesia Evaluation  Patient identified by MRN, date of birth, ID band Patient awake    Reviewed: Allergy & Precautions, NPO status , Patient's Chart, lab work & pertinent test results  Airway Mallampati: II  TM Distance: >3 FB Neck ROM: Full    Dental  (+) Dental Advisory Given   Pulmonary asthma , sleep apnea ,    Pulmonary exam normal breath sounds clear to auscultation       Cardiovascular hypertension, Pt. on medications Normal cardiovascular exam Rhythm:Regular Rate:Normal  TTE 2018 Normal EF, no significant valvular abnormalities   Neuro/Psych  Headaches, PSYCHIATRIC DISORDERS Depression Bipolar Disorder    GI/Hepatic Neg liver ROS, PUD, GERD  Medicated and Controlled,  Endo/Other  negative endocrine ROS  Renal/GU negative Renal ROS  negative genitourinary   Musculoskeletal  (+) Arthritis ,   Abdominal (+) - obese,   Peds  Hematology negative hematology ROS (+)   Anesthesia Other Findings ERCP for biliary stent malfunction, abnormal LFTs, jaundice  Persistent abdominal pain, nausea, vomiting and rising LFTs in patient with presumed malignancy,  extrahepatic cholangiocarcinoma vs pancreatic.   Recent ERCP, placement plastic biliary stent.  ERCP today to r/o stent occlusion  Reproductive/Obstetrics negative OB ROS                            Anesthesia Physical  Anesthesia Plan  ASA: 3  Anesthesia Plan: General   Post-op Pain Management:    Induction: Intravenous  PONV Risk Score and Plan: 3 and Midazolam, Dexamethasone, Ondansetron and Treatment may vary due to age or medical condition  Airway Management Planned: LMA  Additional Equipment: None  Intra-op Plan:   Post-operative Plan: Extubation in OR  Informed Consent: I have reviewed the patients History and Physical, chart, labs and discussed the procedure including the risks, benefits and alternatives for the  proposed anesthesia with the patient or authorized representative who has indicated his/her understanding and acceptance.     Dental advisory given  Plan Discussed with: CRNA  Anesthesia Plan Comments:        Anesthesia Quick Evaluation

## 2021-02-17 ENCOUNTER — Ambulatory Visit (HOSPITAL_COMMUNITY)
Admission: RE | Admit: 2021-02-17 | Discharge: 2021-02-17 | Disposition: A | Discharge status: Home / Self Care | Payer: 59 | Attending: Surgery | Admitting: Surgery

## 2021-02-17 ENCOUNTER — Ambulatory Visit (HOSPITAL_COMMUNITY): Payer: 59 | Admitting: Physician Assistant

## 2021-02-17 ENCOUNTER — Ambulatory Visit (HOSPITAL_COMMUNITY): Payer: 59

## 2021-02-17 ENCOUNTER — Encounter (HOSPITAL_COMMUNITY): Payer: Self-pay | Admitting: Surgery

## 2021-02-17 ENCOUNTER — Encounter (HOSPITAL_COMMUNITY)
Admission: RE | Disposition: A | Discharge status: Home / Self Care | Payer: Self-pay | Source: Home / Self Care | Attending: Surgery

## 2021-02-17 DIAGNOSIS — Z95828 Presence of other vascular implants and grafts: Secondary | ICD-10-CM

## 2021-02-17 DIAGNOSIS — Z886 Allergy status to analgesic agent status: Secondary | ICD-10-CM | POA: Insufficient documentation

## 2021-02-17 DIAGNOSIS — Z881 Allergy status to other antibiotic agents status: Secondary | ICD-10-CM | POA: Diagnosis not present

## 2021-02-17 DIAGNOSIS — Z882 Allergy status to sulfonamides status: Secondary | ICD-10-CM | POA: Diagnosis not present

## 2021-02-17 DIAGNOSIS — Z880 Allergy status to penicillin: Secondary | ICD-10-CM | POA: Diagnosis not present

## 2021-02-17 DIAGNOSIS — Z803 Family history of malignant neoplasm of breast: Secondary | ICD-10-CM | POA: Diagnosis not present

## 2021-02-17 DIAGNOSIS — C250 Malignant neoplasm of head of pancreas: Secondary | ICD-10-CM | POA: Insufficient documentation

## 2021-02-17 HISTORY — PX: PORTACATH PLACEMENT: SHX2246

## 2021-02-17 HISTORY — DX: Malignant neoplasm of pancreas, unspecified: C25.9

## 2021-02-17 SURGERY — INSERTION, TUNNELED CENTRAL VENOUS DEVICE, WITH PORT
Anesthesia: General | Site: Chest | Laterality: Right

## 2021-02-17 MED ORDER — VANCOMYCIN HCL IN DEXTROSE 1-5 GM/200ML-% IV SOLN
1000.0000 mg | INTRAVENOUS | Status: AC
  Administered 2021-02-17: 1000 mg via INTRAVENOUS
  Filled 2021-02-17: qty 200

## 2021-02-17 MED ORDER — FENTANYL CITRATE (PF) 100 MCG/2ML IJ SOLN
INTRAMUSCULAR | Status: AC
  Filled 2021-02-17: qty 2

## 2021-02-17 MED ORDER — BUPIVACAINE-EPINEPHRINE 0.25% -1:200000 IJ SOLN
INTRAMUSCULAR | Status: DC | PRN
  Administered 2021-02-17: 14 mL

## 2021-02-17 MED ORDER — PROPOFOL 10 MG/ML IV BOLUS
INTRAVENOUS | Status: AC
  Filled 2021-02-17: qty 60

## 2021-02-17 MED ORDER — HEPARIN SOD (PORK) LOCK FLUSH 100 UNIT/ML IV SOLN
INTRAVENOUS | Status: DC | PRN
  Administered 2021-02-17: 500 [IU]

## 2021-02-17 MED ORDER — KETOROLAC TROMETHAMINE 30 MG/ML IJ SOLN
INTRAMUSCULAR | Status: DC | PRN
  Administered 2021-02-17: 30 mg via INTRAVENOUS

## 2021-02-17 MED ORDER — DEXAMETHASONE SODIUM PHOSPHATE 10 MG/ML IJ SOLN
INTRAMUSCULAR | Status: AC
  Filled 2021-02-17: qty 1

## 2021-02-17 MED ORDER — ONDANSETRON HCL 4 MG/2ML IJ SOLN
INTRAMUSCULAR | Status: AC
  Filled 2021-02-17: qty 2

## 2021-02-17 MED ORDER — MEPERIDINE HCL 50 MG/ML IJ SOLN: 6.2500 mg | INTRAMUSCULAR | Status: DC | PRN

## 2021-02-17 MED ORDER — PROMETHAZINE HCL 25 MG/ML IJ SOLN
6.2500 mg | INTRAMUSCULAR | Status: DC | PRN
Start: 2021-02-17 — End: 2021-02-17

## 2021-02-17 MED ORDER — BUPIVACAINE-EPINEPHRINE (PF) 0.25% -1:200000 IJ SOLN
INTRAMUSCULAR | Status: AC
  Filled 2021-02-17: qty 30

## 2021-02-17 MED ORDER — LIDOCAINE 2% (20 MG/ML) 5 ML SYRINGE
INTRAMUSCULAR | Status: AC
  Filled 2021-02-17: qty 5

## 2021-02-17 MED ORDER — LIDOCAINE 2% (20 MG/ML) 5 ML SYRINGE
INTRAMUSCULAR | Status: DC | PRN
  Administered 2021-02-17: 60 mg via INTRAVENOUS

## 2021-02-17 MED ORDER — CHLORHEXIDINE GLUCONATE 0.12 % MT SOLN
15.0000 mL | Freq: Once | OROMUCOSAL | Status: AC
Start: 2021-02-17 — End: 2021-02-17
  Administered 2021-02-17: 15 mL via OROMUCOSAL

## 2021-02-17 MED ORDER — ONDANSETRON HCL 4 MG/2ML IJ SOLN
INTRAMUSCULAR | Status: DC | PRN
  Administered 2021-02-17: 4 mg via INTRAVENOUS

## 2021-02-17 MED ORDER — FENTANYL CITRATE (PF) 100 MCG/2ML IJ SOLN
INTRAMUSCULAR | Status: DC | PRN
  Administered 2021-02-17: 25 ug via INTRAVENOUS
  Administered 2021-02-17 (×2): 50 ug via INTRAVENOUS
  Administered 2021-02-17: 25 ug via INTRAVENOUS

## 2021-02-17 MED ORDER — ORAL CARE MOUTH RINSE: 15.0000 mL | Freq: Once | OROMUCOSAL | Status: AC

## 2021-02-17 MED ORDER — HEPARIN SOD (PORK) LOCK FLUSH 100 UNIT/ML IV SOLN
INTRAVENOUS | Status: AC
  Filled 2021-02-17: qty 5

## 2021-02-17 MED ORDER — FENTANYL CITRATE (PF) 100 MCG/2ML IJ SOLN
25.0000 ug | INTRAMUSCULAR | Status: DC | PRN
  Administered 2021-02-17 (×2): 50 ug via INTRAVENOUS

## 2021-02-17 MED ORDER — DEXAMETHASONE SODIUM PHOSPHATE 10 MG/ML IJ SOLN
INTRAMUSCULAR | Status: DC | PRN
  Administered 2021-02-17: 10 mg via INTRAVENOUS

## 2021-02-17 MED ORDER — PROPOFOL 10 MG/ML IV BOLUS
INTRAVENOUS | Status: DC | PRN
  Administered 2021-02-17: 180 mg via INTRAVENOUS

## 2021-02-17 MED ORDER — LACTATED RINGERS IV SOLN: INTRAVENOUS | Status: DC

## 2021-02-17 MED ORDER — 0.9 % SODIUM CHLORIDE (POUR BTL) OPTIME
TOPICAL | Status: DC | PRN
  Administered 2021-02-17: 1000 mL

## 2021-02-17 MED ORDER — FENTANYL CITRATE (PF) 100 MCG/2ML IJ SOLN
INTRAMUSCULAR | Status: AC
  Filled 2021-02-17: qty 6

## 2021-02-17 MED ORDER — SODIUM CHLORIDE 0.9 % IV SOLN
Freq: Once | INTRAVENOUS | Status: AC
  Administered 2021-02-17: 500 mL
  Filled 2021-02-17: qty 1.2

## 2021-02-17 MED ORDER — KETOROLAC TROMETHAMINE 30 MG/ML IJ SOLN
INTRAMUSCULAR | Status: AC
  Filled 2021-02-17: qty 1

## 2021-02-17 MED ORDER — MIDAZOLAM HCL 2 MG/2ML IJ SOLN
INTRAMUSCULAR | Status: DC | PRN
  Administered 2021-02-17: 2 mg via INTRAVENOUS

## 2021-02-17 MED ORDER — MIDAZOLAM HCL 2 MG/2ML IJ SOLN
INTRAMUSCULAR | Status: AC
  Filled 2021-02-17: qty 6

## 2021-02-17 SURGICAL SUPPLY — 39 items
ADH SKNCLS APL OCTYL .7 VIOL (GAUZE/BANDAGES/DRESSINGS) ×1
BAG COUNTER SPONGE SURGICOUNT (BAG) IMPLANT
BAG DECANTER FOR FLEXI CONT (MISCELLANEOUS) ×2 IMPLANT
BENZOIN TINCTURE PRP APPL 2/3 (GAUZE/BANDAGES/DRESSINGS) IMPLANT
BLADE SURG 15 STRL LF DISP TIS (BLADE) ×1 IMPLANT
BLADE SURG 15 STRL SS (BLADE) ×2
BLADE SURG SZ11 CARB STEEL (BLADE) ×2 IMPLANT
CHLORAPREP W/TINT 26 (MISCELLANEOUS) ×2 IMPLANT
COVER SURGICAL LIGHT HANDLE (MISCELLANEOUS) ×2 IMPLANT
DECANTER SPIKE VIAL GLASS SM (MISCELLANEOUS) ×2 IMPLANT
DERMABOND ADVANCED (GAUZE/BANDAGES/DRESSINGS) ×1
DERMABOND ADVANCED .7 DNX12 (GAUZE/BANDAGES/DRESSINGS) ×1 IMPLANT
DRAPE C-ARM 42X120 X-RAY (DRAPES) ×2 IMPLANT
DRAPE LAPAROSCOPIC ABDOMINAL (DRAPES) ×2 IMPLANT
ELECT REM PT RETURN 15FT ADLT (MISCELLANEOUS) ×2 IMPLANT
GAUZE 4X4 16PLY ~~LOC~~+RFID DBL (SPONGE) ×2 IMPLANT
GAUZE SPONGE 4X4 12PLY STRL (GAUZE/BANDAGES/DRESSINGS) IMPLANT
GLOVE SURG POLYISO LF SZ6 (GLOVE) ×2 IMPLANT
GLOVE SURG POLYISO LF SZ7 (GLOVE) ×2 IMPLANT
GLOVE SURG UNDER POLY LF SZ7 (GLOVE) ×2 IMPLANT
GOWN STRL REUS W/TWL LRG LVL3 (GOWN DISPOSABLE) ×2 IMPLANT
GOWN STRL REUS W/TWL XL LVL3 (GOWN DISPOSABLE) ×2 IMPLANT
KIT BASIN OR (CUSTOM PROCEDURE TRAY) ×2 IMPLANT
KIT PORT POWER 8FR ISP CVUE (Port) ×2 IMPLANT
KIT TURNOVER KIT A (KITS) ×2 IMPLANT
NEEDLE HYPO 22GX1.5 SAFETY (NEEDLE) ×2 IMPLANT
PACK BASIC VI WITH GOWN DISP (CUSTOM PROCEDURE TRAY) ×2 IMPLANT
PENCIL SMOKE EVACUATOR (MISCELLANEOUS) IMPLANT
SUT MNCRL AB 4-0 PS2 18 (SUTURE) ×2 IMPLANT
SUT PROLENE 2 0 SH DA (SUTURE) ×4 IMPLANT
SUT VIC AB 2-0 SH 18 (SUTURE) IMPLANT
SUT VIC AB 2-0 SH 27 (SUTURE)
SUT VIC AB 2-0 SH 27X BRD (SUTURE) IMPLANT
SUT VIC AB 3-0 SH 27 (SUTURE) ×2
SUT VIC AB 3-0 SH 27X BRD (SUTURE) ×1 IMPLANT
SYR 10ML LL (SYRINGE) ×2 IMPLANT
SYR 20ML LL LF (SYRINGE) ×2 IMPLANT
TOWEL OR 17X26 10 PK STRL BLUE (TOWEL DISPOSABLE) ×2 IMPLANT
TOWEL OR NON WOVEN STRL DISP B (DISPOSABLE) ×2 IMPLANT

## 2021-02-17 NOTE — Anesthesia Procedure Notes (Signed)
Procedure Name: LMA Insertion Date/Time: 02/17/2021 7:28 AM Performed by: [place] Demeco Ducksworth C, CRNA Pre-anesthesia Checklist: Patient identified, Emergency Drugs available, Suction available and Patient being monitored Patient Re-evaluated:Patient Re-evaluated prior to induction Oxygen Delivery Method: Circle system utilized Preoxygenation: Pre-oxygenation with 100% oxygen Induction Type: IV induction Ventilation: Mask ventilation without difficulty LMA: LMA inserted LMA Size: 4.0 Number of attempts: 1 Airway Equipment and Method: Bite block Placement Confirmation: positive ETCO2 Tube secured with: Tape Dental Injury: Teeth and Oropharynx as per pre-operative assessment

## 2021-02-17 NOTE — Op Note (Signed)
Date: 02/17/21  Patient: Robin [name] MRN: [id number]  Preoperative Diagnosis: Pancreatic adenocarcinoma Postoperative Diagnosis: Same  Procedure: Portacath insertion  Surgeon: Michaelle Birks, MD  EBL: Minimal  Anesthesia: General LMA  Specimens: None  Indications: Ms. Colquhoun is a 55 yo female who presented with obstructive jaundice and was found to have a distal common bile duct stricture with a mass in the head of the pancreas. EUS with FNA confirmed a uT2N0 pancreatic ductal adenocarcinoma. She is to receive neoadjuvant chemotherapy and presents for port placement.  Findings: 8-Fr single-lumen power port placed via the right subclavian vein under fluoroscopic guidance.  Procedure details: Informed consent was obtained in the preoperative area prior to the procedure. The patient was brought to the operating room and placed on the table in the supine position. General anesthesia was induced and appropriate lines and drains were placed for intraoperative monitoring. Perioperative antibiotics were administered per SCIP guidelines. The neck and chest were prepped and draped in the usual sterile fashion. A pre-procedure timeout was taken verifying patient identity, surgical site and procedure to be performed.  The patient was placed in the Trendelenberg position and the right subclavian vein was accessed with a large-bore needle. A guidewire was inserted and advanced, and position in the SVC was confirmed fluoroscopically. The needle was removed and the wire was clipped to the drapes to secure its position. A small skin incision was made, incorporating the wire into the incision, and a subcutaneous pocket was created with cautery. The port and catheter were then flushed and brought onto the field. Three 3-0 prolene sutures were used to secure the port in the subcutaneous pocket, but the sutures were not tied down. The port was placed in the pocket. The catheter was then measured using fluoro - it  was placed over the skin adjacent to the guidewire, and marked externally at the cavoatrial junction. The catheter was then cut at this location, which was at 15cm. The dilator and sheath were then advanced over the guidewire under fluoroscopic guidance, and the wire and dilator were removed. The end of the catheter was inserted through the sheath and advanced, and the sheath was peeled away. The port was then accessed with a Huber needle, and blood was aspirated and the port was flushed with heparinized saline. A final fluoroscopic image confirmed appropriate position of the catheter tip within the SVC, without kinking of the catheter. The prolene sutures were tied down. A final flush of 500 units heparin (100 units/mL) was given via the port. The skin was closed with a deep layer of interrupted 3-0 Vicryl suture, followed by a running subcuticular 4-0 monocryl suture. Dermabond was applied.  The patient tolerated the procedure with no apparent complications. All counts were correct x2 at the end of the procedure. The patient was extubated and taken to PACU in stable condition.  Michaelle Birks, MD 02/17/21 8:21 AM

## 2021-02-17 NOTE — Interval H&P Note (Signed)
History and Physical Interval Note:  02/17/2021 7:07 AM  Robin [name]  has presented today for surgery, with the diagnosis of PANCREATIC CANCER.  The various methods of treatment have been discussed with the patient and family. After consideration of risks, benefits and other options for treatment, the patient has consented to  Procedure(s) with comments: INSERTION PORT-A-CATH (N/A) - LMA ANESTHESIA as a surgical intervention.  The patient's history has been reviewed, patient examined, no change in status, stable for surgery.  I have reviewed the patient's chart and labs.  Questions were answered to the patient's satisfaction.  Patient has had an EUS with FNA confirming a T2 pancreatic adenocarcinoma. Neoadjuvant chemotherapy planned. Proceed to OR for port placement, risks and benefits discussed, informed consent obtained.   Dwan Bolt

## 2021-02-17 NOTE — Discharge Instructions (Signed)
SURGERY DISCHARGE INSTRUCTIONS: PORT-A-CATH PLACEMENT  Activity You may resume your usual activities as tolerated Ok to shower in 24 hours, but do not bathe or submerge incision underwater. Do not drive while taking narcotic pain medication.  Wound Care Your incision is covered with skin glue called Dermabond. This will peel off on its own over time. You may shower and allow warm soapy water to run over your incisions. Gently pat dry. Do not submerge your incision underwater. Monitor your incision for any new redness, tenderness, or drainage. You may start using your port in 48 hours. Do not apply EMLA cream directly over the Dermabond (skin glue).  When to Call Us: Fever greater than 100.5 New redness, drainage, or swelling at incision site Severe pain, nausea, or vomiting Shortness of breath, difficulty breathing  For questions or concerns, please call the [facility] office at [phone].  

## 2021-02-17 NOTE — Anesthesia Postprocedure Evaluation (Signed)
Anesthesia Post Note  Patient: Robin [name]  Procedure(s) Performed: INSERTION PORT-A-CATH (Right: Chest)     Patient location during evaluation: PACU Anesthesia Type: General Level of consciousness: sedated and patient cooperative Pain management: pain level controlled Vital Signs Assessment: post-procedure vital signs reviewed and stable Respiratory status: spontaneous breathing Cardiovascular status: stable Anesthetic complications: no   No notable events documented.  Last Vitals:  Vitals:   02/17/21 0915 02/17/21 0926  BP: (!) 148/82 (!) 149/86  Pulse: 60 66  Resp: 15 16  Temp: 36.9 C   SpO2: 97% 98%    Last Pain:  Vitals:   02/17/21 0926  TempSrc:   PainSc: [place]

## 2021-02-17 NOTE — Transfer of Care (Signed)
Immediate Anesthesia Transfer of Care Note  Patient: Robin [name]  Procedure(s) Performed: INSERTION PORT-A-CATH (Chest)  Patient Location: PACU  Anesthesia Type:General  Level of Consciousness: awake, alert  and oriented  Airway & Oxygen Therapy: Patient Spontanous Breathing and Patient connected to face mask oxygen  Post-op Assessment: Report given to RN and Post -op Vital signs reviewed and stable  Post vital signs: Reviewed and stable  Last Vitals:  Vitals Value Taken Time  BP 154/94 02/17/21 0822  Temp    Pulse 89 02/17/21 0824  Resp 16 02/17/21 0824  SpO2 100 % 02/17/21 0824  Vitals shown include unvalidated device data.  Last Pain:  Vitals:   02/17/21 0613  TempSrc:   PainSc: 2       Patients Stated Pain Goal: 1 (20/72/18 2883)  Complications: No notable events documented.

## 2021-02-19 ENCOUNTER — Encounter (HOSPITAL_COMMUNITY): Payer: Self-pay | Admitting: Surgery

## 2021-02-21 ENCOUNTER — Other Ambulatory Visit (HOSPITAL_COMMUNITY): Payer: 59

## 2021-02-21 ENCOUNTER — Encounter (HOSPITAL_COMMUNITY): Payer: 59

## 2021-02-22 ENCOUNTER — Other Ambulatory Visit: Payer: Self-pay

## 2021-02-22 ENCOUNTER — Inpatient Hospital Stay: Payer: 59

## 2021-02-22 NOTE — Progress Notes (Signed)
The proposed treatment discussed in conference is for discussion purpose only and is not a binding recommendation.  The patients have not been physically examined, or presented with their treatment options.  Therefore, final treatment plans cannot be decided.  

## 2021-02-22 NOTE — Progress Notes (Signed)
Pharmacist Chemotherapy Monitoring - Initial Assessment    Anticipated start date: 03/02/21   The following has been reviewed per standard work regarding the patient's treatment regimen: The patient's diagnosis, treatment plan and drug doses, and organ/hematologic function Lab orders and baseline tests specific to treatment regimen  The treatment plan start date, drug sequencing, and pre-medications Prior authorization status  Patient's documented medication list, including drug-drug interaction screen and prescriptions for anti-emetics and supportive care specific to the treatment regimen The drug concentrations, fluid compatibility, administration routes, and timing of the medications to be used The patient's access for treatment and lifetime cumulative dose history, if applicable  The patient's medication allergies and previous infusion related reactions, if applicable   Changes made to treatment plan:  treatment plan date  Follow up needed:  Pending authorization for treatment    Larene Beach, [place], 02/22/2021  4:39 PM

## 2021-02-28 ENCOUNTER — Other Ambulatory Visit: Payer: Self-pay

## 2021-02-28 ENCOUNTER — Inpatient Hospital Stay: Payer: 59 | Attending: Physician Assistant

## 2021-02-28 ENCOUNTER — Ambulatory Visit (HOSPITAL_COMMUNITY)
Admission: RE | Admit: 2021-02-28 | Discharge: 2021-02-28 | Disposition: A | Discharge status: Home / Self Care | Payer: 59 | Source: Ambulatory Visit | Attending: Physician Assistant | Admitting: Physician Assistant

## 2021-02-28 DIAGNOSIS — Z803 Family history of malignant neoplasm of breast: Secondary | ICD-10-CM | POA: Diagnosis not present

## 2021-02-28 DIAGNOSIS — R1030 Lower abdominal pain, unspecified: Secondary | ICD-10-CM | POA: Insufficient documentation

## 2021-02-28 DIAGNOSIS — Z5111 Encounter for antineoplastic chemotherapy: Secondary | ICD-10-CM | POA: Diagnosis present

## 2021-02-28 DIAGNOSIS — Z79899 Other long term (current) drug therapy: Secondary | ICD-10-CM | POA: Insufficient documentation

## 2021-02-28 DIAGNOSIS — C250 Malignant neoplasm of head of pancreas: Secondary | ICD-10-CM | POA: Diagnosis present

## 2021-02-28 DIAGNOSIS — Z90710 Acquired absence of both cervix and uterus: Secondary | ICD-10-CM | POA: Diagnosis not present

## 2021-02-28 LAB — CMP (CANCER CENTER ONLY)
ALT: 22 U/L (ref 0–44)
AST: 18 U/L (ref 15–41)
Albumin: 3.6 g/dL (ref 3.5–5.0)
Alkaline Phosphatase: 67 U/L (ref 38–126)
Anion gap: 9 (ref 5–15)
BUN: 11 mg/dL (ref 6–20)
CO2: 26 mmol/L (ref 22–32)
Calcium: 9.7 mg/dL (ref 8.9–10.3)
Chloride: 108 mmol/L (ref 98–111)
Creatinine: 0.89 mg/dL (ref 0.44–1.00)
GFR, Estimated: >60 mL/min (ref >60)
Glucose, Bld: 88 mg/dL (ref 70–99)
Potassium: 3.8 mmol/L (ref 3.5–5.1)
Sodium: 143 mmol/L (ref 135–145)
Total Bilirubin: 0.9 mg/dL (ref 0.3–1.2)
Total Protein: 7.2 g/dL (ref 6.5–8.1)

## 2021-02-28 LAB — CBC WITH DIFFERENTIAL (CANCER CENTER ONLY)
Abs Immature Granulocytes: 0.02 10*3/uL (ref 0.00–0.07)
Basophils Absolute: 0.1 10*3/uL (ref 0.0–0.1)
Basophils Relative: 1 %
Eosinophils Absolute: 0.1 10*3/uL (ref 0.0–0.5)
Eosinophils Relative: 1 %
HCT: 38.6 % (ref 36.0–46.0)
Hemoglobin: 12.5 g/dL (ref 12.0–15.0)
Immature Granulocytes: 0 %
Lymphocytes Relative: 35 %
Lymphs Abs: 3.2 10*3/uL (ref 0.7–4.0)
MCH: 27.8 pg (ref 26.0–34.0)
MCHC: 32.4 g/dL (ref 30.0–36.0)
MCV: 85.8 fL (ref 80.0–100.0)
Monocytes Absolute: 0.6 10*3/uL (ref 0.1–1.0)
Monocytes Relative: 7 %
Neutro Abs: 5.3 10*3/uL (ref 1.7–7.7)
Neutrophils Relative %: 56 %
Platelet Count: 249 10*3/uL (ref 150–400)
RBC: 4.5 MIL/uL (ref 3.87–5.11)
RDW: 13.4 % (ref 11.5–15.5)
WBC Count: 9.3 10*3/uL (ref 4.0–10.5)
nRBC: 0 % (ref 0.0–0.2)

## 2021-02-28 LAB — GLUCOSE, CAPILLARY: Glucose-Capillary: 95 mg/dL (ref 70–99)

## 2021-02-28 MED ORDER — FLUDEOXYGLUCOSE F - 18 (FDG) INJECTION
10.4300 | Freq: Once | INTRAVENOUS | Status: AC | PRN
  Administered 2021-02-28: 10.43 via INTRAVENOUS

## 2021-03-02 ENCOUNTER — Other Ambulatory Visit: Payer: Self-pay

## 2021-03-02 ENCOUNTER — Inpatient Hospital Stay (HOSPITAL_BASED_OUTPATIENT_CLINIC_OR_DEPARTMENT_OTHER): Payer: 59 | Admitting: Hematology

## 2021-03-02 ENCOUNTER — Inpatient Hospital Stay: Payer: 59 | Admitting: Nutrition

## 2021-03-02 ENCOUNTER — Inpatient Hospital Stay: Payer: 59

## 2021-03-02 ENCOUNTER — Encounter: Payer: Self-pay | Admitting: Hematology

## 2021-03-02 VITALS — BP 132/63 | HR 79 | Temp 98.4°F | Resp 17 | Wt 207.0 lb

## 2021-03-02 DIAGNOSIS — C250 Malignant neoplasm of head of pancreas: Secondary | ICD-10-CM

## 2021-03-02 DIAGNOSIS — Z5111 Encounter for antineoplastic chemotherapy: Secondary | ICD-10-CM | POA: Diagnosis not present

## 2021-03-02 MED ORDER — OXALIPLATIN CHEMO INJECTION 100 MG/20ML
85.0000 mg/m2 | Freq: Once | INTRAVENOUS | Status: AC
  Administered 2021-03-02: 180 mg via INTRAVENOUS
  Filled 2021-03-02: qty 36

## 2021-03-02 MED ORDER — ATROPINE SULFATE 1 MG/ML IJ SOLN
0.5000 mg | Freq: Once | INTRAMUSCULAR | Status: AC | PRN
  Administered 2021-03-02: 0.5 mg via INTRAVENOUS

## 2021-03-02 MED ORDER — HEPARIN SOD (PORK) LOCK FLUSH 100 UNIT/ML IV SOLN
500.0000 [IU] | Freq: Once | INTRAVENOUS | Status: DC | PRN
  Filled 2021-03-02: qty 5

## 2021-03-02 MED ORDER — SODIUM CHLORIDE 0.9 % IV SOLN
400.0000 mg/m2 | Freq: Once | INTRAVENOUS | Status: AC
  Administered 2021-03-02: 856 mg via INTRAVENOUS
  Filled 2021-03-02: qty 42.8

## 2021-03-02 MED ORDER — PALONOSETRON HCL INJECTION 0.25 MG/5ML
INTRAVENOUS | Status: AC
  Filled 2021-03-02: qty 5

## 2021-03-02 MED ORDER — SODIUM CHLORIDE 0.9% FLUSH
10.0000 mL | INTRAVENOUS | Status: DC | PRN
  Filled 2021-03-02: qty 10

## 2021-03-02 MED ORDER — SODIUM CHLORIDE 0.9 % IV SOLN
150.0000 mg | Freq: Once | INTRAVENOUS | Status: AC
  Administered 2021-03-02: 150 mg via INTRAVENOUS
  Filled 2021-03-02: qty 150

## 2021-03-02 MED ORDER — SODIUM CHLORIDE 0.9 % IV SOLN
10.0000 mg | Freq: Once | INTRAVENOUS | Status: AC
  Administered 2021-03-02: 10 mg via INTRAVENOUS
  Filled 2021-03-02: qty 10

## 2021-03-02 MED ORDER — ATROPINE SULFATE 1 MG/ML IJ SOLN: 0.5000 mg | Freq: Once | INTRAMUSCULAR | Status: DC | PRN

## 2021-03-02 MED ORDER — DEXTROSE 5 % IV SOLN
Freq: Once | INTRAVENOUS | Status: AC
  Filled 2021-03-02: qty 250

## 2021-03-02 MED ORDER — SODIUM CHLORIDE 0.9 % IV SOLN
150.0000 mg/m2 | Freq: Once | INTRAVENOUS | Status: AC
  Administered 2021-03-02: 320 mg via INTRAVENOUS
  Filled 2021-03-02: qty 15

## 2021-03-02 MED ORDER — PALONOSETRON HCL INJECTION 0.25 MG/5ML
0.2500 mg | Freq: Once | INTRAVENOUS | Status: AC
  Administered 2021-03-02: 0.25 mg via INTRAVENOUS

## 2021-03-02 MED ORDER — ATROPINE SULFATE 1 MG/ML IJ SOLN
INTRAMUSCULAR | Status: AC
  Filled 2021-03-02: qty 1

## 2021-03-02 MED ORDER — SODIUM CHLORIDE 0.9 % IV SOLN
2325.0000 mg/m2 | INTRAVENOUS | Status: DC
  Administered 2021-03-02: 5000 mg via INTRAVENOUS
  Filled 2021-03-02: qty 100

## 2021-03-02 NOTE — Patient Instructions (Signed)
[name]  Discharge Instructions: Thank you for choosing [facility] to provide your oncology and hematology care.   If you have a lab appointment with the [facility], please go directly to the [facility] and check in at the registration area.   Wear comfortable clothing and clothing appropriate for easy access to any Portacath or PICC line.   We strive to give you quality time with your provider. You may need to reschedule your appointment if you arrive late (15 or more minutes).  Arriving late affects you and other patients whose appointments are after yours.  Also, if you miss three or more appointments without notifying the office, you may be dismissed from the clinic at the provider's discretion.      For prescription refill requests, have your pharmacy contact our office and allow 72 hours for refills to be completed.    Today you received the following chemotherapy and/or immunotherapy agents : Oxaliplatin, Irinotecan, Leucovorin, 5FU     To help prevent nausea and vomiting after your treatment, we encourage you to take your nausea medication as directed.  BELOW ARE SYMPTOMS THAT SHOULD BE REPORTED IMMEDIATELY: *FEVER GREATER THAN 100.4 F (38 C) OR HIGHER *CHILLS OR SWEATING *NAUSEA AND VOMITING THAT IS NOT CONTROLLED WITH YOUR NAUSEA MEDICATION *UNUSUAL SHORTNESS OF BREATH *UNUSUAL BRUISING OR BLEEDING *URINARY PROBLEMS (pain or burning when urinating, or frequent urination) *BOWEL PROBLEMS (unusual diarrhea, constipation, pain near the anus) TENDERNESS IN MOUTH AND THROAT WITH OR WITHOUT PRESENCE OF ULCERS (sore throat, sores in mouth, or a toothache) UNUSUAL RASH, SWELLING OR PAIN  UNUSUAL VAGINAL DISCHARGE OR ITCHING   Items with * indicate a potential emergency and should be followed up as soon as possible or go to the Emergency Department if any problems should occur.  Please show the CHEMOTHERAPY ALERT CARD or  IMMUNOTHERAPY ALERT CARD at check-in to the Emergency Department and triage nurse.  Should you have questions after your visit or need to cancel or reschedule your appointment, please contact [facility]  Dept: [phone]  and follow the prompts.  Office hours are 8:00 a.m. to 4:30 p.m. Monday - Friday. Please note that voicemails left after 4:00 p.m. may not be returned until the following business day.  We are closed weekends and major holidays. You have access to a nurse at all times for urgent questions. Please call the main number to the clinic Dept: [phone] and follow the prompts.   For any non-urgent questions, you may also contact your provider using MyChart. We now offer e-Visits for anyone 27 and older to request care online for non-urgent symptoms. For details visit [email or web address].   Also download the MyChart app! Go to the app store, search "MyChart", open the app, select [facility], and log in with your MyChart username and password.  Due to Covid, a mask is required upon entering the hospital/clinic. If you do not have a mask, one will be given to you upon arrival. For doctor visits, patients may have 1 support person aged 8 or older with them. For treatment visits, patients cannot have anyone with them due to current Covid guidelines and our immunocompromised population.   Oxaliplatin Injection What is this medication? OXALIPLATIN (ox AL i PLA tin) is a chemotherapy drug. It targets fast dividing cells, like cancer cells, and causes these cells to die. This medicine is usedto treat cancers of the colon and rectum, and many other cancers. This  medicine may be used for other purposes; ask your health care provider orpharmacist if you have questions. COMMON BRAND NAME(S): Eloxatin What should I tell my care team before I take this medication? They need to know if you have any of these conditions: heart disease history of irregular  heartbeat liver disease low blood counts, like white cells, platelets, or red blood cells lung or breathing disease, like asthma take medicines that treat or prevent blood clots tingling of the fingers or toes, or other nerve disorder an unusual or allergic reaction to oxaliplatin, other chemotherapy, other medicines, foods, dyes, or preservatives pregnant or trying to get pregnant breast-feeding How should I use this medication? This drug is given as an infusion into a vein. It is administered in a hospitalor clinic by a specially trained health care professional. Talk to your pediatrician regarding the use of this medicine in children.Special care may be needed. Overdosage: If you think you have taken too much of this medicine contact apoison control center or emergency room at once. NOTE: This medicine is only for you. Do not share this medicine with others. What if I miss a dose? It is important not to miss a dose. Call your doctor or health careprofessional if you are unable to keep an appointment. What may interact with this medication? Do not take this medicine with any of the following medications: cisapride dronedarone pimozide thioridazine This medicine may also interact with the following medications: aspirin and aspirin-like medicines certain medicines that treat or prevent blood clots like warfarin, apixaban, dabigatran, and rivaroxaban cisplatin cyclosporine diuretics medicines for infection like acyclovir, adefovir, amphotericin B, bacitracin, cidofovir, foscarnet, ganciclovir, gentamicin, pentamidine, vancomycin NSAIDs, medicines for pain and inflammation, like ibuprofen or naproxen other medicines that prolong the QT interval (an abnormal heart rhythm) pamidronate zoledronic acid This list may not describe all possible interactions. Give your health care provider a list of all the medicines, herbs, non-prescription drugs, or dietary supplements you use. Also tell  them if you smoke, drink alcohol, or use illegaldrugs. Some items may interact with your medicine. What should I watch for while using this medication? Your condition will be monitored carefully while you are receiving thismedicine. You may need blood work done while you are taking this medicine. This medicine may make you feel generally unwell. This is not uncommon as chemotherapy can affect healthy cells as well as cancer cells. Report any side effects. Continue your course of treatment even though you feel ill unless yourhealthcare professional tells you to stop. This medicine can make you more sensitive to cold. Do not drink cold drinks or use ice. Cover exposed skin before coming in contact with cold temperatures or cold objects. When out in cold weather wear warm clothing and cover your mouth and nose to warm the air that goes into your lungs. Tell your doctor if you getsensitive to the cold. Do not become pregnant while taking this medicine or for 9 months after stopping it. Women should inform their health care professional if they wish to become pregnant or think they might be pregnant. Men should not father a child while taking this medicine and for 6 months after stopping it. There is potential for serious side effects to an unborn child. Talk to your health careprofessional for more information. Do not breast-feed a child while taking this medicine or for 3 months afterstopping it. This medicine has caused ovarian failure in some women. This medicine may make it more difficult to get pregnant. Talk to your  health care professional if Ventura Sellers concerned about your fertility. This medicine has caused decreased sperm counts in some men. This may make it more difficult to father a child. Talk to your health care professional if Ventura Sellers concerned about your fertility. This medicine may increase your risk of getting an infection. Call your health care professional for advice if you get a fever, chills,  or sore throat, or other symptoms of a cold or flu. Do not treat yourself. Try to avoid beingaround people who are sick. Avoid taking medicines that contain aspirin, acetaminophen, ibuprofen, naproxen, or ketoprofen unless instructed by your health care professional.These medicines may hide a fever. Be careful brushing or flossing your teeth or using a toothpick because you may get an infection or bleed more easily. If you have any dental work done, Primary school teacher you are receiving this medicine. What side effects may I notice from receiving this medication? Side effects that you should report to your doctor or health care professionalas soon as possible: allergic reactions like skin rash, itching or hives, swelling of the face, lips, or tongue breathing problems cough low blood counts - this medicine may decrease the number of white blood cells, red blood cells, and platelets. You may be at increased risk for infections and bleeding nausea, vomiting pain, redness, or irritation at site where injected pain, tingling, numbness in the hands or feet signs and symptoms of bleeding such as bloody or black, tarry stools; red or dark brown urine; spitting up blood or brown material that looks like coffee grounds; red spots on the skin; unusual bruising or bleeding from the eyes, gums, or nose signs and symptoms of a dangerous change in heartbeat or heart rhythm like chest pain; dizziness; fast, irregular heartbeat; palpitations; feeling faint or lightheaded; falls signs and symptoms of infection like fever; chills; cough; sore throat; pain or trouble passing urine signs and symptoms of liver injury like dark yellow or brown urine; general ill feeling or flu-like symptoms; light-colored stools; loss of appetite; nausea; right upper belly pain; unusually weak or tired; yellowing of the eyes or skin signs and symptoms of low red blood cells or anemia such as unusually weak or tired; feeling faint or  lightheaded; falls signs and symptoms of muscle injury like dark urine; trouble passing urine or change in the amount of urine; unusually weak or tired; muscle pain; back pain Side effects that usually do not require medical attention (report to yourdoctor or health care professional if they continue or are bothersome): changes in taste diarrhea gas hair loss loss of appetite mouth sores This list may not describe all possible side effects. Call your doctor for medical advice about side effects. You may report side effects to FDA at1-800-FDA-1088. Where should I keep my medication? This drug is given in a hospital or clinic and will not be stored at home. NOTE: This sheet is a summary. It may not cover all possible information. If you have questions about this medicine, talk to your doctor, pharmacist, orhealth care provider.  2022 Elsevier/Gold Standard (2018-12-31 12:20:35)  Irinotecan injection What is this medication? IRINOTECAN (ir in oh TEE kan ) is a chemotherapy drug. It is used to treatcolon and rectal cancer. This medicine may be used for other purposes; ask your health care provider orpharmacist if you have questions. COMMON BRAND NAME(S): Camptosar What should I tell my care team before I take this medication? They need to know if you have any of these conditions: dehydration diarrhea infection (especially a  virus infection such as chickenpox, cold sores, or herpes) liver disease low blood counts, like low white cell, platelet, or red cell counts low levels of calcium, magnesium, or potassium in the blood recent or ongoing radiation therapy an unusual or allergic reaction to irinotecan, other medicines, foods, dyes, or preservatives pregnant or trying to get pregnant breast-feeding How should I use this medication? This drug is given as an infusion into a vein. It is administered in a hospitalor clinic by a specially trained health care professional. Talk to your  pediatrician regarding the use of this medicine in children.Special care may be needed. Overdosage: If you think you have taken too much of this medicine contact apoison control center or emergency room at once. NOTE: This medicine is only for you. Do not share this medicine with others. What if I miss a dose? It is important not to miss your dose. Call your doctor or health careprofessional if you are unable to keep an appointment. What may interact with this medication? Do not take this medicine with any of the following medications: cobicistat itraconazole This medicine may interact with the following medications: antiviral medicines for HIV or AIDS certain antibiotics like rifampin or rifabutin certain medicines for fungal infections like ketoconazole, posaconazole, and voriconazole certain medicines for seizures like carbamazepine, phenobarbital, phenotoin clarithromycin gemfibrozil nefazodone St. John's Wort This list may not describe all possible interactions. Give your health care provider a list of all the medicines, herbs, non-prescription drugs, or dietary supplements you use. Also tell them if you smoke, drink alcohol, or use illegaldrugs. Some items may interact with your medicine. What should I watch for while using this medication? Your condition will be monitored carefully while you are receiving this medicine. You will need important blood work done while you are taking thismedicine. This drug may make you feel generally unwell. This is not uncommon, as chemotherapy can affect healthy cells as well as cancer cells. Report any side effects. Continue your course of treatment even though you feel ill unless yourdoctor tells you to stop. In some cases, you may be given additional medicines to help with side effects.Follow all directions for their use. You may get drowsy or dizzy. Do not drive, use machinery, or do anything that needs mental alertness until you know how this  medicine affects you. Do not stand or sit up quickly, especially if you are an older patient. This reducesthe risk of dizzy or fainting spells. Call your health care professional for advice if you get a fever, chills, or sore throat, or other symptoms of a cold or flu. Do not treat yourself. This medicine decreases your body's ability to fight infections. Try to avoid beingaround people who are sick. Avoid taking products that contain aspirin, acetaminophen, ibuprofen, naproxen, or ketoprofen unless instructed by your doctor. These medicines may hide afever. This medicine may increase your risk to bruise or bleed. Call your doctor orhealth care professional if you notice any unusual bleeding. Be careful brushing and flossing your teeth or using a toothpick because you may get an infection or bleed more easily. If you have any dental work done,tell your dentist you are receiving this medicine. Do not become pregnant while taking this medicine or for 6 months after stopping it. Women should inform their health care professional if they wish to become pregnant or think they might be pregnant. Men should not father a child while taking this medicine and for 3 months after stopping it. There is potential for serious side  effects to an unborn child. Talk to your health careprofessional for more information. Do not breast-feed an infant while taking this medicine or for 7 days afterstopping it. This medicine has caused ovarian failure in some women. This medicine may make it more difficult to get pregnant. Talk to your health care professional if Ventura Sellers concerned about your fertility. This medicine has caused decreased sperm counts in some men. This may make it more difficult to father a child. Talk to your health care professional if Ventura Sellers concerned about your fertility. What side effects may I notice from receiving this medication? Side effects that you should report to your doctor or health care  professionalas soon as possible: allergic reactions like skin rash, itching or hives, swelling of the face, lips, or tongue chest pain diarrhea flushing, runny nose, sweating during infusion low blood counts - this medicine may decrease the number of white blood cells, red blood cells and platelets. You may be at increased risk for infections and bleeding. nausea, vomiting pain, swelling, warmth in the leg signs of decreased platelets or bleeding - bruising, pinpoint red spots on the skin, black, tarry stools, blood in the urine signs of infection - fever or chills, cough, sore throat, pain or difficulty passing urine signs of decreased red blood cells - unusually weak or tired, fainting spells, lightheadedness Side effects that usually do not require medical attention (report to yourdoctor or health care professional if they continue or are bothersome): constipation hair loss headache loss of appetite mouth sores stomach pain This list may not describe all possible side effects. Call your doctor for medical advice about side effects. You may report side effects to FDA at1-800-FDA-1088. Where should I keep my medication? This drug is given in a hospital or clinic and will not be stored at home. NOTE: This sheet is a summary. It may not cover all possible information. If you have questions about this medicine, talk to your doctor, pharmacist, orhealth care provider.  2022 Elsevier/Gold Standard (2019-07-14 17:46:13)  Leucovorin injection What is this medication? LEUCOVORIN (loo koe VOR in) is used to prevent or treat the harmful effects of some medicines. This medicine is used to treat anemia caused by a low amount of folic acid in the body. It is also used with 5-fluorouracil (5-FU) to treatcolon cancer. This medicine may be used for other purposes; ask your health care provider orpharmacist if you have questions. What should I tell my care team before I take this medication? They need  to know if you have any of these conditions: anemia from low levels of vitamin B-12 in the blood an unusual or allergic reaction to leucovorin, folic acid, other medicines, foods, dyes, or preservatives pregnant or trying to get pregnant breast-feeding How should I use this medication? This medicine is for injection into a muscle or into a vein. It is given by ahealth care professional in a hospital or clinic setting. Talk to your pediatrician regarding the use of this medicine in children.Special care may be needed. Overdosage: If you think you have taken too much of this medicine contact apoison control center or emergency room at once. NOTE: This medicine is only for you. Do not share this medicine with others. What if I miss a dose? This does not apply. What may interact with this medication? capecitabine fluorouracil phenobarbital phenytoin primidone trimethoprim-sulfamethoxazole This list may not describe all possible interactions. Give your health care provider a list of all the medicines, herbs, non-prescription drugs, or dietary supplements you  use. Also tell them if you smoke, drink alcohol, or use illegaldrugs. Some items may interact with your medicine. What should I watch for while using this medication? Your condition will be monitored carefully while you are receiving thismedicine. This medicine may increase the side effects of 5-fluorouracil, 5-FU. Tell your doctor or health care professional if you have diarrhea or mouth sores that donot get better or that get worse. What side effects may I notice from receiving this medication? Side effects that you should report to your doctor or health care professionalas soon as possible: allergic reactions like skin rash, itching or hives, swelling of the face, lips, or tongue breathing problems fever, infection mouth sores unusual bleeding or bruising unusually weak or tired Side effects that usually do not require medical  attention (report to yourdoctor or health care professional if they continue or are bothersome): constipation or diarrhea loss of appetite nausea, vomiting This list may not describe all possible side effects. Call your doctor for medical advice about side effects. You may report side effects to FDA at1-800-FDA-1088. Where should I keep my medication? This drug is given in a hospital or clinic and will not be stored at home. NOTE: This sheet is a summary. It may not cover all possible information. If you have questions about this medicine, talk to your doctor, pharmacist, orhealth care provider.  2022 Elsevier/Gold Standard (2008-02-17 16:50:29)  Fluorouracil, 5-FU injection What is this medication? FLUOROURACIL, 5-FU (flure oh YOOR a sil) is a chemotherapy drug. It slows the growth of cancer cells. This medicine is used to treat many types of cancer like breast cancer, colon or rectal cancer, pancreatic cancer, and stomachcancer. This medicine may be used for other purposes; ask your health care provider orpharmacist if you have questions. COMMON BRAND NAME(S): Adrucil What should I tell my care team before I take this medication? They need to know if you have any of these conditions: blood disorders dihydropyrimidine dehydrogenase (DPD) deficiency infection (especially a virus infection such as chickenpox, cold sores, or herpes) kidney disease liver disease malnourished, poor nutrition recent or ongoing radiation therapy an unusual or allergic reaction to fluorouracil, other chemotherapy, other medicines, foods, dyes, or preservatives pregnant or trying to get pregnant breast-feeding How should I use this medication? This drug is given as an infusion or injection into a vein. It is administeredin a hospital or clinic by a specially trained health care professional. Talk to your pediatrician regarding the use of this medicine in children.Special care may be needed. Overdosage: If you  think you have taken too much of this medicine contact apoison control center or emergency room at once. NOTE: This medicine is only for you. Do not share this medicine with others. What if I miss a dose? It is important not to miss your dose. Call your doctor or health careprofessional if you are unable to keep an appointment. What may interact with this medication? Do not take this medicine with any of the following medications: live virus vaccines This medicine may also interact with the following medications: medicines that treat or prevent blood clots like warfarin, enoxaparin, and dalteparin This list may not describe all possible interactions. Give your health care provider a list of all the medicines, herbs, non-prescription drugs, or dietary supplements you use. Also tell them if you smoke, drink alcohol, or use illegaldrugs. Some items may interact with your medicine. What should I watch for while using this medication? Visit your doctor for checks on your progress. This drug  may make you feel generally unwell. This is not uncommon, as chemotherapy can affect healthy cells as well as cancer cells. Report any side effects. Continue your course oftreatment even though you feel ill unless your doctor tells you to stop. In some cases, you may be given additional medicines to help with side effects.Follow all directions for their use. Call your doctor or health care professional for advice if you get a fever, chills or sore throat, or other symptoms of a cold or flu. Do not treat yourself. This drug decreases your body's ability to fight infections. Try toavoid being around people who are sick. This medicine may increase your risk to bruise or bleed. Call your doctor orhealth care professional if you notice any unusual bleeding. Be careful brushing and flossing your teeth or using a toothpick because you may get an infection or bleed more easily. If you have any dental work done,tell your dentist  you are receiving this medicine. Avoid taking products that contain aspirin, acetaminophen, ibuprofen, naproxen, or ketoprofen unless instructed by your doctor. These medicines may hide afever. Do not become pregnant while taking this medicine. Women should inform their doctor if they wish to become pregnant or think they might be pregnant. There is a potential for serious side effects to an unborn child. Talk to your health care professional or pharmacist for more information. Do not breast-feed aninfant while taking this medicine. Men should inform their doctor if they wish to father a child. This medicinemay lower sperm counts. Do not treat diarrhea with over the counter products. Contact your doctor ifyou have diarrhea that lasts more than 2 days or if it is severe and watery. This medicine can make you more sensitive to the sun. Keep out of the sun. If you cannot avoid being in the sun, wear protective clothing and use sunscreen.Do not use sun lamps or tanning beds/booths. What side effects may I notice from receiving this medication? Side effects that you should report to your doctor or health care professionalas soon as possible: allergic reactions like skin rash, itching or hives, swelling of the face, lips, or tongue low blood counts - this medicine may decrease the number of white blood cells, red blood cells and platelets. You may be at increased risk for infections and bleeding. signs of infection - fever or chills, cough, sore throat, pain or difficulty passing urine signs of decreased platelets or bleeding - bruising, pinpoint red spots on the skin, black, tarry stools, blood in the urine signs of decreased red blood cells - unusually weak or tired, fainting spells, lightheadedness breathing problems changes in vision chest pain mouth sores nausea and vomiting pain, swelling, redness at site where injected pain, tingling, numbness in the hands or feet redness, swelling, or sores on  hands or feet stomach pain unusual bleeding Side effects that usually do not require medical attention (report to yourdoctor or health care professional if they continue or are bothersome): changes in finger or toe nails diarrhea dry or itchy skin hair loss headache loss of appetite sensitivity of eyes to the light stomach upset unusually teary eyes This list may not describe all possible side effects. Call your doctor for medical advice about side effects. You may report side effects to FDA at1-800-FDA-1088. Where should I keep my medication? This drug is given in a hospital or clinic and will not be stored at home. NOTE: This sheet is a summary. It may not cover all possible information. If you have questions about this  medicine, talk to your doctor, pharmacist, orhealth care provider.  2022 Elsevier/Gold Standard (2019-07-14 15:00:03)

## 2021-03-02 NOTE — Progress Notes (Signed)
Patient was identified to be at risk for malnutrition's on the MST secondary to poor appetite and weight loss.  55 year old female diagnosed with pancreas cancer receiving FOLFIRINOX and followed by [name].  Past medical history includes bipolar disorder, asthma, IBS.  Medications include Bentyl, Colace, multivitamin, Zofran, Compazine.  Labs were reviewed.  Height: 69.5 inches. Weight: 207 pounds on July 7. Usual body weight: 225 pounds. BMI: 30.13.  Patient states appetite is improving and weight is starting to improve.  She has been drinking 1 oral nutrition supplement daily without difficulty.  She has some constipation secondary to pain medications however she was told this particular chemotherapy could cause diarrhea.  Nutrition diagnosis:  Food and nutrition related knowledge deficit related to pancreas cancer and associated treatments as evidenced by no prior need for nutrition related information.  Intervention: Educated to consume smaller more frequent meals and snacks with adequate calories and protein for weight stability. Encouraged continued oral nutrition supplements and provided suggestions based on oral intake.  Gave patient coupons. Brief education provided on food strategies for diarrhea versus constipation. Nutrition facts sheets given.  Contact information provided.  Monitoring, evaluation, goals: Patient will tolerate adequate calories and protein to minimize weight loss.  Next visit: No follow-up scheduled.  Patient has contact information for questions or concerns.  **Disclaimer: This note was dictated with voice recognition software. Similar sounding words can inadvertently be transcribed and this note may contain transcription errors which may not have been corrected upon publication of note.**

## 2021-03-02 NOTE — Progress Notes (Signed)
Robin [name]   [phone] [phone]   Clinic Follow up Note   Patient Care Team: [name], MD as PCP - General (Family Medicine) Jerline Pain, MD as PCP - Cardiology (Cardiology) End, Harrell Gave, MD as Consulting Physician (Cardiology) Alda Berthold, DO as Consulting Physician (Neurology) Heilingoetter, Tobe Sos, PA-C as Physician Assistant (Physician Assistant) Truitt Merle, MD as Consulting Physician (Oncology) Dwan Bolt, MD as Consulting Physician (General Surgery)  Date of Service:  03/02/2021  CHIEF COMPLAINT: f/u of pancreatic cancer  SUMMARY OF ONCOLOGIC HISTORY: Oncology History  Pancreatic cancer ([place])  01/11/2021 Imaging   CT Abdomen/Pelvis  IMPRESSION: Multiple low-attenuation liver lesions, which cannot be characterized on this unenhanced exam. Abdomen MRI without and with contrast is recommended for further characterization.   Diffuse biliary ductal dilatation, with soft tissue prominence in the region of the pancreatic head. Pancreatic mass cannot be excluded on this unenhanced exam. Recommend abdomen MRI and MRCP without and with contrast for further evaluation.   Colonic diverticulosis, without radiographic evidence of diverticulitis.     01/12/2021 Imaging   MRI ABDOMEN WITHOUT AND WITH CONTRAST (INCLUDING MRCP)  IMPRESSION: Biliary duct distension of both intra and extrahepatic biliary tree with abrupt caliber change but with no visible mass on imaging. Caliber change more abrupt than expected for stricture and the biliary tree was top-normal size at 6 mm in 2017. Endoscopic assessment is suggested to exclude an occult biliary lesion or atypical stricture.   Lobulated T2 bright lesion in the medial segment of the LEFT hepatic lobe that favors a sclerosed hemangioma. There is some internal septation suggested and the filling pattern is slightly atypical. Consider 3 to six-month follow-up with  extended delays following contrast to 7 minutes better assess filling characteristics, differential consideration would include a biliary cystadenoma with septal enhancement.   01/13/2021 Procedure   ERCP under the care of Dr. Carlean Purl  IMPRESSION: 1. Dilation of the common bile duct and main pancreatic duct. 2. Sphincterotomy and placement of a plastic biliary stent.    01/13/2021 Pathology Results   CASE: [id number]   A. COMMON BILE DUCT, STRICTURE, #1, BRUSHING:   FINAL MICROSCOPIC DIAGNOSIS:  - Suspicious for malignancy  - See comment    02/08/2021 Imaging   CT CHEST WITHOUT CONTRAST AND CT OF ABDOMEN AND PELVIS WITH CONTRAST  IMPRESSION: 1. Findings are highly suspicious for a hypovascular mass in the posterior aspect of the head of the pancreas, as detailed above. This appears associated with persistent narrowing of the distal common bile duct and abrupt termination of the pancreatic duct. Common bile duct stent appears appropriately located, but is associated with some persistent dilatation of the common bile duct. 2. 1.2 x 0.7 cm ground-glass attenuation nodule in the right upper lobe. Initial follow-up with CT at 6-12 months is recommended to confirm persistence. If persistent, repeat CT is recommended every 2 years until 5 years of stability has been established. This recommendation follows the consensus statement: Guidelines for Management of Incidental Pulmonary Nodules Detected on CT Images: From the Fleischner Society 2017; Radiology 2017; 284:228-243. 3. Multiple liver lesions, smallest of which likely represent tiny simple cysts. The larger lesion in the left lobe of the liver has imaging characteristics once again most suggestive of a cavernous hemangioma. 4. Aortic atherosclerosis, in addition to left anterior descending coronary artery disease. Please note that although the presence of coronary artery calcium documents the presence of coronary  artery disease, the severity of this disease  and any potential stenosis cannot be assessed on this non-gated CT examination. Assessment for potential risk factor modification, dietary therapy or pharmacologic therapy may be warranted, if clinically indicated. 5. Additional incidental findings, as above.    02/09/2021 Procedure   ERCP under the care of Dr. Rush Landmark   IMPRESSION: Limited images during ERCP demonstrates removal plastic biliary stent and placement of a new metallic biliary stent. Please refer to the dictated operative report for full details of intraoperative findings and procedure.   02/09/2021 Pathology Results   CASE: [id number]  FINAL MICROSCOPIC DIAGNOSIS:  A. PANCREAS, HEAD OF LESION, FINE NEEDLE ASPIRATION:  - Malignant cells consistent with adenocarcinoma    02/09/2021 Procedure   EGD Impression: - No gross lesions in esophagus. Z-line regular, 40 cm from the incisors. - Severe gastritis noted. No other gross lesions in the stomach. Biopsied. - Plastic biliary stent in the duodenum. Removed. Revealed a patent biliary sphincterotomy was found. - No gross lesions in the duodenal bulb, in the first portion of the duodenum and in the second portion of the duodenum. EUS Impression: - Hyperechoic material consistent with sludge was visualized endosonographically in the gallbladder. - There was a suggestion of a stricture in the lower third of the main bile duct leading to dilation in the middle third of the main bile duct and in the upper third of the main bile duct with evidence of hyperechoic material consistent with sludge within the common bile duct. - A mass-like region was identified in the pancreatic head where the CBD narrowed and where the upstream PD dilation was noted. This is not the most confluent of masses however. Cytology results are pending. However, the endosonographic appearance is suggestive of potential pancreatic adenocarcinoma. This was  staged T2 N0 Mx by endosonographic criteria. The staging applies if malignancy is confirmed. Fine needle biopsy performed. - No malignant-appearing lymph nodes were visualized in the celiac region (level 20), peripancreatic region and porta hepatis region.   02/09/2021 Cancer Staging   Staging form: Exocrine Pancreas, AJCC 8th Edition - Clinical stage from 02/09/2021: Stage IB (cT2, cN0, cM0) - Signed by Truitt Merle, MD on 02/14/2021  Total positive nodes: 0    02/13/2021 Initial Diagnosis   Pancreatic cancer ([place])   02/28/2021 Imaging   PET  Signs of pancreatic head mass with suspected hepatic metastatic lesion in the RIGHT hepatic lobe as described.   Equivocal uptake in adjacent lymph nodes, potentially reactive in the setting of biliary stent. Note the biliary stent placement is in the mid to distal common bile duct but appears similar to recent ERCP images.   Stable appearance of ground-glass in the RIGHT upper lobe. This does not display increased FDG uptake and would be atypical for metastatic lesion based on appearance and location. Suggest six-month follow-up for further evaluation.   RIGHT thyroid uptake is slightly asymmetric potentially associated with small nodule. Recommend thyroid ultrasound with biopsy of focal abnormality is seen.(Ref: J Am Coll Radiol. 2015 Feb;12(2): 143-50).   03/02/2021 -  Chemotherapy    Patient is on Treatment Plan: PANCREAS MODIFIED FOLFIRINOX Q14D X 4 CYCLES          CURRENT THERAPY:  FOLFIRINOX q2weeks, starting 03/02/21  INTERVAL HISTORY:  Robin [name] is here for a follow up of pancreatic cancer. She was last seen by me with PA Cassie in consultation on 02/13/21. She was seen in the infusion room today. She wanted me to discuss the PET scan results with her. She continues to have  abdominal pain, controlled with oxycodone.   All other systems were reviewed with the patient and are negative.  MEDICAL HISTORY:  Past Medical History:   Diagnosis Date   Asthma    Bipolar disorder ([place])    Diverticulosis    Gestational hypertension    HA (headache)    History of borderline personality disorder    Hypersomnia, persistent 04/29/2013   IBS (irritable bowel syndrome)    Memory loss, short term    [name] 04-03-13    Migraine    Pancreatic cancer ([place])    Reflux esophagitis    Seasonal allergies     SURGICAL HISTORY: Past Surgical History:  Procedure Laterality Date   ABDOMINAL HYSTERECTOMY  2000   BILIARY BRUSHING  01/13/2021   Procedure: BILIARY BRUSHING;  Surgeon: Gatha Mayer, MD;  Location: [facility];  Service: Endoscopy;;   BILIARY DILATION  02/09/2021   Procedure: BILIARY DILATION;  Surgeon: Irving Copas., MD;  Location: [facility];  Service: Gastroenterology;;   BILIARY STENT PLACEMENT N/A 01/13/2021   Procedure: BILIARY STENT PLACEMENT;  Surgeon: Gatha Mayer, MD;  Location: [facility];  Service: Endoscopy;  Laterality: N/A;   BILIARY STENT PLACEMENT  02/09/2021   Procedure: BILIARY STENT PLACEMENT;  Surgeon: Irving Copas., MD;  Location: [place];  Service: Gastroenterology;;   BIOPSY  01/13/2021   Procedure: BIOPSY;  Surgeon: Gatha Mayer, MD;  Location: [facility];  Service: Endoscopy;;   BIOPSY  02/09/2021   Procedure: BIOPSY;  Surgeon: Irving Copas., MD;  [facility];  Service: Gastroenterology;;   COLONOSCOPY  01/2020   ERCP N/A 01/13/2021   Procedure: ENDOSCOPIC RETROGRADE CHOLANGIOPANCREATOGRAPHY (ERCP);  Surgeon: Gatha Mayer, MD;  Location: [facility];  Service: Endoscopy;  Laterality: N/A;   ERCP N/A 02/09/2021   Procedure: ENDOSCOPIC RETROGRADE CHOLANGIOPANCREATOGRAPHY (ERCP);  Surgeon: Irving Copas., MD;  Location: [employer] [facility];  Service: Gastroenterology;  Laterality: N/A;   ESOPHAGOGASTRODUODENOSCOPY (EGD) WITH PROPOFOL N/A 02/09/2021   Procedure: ESOPHAGOGASTRODUODENOSCOPY (EGD) WITH PROPOFOL;  Surgeon:  Rush Landmark Telford Nab., MD;  Location: [place];  Service: Gastroenterology;  Laterality: N/A;   EUS N/A 02/09/2021   Procedure: UPPER ENDOSCOPIC ULTRASOUND (EUS) LINEAR;  Surgeon: Irving Copas., MD;  Location: [place];  Service: Gastroenterology;  Laterality: N/A;   FINE NEEDLE ASPIRATION  02/09/2021   Procedure: FINE NEEDLE ASPIRATION (FNA) LINEAR;  Surgeon: Irving Copas., MD;  Location: [place];  Service: Gastroenterology;;   KNEE SURGERY Right 96,98,00,02,05,2014   NASAL SINUS SURGERY     NOSE SURGERY     [id number]   PORTACATH PLACEMENT Right 02/17/2021   Procedure: INSERTION PORT-A-CATH;  Surgeon: Dwan Bolt, MD;  Location: [facility];  Service: General;  Laterality: Right;  LMA ANESTHESIA   REPLACEMENT TOTAL KNEE Right    SPHINCTEROTOMY  01/13/2021   Procedure: SPHINCTEROTOMY;  Surgeon: Gatha Mayer, MD;  Location: [facility];  Service: Endoscopy;;   STENT REMOVAL  02/09/2021   Procedure: Lavell Islam REMOVAL;  Surgeon: Irving Copas., MD;  Location: [place];  Service: Gastroenterology;;   WRIST SURGERY Right [phone]    I have reviewed the social history and family history with the patient and they are unchanged from previous note.  ALLERGIES:  is allergic to aspirin, bee venom, penicillins, bactrim [sulfamethoxazole-trimethoprim], and sulfa antibiotics.  MEDICATIONS:  Current Outpatient Medications  Medication Sig Dispense Refill   azelastine (ASTELIN) 0.1 % nasal spray Place 2 sprays into both nostrils 2 (two) times daily. 30 mL  12   clobetasol cream (TEMOVATE) 2.72 % Apply 1 application topically 2 (two) times daily. 30 g 0   cyclobenzaprine (FLEXERIL) 5 MG tablet TAKE 1 TABLET(5 MG) BY MOUTH AT BEDTIME AS NEEDED FOR LOWER BACK PAIN OR SEVERE HEADACHE 30 tablet 0   dicyclomine (BENTYL) 10 MG capsule Take 1 capsule (10 mg total) by mouth in the morning and at bedtime. 60 capsule 0   docusate sodium (COLACE) 100 MG capsule Take  100 mg by mouth 2 (two) times daily.     EPINEPHrine 0.3 mg/0.3 mL IJ SOAJ injection Inject 0.3 mg into the muscle as needed for anaphylaxis. INJECT 0.3 MLS (0.3 MG TOTAL) INTO THE MUSCLE ONCE 1 each 0   fluticasone (FLONASE) 50 MCG/ACT nasal spray Place 1 spray into both nostrils 2 (two) times daily.     gabapentin (NEURONTIN) 300 MG capsule Take 300 mg by mouth daily as needed (pain).     LATUDA 40 MG TABS tablet Take 1 tablet (40 mg total) by mouth daily with breakfast. 90 tablet 0   lidocaine-prilocaine (EMLA) cream Apply 1 application topically as needed. 30 g 2   lidocaine-prilocaine (EMLA) cream Apply to affected area once 30 g 3   montelukast (SINGULAIR) 10 MG tablet Take 10 mg by mouth daily.     Multiple Vitamin (MULTIVITAMIN WITH MINERALS) TABS tablet Take 1 tablet by mouth daily. 30 tablet 2   ondansetron (ZOFRAN) 8 MG tablet Take 1 tablet (8 mg total) by mouth every 8 (eight) hours as needed for nausea or vomiting. Starting on day 3 after chemotherapy 30 tablet 2   ondansetron (ZOFRAN) 8 MG tablet Take 1 tablet (8 mg total) by mouth 2 (two) times daily as needed. Start on day 3 after chemotherapy. 30 tablet 1   oxyCODONE (OXY IR/ROXICODONE) 5 MG immediate release tablet Take 1 tablet (5 mg total) by mouth every 4 (four) hours as needed for severe pain. 30 tablet 0   prochlorperazine (COMPAZINE) 10 MG tablet Take 1 tablet (10 mg total) by mouth every 6 (six) hours as needed. 30 tablet 2   prochlorperazine (COMPAZINE) 10 MG tablet Take 1 tablet (10 mg total) by mouth every 6 (six) hours as needed (Nausea or vomiting). 30 tablet 1   No current facility-administered medications for this visit.    PHYSICAL EXAMINATION: ECOG PERFORMANCE STATUS: 1 - Symptomatic but completely ambulatory  BP 132/63, HR 79, RR 17, T 36.9, SpO2 100% on RA Due to COVID19 we will limit examination to appearance. Patient had no complaints.  GENERAL:alert, no distress and comfortable SKIN: skin color normal,  no rashes or significant lesions EYES: normal, Conjunctiva are pink and non-injected, sclera clear  NEURO: alert & oriented x 3 with fluent speech  LABORATORY DATA:  I have reviewed the data as listed CBC Latest Ref Rng & Units 02/28/2021 02/10/2021 02/08/2021  WBC 4.0 - 10.5 K/uL 9.3 14.7(H) 16.7(H)  Hemoglobin 12.0 - 15.0 g/dL 12.5 12.4 12.5  Hematocrit 36.0 - 46.0 % 38.6 39.3 39.5  Platelets 150 - 400 K/uL 249 311 294     CMP Latest Ref Rng & Units 02/28/2021 02/10/2021 02/09/2021  Glucose 70 - 99 mg/dL 88 100(H) 102(H)  BUN 6 - 20 mg/dL 11 8 8   Creatinine 0.44 - 1.00 mg/dL 0.89 1.00 1.01(H)  Sodium 135 - 145 mmol/L 143 138 139  Potassium 3.5 - 5.1 mmol/L 3.8 3.8 3.9  Chloride 98 - 111 mmol/L 108 104 107  CO2 22 - 32 mmol/L 26  24 24  Calcium 8.9 - 10.3 mg/dL 9.7 9.4 9.2  Total Protein 6.5 - 8.1 g/dL 7.2 6.9 6.5  Total Bilirubin 0.3 - 1.2 mg/dL 0.9 1.4(H) 4.4(H)  Alkaline Phos 38 - 126 U/L 67 157(H) 160(H)  AST 15 - 41 U/L 18 91(H) 142(H)  ALT 0 - 44 U/L 22 174(H) 202(H)      RADIOGRAPHIC STUDIES: I have personally reviewed the radiological images as listed and agreed with the findings in the report. No results found.   ASSESSMENT & PLAN:  Robin [name] is a 55 y.o. female with   1. Adenocarcinoma of the pancreas diagnosed in June 2022, cT2N0Mx -The patient presented with fatigue, abdominal pain, and jaundice in May 2022. S/p biliary stent placement x2. -CT 02/08/21 showed: 2.5 cm hypovascular area in pancreatic head adjacent to the common bile duct in direct contact with anterior surface of inferior vena cava; 1.2 cm ground-glass attenuation nodule in RUL; multiple liver lesions, the smallest of which noted to likely represent tiny simple cysts; larger lesion in left lobe of the liver was noted to be most suggestive of a cavernous hemangioma. -Baseline CA 19.9 was 72 -The EUS showed a mass like region in the pancreatic head where the CBD narrowed and where the upstream PD  dilation was noted. The endosonographic appearance was suggestive of potential pancreatic adenocarcinoma (T2, N0, Mx). The final pathology showed ([id number]) malignant cells consistent with adenocarcinoma. -Since the tumor is abutting the anterior surface of the inferior vena cava, the recommendation is 4 months (8 treatments) of neoadjuvant chemotherapy with FOLFIRINOXIV every 2 weeks. The goal is to shrink the tumor so the patient may be a candidate for resection under the care of Dr. Zenia Resides. -Port placed 02/17/21, chemo education completed -I personally reviewed her PET 02/28/21 and discussed with her in detail. There is a hypermetabolic liver lesion in right lobe,SUV 5.1, suspicious for liver mets, no other definitive distant mets. I plan to review her images in tumor board next week, to see if it's feasible to biopsy the hypermetabolic liver lesion  -She is beginning C1 FOLFIRINOX today, 03/02/21.  Potential side effects discussed with her again, she knows what to watch.  We also discussed that this is the recommend regiment for metastatic pancreatic cancer.  -Virtual visit next week for toxicity checkup   2. Right upper quadrant/epigastric abdominal pain -Patient currently being treated with oxycodone 1-2 tablets per day and tylenol if needed. I refilled her oxycodone today (03/02/21)   3. Family history for cancer  -Maternal great-grandmother, grandmother, and 2 sisters with breast cancer -The patient has 1 biologic daughter -[name] recommend referral to genetic counseling -The patient is not interested in referral to genetic counseling at this time but will call us if she changed her mind   4. Social -The patient lives with her wife and has 1 adopted son and a step son -The patient works at [employer] ~5 hours per day -Discussed with the patient to let us know if she needs paperwork completed for any leave of absence for work      PLAN: -PET scan reviewed, will review in tumor  board next week  -I refilled her oxycodone today. -proceed with C1 FOLFIRINOX today -labs, f/u, and C2 in 2 weeks -phone visit next week    No problem-specific Assessment & Plan notes found for this encounter.   No orders of the defined types were placed in this encounter.  All questions were answered. The patient knows  to call the clinic with any problems, questions or concerns. No barriers to learning was detected. The total time spent in the appointment was 30 minutes.     Truitt Merle, MD 03/02/2021   I, Wilburn Mylar, am acting as scribe for Truitt Merle, MD.   I have reviewed the above documentation for accuracy and completeness, and I agree with the above.

## 2021-03-03 ENCOUNTER — Telehealth: Payer: Self-pay | Admitting: Hematology

## 2021-03-03 ENCOUNTER — Encounter: Payer: Self-pay | Admitting: Hematology

## 2021-03-03 ENCOUNTER — Other Ambulatory Visit: Payer: Self-pay | Admitting: Hematology

## 2021-03-03 MED ORDER — OXYCODONE HCL 5 MG PO TABS: 5.0000 mg | ORAL_TABLET | ORAL | 0 refills | Status: DC | PRN

## 2021-03-03 NOTE — Telephone Encounter (Signed)
Scheduled follow-up appointment per 7/7 los. Patient is aware. 

## 2021-03-03 NOTE — Telephone Encounter (Signed)
Left message with follow-up appointments per 7/7 los. 

## 2021-03-04 ENCOUNTER — Inpatient Hospital Stay: Payer: 59

## 2021-03-04 ENCOUNTER — Other Ambulatory Visit: Payer: Self-pay

## 2021-03-04 VITALS — BP 130/65 | HR 74 | Temp 98.5°F | Resp 17 | Ht 69.5 in

## 2021-03-04 DIAGNOSIS — Z5111 Encounter for antineoplastic chemotherapy: Secondary | ICD-10-CM | POA: Diagnosis not present

## 2021-03-04 DIAGNOSIS — C250 Malignant neoplasm of head of pancreas: Secondary | ICD-10-CM

## 2021-03-04 MED ORDER — SODIUM CHLORIDE 0.9% FLUSH
10.0000 mL | INTRAVENOUS | Status: DC | PRN
  Administered 2021-03-04: 10 mL
  Filled 2021-03-04: qty 10

## 2021-03-04 MED ORDER — HEPARIN SOD (PORK) LOCK FLUSH 100 UNIT/ML IV SOLN
500.0000 [IU] | Freq: Once | INTRAVENOUS | Status: AC | PRN
  Administered 2021-03-04: 500 [IU]
  Filled 2021-03-04: qty 5

## 2021-03-06 ENCOUNTER — Encounter: Payer: Self-pay | Admitting: *Deleted

## 2021-03-06 NOTE — Progress Notes (Signed)
[place] Work  Clinical Social Work received a phone call from patient requesting information on financial resources.  Patient stated she was working prior to diagnosis,and has been unable to work since starting treatment.  Patient is not receiving any benefits through her employer and is experiencing increased financial stress.  CSW and patient discussed resources at [facility]  CSW encouraged patient to meet with the financial advocate to apply for the [facility].  CSW sent patients request and information to [facility] financial advocate.  CSW team will also plan to see patient on 03/16/21 when she is at [facility] to enroll in the [facility].  Johnnye Lana, MSW, LCSW, OSW-C Clinical Social Worker [facility] [phone]

## 2021-03-08 ENCOUNTER — Other Ambulatory Visit: Payer: Self-pay

## 2021-03-08 NOTE — Progress Notes (Signed)
The proposed treatment discussed in conference is for discussion purpose only and is not a binding recommendation.  The patients have not been physically examined, or presented with their treatment options.  Therefore, final treatment plans cannot be decided.  

## 2021-03-09 ENCOUNTER — Inpatient Hospital Stay (HOSPITAL_BASED_OUTPATIENT_CLINIC_OR_DEPARTMENT_OTHER): Payer: 59 | Admitting: Hematology

## 2021-03-09 ENCOUNTER — Encounter: Payer: Self-pay | Admitting: Hematology

## 2021-03-09 DIAGNOSIS — C250 Malignant neoplasm of head of pancreas: Secondary | ICD-10-CM | POA: Diagnosis not present

## 2021-03-09 NOTE — Progress Notes (Signed)
[place]   [phone] [phone]   Clinic Follow up Note   Patient Care Team: [name], MD as PCP - General (Family Medicine) Jerline Pain, MD as PCP - Cardiology (Cardiology) End, Harrell Gave, MD as Consulting Physician (Cardiology) Alda Berthold, DO as Consulting Physician (Neurology) Heilingoetter, Tobe Sos, PA-C as Physician Assistant (Physician Assistant) Truitt Merle, MD as Consulting Physician (Oncology) Dwan Bolt, MD as Consulting Physician (General Surgery)  Date of Service:  03/09/2021  I connected with Robin [name] on 03/09/2021 at 12:50 PM EDT by telephone visit and verified that I am speaking with the correct person using two identifiers.  I discussed the limitations, risks, security and privacy concerns of performing an evaluation and management service by telephone and the availability of in person appointments. I also discussed with the patient that there may be a patient responsible charge related to this service. The patient expressed understanding and agreed to proceed.   Other persons participating in the visit and their role in the encounter:  patient's wife  Patient's location:  home Provider's location:  my office  CHIEF COMPLAINT: f/u of pancreatic cancer  SUMMARY OF ONCOLOGIC HISTORY: Oncology History  Pancreatic cancer ([place])  01/11/2021 Imaging   CT Abdomen/Pelvis  IMPRESSION: Multiple low-attenuation liver lesions, which cannot be characterized on this unenhanced exam. Abdomen MRI without and with contrast is recommended for further characterization.   Diffuse biliary ductal dilatation, with soft tissue prominence in the region of the pancreatic head. Pancreatic mass cannot be excluded on this unenhanced exam. Recommend abdomen MRI and MRCP without and with contrast for further evaluation.   Colonic diverticulosis, without radiographic evidence of diverticulitis.     01/12/2021 Imaging   MRI  ABDOMEN WITHOUT AND WITH CONTRAST (INCLUDING MRCP)  IMPRESSION: Biliary duct distension of both intra and extrahepatic biliary tree with abrupt caliber change but with no visible mass on imaging. Caliber change more abrupt than expected for stricture and the biliary tree was top-normal size at 6 mm in 2017. Endoscopic assessment is suggested to exclude an occult biliary lesion or atypical stricture.   Lobulated T2 bright lesion in the medial segment of the LEFT hepatic lobe that favors a sclerosed hemangioma. There is some internal septation suggested and the filling pattern is slightly atypical. Consider 3 to six-month follow-up with extended delays following contrast to 7 minutes better assess filling characteristics, differential consideration would include a biliary cystadenoma with septal enhancement.   01/13/2021 Procedure   ERCP under the care of Dr. Carlean Purl  IMPRESSION: 1. Dilation of the common bile duct and main pancreatic duct. 2. Sphincterotomy and placement of a plastic biliary stent.    01/13/2021 Pathology Results   CASE: [id number]   A. COMMON BILE DUCT, STRICTURE, #1, BRUSHING:   FINAL MICROSCOPIC DIAGNOSIS:  - Suspicious for malignancy  - See comment    02/08/2021 Imaging   CT CHEST WITHOUT CONTRAST AND CT OF ABDOMEN AND PELVIS WITH CONTRAST  IMPRESSION: 1. Findings are highly suspicious for a hypovascular mass in the posterior aspect of the head of the pancreas, as detailed above. This appears associated with persistent narrowing of the distal common bile duct and abrupt termination of the pancreatic duct. Common bile duct stent appears appropriately located, but is associated with some persistent dilatation of the common bile duct. 2. 1.2 x 0.7 cm ground-glass attenuation nodule in the right upper lobe. Initial follow-up with CT at 6-12 months is recommended to confirm persistence. If persistent, repeat  CT is recommended every 2 years until 5 years  of stability has been established. This recommendation follows the consensus statement: Guidelines for Management of Incidental Pulmonary Nodules Detected on CT Images: From the Fleischner Society 2017; Radiology 2017; 284:228-243. 3. Multiple liver lesions, smallest of which likely represent tiny simple cysts. The larger lesion in the left lobe of the liver has imaging characteristics once again most suggestive of a cavernous hemangioma. 4. Aortic atherosclerosis, in addition to left anterior descending coronary artery disease. Please note that although the presence of coronary artery calcium documents the presence of coronary artery disease, the severity of this disease and any potential stenosis cannot be assessed on this non-gated CT examination. Assessment for potential risk factor modification, dietary therapy or pharmacologic therapy may be warranted, if clinically indicated. 5. Additional incidental findings, as above.    02/09/2021 Procedure   ERCP under the care of Dr. Rush Landmark   IMPRESSION: Limited images during ERCP demonstrates removal plastic biliary stent and placement of a new metallic biliary stent. Please refer to the dictated operative report for full details of intraoperative findings and procedure.   02/09/2021 Pathology Results   CASE: [id number]  FINAL MICROSCOPIC DIAGNOSIS:  A. PANCREAS, HEAD OF LESION, FINE NEEDLE ASPIRATION:  - Malignant cells consistent with adenocarcinoma    02/09/2021 Procedure   EGD Impression: - No gross lesions in esophagus. Z-line regular, 40 cm from the incisors. - Severe gastritis noted. No other gross lesions in the stomach. Biopsied. - Plastic biliary stent in the duodenum. Removed. Revealed a patent biliary sphincterotomy was found. - No gross lesions in the duodenal bulb, in the first portion of the duodenum and in the second portion of the duodenum. EUS Impression: - Hyperechoic material consistent with sludge was  visualized endosonographically in the gallbladder. - There was a suggestion of a stricture in the lower third of the main bile duct leading to dilation in the middle third of the main bile duct and in the upper third of the main bile duct with evidence of hyperechoic material consistent with sludge within the common bile duct. - A mass-like region was identified in the pancreatic head where the CBD narrowed and where the upstream PD dilation was noted. This is not the most confluent of masses however. Cytology results are pending. However, the endosonographic appearance is suggestive of potential pancreatic adenocarcinoma. This was staged T2 N0 Mx by endosonographic criteria. The staging applies if malignancy is confirmed. Fine needle biopsy performed. - No malignant-appearing lymph nodes were visualized in the celiac region (level 20), peripancreatic region and porta hepatis region.   02/09/2021 Cancer Staging   Staging form: Exocrine Pancreas, AJCC 8th Edition - Clinical stage from 02/09/2021: Stage IB (cT2, cN0, cM0) - Signed by Truitt Merle, MD on 02/14/2021  Total positive nodes: 0    02/13/2021 Initial Diagnosis   Pancreatic cancer ([place])   02/28/2021 Imaging   PET  Signs of pancreatic head mass with suspected hepatic metastatic lesion in the RIGHT hepatic lobe as described.   Equivocal uptake in adjacent lymph nodes, potentially reactive in the setting of biliary stent. Note the biliary stent placement is in the mid to distal common bile duct but appears similar to recent ERCP images.   Stable appearance of ground-glass in the RIGHT upper lobe. This does not display increased FDG uptake and would be atypical for metastatic lesion based on appearance and location. Suggest six-month follow-up for further evaluation.   RIGHT thyroid uptake is slightly asymmetric potentially associated with  small nodule. Recommend thyroid ultrasound with biopsy of focal abnormality is seen.(Ref: J Am  Coll Radiol. 2015 Feb;12(2): 143-50).   03/02/2021 -  Chemotherapy    Patient is on Treatment Plan: PANCREAS MODIFIED FOLFIRINOX Q14D X 4 CYCLES          CURRENT THERAPY:  FOLFIRINOX q2weeks, starting 03/02/21  INTERVAL HISTORY:  Robin [name] was contacted for a follow up of pancreatic cancer. She was last seen by me on 03/02/21.  She reports diarrhea and fatigue from C1. She reports her fatigue has finally started to improve today. She notes she checks her weight once a week and found she lost two lbs this past Monday. She denies any neuropathy symptoms.   All other systems were reviewed with the patient and are negative.  MEDICAL HISTORY:  Past Medical History:  Diagnosis Date   Asthma    Bipolar disorder ([place])    Diverticulosis    Gestational hypertension    HA (headache)    History of borderline personality disorder    Hypersomnia, persistent 04/29/2013   IBS (irritable bowel syndrome)    Memory loss, short term    [name] 04-03-13    Migraine    Pancreatic cancer ([place])    Reflux esophagitis    Seasonal allergies     SURGICAL HISTORY: Past Surgical History:  Procedure Laterality Date   ABDOMINAL HYSTERECTOMY  2000   BILIARY BRUSHING  01/13/2021   Procedure: BILIARY BRUSHING;  Surgeon: Gatha Mayer, MD;  Location: [facility];  Service: Endoscopy;;   BILIARY DILATION  02/09/2021   Procedure: BILIARY DILATION;  Surgeon: Irving Copas., MD;  Location: [place];  Service: Gastroenterology;;   BILIARY STENT PLACEMENT N/A 01/13/2021   Procedure: BILIARY STENT PLACEMENT;  Surgeon: Gatha Mayer, MD;  Location: [facility];  Service: Endoscopy;  Laterality: N/A;   BILIARY STENT PLACEMENT  02/09/2021   Procedure: BILIARY STENT PLACEMENT;  Surgeon: Irving Copas., MD;  Location: [place];  Service: Gastroenterology;;   BIOPSY  01/13/2021   Procedure: BIOPSY;  Surgeon: Gatha Mayer, MD;  Location: [facility];  Service: Endoscopy;;    BIOPSY  02/09/2021   Procedure: BIOPSY;  Surgeon: Irving Copas., MD;  [facility];  Service: Gastroenterology;;   COLONOSCOPY  01/2020   ERCP N/A 01/13/2021   Procedure: ENDOSCOPIC RETROGRADE CHOLANGIOPANCREATOGRAPHY (ERCP);  Surgeon: Gatha Mayer, MD;  Location: [facility];  Service: Endoscopy;  Laterality: N/A;   ERCP N/A 02/09/2021   Procedure: ENDOSCOPIC RETROGRADE CHOLANGIOPANCREATOGRAPHY (ERCP);  Surgeon: Irving Copas., MD;  Location: [place];  Service: Gastroenterology;  Laterality: N/A;   ESOPHAGOGASTRODUODENOSCOPY (EGD) WITH PROPOFOL N/A 02/09/2021   Procedure: ESOPHAGOGASTRODUODENOSCOPY (EGD) WITH PROPOFOL;  Surgeon: Rush Landmark Telford Nab., MD;  Location: [place];  Service: Gastroenterology;  Laterality: N/A;   EUS N/A 02/09/2021   Procedure: UPPER ENDOSCOPIC ULTRASOUND (EUS) LINEAR;  Surgeon: Irving Copas., MD;  Location: [place];  Service: Gastroenterology;  Laterality: N/A;   FINE NEEDLE ASPIRATION  02/09/2021   Procedure: FINE NEEDLE ASPIRATION (FNA) LINEAR;  Surgeon: Irving Copas., MD;  Location: [place];  Service: Gastroenterology;;   KNEE SURGERY Right 96,98,00,02,05,2014   NASAL SINUS SURGERY     NOSE SURGERY     [phone]   PORTACATH PLACEMENT Right 02/17/2021   Procedure: INSERTION PORT-A-CATH;  Surgeon: Dwan Bolt, MD;  Location: [facility];  Service: General;  Laterality: Right;  LMA ANESTHESIA   REPLACEMENT TOTAL KNEE Right    SPHINCTEROTOMY  01/13/2021  Procedure: SPHINCTEROTOMY;  Surgeon: Gatha Mayer, MD;  Location: [facility];  Service: Endoscopy;;   STENT REMOVAL  02/09/2021   Procedure: STENT REMOVAL;  Surgeon: Irving Copas., MD;  Location: [place];  Service: Gastroenterology;;   WRIST SURGERY Right [phone]    I have reviewed the social history and family history with the patient and they are unchanged from previous note.  ALLERGIES:  is allergic to aspirin,  bee venom, penicillins, bactrim [sulfamethoxazole-trimethoprim], and sulfa antibiotics.  MEDICATIONS:  Current Outpatient Medications  Medication Sig Dispense Refill   azelastine (ASTELIN) 0.1 % nasal spray Place 2 sprays into both nostrils 2 (two) times daily. 30 mL 12   clobetasol cream (TEMOVATE) 3.53 % Apply 1 application topically 2 (two) times daily. 30 g 0   cyclobenzaprine (FLEXERIL) 5 MG tablet TAKE 1 TABLET(5 MG) BY MOUTH AT BEDTIME AS NEEDED FOR LOWER BACK PAIN OR SEVERE HEADACHE 30 tablet 0   dicyclomine (BENTYL) 10 MG capsule Take 1 capsule (10 mg total) by mouth in the morning and at bedtime. 60 capsule 0   docusate sodium (COLACE) 100 MG capsule Take 100 mg by mouth 2 (two) times daily.     EPINEPHrine 0.3 mg/0.3 mL IJ SOAJ injection Inject 0.3 mg into the muscle as needed for anaphylaxis. INJECT 0.3 MLS (0.3 MG TOTAL) INTO THE MUSCLE ONCE 1 each 0   fluticasone (FLONASE) 50 MCG/ACT nasal spray Place 1 spray into both nostrils 2 (two) times daily.     gabapentin (NEURONTIN) 300 MG capsule Take 300 mg by mouth daily as needed (pain).     LATUDA 40 MG TABS tablet Take 1 tablet (40 mg total) by mouth daily with breakfast. 90 tablet 0   lidocaine-prilocaine (EMLA) cream Apply 1 application topically as needed. 30 g 2   lidocaine-prilocaine (EMLA) cream Apply to affected area once 30 g 3   montelukast (SINGULAIR) 10 MG tablet Take 10 mg by mouth daily.     Multiple Vitamin (MULTIVITAMIN WITH MINERALS) TABS tablet Take 1 tablet by mouth daily. 30 tablet 2   ondansetron (ZOFRAN) 8 MG tablet Take 1 tablet (8 mg total) by mouth every 8 (eight) hours as needed for nausea or vomiting. Starting on day 3 after chemotherapy 30 tablet 2   ondansetron (ZOFRAN) 8 MG tablet Take 1 tablet (8 mg total) by mouth 2 (two) times daily as needed. Start on day 3 after chemotherapy. 30 tablet 1   oxyCODONE (OXY IR/ROXICODONE) 5 MG immediate release tablet Take 1 tablet (5 mg total) by mouth every 4 (four)  hours as needed for severe pain. 60 tablet 0   prochlorperazine (COMPAZINE) 10 MG tablet Take 1 tablet (10 mg total) by mouth every 6 (six) hours as needed. 30 tablet 2   prochlorperazine (COMPAZINE) 10 MG tablet Take 1 tablet (10 mg total) by mouth every 6 (six) hours as needed (Nausea or vomiting). 30 tablet 1   No current facility-administered medications for this visit.    PHYSICAL EXAMINATION: ECOG PERFORMANCE STATUS: 1 - Symptomatic but completely ambulatory  There were no vitals filed for this visit. There were no vitals filed for this visit.  No vitals taken today, Exam not performed today  LABORATORY DATA:  I have reviewed the data as listed CBC Latest Ref Rng & Units 02/28/2021 02/10/2021 02/08/2021  WBC 4.0 - 10.5 K/uL 9.3 14.7(H) 16.7(H)  Hemoglobin 12.0 - 15.0 g/dL 12.5 12.4 12.5  Hematocrit 36.0 - 46.0 % 38.6 39.3 39.5  Platelets 150 -  400 K/uL 249 311 294     CMP Latest Ref Rng & Units 02/28/2021 02/10/2021 02/09/2021  Glucose 70 - 99 mg/dL 88 100(H) 102(H)  BUN 6 - 20 mg/dL 11 8 8   Creatinine 0.44 - 1.00 mg/dL 0.89 1.00 1.01(H)  Sodium 135 - 145 mmol/L 143 138 139  Potassium 3.5 - 5.1 mmol/L 3.8 3.8 3.9  Chloride 98 - 111 mmol/L 108 104 107  CO2 22 - 32 mmol/L 26 24 24   Calcium 8.9 - 10.3 mg/dL 9.7 9.4 9.2  Total Protein 6.5 - 8.1 g/dL 7.2 6.9 6.5  Total Bilirubin 0.3 - 1.2 mg/dL 0.9 1.4(H) 4.4(H)  Alkaline Phos 38 - 126 U/L 67 157(H) 160(H)  AST 15 - 41 U/L 18 91(H) 142(H)  ALT 0 - 44 U/L 22 174(H) 202(H)      RADIOGRAPHIC STUDIES: I have personally reviewed the radiological images as listed and agreed with the findings in the report. No results found.   ASSESSMENT & PLAN:  Robin [name] is a 55 y.o. female with   1. Adenocarcinoma of the pancreas diagnosed in June 2022, cT2N0Mx -The patient presented with fatigue, abdominal pain, and jaundice in May 2022. S/p biliary stent placement x2. -CT 02/08/21 showed: 2.5 cm hypovascular area in pancreatic head  adjacent to the common bile duct in direct contact with anterior surface of inferior vena cava; 1.2 cm ground-glass attenuation nodule in RUL; multiple liver lesions, the smallest of which noted to likely represent tiny simple cysts; larger lesion in left lobe of the liver was noted to be most suggestive of a cavernous hemangioma. -Baseline CA 19.9 was 72 -The EUS showed a mass like region in the pancreatic head where the CBD narrowed and where the upstream PD dilation was noted. The endosonographic appearance was suggestive of potential pancreatic adenocarcinoma (T2, N0, Mx). The final pathology showed ([id number]) malignant cells consistent with adenocarcinoma. -Since the tumor is abutting the anterior surface of the inferior vena cava, the recommendation is 4 months (8 treatments) of neoadjuvant chemotherapy with FOLFIRINOXIV every 2 weeks. The goal is to shrink the tumor so the patient may be a candidate for resection under the care of Dr. Zenia Resides. -Port placed 02/17/21, chemo education completed -PET 02/28/21 showed a hypermetabolic liver lesion in right lobe, SUV 5.1, suspicious for liver mets, no other definitive distant mets.  -She began C1 FOLFIRINOX 03/02/21.   -Her case was discussed in our multidisciplinary GI tumor board conference yesterday, 03/08/21. The recommendation was to proceed with liver ultrasound and possible biopsy if the hypermetabolic liver lesion is visible by [place]. Pt is agreeable to proceed  -labs and f/u with C3 in 1 week.   2. Right upper quadrant/epigastric abdominal pain -Patient currently being treated with oxycodone 1-2 tablets per day and tylenol if needed.    3. Family history for cancer  -Maternal great-grandmother, grandmother, and 2 sisters with breast cancer -The patient has 1 biologic daughter -[name] recommend referral to genetic counseling -The patient is not interested in referral to genetic counseling at this time but will call us if she changes her mind    4. Social -The patient lives with her wife and has 1 adopted son and a step son -The patient works at [employer] ~5 hours per day -Discussed with the patient to let us know if she needs paperwork completed for any leave of absence for work      PLAN: -I will order liver ultrasound for next week -labs, f/u, and C2  in 1 week     No problem-specific Assessment & Plan notes found for this encounter.   Orders Placed This Encounter  Procedures   US Abdomen Complete    Standing Status:   Future    Standing Expiration Date:   03/09/2022    Scheduling Instructions:     Please evaluate the hypermetabolic liver lesion (adjacent to right kidney) with [place] to see if we can biopsy the lesion    Order Specific Question:   Reason for Exam (SYMPTOM  OR DIAGNOSIS REQUIRED)    Answer:   evaluate liver lesion    Order Specific Question:   Preferred imaging location?    Answer:   [facility]   All questions were answered. The patient knows to call the clinic with any problems, questions or concerns. No barriers to learning was detected. The total time spent in the appointment was 22 minutes.     Truitt Merle, MD 03/09/2021   I, Wilburn Mylar, am acting as scribe for Truitt Merle, MD.   I have reviewed the above documentation for accuracy and completeness, and I agree with the above.

## 2021-03-10 ENCOUNTER — Telehealth: Payer: Self-pay

## 2021-03-10 ENCOUNTER — Other Ambulatory Visit: Payer: Self-pay

## 2021-03-10 ENCOUNTER — Encounter: Payer: Self-pay | Admitting: Hematology

## 2021-03-10 DIAGNOSIS — R197 Diarrhea, unspecified: Secondary | ICD-10-CM

## 2021-03-10 MED ORDER — DIPHENOXYLATE-ATROPINE 2.5-0.025 MG PO TABS: ORAL_TABLET | ORAL | 0 refills | Status: DC

## 2021-03-10 NOTE — Telephone Encounter (Addendum)
This nurse received a call from this patient stating that she has had 4+ episodes od diarrhea this morning.  She has taken 4 immodium tablet with no relief.  This nurse spoke with [name] who gave an order for Lomotil.  Patient has been made aware of new order.  Patient has no further questions or concerns at this time and knows to call clinic with further questions or concerns.

## 2021-03-10 NOTE — Telephone Encounter (Signed)
Accessed in error

## 2021-03-13 ENCOUNTER — Other Ambulatory Visit: Payer: Self-pay

## 2021-03-13 ENCOUNTER — Ambulatory Visit (HOSPITAL_COMMUNITY)
Admission: RE | Admit: 2021-03-13 | Discharge: 2021-03-13 | Disposition: A | Discharge status: Home / Self Care | Payer: 59 | Source: Ambulatory Visit | Attending: Hematology | Admitting: Hematology

## 2021-03-13 DIAGNOSIS — C250 Malignant neoplasm of head of pancreas: Secondary | ICD-10-CM | POA: Diagnosis present

## 2021-03-15 ENCOUNTER — Encounter: Payer: Self-pay | Admitting: Hematology

## 2021-03-15 ENCOUNTER — Other Ambulatory Visit: Payer: Self-pay | Admitting: Hematology

## 2021-03-15 DIAGNOSIS — C250 Malignant neoplasm of head of pancreas: Secondary | ICD-10-CM

## 2021-03-15 NOTE — Progress Notes (Signed)
Called pt to introduce myself as her Arboriculturist.  Pt would like to apply for the [employer] so she will bring proof of income on 03/16/21.  If approved I will give her an expense sheet and my card for any questions or concerns she may have in the future.  I will also apply to the [place] for copay assistance.  I will notify the pt of the outcome once I receive it.

## 2021-03-16 ENCOUNTER — Encounter: Payer: Self-pay | Admitting: Hematology

## 2021-03-16 ENCOUNTER — Other Ambulatory Visit: Payer: Self-pay

## 2021-03-16 ENCOUNTER — Inpatient Hospital Stay: Payer: 59

## 2021-03-16 ENCOUNTER — Inpatient Hospital Stay (HOSPITAL_BASED_OUTPATIENT_CLINIC_OR_DEPARTMENT_OTHER): Payer: 59 | Admitting: Hematology

## 2021-03-16 ENCOUNTER — Inpatient Hospital Stay: Payer: 59 | Admitting: General Practice

## 2021-03-16 VITALS — BP 130/77 | HR 74 | Temp 98.4°F | Resp 17 | Wt 207.5 lb

## 2021-03-16 DIAGNOSIS — K1379 Other lesions of oral mucosa: Secondary | ICD-10-CM

## 2021-03-16 DIAGNOSIS — C250 Malignant neoplasm of head of pancreas: Secondary | ICD-10-CM

## 2021-03-16 DIAGNOSIS — Z5111 Encounter for antineoplastic chemotherapy: Secondary | ICD-10-CM | POA: Diagnosis not present

## 2021-03-16 DIAGNOSIS — Z95828 Presence of other vascular implants and grafts: Secondary | ICD-10-CM | POA: Insufficient documentation

## 2021-03-16 LAB — CMP (CANCER CENTER ONLY)
ALT: 34 U/L (ref 0–44)
AST: 27 U/L (ref 15–41)
Albumin: 3.8 g/dL (ref 3.5–5.0)
Alkaline Phosphatase: 60 U/L (ref 38–126)
Anion gap: 8 (ref 5–15)
BUN: 12 mg/dL (ref 6–20)
CO2: 26 mmol/L (ref 22–32)
Calcium: 9.1 mg/dL (ref 8.9–10.3)
Chloride: 107 mmol/L (ref 98–111)
Creatinine: 0.81 mg/dL (ref 0.44–1.00)
GFR, Estimated: >60 mL/min (ref >60)
Glucose, Bld: 88 mg/dL (ref 70–99)
Potassium: 3.7 mmol/L (ref 3.5–5.1)
Sodium: 141 mmol/L (ref 135–145)
Total Bilirubin: 0.6 mg/dL (ref 0.3–1.2)
Total Protein: 7 g/dL (ref 6.5–8.1)

## 2021-03-16 LAB — CBC WITH DIFFERENTIAL (CANCER CENTER ONLY)
Abs Immature Granulocytes: 0.01 10*3/uL (ref 0.00–0.07)
Basophils Absolute: 0.1 10*3/uL (ref 0.0–0.1)
Basophils Relative: 1 %
Eosinophils Absolute: 0.2 10*3/uL (ref 0.0–0.5)
Eosinophils Relative: 4 %
HCT: 36.5 % (ref 36.0–46.0)
Hemoglobin: 11.7 g/dL — ABNORMAL LOW (ref 12.0–15.0)
Immature Granulocytes: 0 %
Lymphocytes Relative: 39 %
Lymphs Abs: 2.5 10*3/uL (ref 0.7–4.0)
MCH: 27.4 pg (ref 26.0–34.0)
MCHC: 32.1 g/dL (ref 30.0–36.0)
MCV: 85.5 fL (ref 80.0–100.0)
Monocytes Absolute: 0.5 10*3/uL (ref 0.1–1.0)
Monocytes Relative: 8 %
Neutro Abs: 3.1 10*3/uL (ref 1.7–7.7)
Neutrophils Relative %: 48 %
Platelet Count: 227 10*3/uL (ref 150–400)
RBC: 4.27 MIL/uL (ref 3.87–5.11)
RDW: 13.3 % (ref 11.5–15.5)
WBC Count: 6.3 10*3/uL (ref 4.0–10.5)
nRBC: 0 % (ref 0.0–0.2)

## 2021-03-16 MED ORDER — ATROPINE SULFATE 1 MG/ML IJ SOLN
INTRAMUSCULAR | Status: AC
  Filled 2021-03-16: qty 1

## 2021-03-16 MED ORDER — ATROPINE SULFATE 1 MG/ML IJ SOLN
0.5000 mg | Freq: Once | INTRAMUSCULAR | Status: AC
  Administered 2021-03-16: 0.5 mg via INTRAVENOUS

## 2021-03-16 MED ORDER — MAGIC MOUTHWASH W/LIDOCAINE
10.0000 mL | Freq: Four times a day (QID) | ORAL | 2 refills | Status: DC | PRN
Start: 2021-03-16 — End: 2021-03-16

## 2021-03-16 MED ORDER — PALONOSETRON HCL INJECTION 0.25 MG/5ML
INTRAVENOUS | Status: AC
  Filled 2021-03-16: qty 5

## 2021-03-16 MED ORDER — SODIUM CHLORIDE 0.9 % IV SOLN
10.0000 mg | Freq: Once | INTRAVENOUS | Status: AC
  Administered 2021-03-16: 10 mg via INTRAVENOUS
  Filled 2021-03-16: qty 10

## 2021-03-16 MED ORDER — OXALIPLATIN CHEMO INJECTION 100 MG/20ML
85.0000 mg/m2 | Freq: Once | INTRAVENOUS | Status: AC
  Administered 2021-03-16: 180 mg via INTRAVENOUS
  Filled 2021-03-16: qty 36

## 2021-03-16 MED ORDER — SODIUM CHLORIDE 0.9 % IV SOLN
150.0000 mg/m2 | Freq: Once | INTRAVENOUS | Status: AC
  Administered 2021-03-16: 320 mg via INTRAVENOUS
  Filled 2021-03-16: qty 15

## 2021-03-16 MED ORDER — SODIUM CHLORIDE 0.9 % IV SOLN
400.0000 mg/m2 | Freq: Once | INTRAVENOUS | Status: AC
  Administered 2021-03-16: 856 mg via INTRAVENOUS
  Filled 2021-03-16: qty 42.8

## 2021-03-16 MED ORDER — PALONOSETRON HCL INJECTION 0.25 MG/5ML
0.2500 mg | Freq: Once | INTRAVENOUS | Status: AC
  Administered 2021-03-16: 0.25 mg via INTRAVENOUS

## 2021-03-16 MED ORDER — SODIUM CHLORIDE 0.9 % IV SOLN
2325.0000 mg/m2 | INTRAVENOUS | Status: DC
  Administered 2021-03-16: 5000 mg via INTRAVENOUS
  Filled 2021-03-16: qty 100

## 2021-03-16 MED ORDER — SODIUM CHLORIDE 0.9% FLUSH
10.0000 mL | Freq: Once | INTRAVENOUS | Status: AC
Start: 2021-03-16 — End: 2021-03-16
  Administered 2021-03-16: 10 mL
  Filled 2021-03-16: qty 10

## 2021-03-16 MED ORDER — DEXTROSE 5 % IV SOLN
Freq: Once | INTRAVENOUS | Status: AC
Start: 2021-03-16 — End: 2021-03-16
  Filled 2021-03-16: qty 250

## 2021-03-16 MED ORDER — SODIUM CHLORIDE 0.9 % IV SOLN
150.0000 mg | Freq: Once | INTRAVENOUS | Status: AC
  Administered 2021-03-16: 150 mg via INTRAVENOUS
  Filled 2021-03-16: qty 150

## 2021-03-16 MED ORDER — MAGIC MOUTHWASH W/LIDOCAINE: 10.0000 mL | Freq: Four times a day (QID) | ORAL | 2 refills | Status: DC | PRN

## 2021-03-16 NOTE — Progress Notes (Signed)
[place]   [phone] Fax:([phone]   Clinic Follow up Note   Patient Care Team: [name], MD as PCP - General (Family Medicine) Jerline Pain, MD as PCP - Cardiology (Cardiology) End, Harrell Gave, MD as Consulting Physician (Cardiology) Alda Berthold, DO as Consulting Physician (Neurology) Heilingoetter, Tobe Sos, PA-C as Physician Assistant (Physician Assistant) Truitt Merle, MD as Consulting Physician (Oncology) Dwan Bolt, MD as Consulting Physician (General Surgery)  Date of Service:  03/16/2021  CHIEF COMPLAINT: f/u of pancreatic cancer  SUMMARY OF ONCOLOGIC HISTORY: Oncology History Overview Note  Cancer Staging Pancreatic cancer [facility]) Staging form: Exocrine Pancreas, AJCC 8th Edition - Clinical stage from 02/09/2021: Stage IV (cT2, cN0, cM1) - Signed by Truitt Merle, MD on 03/16/2021 Total positive nodes: 0    Pancreatic cancer ([place])  01/11/2021 Imaging   CT Abdomen/Pelvis  IMPRESSION: Multiple low-attenuation liver lesions, which cannot be characterized on this unenhanced exam. Abdomen MRI without and with contrast is recommended for further characterization.   Diffuse biliary ductal dilatation, with soft tissue prominence in the region of the pancreatic head. Pancreatic mass cannot be excluded on this unenhanced exam. Recommend abdomen MRI and MRCP without and with contrast for further evaluation.   Colonic diverticulosis, without radiographic evidence of diverticulitis.     01/12/2021 Imaging   MRI ABDOMEN WITHOUT AND WITH CONTRAST (INCLUDING MRCP)  IMPRESSION: Biliary duct distension of both intra and extrahepatic biliary tree with abrupt caliber change but with no visible mass on imaging. Caliber change more abrupt than expected for stricture and the biliary tree was top-normal size at 6 mm in 2017. Endoscopic assessment is suggested to exclude an occult biliary lesion or atypical stricture.   Lobulated T2  bright lesion in the medial segment of the LEFT hepatic lobe that favors a sclerosed hemangioma. There is some internal septation suggested and the filling pattern is slightly atypical. Consider 3 to six-month follow-up with extended delays following contrast to 7 minutes better assess filling characteristics, differential consideration would include a biliary cystadenoma with septal enhancement.   01/13/2021 Procedure   ERCP under the care of Dr. Carlean Purl  IMPRESSION: 1. Dilation of the common bile duct and main pancreatic duct. 2. Sphincterotomy and placement of a plastic biliary stent.    01/13/2021 Pathology Results   CASE: [id number]   A. COMMON BILE DUCT, STRICTURE, #1, BRUSHING:   FINAL MICROSCOPIC DIAGNOSIS:  - Suspicious for malignancy  - See comment    02/08/2021 Imaging   CT CHEST WITHOUT CONTRAST AND CT OF ABDOMEN AND PELVIS WITH CONTRAST  IMPRESSION: 1. Findings are highly suspicious for a hypovascular mass in the posterior aspect of the head of the pancreas, as detailed above. This appears associated with persistent narrowing of the distal common bile duct and abrupt termination of the pancreatic duct. Common bile duct stent appears appropriately located, but is associated with some persistent dilatation of the common bile duct. 2. 1.2 x 0.7 cm ground-glass attenuation nodule in the right upper lobe. Initial follow-up with CT at 6-12 months is recommended to confirm persistence. If persistent, repeat CT is recommended every 2 years until 5 years of stability has been established. This recommendation follows the consensus statement: Guidelines for Management of Incidental Pulmonary Nodules Detected on CT Images: From the Fleischner Society 2017; Radiology 2017; 284:228-243. 3. Multiple liver lesions, smallest of which likely represent tiny simple cysts. The larger lesion in the left lobe of the liver has imaging characteristics once again most suggestive  of a  cavernous hemangioma. 4. Aortic atherosclerosis, in addition to left anterior descending coronary artery disease. Please note that although the presence of coronary artery calcium documents the presence of coronary artery disease, the severity of this disease and any potential stenosis cannot be assessed on this non-gated CT examination. Assessment for potential risk factor modification, dietary therapy or pharmacologic therapy may be warranted, if clinically indicated. 5. Additional incidental findings, as above.    02/09/2021 Procedure   ERCP under the care of Dr. Rush Landmark   IMPRESSION: Limited images during ERCP demonstrates removal plastic biliary stent and placement of a new metallic biliary stent. Please refer to the dictated operative report for full details of intraoperative findings and procedure.   02/09/2021 Pathology Results   CASE: [id number]  FINAL MICROSCOPIC DIAGNOSIS:  A. PANCREAS, HEAD OF LESION, FINE NEEDLE ASPIRATION:  - Malignant cells consistent with adenocarcinoma    02/09/2021 Procedure   EGD Impression: - No gross lesions in esophagus. Z-line regular, 40 cm from the incisors. - Severe gastritis noted. No other gross lesions in the stomach. Biopsied. - Plastic biliary stent in the duodenum. Removed. Revealed a patent biliary sphincterotomy was found. - No gross lesions in the duodenal bulb, in the first portion of the duodenum and in the second portion of the duodenum. EUS Impression: - Hyperechoic material consistent with sludge was visualized endosonographically in the gallbladder. - There was a suggestion of a stricture in the lower third of the main bile duct leading to dilation in the middle third of the main bile duct and in the upper third of the main bile duct with evidence of hyperechoic material consistent with sludge within the common bile duct. - A mass-like region was identified in the pancreatic head where the CBD narrowed and where the  upstream PD dilation was noted. This is not the most confluent of masses however. Cytology results are pending. However, the endosonographic appearance is suggestive of potential pancreatic adenocarcinoma. This was staged T2 N0 Mx by endosonographic criteria. The staging applies if malignancy is confirmed. Fine needle biopsy performed. - No malignant-appearing lymph nodes were visualized in the celiac region (level 20), peripancreatic region and porta hepatis region.   02/09/2021 Cancer Staging   Staging form: Exocrine Pancreas, AJCC 8th Edition - Clinical stage from 02/09/2021: Stage IB (cT2, cN0, cM0) - Signed by Truitt Merle, MD on 02/14/2021  Total positive nodes: 0    02/13/2021 Initial Diagnosis   Pancreatic cancer ([place])   02/28/2021 Imaging   PET  Signs of pancreatic head mass with suspected hepatic metastatic lesion in the RIGHT hepatic lobe as described.   Equivocal uptake in adjacent lymph nodes, potentially reactive in the setting of biliary stent. Note the biliary stent placement is in the mid to distal common bile duct but appears similar to recent ERCP images.   Stable appearance of ground-glass in the RIGHT upper lobe. This does not display increased FDG uptake and would be atypical for metastatic lesion based on appearance and location. Suggest six-month follow-up for further evaluation.   RIGHT thyroid uptake is slightly asymmetric potentially associated with small nodule. Recommend thyroid ultrasound with biopsy of focal abnormality is seen.(Ref: J Am Coll Radiol. 2015 Feb;12(2): 143-50).   03/02/2021 -  Chemotherapy    Patient is on Treatment Plan: PANCREAS MODIFIED FOLFIRINOX Q14D X 4 CYCLES          CURRENT THERAPY:  First line FOLFIRINOX q2weeks, starting 03/02/21  INTERVAL HISTORY:  Robin [name] is here for cycle  2 chemo.    All other systems were reviewed with the patient and are negative.  She tolerated first cycle chemotherapy well overall.  She had  mild to moderate fatigue, mild diarrhea, low appetite for 3 to 4 days, and recovered well afterwards.  No fever or chills, or other complaints.  Weight is stable.  MEDICAL HISTORY:  Past Medical History:  Diagnosis Date   Asthma    Bipolar disorder ([place])    Diverticulosis    Gestational hypertension    HA (headache)    History of borderline personality disorder    Hypersomnia, persistent 04/29/2013   IBS (irritable bowel syndrome)    Memory loss, short term    [name] 04-03-13    Migraine    Pancreatic cancer ([place])    Reflux esophagitis    Seasonal allergies     SURGICAL HISTORY: Past Surgical History:  Procedure Laterality Date   ABDOMINAL HYSTERECTOMY  2000   BILIARY BRUSHING  01/13/2021   Procedure: BILIARY BRUSHING;  Surgeon: Gatha Mayer, MD;  Location: [facility];  Service: Endoscopy;;   BILIARY DILATION  02/09/2021   Procedure: BILIARY DILATION;  Surgeon: Irving Copas., MD;  Location: [place];  Service: Gastroenterology;;   BILIARY STENT PLACEMENT N/A 01/13/2021   Procedure: BILIARY STENT PLACEMENT;  Surgeon: Gatha Mayer, MD;  Location: [facility];  Service: Endoscopy;  Laterality: N/A;   BILIARY STENT PLACEMENT  02/09/2021   Procedure: BILIARY STENT PLACEMENT;  Surgeon: Irving Copas., MD;  Location: [place];  Service: Gastroenterology;;   BIOPSY  01/13/2021   Procedure: BIOPSY;  Surgeon: Gatha Mayer, MD;  Location: [facility];  Service: Endoscopy;;   BIOPSY  02/09/2021   Procedure: BIOPSY;  Surgeon: Irving Copas., MD;  Location: [facility]COPY;  Service: Gastroenterology;;   COLONOSCOPY  01/2020   ERCP N/A 01/13/2021   Procedure: ENDOSCOPIC RETROGRADE CHOLANGIOPANCREATOGRAPHY (ERCP);  Surgeon: Gatha Mayer, MD;  Location: [facility];  Service: Endoscopy;  Laterality: N/A;   ERCP N/A 02/09/2021   Procedure: ENDOSCOPIC RETROGRADE CHOLANGIOPANCREATOGRAPHY (ERCP);  Surgeon: Irving Copas., MD;  Location:  [place];  Service: Gastroenterology;  Laterality: N/A;   ESOPHAGOGASTRODUODENOSCOPY (EGD) WITH PROPOFOL N/A 02/09/2021   Procedure: ESOPHAGOGASTRODUODENOSCOPY (EGD) WITH PROPOFOL;  Surgeon: Rush Landmark Telford Nab., MD;  Location: [place];  Service: Gastroenterology;  Laterality: N/A;   EUS N/A 02/09/2021   Procedure: UPPER ENDOSCOPIC ULTRASOUND (EUS) LINEAR;  Surgeon: Irving Copas., MD;  Location: [place];  Service: Gastroenterology;  Laterality: N/A;   FINE NEEDLE ASPIRATION  02/09/2021   Procedure: FINE NEEDLE ASPIRATION (FNA) LINEAR;  Surgeon: Irving Copas., MD;  Location: [facility];  Service: Gastroenterology;;   KNEE SURGERY Right 96,98,00,02,05,2014   NASAL SINUS SURGERY     NOSE SURGERY     [phone]   PORTACATH PLACEMENT Right 02/17/2021   Procedure: INSERTION PORT-A-CATH;  Surgeon: Dwan Bolt, MD;  Location: [facility];  Service: General;  Laterality: Right;  LMA ANESTHESIA   REPLACEMENT TOTAL KNEE Right    SPHINCTEROTOMY  01/13/2021   Procedure: SPHINCTEROTOMY;  Surgeon: Gatha Mayer, MD;  Location[facility];  Service: Endoscopy;;   STENT REMOVAL  02/09/2021   Procedure: Lavell Islam REMOVAL;  Surgeon: Irving Copas., MD;  Location: [place];  Service: Gastroenterology;;   WRIST SURGERY Right [phone]    I have reviewed the social history and family history with the patient and they are unchanged from previous note.  ALLERGIES:  is allergic to aspirin, bee  venom, penicillins, bactrim [sulfamethoxazole-trimethoprim], and sulfa antibiotics.  MEDICATIONS:  Current Outpatient Medications  Medication Sig Dispense Refill   azelastine (ASTELIN) 0.1 % nasal spray Place 2 sprays into both nostrils 2 (two) times daily. 30 mL 12   clobetasol cream (TEMOVATE) 1.88 % Apply 1 application topically 2 (two) times daily. 30 g 0   cyclobenzaprine (FLEXERIL) 5 MG tablet TAKE 1 TABLET(5 MG) BY MOUTH AT BEDTIME AS NEEDED FOR LOWER BACK PAIN OR  SEVERE HEADACHE 30 tablet 0   dicyclomine (BENTYL) 10 MG capsule Take 1 capsule (10 mg total) by mouth in the morning and at bedtime. 60 capsule 0   diphenoxylate-atropine (LOMOTIL) 2.5-0.025 MG tablet Take 1-2 tabs by mouth 4 times a day as needed for loose stools. 30 tablet 0   docusate sodium (COLACE) 100 MG capsule Take 100 mg by mouth 2 (two) times daily.     EPINEPHrine 0.3 mg/0.3 mL IJ SOAJ injection Inject 0.3 mg into the muscle as needed for anaphylaxis. INJECT 0.3 MLS (0.3 MG TOTAL) INTO THE MUSCLE ONCE 1 each 0   fluticasone (FLONASE) 50 MCG/ACT nasal spray Place 1 spray into both nostrils 2 (two) times daily.     gabapentin (NEURONTIN) 300 MG capsule Take 300 mg by mouth daily as needed (pain).     LATUDA 40 MG TABS tablet Take 1 tablet (40 mg total) by mouth daily with breakfast. 90 tablet 0   lidocaine-prilocaine (EMLA) cream Apply 1 application topically as needed. 30 g 2   lidocaine-prilocaine (EMLA) cream Apply to affected area once 30 g 3   magic mouthwash w/lidocaine SOLN Take 10 mLs by mouth 4 (four) times daily as needed for mouth pain. 10 mls swish and spit by mouth four times daily as needed 470 mL 2   montelukast (SINGULAIR) 10 MG tablet Take 10 mg by mouth daily.     Multiple Vitamin (MULTIVITAMIN WITH MINERALS) TABS tablet Take 1 tablet by mouth daily. 30 tablet 2   ondansetron (ZOFRAN) 8 MG tablet Take 1 tablet (8 mg total) by mouth every 8 (eight) hours as needed for nausea or vomiting. Starting on day 3 after chemotherapy 30 tablet 2   ondansetron (ZOFRAN) 8 MG tablet Take 1 tablet (8 mg total) by mouth 2 (two) times daily as needed. Start on day 3 after chemotherapy. 30 tablet 1   oxyCODONE (OXY IR/ROXICODONE) 5 MG immediate release tablet Take 1 tablet (5 mg total) by mouth every 4 (four) hours as needed for severe pain. 60 tablet 0   prochlorperazine (COMPAZINE) 10 MG tablet Take 1 tablet (10 mg total) by mouth every 6 (six) hours as needed. 30 tablet 2    prochlorperazine (COMPAZINE) 10 MG tablet Take 1 tablet (10 mg total) by mouth every 6 (six) hours as needed (Nausea or vomiting). 30 tablet 1   No current facility-administered medications for this visit.   Facility-Administered Medications Ordered in Other Visits  Medication Dose Route Frequency Provider Last Rate Last Admin   fluorouracil (ADRUCIL) 5,000 mg in sodium chloride 0.9 % 150 mL chemo infusion  2,325 mg/m2 (Treatment Plan Recorded) Intravenous 1 day or 1 dose Truitt Merle, MD   5,000 mg at 03/16/21 1723    PHYSICAL EXAMINATION: ECOG PERFORMANCE STATUS: 1 - Symptomatic but completely ambulatory  Weight 207 pounds, blood pressure 130/77, heart rate 74, respirate 17, temperature 36.9, pulse ox 100% on room air   Due to COVID19 we will limit examination to appearance. Patient had no complaints.  GENERAL:alert, no  distress and comfortable SKIN: skin color normal, no rashes or significant lesions EYES: normal, Conjunctiva are pink and non-injected, sclera clear  NEURO: alert & oriented x 3 with fluent speech  LABORATORY DATA:  I have reviewed the data as listed CBC Latest Ref Rng & Units 03/16/2021 02/28/2021 02/10/2021  WBC 4.0 - 10.5 K/uL 6.3 9.3 14.7(H)  Hemoglobin 12.0 - 15.0 g/dL 11.7(L) 12.5 12.4  Hematocrit 36.0 - 46.0 % 36.5 38.6 39.3  Platelets 150 - 400 K/uL 227 249 311     CMP Latest Ref Rng & Units 03/16/2021 02/28/2021 02/10/2021  Glucose 70 - 99 mg/dL 88 88 100(H)  BUN 6 - 20 mg/dL 12 11 8   Creatinine 0.44 - 1.00 mg/dL 0.81 0.89 1.00  Sodium 135 - 145 mmol/L 141 143 138  Potassium 3.5 - 5.1 mmol/L 3.7 3.8 3.8  Chloride 98 - 111 mmol/L 107 108 104  CO2 22 - 32 mmol/L 26 26 24   Calcium 8.9 - 10.3 mg/dL 9.1 9.7 9.4  Total Protein 6.5 - 8.1 g/dL 7.0 7.2 6.9  Total Bilirubin 0.3 - 1.2 mg/dL 0.6 0.9 1.4(H)  Alkaline Phos 38 - 126 U/L 60 67 157(H)  AST 15 - 41 U/L 27 18 91(H)  ALT 0 - 44 U/L 34 22 174(H)      RADIOGRAPHIC STUDIES: I have personally reviewed the  radiological images as listed and agreed with the findings in the report. No results found.   ASSESSMENT & PLAN:  Robin [name] is a 55 y.o. female with   1. Adenocarcinoma of the pancreas diagnosed in June 2022, cT2N0M1 with properble liver met  -Her PET scan showed a hypermetabolic liver lesion in the right lobe, adjacent to right kidney, suspicious for oligo liver metastasis. No other definitive distant mets. This was not visible on ultrasound, I reviewed the ultrasound findings with her today.  Liver biopsy is not feasible   -She began C1 FOLFIRINOX 03/02/21.   -She tolerated first cycle chemotherapy well -Lab reviewed, adequate for treatment, will proceed with cycle 2 chemo today -Her insurance denied prophylactic G-CSF.   2. Right upper quadrant/epigastric abdominal pain -Patient currently being treated with oxycodone 1-2 tablets per day and tylenol if needed.    3. Family history for cancer  -Maternal great-grandmother, grandmother, and 2 sisters with breast cancer -The patient has 1 biologic daughter -The patient is not interested in referral to genetic counseling at this time but will call us if she changes her mind   4. Social -The patient lives with her wife and has 1 adopted son and a step son -The patient works at [employer] ~5 hours per day -Discussed with the patient to let us know if she needs paperwork completed for any leave of absence for work      PLAN: -Liver ultrasound findings discussed with patient -Lab reviewed, adequate for treatment, will proceed cycle 2 FOLFIRINOX today, no G-CSF. -f/u in 2 weeks before cycle 3     No problem-specific Assessment & Plan notes found for this encounter.   No orders of the defined types were placed in this encounter.  All questions were answered. The patient knows to call the clinic with any problems, questions or concerns. No barriers to learning was detected. The total time spent in the appointment was 30  minutes.     Truitt Merle, MD 03/16/2021

## 2021-03-16 NOTE — Progress Notes (Signed)
Pt is approved w/ the [place] for $4,500 from 03/16/21 to 03/16/22 for Eloxatin, Camptosar and Adrucil.  She is also approved for the $1000 [employer].  I gave her an expense sheet and my card for any questions or concerns she may have in the future.

## 2021-03-16 NOTE — Progress Notes (Signed)
Continue same chemo doses as cycle 1 per MD.  Acquanetta Belling, RPH, BCPS, BCOP 03/16/2021. 12:30 PM

## 2021-03-16 NOTE — Progress Notes (Signed)
[facility] CSW Progress Notes  Met w patient in infusion waiting room.  Enrolled in [facility] and provided first disbursement.  Provided information on Cancer Care small grant and mobile markets.  She has been enrolled in [employer] and will use as she needs.  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  Talked about ways to increase knowledge about her particular cancer via reliable websites.  She also hopes to join the GI Cancer support group next month.  She has our contact information for needs/support and can reach out as needed.  Edwyna Shell, LCSW Clinical Social Worker Phone:  [phone]

## 2021-03-16 NOTE — Progress Notes (Signed)
Per [name], okay to increase rate of 5FU pump to administer of 44 hours (completing at approximately 1330 on Saturday 7/23/222).  New rate will be 5.24ml/hr.

## 2021-03-16 NOTE — Patient Instructions (Signed)
[facility] ONCOLOGY  Discharge Instructions: Thank you for choosing [facility] to provide your oncology and hematology care.   If you have a lab appointment with the [facility], please go directly to the [facility] and check in at the registration area.   Wear comfortable clothing and clothing appropriate for easy access to any Portacath or PICC line.   We strive to give you quality time with your provider. You may need to reschedule your appointment if you arrive late (15 or more minutes).  Arriving late affects you and other patients whose appointments are after yours.  Also, if you miss three or more appointments without notifying the office, you may be dismissed from the clinic at the provider's discretion.      For prescription refill requests, have your pharmacy contact our office and allow 72 hours for refills to be completed.    Today you received the following chemotherapy and/or immunotherapy agents: oxaliplatin/irinotecan/leucovorin/5FU.      To help prevent nausea and vomiting after your treatment, we encourage you to take your nausea medication as directed.  BELOW ARE SYMPTOMS THAT SHOULD BE REPORTED IMMEDIATELY: *FEVER GREATER THAN 100.4 F (38 C) OR HIGHER *CHILLS OR SWEATING *NAUSEA AND VOMITING THAT IS NOT CONTROLLED WITH YOUR NAUSEA MEDICATION *UNUSUAL SHORTNESS OF BREATH *UNUSUAL BRUISING OR BLEEDING *URINARY PROBLEMS (pain or burning when urinating, or frequent urination) *BOWEL PROBLEMS (unusual diarrhea, constipation, pain near the anus) TENDERNESS IN MOUTH AND THROAT WITH OR WITHOUT PRESENCE OF ULCERS (sore throat, sores in mouth, or a toothache) UNUSUAL RASH, SWELLING OR PAIN  UNUSUAL VAGINAL DISCHARGE OR ITCHING   Items with * indicate a potential emergency and should be followed up as soon as possible or go to the Emergency Department if any problems should occur.  Please show the CHEMOTHERAPY ALERT CARD or  IMMUNOTHERAPY ALERT CARD at check-in to the Emergency Department and triage nurse.  Should you have questions after your visit or need to cancel or reschedule your appointment, please contact [facility]  Dept: [phone]  and follow the prompts.  Office hours are 8:00 a.m. to 4:30 p.m. Monday - Friday. Please note that voicemails left after 4:00 p.m. may not be returned until the following business day.  We are closed weekends and major holidays. You have access to a nurse at all times for urgent questions. Please call the main number to the clinic Dept: [phone] and follow the prompts.   For any non-urgent questions, you may also contact your provider using MyChart. We now offer e-Visits for anyone 78 and older to request care online for non-urgent symptoms. For details visit [email or web address].   Also download the MyChart app! Go to the app store, search "MyChart", open the app, select [facility], and log in with your MyChart username and password.  Due to Covid, a mask is required upon entering the hospital/clinic. If you do not have a mask, one will be given to you upon arrival. For doctor visits, patients may have 1 support person aged 41 or older with them. For treatment visits, patients cannot have anyone with them due to current Covid guidelines and our immunocompromised population.

## 2021-03-17 ENCOUNTER — Encounter: Payer: Self-pay | Admitting: Hematology

## 2021-03-17 LAB — CANCER ANTIGEN 19-9: CA 19-9: 117 U/mL — ABNORMAL HIGH (ref 0–35)

## 2021-03-17 NOTE — Progress Notes (Signed)
Late entry.  I met with Robin [name] during her infusion appt yesterday.  She states that she has not had significant diarrhea.  She does state she has a mouth sore.  I did provide written information regarding chemotherapy induced diarrhea for future reference.  I provided written material regarding care of mouth sores.  I informed Dr Burr Medico of her c/o mouth sores. Robin [name] verbalized understanding of the information provided.

## 2021-03-18 ENCOUNTER — Inpatient Hospital Stay: Payer: 59

## 2021-03-18 ENCOUNTER — Other Ambulatory Visit: Payer: Self-pay

## 2021-03-18 VITALS — BP 115/64 | HR 82 | Temp 98.0°F

## 2021-03-18 DIAGNOSIS — C250 Malignant neoplasm of head of pancreas: Secondary | ICD-10-CM

## 2021-03-18 DIAGNOSIS — Z5111 Encounter for antineoplastic chemotherapy: Secondary | ICD-10-CM | POA: Diagnosis not present

## 2021-03-18 MED ORDER — HEPARIN SOD (PORK) LOCK FLUSH 100 UNIT/ML IV SOLN
500.0000 [IU] | Freq: Once | INTRAVENOUS | Status: AC | PRN
Start: 2021-03-18 — End: 2021-03-18
  Administered 2021-03-18: 500 [IU]
  Filled 2021-03-18: qty 5

## 2021-03-18 MED ORDER — SODIUM CHLORIDE 0.9% FLUSH
10.0000 mL | INTRAVENOUS | Status: DC | PRN
  Administered 2021-03-18: 10 mL
  Filled 2021-03-18: qty 10

## 2021-03-21 ENCOUNTER — Other Ambulatory Visit (HOSPITAL_COMMUNITY): Payer: 59 | Admitting: General Practice

## 2021-03-23 ENCOUNTER — Telehealth: Payer: Self-pay

## 2021-03-23 NOTE — Telephone Encounter (Signed)
This nurse spoke with patient who stated that the tingling in her toes and fingertips are getting worse.  This nurse verified with patient if she was taking her Gabapentin.  Patient stated that she was not taking it.  This nurse spoke with provider and was given these recommendations for patient.  Per Robin Rue, NP advised to start taking Gabapentin 300 mg at night and Vitamin B-complex OTC daily.  Patient acknowledged understanding.  No further questions or concerns at this time.  Patient know to call clinic with any concerns or questions.

## 2021-03-30 ENCOUNTER — Inpatient Hospital Stay (HOSPITAL_BASED_OUTPATIENT_CLINIC_OR_DEPARTMENT_OTHER): Payer: 59 | Admitting: Hematology

## 2021-03-30 ENCOUNTER — Inpatient Hospital Stay: Payer: 59

## 2021-03-30 ENCOUNTER — Other Ambulatory Visit: Payer: Self-pay

## 2021-03-30 ENCOUNTER — Encounter: Payer: Self-pay | Admitting: Hematology

## 2021-03-30 ENCOUNTER — Inpatient Hospital Stay: Payer: 59 | Attending: Hematology

## 2021-03-30 VITALS — BP 144/82 | HR 74 | Temp 98.3°F | Resp 19 | Ht 69.5 in | Wt 205.8 lb

## 2021-03-30 DIAGNOSIS — C250 Malignant neoplasm of head of pancreas: Secondary | ICD-10-CM | POA: Insufficient documentation

## 2021-03-30 DIAGNOSIS — T451X5A Adverse effect of antineoplastic and immunosuppressive drugs, initial encounter: Secondary | ICD-10-CM | POA: Insufficient documentation

## 2021-03-30 DIAGNOSIS — G620 Drug-induced polyneuropathy: Secondary | ICD-10-CM | POA: Diagnosis not present

## 2021-03-30 DIAGNOSIS — R1013 Epigastric pain: Secondary | ICD-10-CM | POA: Diagnosis not present

## 2021-03-30 DIAGNOSIS — Z79899 Other long term (current) drug therapy: Secondary | ICD-10-CM | POA: Insufficient documentation

## 2021-03-30 DIAGNOSIS — Z5111 Encounter for antineoplastic chemotherapy: Secondary | ICD-10-CM | POA: Diagnosis present

## 2021-03-30 DIAGNOSIS — Z95828 Presence of other vascular implants and grafts: Secondary | ICD-10-CM

## 2021-03-30 LAB — CMP (CANCER CENTER ONLY)
ALT: 26 U/L (ref 0–44)
AST: 20 U/L (ref 15–41)
Albumin: 3.7 g/dL (ref 3.5–5.0)
Alkaline Phosphatase: 59 U/L (ref 38–126)
Anion gap: 8 (ref 5–15)
BUN: 13 mg/dL (ref 6–20)
CO2: 25 mmol/L (ref 22–32)
Calcium: 9.7 mg/dL (ref 8.9–10.3)
Chloride: 108 mmol/L (ref 98–111)
Creatinine: 1.02 mg/dL — ABNORMAL HIGH (ref 0.44–1.00)
GFR, Estimated: >60 mL/min (ref >60)
Glucose, Bld: 87 mg/dL (ref 70–99)
Potassium: 4.4 mmol/L (ref 3.5–5.1)
Sodium: 141 mmol/L (ref 135–145)
Total Bilirubin: 0.4 mg/dL (ref 0.3–1.2)
Total Protein: 6.9 g/dL (ref 6.5–8.1)

## 2021-03-30 LAB — CBC WITH DIFFERENTIAL (CANCER CENTER ONLY)
Abs Immature Granulocytes: 0.01 10*3/uL (ref 0.00–0.07)
Basophils Absolute: 0 10*3/uL (ref 0.0–0.1)
Basophils Relative: 0 %
Eosinophils Absolute: 0.2 10*3/uL (ref 0.0–0.5)
Eosinophils Relative: 3 %
HCT: 37.3 % (ref 36.0–46.0)
Hemoglobin: 12.2 g/dL (ref 12.0–15.0)
Immature Granulocytes: 0 %
Lymphocytes Relative: 44 %
Lymphs Abs: 2.7 10*3/uL (ref 0.7–4.0)
MCH: 27.7 pg (ref 26.0–34.0)
MCHC: 32.7 g/dL (ref 30.0–36.0)
MCV: 84.8 fL (ref 80.0–100.0)
Monocytes Absolute: 0.5 10*3/uL (ref 0.1–1.0)
Monocytes Relative: 9 %
Neutro Abs: 2.7 10*3/uL (ref 1.7–7.7)
Neutrophils Relative %: 44 %
Platelet Count: 184 10*3/uL (ref 150–400)
RBC: 4.4 MIL/uL (ref 3.87–5.11)
RDW: 14.2 % (ref 11.5–15.5)
WBC Count: 6.1 10*3/uL (ref 4.0–10.5)
nRBC: 0 % (ref 0.0–0.2)

## 2021-03-30 MED ORDER — SODIUM CHLORIDE 0.9 % IV SOLN
150.0000 mg | Freq: Once | INTRAVENOUS | Status: AC
  Administered 2021-03-30: 150 mg via INTRAVENOUS
  Filled 2021-03-30: qty 150

## 2021-03-30 MED ORDER — ATROPINE SULFATE 1 MG/ML IJ SOLN
0.5000 mg | Freq: Once | INTRAMUSCULAR | Status: AC | PRN
  Administered 2021-03-30: 0.5 mg via INTRAVENOUS

## 2021-03-30 MED ORDER — SODIUM CHLORIDE 0.9 % IV SOLN
Freq: Once | INTRAVENOUS | Status: AC
Start: 2021-03-30 — End: 2021-03-30
  Filled 2021-03-30: qty 250

## 2021-03-30 MED ORDER — SODIUM CHLORIDE 0.9 % IV SOLN
2335.0000 mg/m2 | INTRAVENOUS | Status: DC
  Administered 2021-03-30: 5000 mg via INTRAVENOUS
  Filled 2021-03-30: qty 100

## 2021-03-30 MED ORDER — SODIUM CHLORIDE 0.9% FLUSH
10.0000 mL | Freq: Once | INTRAVENOUS | Status: AC
  Administered 2021-03-30: 10 mL
  Filled 2021-03-30: qty 10

## 2021-03-30 MED ORDER — PALONOSETRON HCL INJECTION 0.25 MG/5ML
0.2500 mg | Freq: Once | INTRAVENOUS | Status: AC
  Administered 2021-03-30: 0.25 mg via INTRAVENOUS

## 2021-03-30 MED ORDER — ATROPINE SULFATE 1 MG/ML IJ SOLN
INTRAMUSCULAR | Status: AC
  Filled 2021-03-30: qty 1

## 2021-03-30 MED ORDER — PALONOSETRON HCL INJECTION 0.25 MG/5ML
INTRAVENOUS | Status: AC
  Filled 2021-03-30: qty 5

## 2021-03-30 MED ORDER — SODIUM CHLORIDE 0.9 % IV SOLN
400.0000 mg/m2 | Freq: Once | INTRAVENOUS | Status: AC
  Administered 2021-03-30: 856 mg via INTRAVENOUS
  Filled 2021-03-30: qty 42.8

## 2021-03-30 MED ORDER — SODIUM CHLORIDE 0.9 % IV SOLN
150.0000 mg/m2 | Freq: Once | INTRAVENOUS | Status: AC
  Administered 2021-03-30: 320 mg via INTRAVENOUS
  Filled 2021-03-30: qty 15

## 2021-03-30 MED ORDER — DEXAMETHASONE SODIUM PHOSPHATE 100 MG/10ML IJ SOLN
10.0000 mg | Freq: Once | INTRAMUSCULAR | Status: AC
  Administered 2021-03-30: 10 mg via INTRAVENOUS
  Filled 2021-03-30: qty 10

## 2021-03-30 NOTE — Progress Notes (Signed)
[facility]   [phone] Fax:([phone]   Clinic Follow up Note   Patient Care Team: [name], MD as PCP - General (Family Medicine) Jerline Pain, MD as PCP - Cardiology (Cardiology) End, Harrell Gave, MD as Consulting Physician (Cardiology) Alda Berthold, DO as Consulting Physician (Neurology) Heilingoetter, Tobe Sos, PA-C as Physician Assistant (Physician Assistant) Truitt Merle, MD as Consulting Physician (Oncology) Dwan Bolt, MD as Consulting Physician (General Surgery)  Date of Service:  03/30/2021  CHIEF COMPLAINT: f/u of pancreatic cancer  SUMMARY OF ONCOLOGIC HISTORY: Oncology History Overview Note  Cancer Staging Pancreatic cancer [facility]) Staging form: Exocrine Pancreas, AJCC 8th Edition - Clinical stage from 02/09/2021: Stage IV (cT2, cN0, cM1) - Signed by Truitt Merle, MD on 03/16/2021 Total positive nodes: 0    Pancreatic cancer ([name])  01/11/2021 Imaging   CT Abdomen/Pelvis  IMPRESSION: Multiple low-attenuation liver lesions, which cannot be characterized on this unenhanced exam. Abdomen MRI without and with contrast is recommended for further characterization.   Diffuse biliary ductal dilatation, with soft tissue prominence in the region of the pancreatic head. Pancreatic mass cannot be excluded on this unenhanced exam. Recommend abdomen MRI and MRCP without and with contrast for further evaluation.   Colonic diverticulosis, without radiographic evidence of diverticulitis.     01/12/2021 Imaging   MRI ABDOMEN WITHOUT AND WITH CONTRAST (INCLUDING MRCP)  IMPRESSION: Biliary duct distension of both intra and extrahepatic biliary tree with abrupt caliber change but with no visible mass on imaging. Caliber change more abrupt than expected for stricture and the biliary tree was top-normal size at 6 mm in 2017. Endoscopic assessment is suggested to exclude an occult biliary lesion or atypical stricture.   Lobulated T2  bright lesion in the medial segment of the LEFT hepatic lobe that favors a sclerosed hemangioma. There is some internal septation suggested and the filling pattern is slightly atypical. Consider 3 to six-month follow-up with extended delays following contrast to 7 minutes better assess filling characteristics, differential consideration would include a biliary cystadenoma with septal enhancement.   01/13/2021 Procedure   ERCP under the care of Dr. Carlean Purl  IMPRESSION: 1. Dilation of the common bile duct and main pancreatic duct. 2. Sphincterotomy and placement of a plastic biliary stent.    01/13/2021 Pathology Results   CASE: [id number]   A. COMMON BILE DUCT, STRICTURE, #1, BRUSHING:   FINAL MICROSCOPIC DIAGNOSIS:  - Suspicious for malignancy  - See comment    02/08/2021 Imaging   CT CHEST WITHOUT CONTRAST AND CT OF ABDOMEN AND PELVIS WITH CONTRAST  IMPRESSION: 1. Findings are highly suspicious for a hypovascular mass in the posterior aspect of the head of the pancreas, as detailed above. This appears associated with persistent narrowing of the distal common bile duct and abrupt termination of the pancreatic duct. Common bile duct stent appears appropriately located, but is associated with some persistent dilatation of the common bile duct. 2. 1.2 x 0.7 cm ground-glass attenuation nodule in the right upper lobe. Initial follow-up with CT at 6-12 months is recommended to confirm persistence. If persistent, repeat CT is recommended every 2 years until 5 years of stability has been established. This recommendation follows the consensus statement: Guidelines for Management of Incidental Pulmonary Nodules Detected on CT Images: From the Fleischner Society 2017; Radiology 2017; 284:228-243. 3. Multiple liver lesions, smallest of which likely represent tiny simple cysts. The larger lesion in the left lobe of the liver has imaging characteristics once again most suggestive  of a  cavernous hemangioma. 4. Aortic atherosclerosis, in addition to left anterior descending coronary artery disease. Please note that although the presence of coronary artery calcium documents the presence of coronary artery disease, the severity of this disease and any potential stenosis cannot be assessed on this non-gated CT examination. Assessment for potential risk factor modification, dietary therapy or pharmacologic therapy may be warranted, if clinically indicated. 5. Additional incidental findings, as above.    02/09/2021 Procedure   ERCP under the care of Dr. Rush Landmark   IMPRESSION: Limited images during ERCP demonstrates removal plastic biliary stent and placement of a new metallic biliary stent. Please refer to the dictated operative report for full details of intraoperative findings and procedure.   02/09/2021 Pathology Results   CASE: [id number]  FINAL MICROSCOPIC DIAGNOSIS:  A. PANCREAS, HEAD OF LESION, FINE NEEDLE ASPIRATION:  - Malignant cells consistent with adenocarcinoma    02/09/2021 Procedure   EGD Impression: - No gross lesions in esophagus. Z-line regular, 40 cm from the incisors. - Severe gastritis noted. No other gross lesions in the stomach. Biopsied. - Plastic biliary stent in the duodenum. Removed. Revealed a patent biliary sphincterotomy was found. - No gross lesions in the duodenal bulb, in the first portion of the duodenum and in the second portion of the duodenum. EUS Impression: - Hyperechoic material consistent with sludge was visualized endosonographically in the gallbladder. - There was a suggestion of a stricture in the lower third of the main bile duct leading to dilation in the middle third of the main bile duct and in the upper third of the main bile duct with evidence of hyperechoic material consistent with sludge within the common bile duct. - A mass-like region was identified in the pancreatic head where the CBD narrowed and where the  upstream PD dilation was noted. This is not the most confluent of masses however. Cytology results are pending. However, the endosonographic appearance is suggestive of potential pancreatic adenocarcinoma. This was staged T2 N0 Mx by endosonographic criteria. The staging applies if malignancy is confirmed. Fine needle biopsy performed. - No malignant-appearing lymph nodes were visualized in the celiac region (level 20), peripancreatic region and porta hepatis region.   02/09/2021 Cancer Staging   Staging form: Exocrine Pancreas, AJCC 8th Edition - Clinical stage from 02/09/2021: Stage IV (cT2, cN0, cM1) - Signed by Truitt Merle, MD on 03/16/2021  Total positive nodes: 0    02/13/2021 Initial Diagnosis   Pancreatic cancer ([place])   02/28/2021 Imaging   PET  Signs of pancreatic head mass with suspected hepatic metastatic lesion in the RIGHT hepatic lobe as described.   Equivocal uptake in adjacent lymph nodes, potentially reactive in the setting of biliary stent. Note the biliary stent placement is in the mid to distal common bile duct but appears similar to recent ERCP images.   Stable appearance of ground-glass in the RIGHT upper lobe. This does not display increased FDG uptake and would be atypical for metastatic lesion based on appearance and location. Suggest six-month follow-up for further evaluation.   RIGHT thyroid uptake is slightly asymmetric potentially associated with small nodule. Recommend thyroid ultrasound with biopsy of focal abnormality is seen.(Ref: J Am Coll Radiol. 2015 Feb;12(2): 143-50).   03/02/2021 -  Chemotherapy    Patient is on Treatment Plan: PANCREAS MODIFIED FOLFIRINOX Q14D X 4 CYCLES          CURRENT THERAPY:  First line FOLFIRINOX q2weeks, starting 03/02/21  INTERVAL HISTORY:  Amal Renbarger is here for a  follow up of pancreatic cancer. She was last seen by me on 03/16/21. She presents to the clinic alone. She reports tingling in her fingers and toes,  rated 2-3/10. She notes this is intermittent and becomes bothersome when she walks and types. She reports some nausea, controlled with her current medications. She continues to work part time at [employer]; she notes they are very flexible with her schedule.   All other systems were reviewed with the patient and are negative.  MEDICAL HISTORY:  Past Medical History:  Diagnosis Date   Asthma    Bipolar disorder ([place])    Diverticulosis    Gestational hypertension    HA (headache)    History of borderline personality disorder    Hypersomnia, persistent 04/29/2013   IBS (irritable bowel syndrome)    Memory loss, short term    [name] 04-03-13    Migraine    Pancreatic cancer ([place])    Reflux esophagitis    Seasonal allergies     SURGICAL HISTORY: Past Surgical History:  Procedure Laterality Date   ABDOMINAL HYSTERECTOMY  2000   BILIARY BRUSHING  01/13/2021   Procedure: BILIARY BRUSHING;  Surgeon: Gatha Mayer, MD;  Location: [facility];  Service: Endoscopy;;   BILIARY DILATION  02/09/2021   Procedure: BILIARY DILATION;  Surgeon: Irving Copas., MD;  Location: [facility];  Service: Gastroenterology;;   BILIARY STENT PLACEMENT N/A 01/13/2021   Procedure: BILIARY STENT PLACEMENT;  Surgeon: Gatha Mayer, MD;  Location: [facility];  Service: Endoscopy;  Laterality: N/A;   BILIARY STENT PLACEMENT  02/09/2021   Procedure: BILIARY STENT PLACEMENT;  Surgeon: Irving Copas., MD;  Location: [place];  Service: Gastroenterology;;   BIOPSY  01/13/2021   Procedure: BIOPSY;  Surgeon: Gatha Mayer, MD;  Location: [facility];  Service: Endoscopy;;   BIOPSY  02/09/2021   Procedure: BIOPSY;  Surgeon: Irving Copas., MD;  [facility];  Service: Gastroenterology;;   COLONOSCOPY  01/2020   ERCP N/A 01/13/2021   Procedure: ENDOSCOPIC RETROGRADE CHOLANGIOPANCREATOGRAPHY (ERCP);  Surgeon: Gatha Mayer, MD;  Location: [facility];   Service: Endoscopy;  Laterality: N/A;   ERCP N/A 02/09/2021   Procedure: ENDOSCOPIC RETROGRADE CHOLANGIOPANCREATOGRAPHY (ERCP);  Surgeon: Irving Copas., MD;  Location: [place];  Service: Gastroenterology;  Laterality: N/A;   ESOPHAGOGASTRODUODENOSCOPY (EGD) WITH PROPOFOL N/A 02/09/2021   Procedure: ESOPHAGOGASTRODUODENOSCOPY (EGD) WITH PROPOFOL;  Surgeon: Rush Landmark Telford Nab., MD;  Location: [place];  Service: Gastroenterology;  Laterality: N/A;   EUS N/A 02/09/2021   Procedure: UPPER ENDOSCOPIC ULTRASOUND (EUS) LINEAR;  Surgeon: Irving Copas., MD;  Location: [place];  Service: Gastroenterology;  Laterality: N/A;   FINE NEEDLE ASPIRATION  02/09/2021   Procedure: FINE NEEDLE ASPIRATION (FNA) LINEAR;  Surgeon: Irving Copas., MD;  Location: [facility];  Service: Gastroenterology;;   KNEE SURGERY Right 96,98,00,02,05,2014   NASAL SINUS SURGERY     NOSE SURGERY     [phone]   PORTACATH PLACEMENT Right 02/17/2021   Procedure: INSERTION PORT-A-CATH;  Surgeon: Dwan Bolt, MD;  Location: [facility];  Service: General;  Laterality: Right;  LMA ANESTHESIA   REPLACEMENT TOTAL KNEE Right    SPHINCTEROTOMY  01/13/2021   Procedure: SPHINCTEROTOMY;  Surgeon: Gatha Mayer, MD;  Location[facility];  Service: Endoscopy;;   STENT REMOVAL  02/09/2021   Procedure: Lavell Islam REMOVAL;  Surgeon: Irving Copas., MD;  Location: [place];  Service: Gastroenterology;;   WRIST SURGERY Right [phone]  I have reviewed the social history and family history with the patient and they are unchanged from previous note.  ALLERGIES:  is allergic to aspirin, bee venom, penicillins, bactrim [sulfamethoxazole-trimethoprim], and sulfa antibiotics.  MEDICATIONS:  Current Outpatient Medications  Medication Sig Dispense Refill   azelastine (ASTELIN) 0.1 % nasal spray Place 2 sprays into both nostrils 2 (two) times daily. 30 mL 12   clobetasol cream (TEMOVATE)  7.16 % Apply 1 application topically 2 (two) times daily. 30 g 0   cyclobenzaprine (FLEXERIL) 5 MG tablet TAKE 1 TABLET(5 MG) BY MOUTH AT BEDTIME AS NEEDED FOR LOWER BACK PAIN OR SEVERE HEADACHE 30 tablet 0   dicyclomine (BENTYL) 10 MG capsule Take 1 capsule (10 mg total) by mouth in the morning and at bedtime. 60 capsule 0   diphenoxylate-atropine (LOMOTIL) 2.5-0.025 MG tablet Take 1-2 tabs by mouth 4 times a day as needed for loose stools. 30 tablet 0   docusate sodium (COLACE) 100 MG capsule Take 100 mg by mouth 2 (two) times daily.     EPINEPHrine 0.3 mg/0.3 mL IJ SOAJ injection Inject 0.3 mg into the muscle as needed for anaphylaxis. INJECT 0.3 MLS (0.3 MG TOTAL) INTO THE MUSCLE ONCE 1 each 0   fluticasone (FLONASE) 50 MCG/ACT nasal spray Place 1 spray into both nostrils 2 (two) times daily.     gabapentin (NEURONTIN) 300 MG capsule Take 300 mg by mouth daily as needed (pain).     LATUDA 40 MG TABS tablet Take 1 tablet (40 mg total) by mouth daily with breakfast. 90 tablet 0   lidocaine-prilocaine (EMLA) cream Apply 1 application topically as needed. 30 g 2   lidocaine-prilocaine (EMLA) cream Apply to affected area once 30 g 3   magic mouthwash w/lidocaine SOLN Take 10 mLs by mouth 4 (four) times daily as needed for mouth pain. 10 mls swish and spit by mouth four times daily as needed 470 mL 2   montelukast (SINGULAIR) 10 MG tablet Take 10 mg by mouth daily.     Multiple Vitamin (MULTIVITAMIN WITH MINERALS) TABS tablet Take 1 tablet by mouth daily. 30 tablet 2   ondansetron (ZOFRAN) 8 MG tablet Take 1 tablet (8 mg total) by mouth every 8 (eight) hours as needed for nausea or vomiting. Starting on day 3 after chemotherapy 30 tablet 2   ondansetron (ZOFRAN) 8 MG tablet Take 1 tablet (8 mg total) by mouth 2 (two) times daily as needed. Start on day 3 after chemotherapy. 30 tablet 1   oxyCODONE (OXY IR/ROXICODONE) 5 MG immediate release tablet Take 1 tablet (5 mg total) by mouth every 4 (four) hours  as needed for severe pain. 60 tablet 0   prochlorperazine (COMPAZINE) 10 MG tablet Take 1 tablet (10 mg total) by mouth every 6 (six) hours as needed. 30 tablet 2   prochlorperazine (COMPAZINE) 10 MG tablet Take 1 tablet (10 mg total) by mouth every 6 (six) hours as needed (Nausea or vomiting). 30 tablet 1   No current facility-administered medications for this visit.    PHYSICAL EXAMINATION: ECOG PERFORMANCE STATUS: 1 - Symptomatic but completely ambulatory  Vitals:   03/30/21 0949  BP: (!) 144/82  Pulse: 74  Resp: 19  Temp: 98.3 F (36.8 C)  SpO2: 100%   Wt Readings from Last 3 Encounters:  03/30/21 205 lb 12.8 oz (93.4 kg)  03/16/21 207 lb 8 oz (94.1 kg)  03/02/21 207 lb (93.9 kg)     GENERAL:alert, no distress and comfortable SKIN: skin  color normal, no rashes or significant lesions EYES: normal, Conjunctiva are pink and non-injected, sclera clear  NEURO: alert & oriented x 3 with fluent speech  LABORATORY DATA:  I have reviewed the data as listed CBC Latest Ref Rng & Units 03/30/2021 03/16/2021 02/28/2021  WBC 4.0 - 10.5 K/uL 6.1 6.3 9.3  Hemoglobin 12.0 - 15.0 g/dL 12.2 11.7(L) 12.5  Hematocrit 36.0 - 46.0 % 37.3 36.5 38.6  Platelets 150 - 400 K/uL 184 227 249     CMP Latest Ref Rng & Units 03/30/2021 03/16/2021 02/28/2021  Glucose 70 - 99 mg/dL 87 88 88  BUN 6 - 20 mg/dL 13 12 11   Creatinine 0.44 - 1.00 mg/dL 1.02(H) 0.81 0.89  Sodium 135 - 145 mmol/L 141 141 143  Potassium 3.5 - 5.1 mmol/L 4.4 3.7 3.8  Chloride 98 - 111 mmol/L 108 107 108  CO2 22 - 32 mmol/L 25 26 26   Calcium 8.9 - 10.3 mg/dL 9.7 9.1 9.7  Total Protein 6.5 - 8.1 g/dL 6.9 7.0 7.2  Total Bilirubin 0.3 - 1.2 mg/dL 0.4 0.6 0.9  Alkaline Phos 38 - 126 U/L 59 60 67  AST 15 - 41 U/L 20 27 18   ALT 0 - 44 U/L 26 34 22      RADIOGRAPHIC STUDIES: I have personally reviewed the radiological images as listed and agreed with the findings in the report. No results found.   ASSESSMENT & PLAN:  Robin [name] is a 55 y.o. female with   1. Adenocarcinoma of the pancreas diagnosed in June 2022, cT2N0M1 with properble liver met  -Her PET scan showed a hypermetabolic liver lesion in the right lobe, adjacent to right kidney, suspicious for oligo liver metastasis. No other definitive distant mets. This was not visible on ultrasound, I reviewed the ultrasound findings with her today.  Liver biopsy is not feasible   -She began C1 FOLFIRINOX 03/02/21. Her insurance denied prophylactic G-CSF. -She has been tolerating chemotherapy overall well, with some fatigue, mild diarrhea.  -She has begun to develop early neuropathy.  She has decreased sensation on both wrist today.  We will hold oxaliplatin with C3 today.  If her neuropathy improves, we will add a low-dose oxaliplatin back. -plan for restaging CT scan in 3-4 weeks  2. 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 -She endorses starting a vit B supplement. -decreased sensation in hands, more so on right -Hold oxaliplatin today    3. Right upper quadrant/epigastric abdominal pain -Patient currently being treated with oxycodone 1-2 tablets per day and tylenol if needed.   4. Family history for cancer  -Maternal great-grandmother, grandmother, and 2 sisters with breast cancer -The patient has 1 biologic daughter -The patient is not interested in referral to genetic counseling at this time but will call us if she changes her mind   5. 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   Orders Placed This  Encounter  Procedures   CT Abdomen Pelvis W Contrast  Pancreatic protocol    Standing Status:   Future    Standing Expiration Date:   03/30/2022    Order Specific Question:   If indicated for the ordered procedure, I authorize the administration of contrast media per Radiology protocol    Answer:   Yes    Order Specific Question:   Is patient pregnant?    Answer:   No    Order Specific Question:   Preferred imaging location?    Answer:   [facility]    Order Specific Question:   Release to patient    Answer:   Immediate    Order Specific Question:   Is Oral Contrast requested for this exam?    Answer:   Yes, Per Radiology protocol    All questions were answered. The patient knows to call the clinic with any problems, questions or concerns. No barriers to learning was detected. The total time spent in the appointment was 30 minutes.     Truitt Merle, MD 03/30/2021   I, Wilburn Mylar, am acting as scribe for Truitt Merle, MD.   I have reviewed the above documentation for accuracy and completeness, and I agree with the above.

## 2021-03-30 NOTE — Patient Instructions (Signed)
[facility]  Discharge Instructions: Thank you for choosing [facility] to provide your oncology and hematology care.   If you have a lab appointment with the [facility], please go directly to the [facility] and check in at the registration area.   Wear comfortable clothing and clothing appropriate for easy access to any Portacath or PICC line.   We strive to give you quality time with your provider. You may need to reschedule your appointment if you arrive late (15 or more minutes).  Arriving late affects you and other patients whose appointments are after yours.  Also, if you miss three or more appointments without notifying the office, you may be dismissed from the clinic at the provider's discretion.      For prescription refill requests, have your pharmacy contact our office and allow 72 hours for refills to be completed.    Today you received the following chemotherapy and/or immunotherapy agents: irinotecan/leucovorin/5FU.      To help prevent nausea and vomiting after your treatment, we encourage you to take your nausea medication as directed.  BELOW ARE SYMPTOMS THAT SHOULD BE REPORTED IMMEDIATELY: *FEVER GREATER THAN 100.4 F (38 C) OR HIGHER *CHILLS OR SWEATING *NAUSEA AND VOMITING THAT IS NOT CONTROLLED WITH YOUR NAUSEA MEDICATION *UNUSUAL SHORTNESS OF BREATH *UNUSUAL BRUISING OR BLEEDING *URINARY PROBLEMS (pain or burning when urinating, or frequent urination) *BOWEL PROBLEMS (unusual diarrhea, constipation, pain near the anus) TENDERNESS IN MOUTH AND THROAT WITH OR WITHOUT PRESENCE OF ULCERS (sore throat, sores in mouth, or a toothache) UNUSUAL RASH, SWELLING OR PAIN  UNUSUAL VAGINAL DISCHARGE OR ITCHING   Items with * indicate a potential emergency and should be followed up as soon as possible or go to the Emergency Department if any problems should occur.  Please show the CHEMOTHERAPY ALERT CARD or IMMUNOTHERAPY ALERT  CARD at check-in to the Emergency Department and triage nurse.  Should you have questions after your visit or need to cancel or reschedule your appointment, please contact [facility]  Dept: [phone]  and follow the prompts.  Office hours are 8:00 a.m. to 4:30 p.m. Monday - Friday. Please note that voicemails left after 4:00 p.m. may not be returned until the following business day.  We are closed weekends and major holidays. You have access to a nurse at all times for urgent questions. Please call the main number to the clinic Dept: [phone] and follow the prompts.   For any non-urgent questions, you may also contact your provider using MyChart. We now offer e-Visits for anyone 45 and older to request care online for non-urgent symptoms. For details visit [email or web address].   Also download the MyChart app! Go to the app store, search "MyChart", open the app, select [facility], and log in with your MyChart username and password.  Due to Covid, a mask is required upon entering the hospital/clinic. If you do not have a mask, one will be given to you upon arrival. For doctor visits, patients may have 1 support person aged 44 or older with them. For treatment visits, patients cannot have anyone with them due to current Covid guidelines and our immunocompromised population.

## 2021-04-01 ENCOUNTER — Inpatient Hospital Stay: Payer: 59

## 2021-04-01 ENCOUNTER — Other Ambulatory Visit: Payer: Self-pay

## 2021-04-01 VITALS — BP 114/45 | HR 95 | Temp 98.6°F | Resp 18

## 2021-04-01 DIAGNOSIS — C250 Malignant neoplasm of head of pancreas: Secondary | ICD-10-CM

## 2021-04-01 DIAGNOSIS — Z5111 Encounter for antineoplastic chemotherapy: Secondary | ICD-10-CM | POA: Diagnosis not present

## 2021-04-01 MED ORDER — SODIUM CHLORIDE 0.9% FLUSH
10.0000 mL | INTRAVENOUS | Status: DC | PRN
  Administered 2021-04-01: 10 mL
  Filled 2021-04-01: qty 10

## 2021-04-01 MED ORDER — HEPARIN SOD (PORK) LOCK FLUSH 100 UNIT/ML IV SOLN
500.0000 [IU] | Freq: Once | INTRAVENOUS | Status: AC | PRN
  Administered 2021-04-01: 500 [IU]
  Filled 2021-04-01: qty 5

## 2021-04-04 ENCOUNTER — Other Ambulatory Visit: Payer: Self-pay | Admitting: Hematology

## 2021-04-05 ENCOUNTER — Inpatient Hospital Stay (HOSPITAL_COMMUNITY): Payer: 59

## 2021-04-05 ENCOUNTER — Inpatient Hospital Stay: Payer: 59

## 2021-04-05 ENCOUNTER — Other Ambulatory Visit: Payer: Self-pay

## 2021-04-05 ENCOUNTER — Other Ambulatory Visit: Payer: Self-pay | Admitting: *Deleted

## 2021-04-05 ENCOUNTER — Telehealth: Payer: Self-pay

## 2021-04-05 ENCOUNTER — Encounter: Payer: Self-pay | Admitting: Hematology

## 2021-04-05 ENCOUNTER — Inpatient Hospital Stay (HOSPITAL_BASED_OUTPATIENT_CLINIC_OR_DEPARTMENT_OTHER): Payer: 59 | Admitting: Hematology

## 2021-04-05 ENCOUNTER — Inpatient Hospital Stay (HOSPITAL_COMMUNITY)
Admission: AD | Admit: 2021-04-05 | Discharge: 2021-04-07 | DRG: 439 | Disposition: A | Discharge status: Home / Self Care | Payer: 59 | Source: Ambulatory Visit | Attending: Internal Medicine | Admitting: Internal Medicine

## 2021-04-05 DIAGNOSIS — F319 Bipolar disorder, unspecified: Secondary | ICD-10-CM | POA: Diagnosis present

## 2021-04-05 DIAGNOSIS — Z20822 Contact with and (suspected) exposure to covid-19: Secondary | ICD-10-CM | POA: Diagnosis present

## 2021-04-05 DIAGNOSIS — Z833 Family history of diabetes mellitus: Secondary | ICD-10-CM | POA: Diagnosis not present

## 2021-04-05 DIAGNOSIS — Z818 Family history of other mental and behavioral disorders: Secondary | ICD-10-CM | POA: Diagnosis not present

## 2021-04-05 DIAGNOSIS — C787 Secondary malignant neoplasm of liver and intrahepatic bile duct: Secondary | ICD-10-CM | POA: Diagnosis present

## 2021-04-05 DIAGNOSIS — C250 Malignant neoplasm of head of pancreas: Secondary | ICD-10-CM

## 2021-04-05 DIAGNOSIS — F32A Depression, unspecified: Secondary | ICD-10-CM | POA: Diagnosis present

## 2021-04-05 DIAGNOSIS — Z96651 Presence of right artificial knee joint: Secondary | ICD-10-CM | POA: Diagnosis present

## 2021-04-05 DIAGNOSIS — Z79899 Other long term (current) drug therapy: Secondary | ICD-10-CM | POA: Diagnosis not present

## 2021-04-05 DIAGNOSIS — C259 Malignant neoplasm of pancreas, unspecified: Secondary | ICD-10-CM | POA: Diagnosis present

## 2021-04-05 DIAGNOSIS — T451X5A Adverse effect of antineoplastic and immunosuppressive drugs, initial encounter: Secondary | ICD-10-CM | POA: Diagnosis present

## 2021-04-05 DIAGNOSIS — Z95828 Presence of other vascular implants and grafts: Secondary | ICD-10-CM

## 2021-04-05 DIAGNOSIS — Z880 Allergy status to penicillin: Secondary | ICD-10-CM | POA: Diagnosis not present

## 2021-04-05 DIAGNOSIS — Z882 Allergy status to sulfonamides status: Secondary | ICD-10-CM

## 2021-04-05 DIAGNOSIS — Z91030 Bee allergy status: Secondary | ICD-10-CM | POA: Diagnosis not present

## 2021-04-05 DIAGNOSIS — Z886 Allergy status to analgesic agent status: Secondary | ICD-10-CM

## 2021-04-05 DIAGNOSIS — K859 Acute pancreatitis without necrosis or infection, unspecified: Secondary | ICD-10-CM | POA: Diagnosis present

## 2021-04-05 DIAGNOSIS — Z881 Allergy status to other antibiotic agents status: Secondary | ICD-10-CM

## 2021-04-05 DIAGNOSIS — Z83438 Family history of other disorder of lipoprotein metabolism and other lipidemia: Secondary | ICD-10-CM

## 2021-04-05 DIAGNOSIS — Z90710 Acquired absence of both cervix and uterus: Secondary | ICD-10-CM

## 2021-04-05 DIAGNOSIS — Z8249 Family history of ischemic heart disease and other diseases of the circulatory system: Secondary | ICD-10-CM | POA: Diagnosis not present

## 2021-04-05 DIAGNOSIS — R109 Unspecified abdominal pain: Secondary | ICD-10-CM | POA: Diagnosis present

## 2021-04-05 DIAGNOSIS — D6481 Anemia due to antineoplastic chemotherapy: Secondary | ICD-10-CM | POA: Diagnosis present

## 2021-04-05 DIAGNOSIS — R1010 Upper abdominal pain, unspecified: Secondary | ICD-10-CM | POA: Diagnosis not present

## 2021-04-05 DIAGNOSIS — Z8507 Personal history of malignant neoplasm of pancreas: Secondary | ICD-10-CM

## 2021-04-05 DIAGNOSIS — E785 Hyperlipidemia, unspecified: Secondary | ICD-10-CM | POA: Diagnosis present

## 2021-04-05 DIAGNOSIS — Z803 Family history of malignant neoplasm of breast: Secondary | ICD-10-CM | POA: Diagnosis not present

## 2021-04-05 DIAGNOSIS — G4730 Sleep apnea, unspecified: Secondary | ICD-10-CM | POA: Diagnosis present

## 2021-04-05 LAB — URINALYSIS, ROUTINE W REFLEX MICROSCOPIC
Bilirubin Urine: NEGATIVE
Glucose, UA: NEGATIVE mg/dL
Hgb urine dipstick: NEGATIVE
Ketones, ur: NEGATIVE mg/dL
Nitrite: NEGATIVE
Protein, ur: NEGATIVE mg/dL
Specific Gravity, Urine: 1.009 (ref 1.005–1.030)
pH: 6 (ref 5.0–8.0)

## 2021-04-05 LAB — CMP (CANCER CENTER ONLY)
ALT: 55 U/L — ABNORMAL HIGH (ref 0–44)
AST: 38 U/L (ref 15–41)
Albumin: 3.7 g/dL (ref 3.5–5.0)
Alkaline Phosphatase: 63 U/L (ref 38–126)
Anion gap: 10 (ref 5–15)
BUN: 13 mg/dL (ref 6–20)
CO2: 22 mmol/L (ref 22–32)
Calcium: 9.4 mg/dL (ref 8.9–10.3)
Chloride: 107 mmol/L (ref 98–111)
Creatinine: 0.85 mg/dL (ref 0.44–1.00)
GFR, Estimated: >60 mL/min (ref >60)
Glucose, Bld: 102 mg/dL — ABNORMAL HIGH (ref 70–99)
Potassium: 3.9 mmol/L (ref 3.5–5.1)
Sodium: 139 mmol/L (ref 135–145)
Total Bilirubin: 0.5 mg/dL (ref 0.3–1.2)
Total Protein: 7 g/dL (ref 6.5–8.1)

## 2021-04-05 LAB — CBC WITH DIFFERENTIAL (CANCER CENTER ONLY)
Abs Immature Granulocytes: 0.02 10*3/uL (ref 0.00–0.07)
Basophils Absolute: 0 10*3/uL (ref 0.0–0.1)
Basophils Relative: 0 %
Eosinophils Absolute: 0.1 10*3/uL (ref 0.0–0.5)
Eosinophils Relative: 2 %
HCT: 36.7 % (ref 36.0–46.0)
Hemoglobin: 11.9 g/dL — ABNORMAL LOW (ref 12.0–15.0)
Immature Granulocytes: 0 %
Lymphocytes Relative: 62 %
Lymphs Abs: 3.1 10*3/uL (ref 0.7–4.0)
MCH: 27.7 pg (ref 26.0–34.0)
MCHC: 32.4 g/dL (ref 30.0–36.0)
MCV: 85.3 fL (ref 80.0–100.0)
Monocytes Absolute: 0.3 10*3/uL (ref 0.1–1.0)
Monocytes Relative: 6 %
Neutro Abs: 1.5 10*3/uL — ABNORMAL LOW (ref 1.7–7.7)
Neutrophils Relative %: 30 %
Platelet Count: 172 10*3/uL (ref 150–400)
RBC: 4.3 MIL/uL (ref 3.87–5.11)
RDW: 14.2 % (ref 11.5–15.5)
WBC Count: 5.1 10*3/uL (ref 4.0–10.5)
nRBC: 0 % (ref 0.0–0.2)

## 2021-04-05 LAB — RESP PANEL BY RT-PCR (FLU A&B, COVID) ARPGX2
Influenza A by PCR: NEGATIVE
Influenza B by PCR: NEGATIVE
SARS Coronavirus 2 by RT PCR: NEGATIVE

## 2021-04-05 LAB — LIPASE, BLOOD: Lipase: 155 U/L — ABNORMAL HIGH (ref 11–51)

## 2021-04-05 MED ORDER — ENOXAPARIN SODIUM 40 MG/0.4ML IJ SOSY
40.0000 mg | PREFILLED_SYRINGE | INTRAMUSCULAR | Status: DC
  Administered 2021-04-05 – 2021-04-06 (×2): 40 mg via SUBCUTANEOUS
  Filled 2021-04-05 (×2): qty 0.4

## 2021-04-05 MED ORDER — ONDANSETRON HCL 4 MG/2ML IJ SOLN: 4.0000 mg | Freq: Four times a day (QID) | INTRAMUSCULAR | Status: DC | PRN

## 2021-04-05 MED ORDER — MORPHINE SULFATE (PF) 2 MG/ML IV SOLN: 2.0000 mg | Freq: Once | INTRAVENOUS | Status: AC

## 2021-04-05 MED ORDER — MONTELUKAST SODIUM 10 MG PO TABS
10.0000 mg | ORAL_TABLET | Freq: Every day | ORAL | Status: DC
  Administered 2021-04-06 – 2021-04-07 (×2): 10 mg via ORAL
  Filled 2021-04-05 (×2): qty 1

## 2021-04-05 MED ORDER — ACETAMINOPHEN 650 MG RE SUPP: 650.0000 mg | Freq: Four times a day (QID) | RECTAL | Status: DC | PRN

## 2021-04-05 MED ORDER — ADULT MULTIVITAMIN W/MINERALS CH
1.0000 | ORAL_TABLET | Freq: Every day | ORAL | Status: DC
  Administered 2021-04-05 – 2021-04-07 (×3): 1 via ORAL
  Filled 2021-04-05 (×3): qty 1

## 2021-04-05 MED ORDER — PANTOPRAZOLE SODIUM 40 MG IV SOLR
40.0000 mg | INTRAVENOUS | Status: DC
  Administered 2021-04-05: 40 mg via INTRAVENOUS
  Filled 2021-04-05: qty 40

## 2021-04-05 MED ORDER — MORPHINE SULFATE (PF) 2 MG/ML IV SOLN
2.0000 mg | Freq: Once | INTRAVENOUS | Status: AC
  Administered 2021-04-05: 2 mg via INTRAVENOUS

## 2021-04-05 MED ORDER — MORPHINE SULFATE (PF) 4 MG/ML IV SOLN
4.0000 mg | Freq: Once | INTRAVENOUS | Status: AC
  Administered 2021-04-05: 4 mg via INTRAVENOUS
  Filled 2021-04-05: qty 1

## 2021-04-05 MED ORDER — ONDANSETRON HCL 4 MG PO TABS: 4.0000 mg | ORAL_TABLET | Freq: Four times a day (QID) | ORAL | Status: DC | PRN

## 2021-04-05 MED ORDER — MORPHINE SULFATE (PF) 2 MG/ML IV SOLN
INTRAVENOUS | Status: AC
  Filled 2021-04-05: qty 1

## 2021-04-05 MED ORDER — LACTATED RINGERS IV SOLN: INTRAVENOUS | Status: DC

## 2021-04-05 MED ORDER — HEPARIN SOD (PORK) LOCK FLUSH 100 UNIT/ML IV SOLN
500.0000 [IU] | Freq: Once | INTRAVENOUS | Status: AC
  Administered 2021-04-05: 500 [IU]
  Filled 2021-04-05: qty 5

## 2021-04-05 MED ORDER — OXYCODONE HCL 5 MG PO TABS
5.0000 mg | ORAL_TABLET | ORAL | Status: DC | PRN
  Administered 2021-04-06 – 2021-04-07 (×3): 5 mg via ORAL
  Filled 2021-04-05 (×3): qty 1

## 2021-04-05 MED ORDER — SODIUM CHLORIDE 0.9% FLUSH
10.0000 mL | Freq: Once | INTRAVENOUS | Status: AC
  Administered 2021-04-05: 10 mL
  Filled 2021-04-05: qty 10

## 2021-04-05 MED ORDER — MORPHINE SULFATE (PF) 2 MG/ML IV SOLN
2.0000 mg | INTRAVENOUS | Status: DC | PRN
  Administered 2021-04-05 – 2021-04-06 (×3): 2 mg via INTRAVENOUS
  Filled 2021-04-05 (×4): qty 1

## 2021-04-05 MED ORDER — ACETAMINOPHEN 325 MG PO TABS
650.0000 mg | ORAL_TABLET | Freq: Four times a day (QID) | ORAL | Status: DC | PRN
  Administered 2021-04-06: 650 mg via ORAL
  Filled 2021-04-05: qty 2

## 2021-04-05 MED ORDER — BISACODYL 10 MG RE SUPP: 10.0000 mg | Freq: Every day | RECTAL | Status: DC | PRN

## 2021-04-05 MED ORDER — SODIUM CHLORIDE 0.9 % IV SOLN
INTRAVENOUS | Status: DC
  Filled 2021-04-05 (×2): qty 250

## 2021-04-05 MED ORDER — SODIUM CHLORIDE 0.9% FLUSH: 3.0000 mL | Freq: Two times a day (BID) | INTRAVENOUS | Status: DC

## 2021-04-05 MED ORDER — LURASIDONE HCL 40 MG PO TABS: 40.0000 mg | ORAL_TABLET | Freq: Every day | ORAL | Status: DC

## 2021-04-05 MED ORDER — SENNOSIDES-DOCUSATE SODIUM 8.6-50 MG PO TABS: 1.0000 | ORAL_TABLET | Freq: Every evening | ORAL | Status: DC | PRN

## 2021-04-05 NOTE — Telephone Encounter (Signed)
Patient presented to clinic today in severe pain, complaints of nausea, and dark colored urine.  Patient received IVF while in infusion, patient also received IV Morphine totaling '6mg'$ .  Patient being transported from infusion room to room 1618 on [address]. Report called to Nurse Maudie Mercury.  No further questions or concerns at this time.

## 2021-04-05 NOTE — Progress Notes (Signed)
Patient presented to infusion room for IV fluids and pain management. After initial dose of morphine, patient continued to c/o abdominal pain @ 5/10. [name] notified and came to infusion room to evaluate patient. [name] decided to admit patient for further evaluation and management. Patient aware of plan of care and agrees.

## 2021-04-05 NOTE — H&P (Addendum)
History and Physical    Bana Goel [id number] DOB: Jul 01, 1966 DOA: 04/05/2021  PCP: [name], MD   Patient coming from: Cancer center  Chief complaint: Pain in abdomen  HPI: Robin [name] is a 55 y.o. female with medical history significant for pancreatic cancer with liver mets diagnosed June 2022 on FOLFIRINOX every 2 weeks started on 03/02/2021 followed by [name] who was seen at the cancer center today for abdominal pain.  Patient has been complaining of severe abdominal pain along with nausea and dark-colored urine. He pain started last night starting form left upper abdomen and across the mid and rt upper abdomen and to back, no simila rprior episode, mild nausea no vomiting, last BM yeaterday. Patient otherwise denies any vomiting, chest pain, shortness of breath, fever, headache, focal weakness, numbness tingling, speech difficulties.  She had routine labs done that showed elevated lipase patient was given IV fluid hydration along with IV morphine total 6 mg and admission requested for further management of acute pancreatitis.  Review of Systems: All systems were reviewed and were negative except as mentioned in HPI above. Negative for fever Negative for chest pain Negative for shortness of breath  Past Medical History:  Diagnosis Date   Asthma    Bipolar disorder ([place])    Diverticulosis    Gestational hypertension    HA (headache)    History of borderline personality disorder    Hypersomnia, persistent 04/29/2013   IBS (irritable bowel syndrome)    Memory loss, short term    [name] 04-03-13    Migraine    Pancreatic cancer ([place])    Reflux esophagitis    Seasonal allergies     Past Surgical History:  Procedure Laterality Date   ABDOMINAL HYSTERECTOMY  2000   BILIARY BRUSHING  01/13/2021   Procedure: BILIARY BRUSHING;  Surgeon: Gatha Mayer, MD;  Location: [facility];  Service: Endoscopy;;   BILIARY DILATION  02/09/2021   Procedure: BILIARY  DILATION;  Surgeon: Irving Copas., MD;  Location: [place];  Service: Gastroenterology;;   BILIARY STENT PLACEMENT N/A 01/13/2021   Procedure: BILIARY STENT PLACEMENT;  Surgeon: Gatha Mayer, MD;  Location: [facility];  Service: Endoscopy;  Laterality: N/A;   BILIARY STENT PLACEMENT  02/09/2021   Procedure: BILIARY STENT PLACEMENT;  Surgeon: Irving Copas., MD;  Location: [place];  Service: Gastroenterology;;   BIOPSY  01/13/2021   Procedure: BIOPSY;  Surgeon: Gatha Mayer, MD;  Location: [facility];  Service: Endoscopy;;   BIOPSY  02/09/2021   Procedure: BIOPSY;  Surgeon: Irving Copas., MD;  Location: [facility];  Service: Gastroenterology;;   COLONOSCOPY  01/2020   ERCP N/A 01/13/2021   Procedure: ENDOSCOPIC RETROGRADE CHOLANGIOPANCREATOGRAPHY (ERCP);  Surgeon: Gatha Mayer, MD;  Location: [facility];  Service: Endoscopy;  Laterality: N/A;   ERCP N/A 02/09/2021   Procedure: ENDOSCOPIC RETROGRADE CHOLANGIOPANCREATOGRAPHY (ERCP);  Surgeon: Irving Copas., MD;  Location: [place];  Service: Gastroenterology;  Laterality: N/A;   ESOPHAGOGASTRODUODENOSCOPY (EGD) WITH PROPOFOL N/A 02/09/2021   Procedure: ESOPHAGOGASTRODUODENOSCOPY (EGD) WITH PROPOFOL;  Surgeon: Rush Landmark Telford Nab., MD;  Location: [place];  Service: Gastroenterology;  Laterality: N/A;   EUS N/A 02/09/2021   Procedure: UPPER ENDOSCOPIC ULTRASOUND (EUS) LINEAR;  Surgeon: Irving Copas., MD;  Location: [place];  Service: Gastroenterology;  Laterality: N/A;   FINE NEEDLE ASPIRATION  02/09/2021   Procedure: FINE NEEDLE ASPIRATION (FNA) LINEAR;  Surgeon: Irving Copas., MD;  Location: [place];  Service: Gastroenterology;;  KNEE SURGERY Right 96,98,00,02,05,2014   NASAL SINUS SURGERY     NOSE SURGERY     [id number]   PORTACATH PLACEMENT Right 02/17/2021   Procedure: INSERTION PORT-A-CATH;  Surgeon: Dwan Bolt, MD;  Location: [facility];  Service: General;  Laterality: Right;  LMA ANESTHESIA   REPLACEMENT TOTAL KNEE Right    SPHINCTEROTOMY  01/13/2021   Procedure: SPHINCTEROTOMY;  Surgeon: Gatha Mayer, MD;  Location: [facility];  Service: Endoscopy;;   STENT REMOVAL  02/09/2021   Procedure: STENT REMOVAL;  Surgeon: Irving Copas., MD;  Location: [facility];  Service: Gastroenterology;;   WRIST SURGERY Right [phone]     reports that she has never smoked. She has never used smokeless tobacco. She reports that she does not drink alcohol and does not use drugs.  Allergies  Allergen Reactions   Aspirin Nausea And Vomiting and Rash   Bee Venom Anaphylaxis   Penicillins Anaphylaxis   Bactrim [Sulfamethoxazole-Trimethoprim] Nausea And Vomiting   Sulfa Antibiotics Nausea And Vomiting    Family History  Problem Relation Age of Onset   Depression Mother    High Cholesterol Mother    Hypertension Mother    Heart failure Mother    Hypertension Father    High blood pressure Maternal Grandfather    Heart attack Maternal Grandfather    Breast cancer Maternal Grandmother    Diabetes Maternal Grandmother    High blood pressure Maternal Grandmother    Heart disease Maternal Grandmother    Breast cancer Sister    Schizophrenia Brother    Colon cancer Neg Hx    Esophageal cancer Neg Hx    Rectal cancer Neg Hx    Stomach cancer Neg Hx      Prior to Admission medications   Medication Sig Start Date End Date Taking? Authorizing Provider  azelastine (ASTELIN) 0.1 % nasal spray Place 2 sprays into both nostrils 2 (two) times daily. 07/05/20   [name], MD  clobetasol cream (TEMOVATE) [id number] % Apply 1 application topically 2 (two) times daily. 10/20/19   [name], MD  cyclobenzaprine (FLEXERIL) 5 MG tablet TAKE 1 TABLET(5 MG) BY MOUTH AT BEDTIME AS NEEDED FOR LOWER BACK PAIN OR SEVERE HEADACHE 08/29/20   Narda Amber K, DO  dicyclomine (BENTYL) 10 MG capsule Take 1 capsule (10 mg total) by mouth  in the morning and at bedtime. 03/15/20   Noralyn Pick, NP  diphenoxylate-atropine (LOMOTIL) 2.5-0.025 MG tablet Take 1-2 tabs by mouth 4 times a day as needed for loose stools. 03/10/21   Truitt Merle, MD  docusate sodium (COLACE) 100 MG capsule Take 100 mg by mouth 2 (two) times daily.    [provider]  EPINEPHrine 0.3 mg/0.3 mL IJ SOAJ injection Inject 0.3 mg into the muscle as needed for anaphylaxis. INJECT 0.3 MLS (0.3 MG TOTAL) INTO THE MUSCLE ONCE 12/14/20   [name], MD  fluticasone [facility]) 50 MCG/ACT nasal spray Place 1 spray into both nostrils 2 (two) times daily.    [provider]  gabapentin (NEURONTIN) 300 MG capsule Take 300 mg by mouth daily as needed (pain). 12/13/20   [provider]  LATUDA 40 MG TABS tablet Take 1 tablet (40 mg total) by mouth daily with breakfast. 10/10/17   Dara Hoyer, PA-C  lidocaine-prilocaine (EMLA) cream Apply 1 application topically as needed. 02/13/21   Heilingoetter, Cassandra L, PA-C  lidocaine-prilocaine (EMLA) cream Apply to affected area once 02/14/21   Truitt Merle, MD  magic mouthwash w/lidocaine SOLN Take 10 mLs by mouth 4 (four) times daily as needed for mouth pain. 10 mls swish and spit by mouth four times daily as needed 03/16/21   Truitt Merle, MD  montelukast (SINGULAIR) 10 MG tablet Take 10 mg by mouth daily.    [provider]  Multiple Vitamin (MULTIVITAMIN WITH MINERALS) TABS tablet Take 1 tablet by mouth daily. 02/10/21 05/11/21  Dwan Bolt, MD  ondansetron (ZOFRAN) 8 MG tablet Take 1 tablet (8 mg total) by mouth every 8 (eight) hours as needed for nausea or vomiting. Starting on day 3 after chemotherapy 02/13/21   Heilingoetter, Cassandra L, PA-C  ondansetron (ZOFRAN) 8 MG tablet Take 1 tablet (8 mg total) by mouth 2 (two) times daily as needed. Start on day 3 after chemotherapy. 02/14/21   Truitt Merle, MD  oxyCODONE (OXY IR/ROXICODONE) 5 MG immediate release tablet Take 1 tablet (5 mg total)  by mouth every 4 (four) hours as needed for severe pain. 03/03/21   Truitt Merle, MD  prochlorperazine (COMPAZINE) 10 MG tablet Take 1 tablet (10 mg total) by mouth every 6 (six) hours as needed. 02/13/21   Heilingoetter, Cassandra L, PA-C  prochlorperazine (COMPAZINE) 10 MG tablet Take 1 tablet (10 mg total) by mouth every 6 (six) hours as needed (Nausea or vomiting). 02/14/21   Truitt Merle, MD    Physical Exam: Vitals:   04/05/21 1710  BP: 139/72  Pulse: 62  Resp: 16  Temp: 98.5 F (36.9 C)  TempSrc: Oral  SpO2: 100%    General exam: AAO , NAD, weak appearing. HEENT:Oral mucosa moist, Ear/Nose WNL grossly, dentition normal. Respiratory system: bilaterally clear,no wheezing or crackles,no use of accessory muscle Cardiovascular system: S1 & S2 +, No JVD,. Gastrointestinal system: Abdomen soft, mildly tender in mid upper abdomen,ND, BS+ Nervous System:Alert, awake, moving extremities and grossly nonfocal Extremities: No edema, distal peripheral pulses palpable.  Skin: No rashes,no icterus. MSK: Normal muscle bulk,tone, power   Foley Catheter:  Labs on Admission: I have personally reviewed following labs and imaging studies  CBC: Recent Labs  Lab 03/30/21 0932 04/05/21 1154  WBC 6.1 5.1  NEUTROABS 2.7 1.5*  HGB 12.2 11.9*  HCT 37.3 36.7  MCV 84.8 85.3  PLT 184 [id number]   Basic Metabolic Panel: Recent Labs  Lab 03/30/21 0932 04/05/21 1154  NA 141 139  K 4.4 3.9  CL 108 107  CO2 25 22  GLUCOSE 87 102*  BUN 13 13  CREATININE 1.02* 0.85  CALCIUM 9.7 9.4   GFR: Estimated Creatinine Clearance: 91.8 mL/min (by C-G formula based on SCr of 0.85 mg/dL). Liver Function Tests: Recent Labs  Lab 03/30/21 0932 04/05/21 1154  AST 20 38  ALT 26 55*  ALKPHOS 59 63  BILITOT 0.4 0.5  PROT 6.9 7.0  ALBUMIN 3.7 3.7   Recent Labs  Lab 04/05/21 1154  LIPASE 155*   No results for input(s): AMMONIA in the last 168 hours. Coagulation Profile: No results for input(s): INR, PROTIME  in the last 168 hours. Cardiac Enzymes: No results for input(s): CKTOTAL, CKMB, CKMBINDEX, TROPONINI in the last 168 hours. BNP (last 3 results) No results for input(s): PROBNP in the last 8760 hours. HbA1C: No results for input(s): HGBA1C in the last 72 hours. CBG: No results for input(s): GLUCAP in the last 168 hours. Lipid Profile: No results for input(s): CHOL, HDL, LDLCALC, TRIG, CHOLHDL, LDLDIRECT in the last 72 hours. Thyroid Function Tests: No results for input(s): TSH, T4TOTAL, FREET4,  T3FREE, THYROIDAB in the last 72 hours. Anemia Panel: No results for input(s): VITAMINB12, FOLATE, FERRITIN, TIBC, IRON, RETICCTPCT in the last 72 hours. Urine analysis:    Component Value Date/Time   COLORURINE YELLOW 01/25/2021 [place] 01/25/2021 0717   LABSPEC 1.028 01/25/2021 0717   PHURINE 6.0 01/25/2021 0717   GLUCOSEU NEGATIVE 01/25/2021 0717   HGBUR NEGATIVE 01/25/2021 0717   BILIRUBINUR NEGATIVE 01/25/2021 0717   KETONESUR NEGATIVE 01/25/2021 0717   PROTEINUR NEGATIVE 01/25/2021 0717   UROBILINOGEN 1.0 02/07/2015 2317   NITRITE NEGATIVE 01/25/2021 0717   LEUKOCYTESUR TRACE (A) 01/25/2021 0717    Radiological Exams on Admission: No results found.   Assessment/Plan  Pain in the abdomen along with nausea and elevated lipase 155 concerning for acute pancreatitis also had pancreatic cancer.  Pain control with oral and IV opiates. If pain does not improve or if labs trending up consider CT abd pelvis with contrast ( discussed w/ pt and she agrees with plan).  Labs in the morning diet as tolerated.  Pancreatic cancer with liver mets diagnosed June 2022 on FOLFIRINOX every 2 weeks started on 03/02/2021 followed by [name]   Depression/Bipolar I disorder: Resume home meds latuda  She had hypersomnia 10 yrs ago.  There is no height or weight on file to calculate BMI.   Severity of Illness: Admitted to inpatient due to severity of illness DVT prophylaxis:  enoxaparin (LOVENOX) injection 40 mg Start: 04/05/21 1845 SCDs Start: 04/05/21 1749  Code Status:   Code Status: Full Code  Family Communication: Admission, patients condition and plan of care including tests being ordered have been discussed with the patient who indicate understanding and agree with the plan and Code Status.  Consults called: none  Antonieta Pert MD [facility]  If 7PM-7AM, please contact night-coverage [email or web address]  04/05/2021, 5:51 PM

## 2021-04-05 NOTE — Progress Notes (Signed)
Robin [name]   [phone] [phone]   Clinic Follow up Note   Patient Care Team: [name], MD as PCP - General (Family Medicine) Jerline Pain, MD as PCP - Cardiology (Cardiology) End, Harrell Gave, MD as Consulting Physician (Cardiology) Alda Berthold, DO as Consulting Physician (Neurology) Heilingoetter, Tobe Sos, PA-C as Physician Assistant (Physician Assistant) Truitt Merle, MD as Consulting Physician (Oncology) Dwan Bolt, MD as Consulting Physician (General Surgery)  Date of Service:  04/05/2021  CHIEF COMPLAINT: f/u of pancreatic cancer, symptom management  SUMMARY OF ONCOLOGIC HISTORY: Oncology History Overview Note  Cancer Staging Pancreatic cancer [facility]) Staging form: Exocrine Pancreas, AJCC 8th Edition - Clinical stage from 02/09/2021: Stage IV (cT2, cN0, cM1) - Signed by Truitt Merle, MD on 03/16/2021 Total positive nodes: 0    Pancreatic cancer ([place])  01/11/2021 Imaging   CT Abdomen/Pelvis  IMPRESSION: Multiple low-attenuation liver lesions, which cannot be characterized on this unenhanced exam. Abdomen MRI without and with contrast is recommended for further characterization.   Diffuse biliary ductal dilatation, with soft tissue prominence in the region of the pancreatic head. Pancreatic mass cannot be excluded on this unenhanced exam. Recommend abdomen MRI and MRCP without and with contrast for further evaluation.   Colonic diverticulosis, without radiographic evidence of diverticulitis.     01/12/2021 Imaging   MRI ABDOMEN WITHOUT AND WITH CONTRAST (INCLUDING MRCP)  IMPRESSION: Biliary duct distension of both intra and extrahepatic biliary tree with abrupt caliber change but with no visible mass on imaging. Caliber change more abrupt than expected for stricture and the biliary tree was top-normal size at 6 mm in 2017. Endoscopic assessment is suggested to exclude an occult biliary lesion or atypical  stricture.   Lobulated T2 bright lesion in the medial segment of the LEFT hepatic lobe that favors a sclerosed hemangioma. There is some internal septation suggested and the filling pattern is slightly atypical. Consider 3 to six-month follow-up with extended delays following contrast to 7 minutes better assess filling characteristics, differential consideration would include a biliary cystadenoma with septal enhancement.   01/13/2021 Procedure   ERCP under the care of Dr. Carlean Purl  IMPRESSION: 1. Dilation of the common bile duct and main pancreatic duct. 2. Sphincterotomy and placement of a plastic biliary stent.    01/13/2021 Pathology Results   CASE: [id number]   A. COMMON BILE DUCT, STRICTURE, #1, BRUSHING:   FINAL MICROSCOPIC DIAGNOSIS:  - Suspicious for malignancy  - See comment    02/08/2021 Imaging   CT CHEST WITHOUT CONTRAST AND CT OF ABDOMEN AND PELVIS WITH CONTRAST  IMPRESSION: 1. Findings are highly suspicious for a hypovascular mass in the posterior aspect of the head of the pancreas, as detailed above. This appears associated with persistent narrowing of the distal common bile duct and abrupt termination of the pancreatic duct. Common bile duct stent appears appropriately located, but is associated with some persistent dilatation of the common bile duct. 2. 1.2 x 0.7 cm ground-glass attenuation nodule in the right upper lobe. Initial follow-up with CT at 6-12 months is recommended to confirm persistence. If persistent, repeat CT is recommended every 2 years until 5 years of stability has been established. This recommendation follows the consensus statement: Guidelines for Management of Incidental Pulmonary Nodules Detected on CT Images: From the Fleischner Society 2017; Radiology 2017; 284:228-243. 3. Multiple liver lesions, smallest of which likely represent tiny simple cysts. The larger lesion in the left lobe of the liver has imaging characteristics once  again most suggestive of a cavernous hemangioma. 4. Aortic atherosclerosis, in addition to left anterior descending coronary artery disease. Please note that although the presence of coronary artery calcium documents the presence of coronary artery disease, the severity of this disease and any potential stenosis cannot be assessed on this non-gated CT examination. Assessment for potential risk factor modification, dietary therapy or pharmacologic therapy may be warranted, if clinically indicated. 5. Additional incidental findings, as above.    02/09/2021 Procedure   ERCP under the care of Dr. Rush Landmark   IMPRESSION: Limited images during ERCP demonstrates removal plastic biliary stent and placement of a new metallic biliary stent. Please refer to the dictated operative report for full details of intraoperative findings and procedure.   02/09/2021 Pathology Results   CASE: [id number]  FINAL MICROSCOPIC DIAGNOSIS:  A. PANCREAS, HEAD OF LESION, FINE NEEDLE ASPIRATION:  - Malignant cells consistent with adenocarcinoma    02/09/2021 Procedure   EGD Impression: - No gross lesions in esophagus. Z-line regular, 40 cm from the incisors. - Severe gastritis noted. No other gross lesions in the stomach. Biopsied. - Plastic biliary stent in the duodenum. Removed. Revealed a patent biliary sphincterotomy was found. - No gross lesions in the duodenal bulb, in the first portion of the duodenum and in the second portion of the duodenum. EUS Impression: - Hyperechoic material consistent with sludge was visualized endosonographically in the gallbladder. - There was a suggestion of a stricture in the lower third of the main bile duct leading to dilation in the middle third of the main bile duct and in the upper third of the main bile duct with evidence of hyperechoic material consistent with sludge within the common bile duct. - A mass-like region was identified in the pancreatic head where the  CBD narrowed and where the upstream PD dilation was noted. This is not the most confluent of masses however. Cytology results are pending. However, the endosonographic appearance is suggestive of potential pancreatic adenocarcinoma. This was staged T2 N0 Mx by endosonographic criteria. The staging applies if malignancy is confirmed. Fine needle biopsy performed. - No malignant-appearing lymph nodes were visualized in the celiac region (level 20), peripancreatic region and porta hepatis region.   02/09/2021 Cancer Staging   Staging form: Exocrine Pancreas, AJCC 8th Edition - Clinical stage from 02/09/2021: Stage IV (cT2, cN0, cM1) - Signed by Truitt Merle, MD on 03/16/2021  Total positive nodes: 0    02/13/2021 Initial Diagnosis   Pancreatic cancer ([place])   02/28/2021 Imaging   PET  Signs of pancreatic head mass with suspected hepatic metastatic lesion in the RIGHT hepatic lobe as described.   Equivocal uptake in adjacent lymph nodes, potentially reactive in the setting of biliary stent. Note the biliary stent placement is in the mid to distal common bile duct but appears similar to recent ERCP images.   Stable appearance of ground-glass in the RIGHT upper lobe. This does not display increased FDG uptake and would be atypical for metastatic lesion based on appearance and location. Suggest six-month follow-up for further evaluation.   RIGHT thyroid uptake is slightly asymmetric potentially associated with small nodule. Recommend thyroid ultrasound with biopsy of focal abnormality is seen.(Ref: J Am Coll Radiol. 2015 Feb;12(2): 143-50).   03/02/2021 -  Chemotherapy    Patient is on Treatment Plan: PANCREAS MODIFIED FOLFIRINOX Q14D X 4 CYCLES          CURRENT THERAPY:  First line FOLFIRINOX q2weeks, starting 03/02/21  INTERVAL HISTORY:  Robin [name] is here  for a follow up of pancreatic cancer. She was last seen by me on 03/30/21. She presents to the clinic alone. Her wife is present  via speaker phone. She contacted [place] via MyChart this morning regarding increased pain and darkening urine. She reports the pain is across her belly and goes to her back (right side only). She reports the pain has been present previously but at only 1-2/10. She notes it is now at a 5/10, starting overnight. She denies nausea. She reports she has bowel movements once a day, and her BM yesterday started formed then became loose. She notes she has been passing a lot of gas. She notes her hands are not tingling but are becoming discolored.   All other systems were reviewed with the patient and are negative.  MEDICAL HISTORY:  Past Medical History:  Diagnosis Date   Asthma    Bipolar disorder ([place])    Diverticulosis    Gestational hypertension    HA (headache)    History of borderline personality disorder    Hypersomnia, persistent 04/29/2013   IBS (irritable bowel syndrome)    Memory loss, short term    [name] 04-03-13    Migraine    Pancreatic cancer ([place])    Reflux esophagitis    Seasonal allergies     SURGICAL HISTORY: Past Surgical History:  Procedure Laterality Date   ABDOMINAL HYSTERECTOMY  2000   BILIARY BRUSHING  01/13/2021   Procedure: BILIARY BRUSHING;  Surgeon: Gatha Mayer, MD;  Location: [facility];  Service: Endoscopy;;   BILIARY DILATION  02/09/2021   Procedure: BILIARY DILATION;  Surgeon: Irving Copas., MD;  Location: [place];  Service: Gastroenterology;;   BILIARY STENT PLACEMENT N/A 01/13/2021   Procedure: BILIARY STENT PLACEMENT;  Surgeon: Gatha Mayer, MD;  Location: [facility];  Service: Endoscopy;  Laterality: N/A;   BILIARY STENT PLACEMENT  02/09/2021   Procedure: BILIARY STENT PLACEMENT;  Surgeon: Irving Copas., MD;  Location: [place];  Service: Gastroenterology;;   BIOPSY  01/13/2021   Procedure: BIOPSY;  Surgeon: Gatha Mayer, MD;  Location: [facility];  Service: Endoscopy;;   BIOPSY  02/09/2021   Procedure:  BIOPSY;  Surgeon: Irving Copas., MD;  Location[facility];  Service: Gastroenterology;;   COLONOSCOPY  01/2020   ERCP N/A 01/13/2021   Procedure: ENDOSCOPIC RETROGRADE CHOLANGIOPANCREATOGRAPHY (ERCP);  Surgeon: Gatha Mayer, MD;  Location: [facility];  Service: Endoscopy;  Laterality: N/A;   ERCP N/A 02/09/2021   Procedure: ENDOSCOPIC RETROGRADE CHOLANGIOPANCREATOGRAPHY (ERCP);  Surgeon: Irving Copas., MD;  Location: [place];  Service: Gastroenterology;  Laterality: N/A;   ESOPHAGOGASTRODUODENOSCOPY (EGD) WITH PROPOFOL N/A 02/09/2021   Procedure: ESOPHAGOGASTRODUODENOSCOPY (EGD) WITH PROPOFOL;  Surgeon: Rush Landmark Telford Nab., MD;  Location: [place];  Service: Gastroenterology;  Laterality: N/A;   EUS N/A 02/09/2021   Procedure: UPPER ENDOSCOPIC ULTRASOUND (EUS) LINEAR;  Surgeon: Irving Copas., MD;  Location: [place];  Service: Gastroenterology;  Laterality: N/A;   FINE NEEDLE ASPIRATION  02/09/2021   Procedure: FINE NEEDLE ASPIRATION (FNA) LINEAR;  Surgeon: Irving Copas., MD;  Location: [place];  Service: Gastroenterology;;   KNEE SURGERY Right 96,98,00,02,05,2014   NASAL SINUS SURGERY     NOSE SURGERY     [phone]   PORTACATH PLACEMENT Right 02/17/2021   Procedure: INSERTION PORT-A-CATH;  Surgeon: Dwan Bolt, MD;  Location: [facility];  Service: General;  Laterality: Right;  LMA ANESTHESIA   REPLACEMENT TOTAL KNEE Right    SPHINCTEROTOMY  01/13/2021   Procedure: SPHINCTEROTOMY;  Surgeon: Gatha Mayer, MD;  Location: [facility];  Service: Endoscopy;;   STENT REMOVAL  02/09/2021   Procedure: Lavell Islam REMOVAL;  Surgeon: Irving Copas., MD;  Location: [place];  Service: Gastroenterology;;   WRIST SURGERY Right [phone]    I have reviewed the social history and family history with the patient and they are unchanged from previous note.  ALLERGIES:  is allergic to aspirin, bee venom, penicillins, bactrim  [sulfamethoxazole-trimethoprim], and sulfa antibiotics.  MEDICATIONS:  Current Facility-Administered Medications  Medication Dose Route Frequency Provider Last Rate Last Admin   0.9 %  sodium chloride infusion   Intravenous Continuous Truitt Merle, MD 250 mL/hr at 04/05/21 1554 Infusion Verify at 04/05/21 1554   No current outpatient medications on file.    PHYSICAL EXAMINATION: ECOG PERFORMANCE STATUS: 3 - Symptomatic, >50% confined to bed  There were no vitals filed for this visit. Wt Readings from Last 3 Encounters:  03/30/21 205 lb 12.8 oz (93.4 kg)  03/16/21 207 lb 8 oz (94.1 kg)  03/02/21 207 lb (93.9 kg)     GENERAL:alert, no distress and comfortable SKIN: skin color, texture, turgor are normal, no rashes or significant lesions EYES: normal, Conjunctiva are pink and non-injected, sclera clear  NECK: supple, thyroid normal size, non-tender, without nodularity LYMPH:  no palpable lymphadenopathy in the cervical, axillary  LUNGS: clear to auscultation and percussion with normal breathing effort HEART: regular rate & rhythm and no murmurs and no lower extremity edema ABDOMEN:abdomen soft, non-tender; (+) increased bowel sounds Musculoskeletal:no cyanosis of digits and no clubbing  NEURO: alert & oriented x 3 with fluent speech, no focal motor/sensory deficits  LABORATORY DATA:  I have reviewed the data as listed CBC Latest Ref Rng & Units 04/05/2021 03/30/2021 03/16/2021  WBC 4.0 - 10.5 K/uL 5.1 6.1 6.3  Hemoglobin 12.0 - 15.0 g/dL 11.9(L) 12.2 11.7(L)  Hematocrit 36.0 - 46.0 % 36.7 37.3 36.5  Platelets 150 - 400 K/uL 172 184 227     CMP Latest Ref Rng & Units 04/05/2021 03/30/2021 03/16/2021  Glucose 70 - 99 mg/dL 102(H) 87 88  BUN 6 - 20 mg/dL _0 Creatinine 0.44 - 1.00 mg/dL 0.85 1.02(H) 0.81  Sodium 135 - 145 mmol/L 139 141 141  Potassium 3.5 - 5.1 mmol/L 3.9 4.4 3.7  Chloride 98 - 111 mmol/L 107 108 107  CO2 22 - 32 mmol/L _1 Calcium 8.9 - 10.3 mg/dL 9.4 9.7  9.1  Total Protein 6.5 - 8.1 g/dL 7.0 6.9 7.0  Total Bilirubin 0.3 - 1.2 mg/dL 0.5 0.4 0.6  Alkaline Phos 38 - 126 U/L 63 59 60  AST 15 - 41 U/L 38 20 27  ALT 0 - 44 U/L 55(H) 26 34      RADIOGRAPHIC STUDIES: I have personally reviewed the radiological images as listed and agreed with the findings in the report. No results found.   ASSESSMENT & PLAN:  Robin [name] is a 55 y.o. female with   1. Right upper quadrant/epigastric abdominal pain, acute pancreatitis?  -she has mild pain from her pancreatic cancer which currently being treated with oxycodone 1-2 tablets per day and tylenol if needed. -Acute moderate to severe pain increased from a 1-2/10 to 5-6/10 last night (04/05/21), no fever or nausea  -I will add lipase lab onto her blood work from today. -I ordered IVF and low-dose morphine for her today.   2. Adenocarcinoma of the pancreas diagnosed in June  2022, [id number] with properble liver met  -Her PET scan showed a hypermetabolic liver lesion in the right lobe, adjacent to right kidney, suspicious for oligo liver metastasis. No other definitive distant mets. This was not visible on ultrasound, I reviewed the ultrasound findings with her today.  Liver biopsy is not feasible   -She began C1 FOLFIRINOX 03/02/21. Her insurance denied prophylactic G-CSF. -She has been tolerating chemotherapy overall well, with some fatigue, mild diarrhea.  -She has begun to develop early neuropathy.  She has decreased sensation on both wrist today.  We held oxaliplatin with C3.  If her neuropathy improves, we will add a low-dose oxaliplatin back. -plan for restaging CT scan in 3-4 weeks   3. 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 -She endorses starting a vit B supplement. -decreased sensation in hands, more so on right -Hold oxaliplatin today    4. Family history for cancer  -Maternal great-grandmother, grandmother, and 2 sisters with breast cancer -The patient has 1 biologic daughter -The  patient is not interested in referral to genetic counseling at this time but will call us if she changes her mind   5. 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  Patient agrees.  I called [facility] hospitalist, Dr. KcLupita Leashas kindly accepted her  -I will follow-up her when she is in the hospital.   No problem-specific Assessment & Plan notes found for this encounter.   Orders Placed This Encounter  Procedures   Lipase, blood    Add to CMP today    Standing Status:   Future    Number of Occurrences:   1    Standing Expiration Date:   04/05/2022   All questions were answered. The patient knows to call the clinic with any problems, questions or concerns. No barriers to learning was detected. The total time spent in the appointment was 30 minutes.     YaTruitt MerleMD 04/05/2021   I, KaWilburn Mylaram acting as scribe for YaTruitt MerleMD.   I have reviewed the above documentation for accuracy and completeness, and I agree with the above.

## 2021-04-06 DIAGNOSIS — R1010 Upper abdominal pain, unspecified: Secondary | ICD-10-CM | POA: Diagnosis not present

## 2021-04-06 DIAGNOSIS — C250 Malignant neoplasm of head of pancreas: Secondary | ICD-10-CM

## 2021-04-06 LAB — CBC
HCT: 34 % — ABNORMAL LOW (ref 36.0–46.0)
Hemoglobin: 10.8 g/dL — ABNORMAL LOW (ref 12.0–15.0)
MCH: 27.7 pg (ref 26.0–34.0)
MCHC: 31.8 g/dL (ref 30.0–36.0)
MCV: 87.2 fL (ref 80.0–100.0)
Platelets: 153 10*3/uL (ref 150–400)
RBC: 3.9 MIL/uL (ref 3.87–5.11)
RDW: 14.2 % (ref 11.5–15.5)
WBC: 4.3 10*3/uL (ref 4.0–10.5)
nRBC: 0 % (ref 0.0–0.2)

## 2021-04-06 LAB — COMPREHENSIVE METABOLIC PANEL
ALT: 43 U/L (ref 0–44)
AST: 30 U/L (ref 15–41)
Albumin: 3.2 g/dL — ABNORMAL LOW (ref 3.5–5.0)
Alkaline Phosphatase: 49 U/L (ref 38–126)
Anion gap: 7 (ref 5–15)
BUN: 9 mg/dL (ref 6–20)
CO2: 26 mmol/L (ref 22–32)
Calcium: 8.9 mg/dL (ref 8.9–10.3)
Chloride: 106 mmol/L (ref 98–111)
Creatinine, Ser: 0.86 mg/dL (ref 0.44–1.00)
GFR, Estimated: >60 mL/min (ref >60)
Glucose, Bld: 109 mg/dL — ABNORMAL HIGH (ref 70–99)
Potassium: 3.8 mmol/L (ref 3.5–5.1)
Sodium: 139 mmol/L (ref 135–145)
Total Bilirubin: 0.7 mg/dL (ref 0.3–1.2)
Total Protein: 6 g/dL — ABNORMAL LOW (ref 6.5–8.1)

## 2021-04-06 LAB — LIPASE, BLOOD: Lipase: 35 U/L (ref 11–51)

## 2021-04-06 MED ORDER — PANTOPRAZOLE SODIUM 40 MG PO TBEC
40.0000 mg | DELAYED_RELEASE_TABLET | Freq: Every day | ORAL | Status: DC
  Administered 2021-04-06 – 2021-04-07 (×2): 40 mg via ORAL
  Filled 2021-04-06 (×2): qty 1

## 2021-04-06 MED ORDER — CHLORHEXIDINE GLUCONATE CLOTH 2 % EX PADS
6.0000 | MEDICATED_PAD | Freq: Every day | CUTANEOUS | Status: DC
  Administered 2021-04-06: 6 via TOPICAL

## 2021-04-06 MED ORDER — ALBUTEROL SULFATE (2.5 MG/3ML) 0.083% IN NEBU: 3.0000 mL | INHALATION_SOLUTION | Freq: Four times a day (QID) | RESPIRATORY_TRACT | Status: DC | PRN

## 2021-04-06 NOTE — Progress Notes (Signed)
The patient is receiving Protonix by the intravenous route.  Based on criteria approved by the Pharmacy and [place], the medication is being converted to the equivalent oral dose form.  These criteria include: -No active GI bleeding -Able to tolerate diet of full liquids (or better) or tube feeding -Able to tolerate other medications by the oral or enteral route  If you have any questions about this conversion, please contact the Pharmacy Department (phone [phone]).  Thank you.   Minda Ditto PharmD [facility] [phone] 04/06/2021, 1:23 PM

## 2021-04-06 NOTE — Progress Notes (Signed)
PROGRESS NOTE    Robin [name]  [id number] DOB: Mar 06, 1966 DOA: 04/05/2021 PCP: [name], MD  Chief complaint Abdominal pain  Brief Narrative:  55 y.o. female with medical history significant for pancreatic cancer with liver mets diagnosed June 2022 on FOLFIRINOX every 2 weeks started on 03/02/2021 followed by [name] who was seen at the cancer center today for abdominal pain.  Patient has been complaining of severe abdominal pain along with nausea and dark-colored urine. He pain started last night starting form left upper abdomen and across the mid and rt upper abdomen and to back, no simila rprior episode, mild nausea no vomiting, last BM yeaterday. Patient otherwise denies any vomiting, chest pain, shortness of breath, fever, headache, focal weakness, numbness tingling, speech difficulties.  She had routine labs done that showed elevated lipase patient was given IV fluid hydration along with IV morphine total 6 mg and admission requested for further management of acute pancreatitis.  Subjective: Seen this morning.  Patient complains of abdominal pain ongoing needing IV morphine.  Assessment & Plan:  Pain in the abdomen suspecting acute pancreatitis Pain also contributed by pancreatic cancer Lipase is normal now.  Will advance diet to full liquid diet hopefully to soft diet for dinner.  Continue on aggressive IV fluid hydration.  Continue IV morphine and oral opiates for pain control.  Ultrasound abdomen revealed 8/10 shows benign hepatic lesions on ultrasound suspected mets on recent PET is not evident, indwelling common bile duct stent present and duct measures 8 mm.  Pancreatic cancer with liver mets diagnosed June 2022 on FOLFIRINOX every 2 weeks started on 03/02/2021 followed by [name].    Depression/Bipolar I disorder: Mood currently stable.   History of hypersomnia.  Diet Order             Diet full liquid Room service appropriate? Yes; Fluid consistency: Thin  Diet  effective now                  Patient's There is no height or weight on file to calculate BMI.  DVT prophylaxis: enoxaparin (LOVENOX) injection 40 mg Start: 04/05/21 1845 SCDs Start: 04/05/21 1749 Code Status:   Code Status: Full Code  Family Communication: plan of care discussed with patient at bedside. Status is: Inpatient  Remains inpatient appropriate because:IV treatments appropriate due to intensity of illness or inability to take PO and Inpatient level of care appropriate due to severity of illness  Dispo: The patient is from: Home              Anticipated d/c is to: Home              Patient currently is not medically stable to d/c.   Difficult to place patient No Unresulted Labs (From admission, onward)     Start     Ordered   04/12/21 0500  Creatinine, serum  (enoxaparin (LOVENOX)    CrCl >/= 30 ml/min)  Weekly,   R     Comments: while on enoxaparin therapy    04/05/21 1749   04/06/21 1311  Gastrointestinal Panel by PCR , Stool  (Gastrointestinal Panel by PCR, Stool                                                                                                                                                     **  Does Not include CLOSTRIDIUM DIFFICILE testing. **If CDIFF testing is needed, place order from the "C Difficile Testing" order set.**)  Once,   R        04/06/21 1310   04/06/21 1311  C Difficile Quick Screen w PCR reflex  (C Difficile quick screen w PCR reflex panel )  Once, for 24 hours,   TIMED       References:    CDiff Information Tool   04/06/21 1310   04/06/21 0500  Lipase, blood  Daily,   R      04/05/21 1749           Medications reviewed:  Scheduled Meds:  Chlorhexidine Gluconate Cloth  6 each Topical Daily   enoxaparin (LOVENOX) injection  40 mg Subcutaneous Q24H   montelukast  10 mg Oral Daily   multivitamin with minerals  1 tablet Oral Daily   pantoprazole  40 mg Oral Daily   sodium chloride flush  3 mL Intravenous Q12H   Continuous  Infusions:  lactated ringers 100 mL/hr at 04/06/21 0955   Consultants:see note  Procedures:see note Antimicrobials: Anti-infectives (From admission, onward)    None      Culture/Microbiology No results found for: SDES, SPECREQUEST, CULT, REPTSTATUS  Other culture-see note  Objective: Vitals: Today's Vitals   04/06/21 0717 04/06/21 0746 04/06/21 1008 04/06/21 1318  BP:   120/72   Pulse:   75   Resp:   17   Temp:   99.1 F (37.3 C)   TempSrc:   Oral   SpO2:   100%   PainSc: '6  2   6     '$ Intake/Output Summary (Last 24 hours) at 04/06/2021 1352 Last data filed at 04/06/2021 1200 Gross per 24 hour  Intake 1604.4 ml  Output 2700 ml  Net -1095.6 ml   There were no vitals filed for this visit. Weight change:   Intake/Output from previous day: 08/10 0701 - 08/11 0700 In: 1484.4 [I.V.:1484.4] Out: 900 [Urine:900] Intake/Output this shift: Total I/O In: 120 [P.O.:120] Out: 1800 [Urine:1800] There were no vitals filed for this visit. Examination: General exam: AAO 3 , older than stated age, weak appearing. HEENT:Oral mucosa moist, Ear/Nose WNL grossly,dentition normal. Respiratory system: bilaterally diminished,no use of accessory muscle, non tender. Cardiovascular system: S1 & S2 +,No JVD. Gastrointestinal system: Abdomen soft, tender in upper abdomen , no rigidity,BS+. Nervous System:Alert, awake, moving extremities Extremities: no edema, distal peripheral pulses palpable.  Skin: No rashes,no icterus. MSK: Normal muscle bulk,tone, power Data Reviewed: I have personally reviewed following labs and imaging studies CBC: Recent Labs  Lab 04/05/21 1154 04/06/21 0512  WBC 5.1 4.3  NEUTROABS 1.5*  --   HGB 11.9* 10.8*  HCT 36.7 34.0*  MCV 85.3 87.2  PLT 172 0000000   Basic Metabolic Panel: Recent Labs  Lab 04/05/21 1154 04/06/21 0512  NA 139 139  K 3.9 3.8  CL 107 106  CO2 22 26  GLUCOSE 102* 109*  BUN 13 9  CREATININE 0.85 0.86  CALCIUM 9.4 8.9    GFR: Estimated Creatinine Clearance: 90.8 mL/min (by C-G formula based on SCr of 0.86 mg/dL). Liver Function Tests: Recent Labs  Lab 04/05/21 1154 04/06/21 0512  AST 38 30  ALT 55* 43  ALKPHOS 63 49  BILITOT 0.5 0.7  PROT 7.0 6.0*  ALBUMIN 3.7 3.2*   Recent Labs  Lab 04/05/21 1154 04/06/21 0512  LIPASE 155* 35   No results for input(s): AMMONIA in the last 168 hours.  Coagulation Profile: No results for input(s): INR, PROTIME in the last 168 hours. Cardiac Enzymes: No results for input(s): CKTOTAL, CKMB, CKMBINDEX, TROPONINI in the last 168 hours. BNP (last 3 results) No results for input(s): PROBNP in the last 8760 hours. HbA1C: No results for input(s): HGBA1C in the last 72 hours. CBG: No results for input(s): GLUCAP in the last 168 hours. Lipid Profile: No results for input(s): CHOL, HDL, LDLCALC, TRIG, CHOLHDL, LDLDIRECT in the last 72 hours. Thyroid Function Tests: No results for input(s): TSH, T4TOTAL, FREET4, T3FREE, THYROIDAB in the last 72 hours. Anemia Panel: No results for input(s): VITAMINB12, FOLATE, FERRITIN, TIBC, IRON, RETICCTPCT in the last 72 hours. Sepsis Labs: No results for input(s): PROCALCITON, LATICACIDVEN in the last 168 hours.  Recent Results (from the past 240 hour(s))  Resp Panel by RT-PCR (Flu A&B, Covid) Nasopharyngeal Swab     Status: None   Collection Time: 04/05/21  6:11 PM   Specimen: Nasopharyngeal Swab; Nasopharyngeal(NP) swabs in vial transport medium  Result Value Ref Range Status   SARS Coronavirus 2 by RT PCR NEGATIVE NEGATIVE Final    Comment: (NOTE) SARS-CoV-2 target nucleic acids are NOT DETECTED.  The SARS-CoV-2 RNA is generally detectable in upper respiratory specimens during the acute phase of infection. The lowest concentration of SARS-CoV-2 viral copies this assay can detect is 138 copies/mL. A negative result does not preclude SARS-Cov-2 infection and should not be used as the sole basis for treatment or other  patient management decisions. A negative result may occur with  improper specimen collection/handling, submission of specimen other than nasopharyngeal swab, presence of viral mutation(s) within the areas targeted by this assay, and inadequate number of viral copies(<138 copies/mL). A negative result must be combined with clinical observations, patient history, and epidemiological information. The expected result is Negative.  Fact Sheet for Patients:  EntrepreneurPulse.com.au  Fact Sheet for Healthcare Providers:  IncredibleEmployment.be  This test is no t yet approved or cleared by the [place] FDA and  has been authorized for detection and/or diagnosis of SARS-CoV-2 by FDA under an Emergency Use Authorization (EUA). This EUA will remain  in effect (meaning this test can be used) for the duration of the COVID-19 declaration under Section 564(b)(1) of the Act, 21 U.S.C.section 360bbb-3(b)(1), unless the authorization is terminated  or revoked sooner.       Influenza A by PCR NEGATIVE NEGATIVE Final   Influenza B by PCR NEGATIVE NEGATIVE Final    Comment: (NOTE) The Xpert Xpress SARS-CoV-2/FLU/RSV plus assay is intended as an aid in the diagnosis of influenza from Nasopharyngeal swab specimens and should not be used as a sole basis for treatment. Nasal washings and aspirates are unacceptable for Xpert Xpress SARS-CoV-2/FLU/RSV testing.  Fact Sheet for Patients: EntrepreneurPulse.com.au  Fact Sheet for Healthcare Providers: IncredibleEmployment.be  This test is not yet approved or cleared by the [place] FDA and has been authorized for detection and/or diagnosis of SARS-CoV-2 by FDA under an Emergency Use Authorization (EUA). This EUA will remain in effect (meaning this test can be used) for the duration of the COVID-19 declaration under Section 564(b)(1) of the Act, 21 U.S.C. section  360bbb-3(b)(1), unless the authorization is terminated or revoked.  Performed at [facility], [place] [address] [place], [place] [address]      Radiology Studies: US Abdomen Complete  Result Date: 04/05/2021 CLINICAL DATA:  Abdominal pain, pancreatic cancer with liver metastases, biliary stent placement EXAM: ABDOMEN ULTRASOUND COMPLETE COMPARISON:  PET-CT dated 02/28/2021 FINDINGS: Gallbladder:  Underdistended with secondary mild wall thickening. Common bile duct: Diameter: 8 mm.  Indwelling stent. Liver: 2.7 x 1.9 x 3.0 cm hyperechoic lesion in the right hepatic lobe, likely corresponding to the patient's known benign hemangioma. Additional 8 x 5 x 7 mm cystic lesion in the right hepatic lobe, compatible with a benign cyst. Portal vein is patent on color Doppler imaging with normal direction of blood flow towards the liver. IVC: No abnormality visualized. Pancreas: Visualized portion unremarkable in this patient with known pancreatic cancer. Spleen: Size and appearance within normal limits. Right Kidney: Length: 10.2 cm. Echogenicity within normal limits. No mass or hydronephrosis visualized. Left Kidney: Length: 10.7 cm. Echogenicity within normal limits. No mass or hydronephrosis visualized. Abdominal aorta: No aneurysm visualized. Other findings: None. IMPRESSION: Benign hepatic lesions on ultrasound. Suspected metastasis on recent PET is not evident on ultrasound. Indwelling common duct stent.  Common duct measures 8 mm. Electronically Signed   By: Julian Hy M.D.   On: 04/05/2021 23:32     LOS: 1 day   Antonieta Pert, MD [facility]  04/06/2021, 1:52 PM

## 2021-04-06 NOTE — Progress Notes (Signed)
   04/06/21 0200  Vitals  Temp (!) 100.8 F (38.2 C)  Temp Source Axillary  BP (!) 141/70  BP Location Left Arm  BP Method Automatic  Patient Position (if appropriate) Lying  Pulse Rate (!) 104  Pulse Rate Source Dinamap  Resp 20  Level of Consciousness  Level of Consciousness Alert  MEWS COLOR  MEWS Score Color Yellow  Oxygen Therapy  SpO2 100 %  O2 Device Room Air  MEWS Score  MEWS Temp 1  MEWS Systolic 0  MEWS Pulse 1  MEWS RR 0  MEWS LOC 0  MEWS Score 2  Provider Notification  Provider Name/Title Gershon Cull  Date Provider Notified 04/06/21  Time Provider Notified 0221  Notification Type Page  Notification Reason Change in status (yellow mews, temp and pulse)  Tylenol given for fever. Will recheck temperature. NP notified. Will continue to monitor.

## 2021-04-06 NOTE — Progress Notes (Addendum)
HEMATOLOGY-ONCOLOGY PROGRESS NOTE  SUBJECTIVE: Robin [name] is followed by our office for pancreatic cancer.  She was seen in our office yesterday with having abdominal pain.  Lipase was noted to be elevated.  She was sent to the hospital for admission.  Abdominal ultrasound showed benign hepatic lesions.  When seen today, the patient reports ongoing abdominal pain over her upper abdomen.  She denies having any nausea or vomiting today.  She reports that she had 2 episodes of diarrhea just prior to my visit.  He was also noted to have a fever overnight.  Lipase this morning has normalized.  Oncology History Overview Note  Cancer Staging Pancreatic cancer [facility]) Staging form: Exocrine Pancreas, AJCC 8th Edition - Clinical stage from 02/09/2021: Stage IV (cT2, cN0, cM1) - Signed by Truitt Merle, MD on 03/16/2021 Total positive nodes: 0    Pancreatic cancer ([place])  01/11/2021 Imaging   CT Abdomen/Pelvis  IMPRESSION: Multiple low-attenuation liver lesions, which cannot be characterized on this unenhanced exam. Abdomen MRI without and with contrast is recommended for further characterization.   Diffuse biliary ductal dilatation, with soft tissue prominence in the region of the pancreatic head. Pancreatic mass cannot be excluded on this unenhanced exam. Recommend abdomen MRI and MRCP without and with contrast for further evaluation.   Colonic diverticulosis, without radiographic evidence of diverticulitis.     01/12/2021 Imaging   MRI ABDOMEN WITHOUT AND WITH CONTRAST (INCLUDING MRCP)  IMPRESSION: Biliary duct distension of both intra and extrahepatic biliary tree with abrupt caliber change but with no visible mass on imaging. Caliber change more abrupt than expected for stricture and the biliary tree was top-normal size at 6 mm in 2017. Endoscopic assessment is suggested to exclude an occult biliary lesion or atypical stricture.   Lobulated T2 bright lesion in the medial segment of the LEFT  hepatic lobe that favors a sclerosed hemangioma. There is some internal septation suggested and the filling pattern is slightly atypical. Consider 3 to six-month follow-up with extended delays following contrast to 7 minutes better assess filling characteristics, differential consideration would include a biliary cystadenoma with septal enhancement.   01/13/2021 Procedure   ERCP under the care of Dr. Carlean Purl  IMPRESSION: 1. Dilation of the common bile duct and main pancreatic duct. 2. Sphincterotomy and placement of a plastic biliary stent.    01/13/2021 Pathology Results   CASE: [id number]   A. COMMON BILE DUCT, STRICTURE, #1, BRUSHING:   FINAL MICROSCOPIC DIAGNOSIS:  - Suspicious for malignancy  - See comment    02/08/2021 Imaging   CT CHEST WITHOUT CONTRAST AND CT OF ABDOMEN AND PELVIS WITH CONTRAST  IMPRESSION: 1. Findings are highly suspicious for a hypovascular mass in the posterior aspect of the head of the pancreas, as detailed above. This appears associated with persistent narrowing of the distal common bile duct and abrupt termination of the pancreatic duct. Common bile duct stent appears appropriately located, but is associated with some persistent dilatation of the common bile duct. 2. 1.2 x 0.7 cm ground-glass attenuation nodule in the right upper lobe. Initial follow-up with CT at 6-12 months is recommended to confirm persistence. If persistent, repeat CT is recommended every 2 years until 5 years of stability has been established. This recommendation follows the consensus statement: Guidelines for Management of Incidental Pulmonary Nodules Detected on CT Images: From the Fleischner Society 2017; Radiology 2017; 284:228-243. 3. Multiple liver lesions, smallest of which likely represent tiny simple cysts. The larger lesion in the left lobe of the liver  has imaging characteristics once again most suggestive of a cavernous hemangioma. 4. Aortic atherosclerosis,  in addition to left anterior descending coronary artery disease. Please note that although the presence of coronary artery calcium documents the presence of coronary artery disease, the severity of this disease and any potential stenosis cannot be assessed on this non-gated CT examination. Assessment for potential risk factor modification, dietary therapy or pharmacologic therapy may be warranted, if clinically indicated. 5. Additional incidental findings, as above.    02/09/2021 Procedure   ERCP under the care of Dr. Rush Landmark   IMPRESSION: Limited images during ERCP demonstrates removal plastic biliary stent and placement of a new metallic biliary stent. Please refer to the dictated operative report for full details of intraoperative findings and procedure.   02/09/2021 Pathology Results   CASE: [id number]  FINAL MICROSCOPIC DIAGNOSIS:  A. PANCREAS, HEAD OF LESION, FINE NEEDLE ASPIRATION:  - Malignant cells consistent with adenocarcinoma    02/09/2021 Procedure   EGD Impression: - No gross lesions in esophagus. Z-line regular, 40 cm from the incisors. - Severe gastritis noted. No other gross lesions in the stomach. Biopsied. - Plastic biliary stent in the duodenum. Removed. Revealed a patent biliary sphincterotomy was found. - No gross lesions in the duodenal bulb, in the first portion of the duodenum and in the second portion of the duodenum. EUS Impression: - Hyperechoic material consistent with sludge was visualized endosonographically in the gallbladder. - There was a suggestion of a stricture in the lower third of the main bile duct leading to dilation in the middle third of the main bile duct and in the upper third of the main bile duct with evidence of hyperechoic material consistent with sludge within the common bile duct. - A mass-like region was identified in the pancreatic head where the CBD narrowed and where the upstream PD dilation was noted. This is not the  most confluent of masses however. Cytology results are pending. However, the endosonographic appearance is suggestive of potential pancreatic adenocarcinoma. This was staged T2 N0 Mx by endosonographic criteria. The staging applies if malignancy is confirmed. Fine needle biopsy performed. - No malignant-appearing lymph nodes were visualized in the celiac region (level 20), peripancreatic region and porta hepatis region.   02/09/2021 Cancer Staging   Staging form: Exocrine Pancreas, AJCC 8th Edition - Clinical stage from 02/09/2021: Stage IV (cT2, cN0, cM1) - Signed by Truitt Merle, MD on 03/16/2021 Total positive nodes: 0   02/13/2021 Initial Diagnosis   Pancreatic cancer ([place])   02/28/2021 Imaging   PET  Signs of pancreatic head mass with suspected hepatic metastatic lesion in the RIGHT hepatic lobe as described.   Equivocal uptake in adjacent lymph nodes, potentially reactive in the setting of biliary stent. Note the biliary stent placement is in the mid to distal common bile duct but appears similar to recent ERCP images.   Stable appearance of ground-glass in the RIGHT upper lobe. This does not display increased FDG uptake and would be atypical for metastatic lesion based on appearance and location. Suggest six-month follow-up for further evaluation.   RIGHT thyroid uptake is slightly asymmetric potentially associated with small nodule. Recommend thyroid ultrasound with biopsy of focal abnormality is seen.(Ref: J Am Coll Radiol. 2015 Feb;12(2): 143-50).   03/02/2021 -  Chemotherapy    Patient is on Treatment Plan: PANCREAS MODIFIED FOLFIRINOX Q14D X 4 CYCLES          REVIEW OF SYSTEMS:   Constitutional: The patient is having intermittent fevers Eyes: Denies  blurriness of vision Ears, nose, mouth, throat, and face: Denies mucositis or sore throat Respiratory: Denies cough, dyspnea or wheezes Cardiovascular: Denies palpitation, chest discomfort Gastrointestinal: Reports  abdominal pain and diarrhea Skin: Denies abnormal skin rashes Lymphatics: Denies new lymphadenopathy or easy bruising Neurological:Denies numbness, tingling or new weaknesses Behavioral/Psych: Mood is stable, no new changes  Extremities: No lower extremity edema All other systems were reviewed with the patient and are negative.  I have reviewed the past medical history, past surgical history, social history and family history with the patient and they are unchanged from previous note.   PHYSICAL EXAMINATION: ECOG PERFORMANCE STATUS: 2 - Symptomatic, <50% confined to bed  Vitals:   04/06/21 0405 04/06/21 0546  BP: 124/63 130/66  Pulse: 87 80  Resp: 18 18  Temp: 99.1 F (37.3 C) 98.2 F (36.8 C)  SpO2: 99% 97%   There were no vitals filed for this visit.  Intake/Output from previous day: 08/10 0701 - 08/11 0700 In: 1484.4 [I.V.:1484.4] Out: 900 [Urine:900]  GENERAL: Lying in bed, appears uncomfortable SKIN: skin color, texture, turgor are normal, no rashes or significant lesions EYES: normal, Conjunctiva are pink and non-injected, sclera clear LUNGS: clear to auscultation and percussion with normal breathing effort HEART: regular rate & rhythm and no murmurs and no lower extremity edema ABDOMEN: Positive bowel sounds, soft, tenderness over the upper abdomen Musculoskeletal:no cyanosis of digits and no clubbing  NEURO: alert & oriented x 3 with fluent speech, no focal motor/sensory deficits  LABORATORY DATA:  I have reviewed the data as listed CMP Latest Ref Rng & Units 04/06/2021 04/05/2021 03/30/2021  Glucose 70 - 99 mg/dL 109(H) 102(H) 87  BUN 6 - 20 mg/dL '9 13 13  '$ Creatinine 0.44 - 1.00 mg/dL 0.86 0.85 1.02(H)  Sodium 135 - 145 mmol/L 139 139 141  Potassium 3.5 - 5.1 mmol/L 3.8 3.9 4.4  Chloride 98 - 111 mmol/L 106 107 108  CO2 22 - 32 mmol/L '26 22 25  '$ Calcium 8.9 - 10.3 mg/dL 8.9 9.4 9.7  Total Protein 6.5 - 8.1 g/dL 6.0(L) 7.0 6.9  Total Bilirubin 0.3 - 1.2 mg/dL  0.7 0.5 0.4  Alkaline Phos 38 - 126 U/L 49 63 59  AST 15 - 41 U/L 30 38 20  ALT 0 - 44 U/L 43 55(H) 26    Lab Results  Component Value Date   WBC 4.3 04/06/2021   HGB 10.8 (L) 04/06/2021   HCT 34.0 (L) 04/06/2021   MCV 87.2 04/06/2021   PLT 153 04/06/2021   NEUTROABS 1.5 (L) 04/05/2021    US Abdomen Complete  Result Date: 04/05/2021 CLINICAL DATA:  Abdominal pain, pancreatic cancer with liver metastases, biliary stent placement EXAM: ABDOMEN ULTRASOUND COMPLETE COMPARISON:  PET-CT dated 02/28/2021 FINDINGS: Gallbladder: Underdistended with secondary mild wall thickening. Common bile duct: Diameter: 8 mm.  Indwelling stent. Liver: 2.7 x 1.9 x 3.0 cm hyperechoic lesion in the right hepatic lobe, likely corresponding to the patient's known benign hemangioma. Additional 8 x 5 x 7 mm cystic lesion in the right hepatic lobe, compatible with a benign cyst. Portal vein is patent on color Doppler imaging with normal direction of blood flow towards the liver. IVC: No abnormality visualized. Pancreas: Visualized portion unremarkable in this patient with known pancreatic cancer. Spleen: Size and appearance within normal limits. Right Kidney: Length: 10.2 cm. Echogenicity within normal limits. No mass or hydronephrosis visualized. Left Kidney: Length: 10.7 cm. Echogenicity within normal limits. No mass or hydronephrosis visualized. Abdominal aorta: No aneurysm  visualized. Other findings: None. IMPRESSION: Benign hepatic lesions on ultrasound. Suspected metastasis on recent PET is not evident on ultrasound. Indwelling common duct stent.  Common duct measures 8 mm. Electronically Signed   By: Julian Hy M.D.   On: 04/05/2021 23:32   US Abdomen Complete  Result Date: 03/13/2021 CLINICAL DATA:  Liver lesion EXAM: ABDOMEN ULTRASOUND COMPLETE COMPARISON:  PET CT 02/28/2021, CT 02/08/2021 FINDINGS: Gallbladder: Small amount of gallbladder sludge. Appearance of air within the gallbladder lumen. Negative  sonographic Murphy. Common bile duct: Diameter: 6 mm.  Air within the bile ducts. Liver: Cyst in the right hepatic lobe measuring 9 mm. Unable to visualize left hepatic dome lesion. No other discrete liver lesions are seen portal vein is patent on color Doppler imaging with normal direction of blood flow towards the liver. IVC: No abnormality visualized. Pancreas: Poorly visible. Known pancreatic head mass not well demonstrated on sonography. Echogenic area suggestive of air within the common bile duct at the head of pancreas likely due to history of stent Spleen: Size and appearance within normal limits. Right Kidney: Length: 10.4 cm. Echogenicity within normal limits. No mass or hydronephrosis visualized. Left Kidney: Length: 11 cm. Echogenicity within normal limits. No mass or hydronephrosis visualized. Abdominal aorta: No aneurysm visualized. Other findings: None. IMPRESSION: 1. A small cyst is visualized in the right hepatic lobe. Unable to visualized left hepatic lobe lesion or other question areas on recent PET imaging. 2. Air within the biliary system and gallbladder likely related to history of biliary stent placement. Poor visibility of known pancreatic head mass lesion. Electronically Signed   By: Donavan Foil M.D.   On: 03/13/2021 23:45    ASSESSMENT AND PLAN: 1.  Abdominal pain - ?  Acute pancreatitis 2.  Diarrhea 3.  Stage IV pancreatic cancer 4.  Mild anemia secondary to chemotherapy 5.  Depression/bipolar disorder  -The patient continues to have abdominal pain today.  Abdominal ultrasound did not show any acute abnormality.  Additionally, lipase has normalized.  She has developed intermittent fevers and diarrhea.  Will obtain stool for C. difficile and GI panel.  If fevers persist, recommend UA, urine culture, blood cultures, and CT abdomen/pelvis. -Advance diet as tolerated -We will resume chemotherapy as an outpatient when she recovers.   LOS: 1 day   Mikey Bussing, DNP, AGPCNP-BC,  AOCNP 04/06/21  Addendum I have seen the patient, examined her. I agree with the assessment and and plan and have edited the notes.   Merl's abdominal pain has improved some, but still required multiple doses of narcotics. She had headache from iv morphine, so switched to oxycodone today.  She had 1 episode of fever last night, mild diarrhea this morning, but has not been able to give a stool sample for test this afternoon.  Her lipase has normalized this morning, she tolerated liquid well, plan to eat spaghetti tonight. Will continue supportive care. Please consider blood culture if she spike fever again, may consider CT scan if pain still severe tomorrow. Will f/u   Truitt Merle  04/06/2021

## 2021-04-07 ENCOUNTER — Encounter: Payer: Self-pay | Admitting: Hematology

## 2021-04-07 ENCOUNTER — Other Ambulatory Visit: Payer: Self-pay | Admitting: Hematology

## 2021-04-07 ENCOUNTER — Telehealth: Payer: Self-pay | Admitting: Hematology

## 2021-04-07 LAB — LIPASE, BLOOD: Lipase: 29 U/L (ref 11–51)

## 2021-04-07 MED ORDER — HEPARIN SOD (PORK) LOCK FLUSH 100 UNIT/ML IV SOLN
500.0000 [IU] | Freq: Once | INTRAVENOUS | Status: AC
  Administered 2021-04-07: 500 [IU] via INTRAVENOUS
  Filled 2021-04-07: qty 5

## 2021-04-07 MED ORDER — OXYCODONE HCL 5 MG PO TABS: 5.0000 mg | ORAL_TABLET | Freq: Four times a day (QID) | ORAL | 0 refills | Status: DC | PRN

## 2021-04-07 NOTE — Telephone Encounter (Signed)
Scheduled follow-up appointments per 8/4 los. Patient is aware.

## 2021-04-07 NOTE — Discharge Summary (Signed)
Physician Discharge Summary  Robin [name] [id number] DOB: 16-Feb-1966 DOA: 04/05/2021  PCP: [name], MD  Admit date: 04/05/2021 Discharge date: 04/07/2021  Admitted From: home Disposition:  home  Recommendations for Outpatient Follow-up:  Dr. Annamaria Boots from oncology PCP and w/ Dr Burr Medico w/I a week ' Home Health:no  Equipment/Devices: none  Discharge Condition: Stable Code Status:   Code Status: Full Code Diet recommendation:  Diet Order             DIET SOFT Room service appropriate? Yes; Fluid consistency: Thin  Diet effective now                    Brief/Interim Summary:  55 y.o. female with medical history significant for pancreatic cancer with liver mets diagnosed June 2022 on FOLFIRINOX every 2 weeks started on 03/02/2021 followed by [name] who was seen at the cancer center today for abdominal pain.  Patient has been complaining of severe abdominal pain along with nausea and dark-colored urine. He pain started last night starting form left upper abdomen and across the mid and rt upper abdomen and to back, no simila rprior episode, mild nausea no vomiting, last BM yeaterday. Patient otherwise denies any vomiting, chest pain, shortness of breath, fever, headache, focal weakness, numbness tingling, speech difficulties.  She had routine labs done that showed elevated lipase patient was given IV fluid hydration along with IV morphine total 6 mg and admission requested for further management of acute pancreatitis. Patient was managed conservatively with IV fluids and IV opiates.  Significantly improved now tolerating diet.  Today.  Discharge home and outpatient follow-up  Discharge Diagnoses:   Pain in the abdomen suspecting acute pancreatitis Pain also contributed by pancreatic cancer Lipase is normal now.  Tolerating diet pain is routinely controlled we will resume her home oral opiates.  She will follow-up with her oncologist as outpatient. Ultrasound abdomen revealed 8/10  shows benign hepatic lesions on ultrasound suspected mets on recent PET is not evident, indwelling common bile duct stent present and duct measures 8 mm.   Pancreatic cancer with liver mets diagnosed June 2022 on FOLFIRINOX every 2 weeks started on 03/02/2021 followed by [name].    Depression/Bipolar I disorder: Mood currently stable.   History of hypersomnia Consults: Oncology ove the phone   Subjective: Tolerating diet, ready for home  Discharge Exam: Vitals:   04/06/21 2051 04/07/21 0521  BP: 123/62 (!) 125/56  Pulse: (!) 102 70  Resp: 18 14  Temp: 99.3 F (37.4 C) (!) 97.5 F (36.4 C)  SpO2: 97% 98%   General: Pt is alert, awake, not in acute distress Cardiovascular: RRR, S1/S2 +, no rubs, no gallops Respiratory: CTA bilaterally, no wheezing, no rhonchi Abdominal: Soft, NT, ND, bowel sounds + Extremities: no edema, no cyanosis  Discharge Instructions  Discharge Instructions     Discharge instructions   Complete by: As directed    Please call call MD or return to ER for similar or worsening recurring problem that brought you to hospital or if any fever,nausea/vomiting,abdominal pain, uncontrolled pain, chest pain,  shortness of breath or any other alarming symptoms.  Please follow-up your doctor as instructed in a week time and call the office for appointment.  Please avoid alcohol, smoking, or any other illicit substance and maintain healthy habits including taking your regular medications as prescribed.  You were cared for by a hospitalist during your hospital stay. If you have any questions about your discharge medications or the care  you received while you were in the hospital after you are discharged, you can call the unit and ask to speak with the hospitalist on call if the hospitalist that took care of you is not available.  Once you are discharged, your primary care physician will handle any further medical issues. Please note that NO REFILLS for any discharge  medications will be authorized once you are discharged, as it is imperative that you return to your primary care physician (or establish a relationship with a primary care physician if you do not have one) for your aftercare needs so that they can reassess your need for medications and monitor your lab values   Increase activity slowly   Complete by: As directed       Allergies as of 04/07/2021       Reactions   Aspirin Nausea And Vomiting, Rash   Bee Venom Anaphylaxis   Penicillin G Anaphylaxis   Penicillins Anaphylaxis   Bactrim [sulfamethoxazole-trimethoprim] Nausea And Vomiting   Coconut Oil Other (See Comments)   Caused wheezing   Sulfa Antibiotics Nausea And Vomiting   Sulfamethoxazole-trimethoprim Nausea And Vomiting        Medication List     TAKE these medications    acetaminophen 325 MG tablet Commonly known as: TYLENOL Take 325-650 mg by mouth every 6 (six) hours as needed for mild pain or headache.   albuterol 108 (90 Base) MCG/ACT inhaler Commonly known as: VENTOLIN HFA Inhale 2 puffs into the lungs every 6 (six) hours as needed for wheezing or shortness of breath.   cyclobenzaprine 5 MG tablet Commonly known as: FLEXERIL TAKE 1 TABLET(5 MG) BY MOUTH AT BEDTIME AS NEEDED FOR LOWER BACK PAIN OR SEVERE HEADACHE What changed: See the new instructions.   diphenoxylate-atropine 2.5-0.025 MG tablet Commonly known as: LOMOTIL Take 1-2 tabs by mouth 4 times a day as needed for loose stools.   fluticasone 50 MCG/ACT nasal spray Commonly known as: FLONASE Place 2 sprays into both nostrils daily.   gabapentin 300 MG capsule Commonly known as: NEURONTIN Take 300 mg by mouth 3 (three) times daily as needed (for neuropathic pain).   HYDROcodone-acetaminophen 5-325 MG tablet Commonly known as: NORCO/VICODIN Take 1 tablet by mouth every 6 (six) hours as needed for moderate pain or severe pain.   magic mouthwash w/lidocaine Soln Take 10 mLs by mouth 4 (four) times  daily as needed for mouth pain. 10 mls swish and spit by mouth four times daily as needed   montelukast 10 MG tablet Commonly known as: SINGULAIR Take 10 mg by mouth daily.   multivitamin with minerals Tabs tablet Take 1 tablet by mouth daily.   ondansetron 8 MG tablet Commonly known as: ZOFRAN Take 1 tablet (8 mg total) by mouth every 8 (eight) hours as needed for nausea or vomiting. Starting on day 3 after chemotherapy   oxyCODONE 5 MG immediate release tablet Commonly known as: Oxy IR/ROXICODONE Take 1 tablet (5 mg total) by mouth every 4 (four) hours as needed for severe pain.   PRESCRIPTION MEDICATION Inject into the vein See admin instructions. FOLFIRINOX- Every 14 days for 8 treatments   promethazine 25 MG tablet Commonly known as: PHENERGAN Take 25 mg by mouth every 6 (six) hours as needed for vomiting or nausea.       ASK your doctor about these medications    EPINEPHrine 0.3 mg/0.3 mL Soaj injection Commonly known as: EPI-PEN Inject 0.3 mg into the muscle as needed for anaphylaxis. INJECT 0.3  MLS (0.3 MG TOTAL) INTO THE MUSCLE ONCE   Latuda 40 MG Tabs tablet Generic drug: lurasidone Take 1 tablet (40 mg total) by mouth daily with breakfast.   lidocaine-prilocaine cream Commonly known as: EMLA Apply 1 application topically as needed.   lidocaine-prilocaine cream Commonly known as: EMLA Apply to affected area once   ondansetron 8 MG tablet Commonly known as: Zofran Take 1 tablet (8 mg total) by mouth 2 (two) times daily as needed. Start on day 3 after chemotherapy.   prochlorperazine 10 MG tablet Commonly known as: COMPAZINE Take 1 tablet (10 mg total) by mouth every 6 (six) hours as needed.   prochlorperazine 10 MG tablet Commonly known as: COMPAZINE Take 1 tablet (10 mg total) by mouth every 6 (six) hours as needed (Nausea or vomiting).        Follow-up Information     [name], MD Follow up in 1 week(s).   Specialty: Family  Medicine Contact information: [place] [address] [phone]         Jerline Pain, MD .   Specialty: Cardiology Contact information: [phone] [address] [place] [address] [phone]                Allergies  Allergen Reactions   Aspirin Nausea And Vomiting and Rash   Bee Venom Anaphylaxis   Penicillin G Anaphylaxis   Penicillins Anaphylaxis   Bactrim [Sulfamethoxazole-Trimethoprim] Nausea And Vomiting   Coconut Oil Other (See Comments)    Caused wheezing   Sulfa Antibiotics Nausea And Vomiting   Sulfamethoxazole-Trimethoprim Nausea And Vomiting    The results of significant diagnostics from this hospitalization (including imaging, microbiology, ancillary and laboratory) are listed below for reference.    Microbiology: Recent Results (from the past 240 hour(s))  Resp Panel by RT-PCR (Flu A&B, Covid) Nasopharyngeal Swab     Status: None   Collection Time: 04/05/21  6:11 PM   Specimen: Nasopharyngeal Swab; Nasopharyngeal(NP) swabs in vial transport medium  Result Value Ref Range Status   SARS Coronavirus 2 by RT PCR NEGATIVE NEGATIVE Final    Comment: (NOTE) SARS-CoV-2 target nucleic acids are NOT DETECTED.  The SARS-CoV-2 RNA is generally detectable in upper respiratory specimens during the acute phase of infection. The lowest concentration of SARS-CoV-2 viral copies this assay can detect is 138 copies/mL. A negative result does not preclude SARS-Cov-2 infection and should not be used as the sole basis for treatment or other patient management decisions. A negative result may occur with  improper specimen collection/handling, submission of specimen other than nasopharyngeal swab, presence of viral mutation(s) within the areas targeted by this assay, and inadequate number of viral copies(<138 copies/mL). A negative result must be combined with clinical observations, patient history, and epidemiological information. The  expected result is Negative.  Fact Sheet for Patients:  EntrepreneurPulse.com.au  Fact Sheet for Healthcare Providers:  IncredibleEmployment.be  This test is no t yet approved or cleared by the [place] FDA and  has been authorized for detection and/or diagnosis of SARS-CoV-2 by FDA under an Emergency Use Authorization (EUA). This EUA will remain  in effect (meaning this test can be used) for the duration of the COVID-19 declaration under Section 564(b)(1) of the Act, 21 U.S.C.section 360bbb-3(b)(1), unless the authorization is terminated  or revoked sooner.       Influenza A by PCR NEGATIVE NEGATIVE Final   Influenza B by PCR NEGATIVE NEGATIVE Final    Comment: (NOTE) The Xpert Xpress  SARS-CoV-2/FLU/RSV plus assay is intended as an aid in the diagnosis of influenza from Nasopharyngeal swab specimens and should not be used as a sole basis for treatment. Nasal washings and aspirates are unacceptable for Xpert Xpress SARS-CoV-2/FLU/RSV testing.  Fact Sheet for Patients: EntrepreneurPulse.com.au  Fact Sheet for Healthcare Providers: IncredibleEmployment.be  This test is not yet approved or cleared by the [place] FDA and has been authorized for detection and/or diagnosis of SARS-CoV-2 by FDA under an Emergency Use Authorization (EUA). This EUA will remain in effect (meaning this test can be used) for the duration of the COVID-19 declaration under Section 564(b)(1) of the Act, 21 U.S.C. section 360bbb-3(b)(1), unless the authorization is terminated or revoked.  Performed at [facility], [place] [address] [place], [place] [address]     Procedures/Studies: US Abdomen Complete  Result Date: 04/05/2021 CLINICAL DATA:  Abdominal pain, pancreatic cancer with liver metastases, biliary stent placement EXAM: ABDOMEN ULTRASOUND COMPLETE COMPARISON:  PET-CT dated 02/28/2021 FINDINGS:  Gallbladder: Underdistended with secondary mild wall thickening. Common bile duct: Diameter: 8 mm.  Indwelling stent. Liver: 2.7 x 1.9 x 3.0 cm hyperechoic lesion in the right hepatic lobe, likely corresponding to the patient's known benign hemangioma. Additional 8 x 5 x 7 mm cystic lesion in the right hepatic lobe, compatible with a benign cyst. Portal vein is patent on color Doppler imaging with normal direction of blood flow towards the liver. IVC: No abnormality visualized. Pancreas: Visualized portion unremarkable in this patient with known pancreatic cancer. Spleen: Size and appearance within normal limits. Right Kidney: Length: 10.2 cm. Echogenicity within normal limits. No mass or hydronephrosis visualized. Left Kidney: Length: 10.7 cm. Echogenicity within normal limits. No mass or hydronephrosis visualized. Abdominal aorta: No aneurysm visualized. Other findings: None. IMPRESSION: Benign hepatic lesions on ultrasound. Suspected metastasis on recent PET is not evident on ultrasound. Indwelling common duct stent.  Common duct measures 8 mm. Electronically Signed   By: Julian Hy M.D.   On: 04/05/2021 23:32   US Abdomen Complete  Result Date: 03/13/2021 CLINICAL DATA:  Liver lesion EXAM: ABDOMEN ULTRASOUND COMPLETE COMPARISON:  PET CT 02/28/2021, CT 02/08/2021 FINDINGS: Gallbladder: Small amount of gallbladder sludge. Appearance of air within the gallbladder lumen. Negative sonographic Murphy. Common bile duct: Diameter: 6 mm.  Air within the bile ducts. Liver: Cyst in the right hepatic lobe measuring 9 mm. Unable to visualize left hepatic dome lesion. No other discrete liver lesions are seen portal vein is patent on color Doppler imaging with normal direction of blood flow towards the liver. IVC: No abnormality visualized. Pancreas: Poorly visible. Known pancreatic head mass not well demonstrated on sonography. Echogenic area suggestive of air within the common bile duct at the head of pancreas  likely due to history of stent Spleen: Size and appearance within normal limits. Right Kidney: Length: 10.4 cm. Echogenicity within normal limits. No mass or hydronephrosis visualized. Left Kidney: Length: 11 cm. Echogenicity within normal limits. No mass or hydronephrosis visualized. Abdominal aorta: No aneurysm visualized. Other findings: None. IMPRESSION: 1. A small cyst is visualized in the right hepatic lobe. Unable to visualized left hepatic lobe lesion or other question areas on recent PET imaging. 2. Air within the biliary system and gallbladder likely related to history of biliary stent placement. Poor visibility of known pancreatic head mass lesion. Electronically Signed   By: Donavan Foil M.D.   On: 03/13/2021 23:45    Labs: BNP (last 3 results) No results for input(s): BNP in the last 8760  hours. Basic Metabolic Panel: Recent Labs  Lab 04/05/21 1154 04/06/21 0512  NA 139 139  K 3.9 3.8  CL 107 106  CO2 22 26  GLUCOSE 102* 109*  BUN 13 9  CREATININE 0.85 0.86  CALCIUM 9.4 8.9   Liver Function Tests: Recent Labs  Lab 04/05/21 1154 04/06/21 0512  AST 38 30  ALT 55* 43  ALKPHOS 63 49  BILITOT 0.5 0.7  PROT 7.0 6.0*  ALBUMIN 3.7 3.2*   Recent Labs  Lab 04/05/21 1154 04/06/21 0512 04/07/21 0451  LIPASE 155* 35 29   No results for input(s): AMMONIA in the last 168 hours. CBC: Recent Labs  Lab 04/05/21 1154 04/06/21 0512  WBC 5.1 4.3  NEUTROABS 1.5*  --   HGB 11.9* 10.8*  HCT 36.7 34.0*  MCV 85.3 87.2  PLT 172 153   Cardiac Enzymes: No results for input(s): CKTOTAL, CKMB, CKMBINDEX, TROPONINI in the last 168 hours. BNP: Invalid input(s): POCBNP CBG: No results for input(s): GLUCAP in the last 168 hours. D-Dimer No results for input(s): DDIMER in the last 72 hours. Hgb A1c No results for input(s): HGBA1C in the last 72 hours. Lipid Profile No results for input(s): CHOL, HDL, LDLCALC, TRIG, CHOLHDL, LDLDIRECT in the last 72 hours. Thyroid function  studies No results for input(s): TSH, T4TOTAL, T3FREE, THYROIDAB in the last 72 hours.  Invalid input(s): FREET3 Anemia work up No results for input(s): VITAMINB12, FOLATE, FERRITIN, TIBC, IRON, RETICCTPCT in the last 72 hours. Urinalysis    Component Value Date/Time   COLORURINE STRAW (A) 04/05/2021 1827   APPEARANCEUR CLEAR 04/05/2021 1827   LABSPEC 1.009 04/05/2021 1827   PHURINE 6.0 04/05/2021 1827   GLUCOSEU NEGATIVE 04/05/2021 1827   HGBUR NEGATIVE 04/05/2021 1827   BILIRUBINUR NEGATIVE 04/05/2021 1827   KETONESUR NEGATIVE 04/05/2021 1827   PROTEINUR NEGATIVE 04/05/2021 1827   UROBILINOGEN 1.0 02/07/2015 2317   NITRITE NEGATIVE 04/05/2021 1827   LEUKOCYTESUR TRACE (A) 04/05/2021 1827   Sepsis Labs Invalid input(s): PROCALCITONIN,  WBC,  LACTICIDVEN Microbiology Recent Results (from the past 240 hour(s))  Resp Panel by RT-PCR (Flu A&B, Covid) Nasopharyngeal Swab     Status: None   Collection Time: 04/05/21  6:11 PM   Specimen: Nasopharyngeal Swab; Nasopharyngeal(NP) swabs in vial transport medium  Result Value Ref Range Status   SARS Coronavirus 2 by RT PCR NEGATIVE NEGATIVE Final    Comment: (NOTE) SARS-CoV-2 target nucleic acids are NOT DETECTED.  The SARS-CoV-2 RNA is generally detectable in upper respiratory specimens during the acute phase of infection. The lowest concentration of SARS-CoV-2 viral copies this assay can detect is 138 copies/mL. A negative result does not preclude SARS-Cov-2 infection and should not be used as the sole basis for treatment or other patient management decisions. A negative result may occur with  improper specimen collection/handling, submission of specimen other than nasopharyngeal swab, presence of viral mutation(s) within the areas targeted by this assay, and inadequate number of viral copies(<138 copies/mL). A negative result must be combined with clinical observations, patient history, and epidemiological information. The  expected result is Negative.  Fact Sheet for Patients:  EntrepreneurPulse.com.au  Fact Sheet for Healthcare Providers:  IncredibleEmployment.be  This test is no t yet approved or cleared by the [place] FDA and  has been authorized for detection and/or diagnosis of SARS-CoV-2 by FDA under an Emergency Use Authorization (EUA). This EUA will remain  in effect (meaning this test can be used) for the duration of the COVID-19 declaration under  Section 564(b)(1) of the Act, 21 U.S.C.section 360bbb-3(b)(1), unless the authorization is terminated  or revoked sooner.       Influenza A by PCR NEGATIVE NEGATIVE Final   Influenza B by PCR NEGATIVE NEGATIVE Final    Comment: (NOTE) The Xpert Xpress SARS-CoV-2/FLU/RSV plus assay is intended as an aid in the diagnosis of influenza from Nasopharyngeal swab specimens and should not be used as a sole basis for treatment. Nasal washings and aspirates are unacceptable for Xpert Xpress SARS-CoV-2/FLU/RSV testing.  Fact Sheet for Patients: EntrepreneurPulse.com.au  Fact Sheet for Healthcare Providers: IncredibleEmployment.be  This test is not yet approved or cleared by the [place] FDA and has been authorized for detection and/or diagnosis of SARS-CoV-2 by FDA under an Emergency Use Authorization (EUA). This EUA will remain in effect (meaning this test can be used) for the duration of the COVID-19 declaration under Section 564(b)(1) of the Act, 21 U.S.C. section 360bbb-3(b)(1), unless the authorization is terminated or revoked.  Performed at [facility], [place] [address] [place], [place] [address]      Time coordinating discharge: 25 minutes  SIGNED: Antonieta Pert, MD  [facility] 04/07/2021, 1:38 PM  If 7PM-7AM, please contact night-coverage [email or web address]

## 2021-04-10 ENCOUNTER — Telehealth: Payer: Self-pay

## 2021-04-10 NOTE — Telephone Encounter (Cosign Needed)
Transition Care Management Follow-up Telephone Call Date of discharge and from where: 04/07/21 [facility] How have you been since you were released from the hospital? Doing better Any questions or concerns? No  Items Reviewed: Did the pt receive and understand the discharge instructions provided? Yes  Medications obtained and verified? Yes  Other? No  Any new allergies since your discharge? No  Dietary orders reviewed? Yes Do you have support at home? Yes   Home Care and Equipment/Supplies: Were home health services ordered? not applicable If so, what is the name of the agency?   Has the agency set up a time to come to the patient's home? not applicable Were any new equipment or medical supplies ordered?  No What is the name of the medical supply agency?  Were you able to get the supplies/equipment? not applicable Do you have any questions related to the use of the equipment or supplies? No  Functional Questionnaire: (I = Independent and D = Dependent) ADLs: I  Bathing/Dressing- I  Meal Prep- I  Eating- I  Maintaining continence- I  Transferring/Ambulation- I  Managing Meds- I  Follow up appointments reviewed:  PCP Hospital f/u appt confirmed? No  pt stated she will make a follow up appt 1st week of sept , stating she is following up with oncology [facility] f/u appt confirmed? Yes  Scheduled to see Oncology  Are transportation arrangements needed? No  If their condition worsens, is the pt aware to call PCP or go to the Emergency Dept.? Yes Was the patient provided with contact information for the PCP's office or ED? Yes Was to pt encouraged to call back with questions or concerns? Yes

## 2021-04-11 ENCOUNTER — Encounter: Payer: Self-pay | Admitting: Hematology

## 2021-04-12 ENCOUNTER — Encounter: Payer: Self-pay | Admitting: Hematology

## 2021-04-12 MED FILL — Dexamethasone Sodium Phosphate Inj 100 MG/10ML: INTRAMUSCULAR | Qty: 1 | Status: AC

## 2021-04-12 MED FILL — Fosaprepitant Dimeglumine For IV Infusion 150 MG (Base Eq): INTRAVENOUS | Qty: 5 | Status: AC

## 2021-04-13 ENCOUNTER — Inpatient Hospital Stay (HOSPITAL_BASED_OUTPATIENT_CLINIC_OR_DEPARTMENT_OTHER): Payer: 59 | Admitting: Nurse Practitioner

## 2021-04-13 ENCOUNTER — Inpatient Hospital Stay: Payer: 59

## 2021-04-13 ENCOUNTER — Other Ambulatory Visit: Payer: Self-pay

## 2021-04-13 ENCOUNTER — Encounter: Payer: Self-pay | Admitting: Nurse Practitioner

## 2021-04-13 VITALS — BP 127/86 | HR 74 | Temp 97.6°F | Resp 18 | Ht 69.5 in | Wt 205.1 lb

## 2021-04-13 DIAGNOSIS — Z5111 Encounter for antineoplastic chemotherapy: Secondary | ICD-10-CM | POA: Diagnosis present

## 2021-04-13 DIAGNOSIS — G620 Drug-induced polyneuropathy: Secondary | ICD-10-CM | POA: Diagnosis not present

## 2021-04-13 DIAGNOSIS — Z79899 Other long term (current) drug therapy: Secondary | ICD-10-CM | POA: Diagnosis not present

## 2021-04-13 DIAGNOSIS — C250 Malignant neoplasm of head of pancreas: Secondary | ICD-10-CM

## 2021-04-13 DIAGNOSIS — Z95828 Presence of other vascular implants and grafts: Secondary | ICD-10-CM

## 2021-04-13 DIAGNOSIS — R1013 Epigastric pain: Secondary | ICD-10-CM | POA: Diagnosis not present

## 2021-04-13 DIAGNOSIS — T451X5A Adverse effect of antineoplastic and immunosuppressive drugs, initial encounter: Secondary | ICD-10-CM | POA: Diagnosis not present

## 2021-04-13 LAB — CMP (CANCER CENTER ONLY)
ALT: 36 U/L (ref 0–44)
AST: 31 U/L (ref 15–41)
Albumin: 3.5 g/dL (ref 3.5–5.0)
Alkaline Phosphatase: 62 U/L (ref 38–126)
Anion gap: 8 (ref 5–15)
BUN: 15 mg/dL (ref 6–20)
CO2: 25 mmol/L (ref 22–32)
Calcium: 9.6 mg/dL (ref 8.9–10.3)
Chloride: 108 mmol/L (ref 98–111)
Creatinine: 1.08 mg/dL — ABNORMAL HIGH (ref 0.44–1.00)
GFR, Estimated: >60 mL/min (ref >60)
Glucose, Bld: 90 mg/dL (ref 70–99)
Potassium: 4.3 mmol/L (ref 3.5–5.1)
Sodium: 141 mmol/L (ref 135–145)
Total Bilirubin: 0.5 mg/dL (ref 0.3–1.2)
Total Protein: 7 g/dL (ref 6.5–8.1)

## 2021-04-13 LAB — CBC WITH DIFFERENTIAL (CANCER CENTER ONLY)
Abs Immature Granulocytes: 0.01 10*3/uL (ref 0.00–0.07)
Basophils Absolute: 0 10*3/uL (ref 0.0–0.1)
Basophils Relative: 1 %
Eosinophils Absolute: 0.1 10*3/uL (ref 0.0–0.5)
Eosinophils Relative: 2 %
HCT: 36 % (ref 36.0–46.0)
Hemoglobin: 11.6 g/dL — ABNORMAL LOW (ref 12.0–15.0)
Immature Granulocytes: 0 %
Lymphocytes Relative: 42 %
Lymphs Abs: 2.8 10*3/uL (ref 0.7–4.0)
MCH: 27.7 pg (ref 26.0–34.0)
MCHC: 32.2 g/dL (ref 30.0–36.0)
MCV: 85.9 fL (ref 80.0–100.0)
Monocytes Absolute: 0.6 10*3/uL (ref 0.1–1.0)
Monocytes Relative: 8 %
Neutro Abs: 3.1 10*3/uL (ref 1.7–7.7)
Neutrophils Relative %: 47 %
Platelet Count: 198 10*3/uL (ref 150–400)
RBC: 4.19 MIL/uL (ref 3.87–5.11)
RDW: 14.9 % (ref 11.5–15.5)
WBC Count: 6.7 10*3/uL (ref 4.0–10.5)
nRBC: 0 % (ref 0.0–0.2)

## 2021-04-13 MED ORDER — SODIUM CHLORIDE 0.9 % IV SOLN: Freq: Once | INTRAVENOUS | Status: AC

## 2021-04-13 MED ORDER — SODIUM CHLORIDE 0.9% FLUSH
10.0000 mL | Freq: Once | INTRAVENOUS | Status: AC
  Administered 2021-04-13: 10 mL

## 2021-04-13 MED ORDER — PALONOSETRON HCL INJECTION 0.25 MG/5ML
0.2500 mg | Freq: Once | INTRAVENOUS | Status: AC
  Administered 2021-04-13: 0.25 mg via INTRAVENOUS
  Filled 2021-04-13: qty 5

## 2021-04-13 MED ORDER — OXALIPLATIN CHEMO INJECTION 100 MG/20ML
70.0000 mg/m2 | Freq: Once | INTRAVENOUS | Status: AC
  Administered 2021-04-13: 150 mg via INTRAVENOUS
  Filled 2021-04-13: qty 30

## 2021-04-13 MED ORDER — SODIUM CHLORIDE 0.9 % IV SOLN
2348.0000 mg/m2 | INTRAVENOUS | Status: DC
  Administered 2021-04-13: 5000 mg via INTRAVENOUS
  Filled 2021-04-13: qty 100

## 2021-04-13 MED ORDER — SODIUM CHLORIDE 0.9 % IV SOLN
150.0000 mg | Freq: Once | INTRAVENOUS | Status: AC
  Administered 2021-04-13: 150 mg via INTRAVENOUS
  Filled 2021-04-13: qty 150

## 2021-04-13 MED ORDER — SODIUM CHLORIDE 0.9 % IV SOLN
150.0000 mg/m2 | Freq: Once | INTRAVENOUS | Status: AC
  Administered 2021-04-13: 320 mg via INTRAVENOUS
  Filled 2021-04-13: qty 16

## 2021-04-13 MED ORDER — SODIUM CHLORIDE 0.9 % IV SOLN
10.0000 mg | Freq: Once | INTRAVENOUS | Status: AC
  Administered 2021-04-13: 10 mg via INTRAVENOUS
  Filled 2021-04-13: qty 10

## 2021-04-13 MED ORDER — DEXTROSE 5 % IV SOLN: Freq: Once | INTRAVENOUS | Status: AC

## 2021-04-13 MED ORDER — SODIUM CHLORIDE 0.9 % IV SOLN
400.0000 mg/m2 | Freq: Once | INTRAVENOUS | Status: DC
  Filled 2021-04-13: qty 42.8

## 2021-04-13 MED ORDER — ATROPINE SULFATE 1 MG/ML IJ SOLN
0.5000 mg | Freq: Once | INTRAMUSCULAR | Status: AC | PRN
  Administered 2021-04-13: 0.5 mg via INTRAVENOUS
  Filled 2021-04-13: qty 1

## 2021-04-13 MED ORDER — SODIUM CHLORIDE 0.9 % IV SOLN
400.0000 mg/m2 | Freq: Once | INTRAVENOUS | Status: AC
  Administered 2021-04-13: 856 mg via INTRAVENOUS
  Filled 2021-04-13: qty 42.8

## 2021-04-13 NOTE — Patient Instructions (Signed)
[facility]  Discharge Instructions: Thank you for choosing [facility] to provide your oncology and hematology care.   If you have a lab appointment with the [facility], please go directly to the [facility] and check in at the registration area.   Wear comfortable clothing and clothing appropriate for easy access to any Portacath or PICC line.   We strive to give you quality time with your provider. You may need to reschedule your appointment if you arrive late (15 or more minutes).  Arriving late affects you and other patients whose appointments are after yours.  Also, if you miss three or more appointments without notifying the office, you may be dismissed from the clinic at the provider's discretion.      For prescription refill requests, have your pharmacy contact our office and allow 72 hours for refills to be completed.    Today you received the following chemotherapy and/or immunotherapy agents FOLFIRINOX      To help prevent nausea and vomiting after your treatment, we encourage you to take your nausea medication as directed.  BELOW ARE SYMPTOMS THAT SHOULD BE REPORTED IMMEDIATELY: *FEVER GREATER THAN 100.4 F (38 C) OR HIGHER *CHILLS OR SWEATING *NAUSEA AND VOMITING THAT IS NOT CONTROLLED WITH YOUR NAUSEA MEDICATION *UNUSUAL SHORTNESS OF BREATH *UNUSUAL BRUISING OR BLEEDING *URINARY PROBLEMS (pain or burning when urinating, or frequent urination) *BOWEL PROBLEMS (unusual diarrhea, constipation, pain near the anus) TENDERNESS IN MOUTH AND THROAT WITH OR WITHOUT PRESENCE OF ULCERS (sore throat, sores in mouth, or a toothache) UNUSUAL RASH, SWELLING OR PAIN  UNUSUAL VAGINAL DISCHARGE OR ITCHING   Items with * indicate a potential emergency and should be followed up as soon as possible or go to the Emergency Department if any problems should occur.  Please show the CHEMOTHERAPY ALERT CARD or IMMUNOTHERAPY ALERT CARD at check-in to  the Emergency Department and triage nurse.  Should you have questions after your visit or need to cancel or reschedule your appointment, please contact [facility]  Dept: [phone]  and follow the prompts.  Office hours are 8:00 a.m. to 4:30 p.m. Monday - Friday. Please note that voicemails left after 4:00 p.m. may not be returned until the following business day.  We are closed weekends and major holidays. You have access to a nurse at all times for urgent questions. Please call the main number to the clinic Dept: [phone] and follow the prompts.   For any non-urgent questions, you may also contact your provider using MyChart. We now offer e-Visits for anyone 56 and older to request care online for non-urgent symptoms. For details visit [email or web address].   Also download the MyChart app! Go to the app store, search "MyChart", open the app, select [facility], and log in with your MyChart username and password.  Due to Covid, a mask is required upon entering the hospital/clinic. If you do not have a mask, one will be given to you upon arrival. For doctor visits, patients may have 1 support person aged 56 or older with them. For treatment visits, patients cannot have anyone with them due to current Covid guidelines and our immunocompromised population.    The chemotherapy medication bag should finish at 46 hours, 96 hours, or 7 days. For example, if your pump is scheduled for 46 hours and it was put on at 4:00 p.m., it should finish at 2:00 p.m. the day it is scheduled to come off regardless of  your appointment time.     Estimated time to finish at 2:20pm on Saturday, 04/15/2021.   If the display on your pump reads "Low Volume" and it is beeping, take the batteries out of the pump and come to the cancer center for it to be taken off.   If the pump alarms go off prior to the pump reading "Low Volume" then call [phone] and someone can assist you.  If  the plunger comes out and the chemotherapy medication is leaking out, please use your home chemo spill kit to clean up the spill. Do NOT use paper towels or other household products.  If you have problems or questions regarding your pump, please call either [phone] (24 hours a day) or the cancer center Monday-Friday 8:00 a.m.- 4:30 p.m. at the clinic number and we will assist you. If you are unable to get assistance, then go to the nearest Emergency Department and ask the staff to contact the IV team for assistance.

## 2021-04-13 NOTE — Progress Notes (Signed)
[place]   [phone] [phone]   Clinic Follow up Note   Patient Care Team: [name], MD as PCP - General (Family Medicine) Jerline Pain, MD as PCP - Cardiology (Cardiology) End, Harrell Gave, MD as Consulting Physician (Cardiology) Alda Berthold, DO as Consulting Physician (Neurology) Heilingoetter, Tobe Sos, PA-C as Physician Assistant (Physician Assistant) Truitt Merle, MD as Consulting Physician (Oncology) Dwan Bolt, MD as Consulting Physician (General Surgery) 04/13/2021  CHIEF COMPLAINT: Follow up pancreas cancer, recent hospitalization   SUMMARY OF ONCOLOGIC HISTORY: Oncology History Overview Note  Cancer Staging Pancreatic cancer [facility]) Staging form: Exocrine Pancreas, AJCC 8th Edition - Clinical stage from 02/09/2021: Stage IV (cT2, cN0, cM1) - Signed by Truitt Merle, MD on 03/16/2021 Total positive nodes: 0    Pancreatic cancer ([place])  01/11/2021 Imaging   CT Abdomen/Pelvis  IMPRESSION: Multiple low-attenuation liver lesions, which cannot be characterized on this unenhanced exam. Abdomen MRI without and with contrast is recommended for further characterization.   Diffuse biliary ductal dilatation, with soft tissue prominence in the region of the pancreatic head. Pancreatic mass cannot be excluded on this unenhanced exam. Recommend abdomen MRI and MRCP without and with contrast for further evaluation.   Colonic diverticulosis, without radiographic evidence of diverticulitis.     01/12/2021 Imaging   MRI ABDOMEN WITHOUT AND WITH CONTRAST (INCLUDING MRCP)  IMPRESSION: Biliary duct distension of both intra and extrahepatic biliary tree with abrupt caliber change but with no visible mass on imaging. Caliber change more abrupt than expected for stricture and the biliary tree was top-normal size at 6 mm in 2017. Endoscopic assessment is suggested to exclude an occult biliary lesion or atypical stricture.    Lobulated T2 bright lesion in the medial segment of the LEFT hepatic lobe that favors a sclerosed hemangioma. There is some internal septation suggested and the filling pattern is slightly atypical. Consider 3 to six-month follow-up with extended delays following contrast to 7 minutes better assess filling characteristics, differential consideration would include a biliary cystadenoma with septal enhancement.   01/13/2021 Procedure   ERCP under the care of Dr. Carlean Purl  IMPRESSION: 1. Dilation of the common bile duct and main pancreatic duct. 2. Sphincterotomy and placement of a plastic biliary stent.    01/13/2021 Pathology Results   CASE: [id number]   A. COMMON BILE DUCT, STRICTURE, #1, BRUSHING:   FINAL MICROSCOPIC DIAGNOSIS:  - Suspicious for malignancy  - See comment    02/08/2021 Imaging   CT CHEST WITHOUT CONTRAST AND CT OF ABDOMEN AND PELVIS WITH CONTRAST  IMPRESSION: 1. Findings are highly suspicious for a hypovascular mass in the posterior aspect of the head of the pancreas, as detailed above. This appears associated with persistent narrowing of the distal common bile duct and abrupt termination of the pancreatic duct. Common bile duct stent appears appropriately located, but is associated with some persistent dilatation of the common bile duct. 2. 1.2 x 0.7 cm ground-glass attenuation nodule in the right upper lobe. Initial follow-up with CT at 6-12 months is recommended to confirm persistence. If persistent, repeat CT is recommended every 2 years until 5 years of stability has been established. This recommendation follows the consensus statement: Guidelines for Management of Incidental Pulmonary Nodules Detected on CT Images: From the Fleischner Society 2017; Radiology 2017; 284:228-243. 3. Multiple liver lesions, smallest of which likely represent tiny simple cysts. The larger lesion in the left lobe of the liver has imaging characteristics once again most  suggestive of  a cavernous hemangioma. 4. Aortic atherosclerosis, in addition to left anterior descending coronary artery disease. Please note that although the presence of coronary artery calcium documents the presence of coronary artery disease, the severity of this disease and any potential stenosis cannot be assessed on this non-gated CT examination. Assessment for potential risk factor modification, dietary therapy or pharmacologic therapy may be warranted, if clinically indicated. 5. Additional incidental findings, as above.    02/09/2021 Procedure   ERCP under the care of Dr. Rush Landmark   IMPRESSION: Limited images during ERCP demonstrates removal plastic biliary stent and placement of a new metallic biliary stent. Please refer to the dictated operative report for full details of intraoperative findings and procedure.   02/09/2021 Pathology Results   CASE: [id number]  FINAL MICROSCOPIC DIAGNOSIS:  A. PANCREAS, HEAD OF LESION, FINE NEEDLE ASPIRATION:  - Malignant cells consistent with adenocarcinoma    02/09/2021 Procedure   EGD Impression: - No gross lesions in esophagus. Z-line regular, 40 cm from the incisors. - Severe gastritis noted. No other gross lesions in the stomach. Biopsied. - Plastic biliary stent in the duodenum. Removed. Revealed a patent biliary sphincterotomy was found. - No gross lesions in the duodenal bulb, in the first portion of the duodenum and in the second portion of the duodenum. EUS Impression: - Hyperechoic material consistent with sludge was visualized endosonographically in the gallbladder. - There was a suggestion of a stricture in the lower third of the main bile duct leading to dilation in the middle third of the main bile duct and in the upper third of the main bile duct with evidence of hyperechoic material consistent with sludge within the common bile duct. - A mass-like region was identified in the pancreatic head where the CBD  narrowed and where the upstream PD dilation was noted. This is not the most confluent of masses however. Cytology results are pending. However, the endosonographic appearance is suggestive of potential pancreatic adenocarcinoma. This was staged T2 N0 Mx by endosonographic criteria. The staging applies if malignancy is confirmed. Fine needle biopsy performed. - No malignant-appearing lymph nodes were visualized in the celiac region (level 20), peripancreatic region and porta hepatis region.   02/09/2021 Cancer Staging   Staging form: Exocrine Pancreas, AJCC 8th Edition - Clinical stage from 02/09/2021: Stage IV (cT2, cN0, cM1) - Signed by Truitt Merle, MD on 03/16/2021 Total positive nodes: 0   02/13/2021 Initial Diagnosis   Pancreatic cancer ([place])   02/28/2021 Imaging   PET  Signs of pancreatic head mass with suspected hepatic metastatic lesion in the RIGHT hepatic lobe as described.   Equivocal uptake in adjacent lymph nodes, potentially reactive in the setting of biliary stent. Note the biliary stent placement is in the mid to distal common bile duct but appears similar to recent ERCP images.   Stable appearance of ground-glass in the RIGHT upper lobe. This does not display increased FDG uptake and would be atypical for metastatic lesion based on appearance and location. Suggest six-month follow-up for further evaluation.   RIGHT thyroid uptake is slightly asymmetric potentially associated with small nodule. Recommend thyroid ultrasound with biopsy of focal abnormality is seen.(Ref: J Am Coll Radiol. 2015 Feb;12(2): 143-50).   03/02/2021 -  Chemotherapy    Patient is on Treatment Plan: PANCREAS MODIFIED FOLFIRINOX Q14D X 4 CYCLES         CURRENT THERAPY: First line FOLFIRINOX q2 weeks, starting 03/02/21, OXali held with C3 due to neuropathy  INTERVAL HISTORY: Ms. Randon presents for follow  up as scheduled. She is s/p cycle 3 FOLFIRINOX on 03/30/21. She developed worsening abdominal  pain and was seen by [name] for symptom management on 8/10. Lipase returned elevated and she was admitted for possible pancreatitis. She had diarrhea and fever x1 in the hospital, lipase normalized overnight. US showed benign hepatic lesions, no other acute abnormality. She was hydrated and managed with IV pain management and able to transition to orals for discharge on 04/07/21.   She presents today by herself.  She has recovered well from the hospital.  Pain is much improved but not quite back to baseline.  She rates it 2/10 today.  She is asking if it is okay to take Norco rather than oxycodone.  Energy and activity back to baseline.  Eating and drinking well on a soft diet.  Mild nausea without vomiting is managed with Zofran, Compazine, and Phenergan.  Bowels moving normally.  Urine color back to normal.  Neuropathy resolved with oxaliplatin break.  Denies fever, chills, cough, chest pain, dyspnea, leg edema or other new concerns.   MEDICAL HISTORY:  Past Medical History:  Diagnosis Date   Asthma    Bipolar disorder ([place])    Diverticulosis    Gestational hypertension    HA (headache)    History of borderline personality disorder    Hypersomnia, persistent 04/29/2013   IBS (irritable bowel syndrome)    Memory loss, short term    [name] 04-03-13    Migraine    Pancreatic cancer ([place])    Reflux esophagitis    Seasonal allergies     SURGICAL HISTORY: Past Surgical History:  Procedure Laterality Date   ABDOMINAL HYSTERECTOMY  2000   BILIARY BRUSHING  01/13/2021   Procedure: BILIARY BRUSHING;  Surgeon: Gatha Mayer, MD;  Location: [facility];  Service: Endoscopy;;   BILIARY DILATION  02/09/2021   Procedure: BILIARY DILATION;  Surgeon: Irving Copas., MD;  Location: [place];  Service: Gastroenterology;;   BILIARY STENT PLACEMENT N/A 01/13/2021   Procedure: BILIARY STENT PLACEMENT;  Surgeon: Gatha Mayer, MD;  Location: [facility];  Service: Endoscopy;   Laterality: N/A;   BILIARY STENT PLACEMENT  02/09/2021   Procedure: BILIARY STENT PLACEMENT;  Surgeon: Irving Copas., MD;  Location: [place];  Service: Gastroenterology;;   BIOPSY  01/13/2021   Procedure: BIOPSY;  Surgeon: Gatha Mayer, MD;  Location: [facility];  Service: Endoscopy;;   BIOPSY  02/09/2021   Procedure: BIOPSY;  Surgeon: Irving Copas., MD;  Location: [facility];  Service: Gastroenterology;;   COLONOSCOPY  01/2020   ERCP N/A 01/13/2021   Procedure: ENDOSCOPIC RETROGRADE CHOLANGIOPANCREATOGRAPHY (ERCP);  Surgeon: Gatha Mayer, MD;  Location: [facility];  Service: Endoscopy;  Laterality: N/A;   ERCP N/A 02/09/2021   Procedure: ENDOSCOPIC RETROGRADE CHOLANGIOPANCREATOGRAPHY (ERCP);  Surgeon: Irving Copas., MD;  Location: [place];  Service: Gastroenterology;  Laterality: N/A;   ESOPHAGOGASTRODUODENOSCOPY (EGD) WITH PROPOFOL N/A 02/09/2021   Procedure: ESOPHAGOGASTRODUODENOSCOPY (EGD) WITH PROPOFOL;  Surgeon: Rush Landmark Telford Nab., MD;  Location: [place];  Service: Gastroenterology;  Laterality: N/A;   EUS N/A 02/09/2021   Procedure: UPPER ENDOSCOPIC ULTRASOUND (EUS) LINEAR;  Surgeon: Irving Copas., MD;  Location: [place];  Service: Gastroenterology;  Laterality: N/A;   FINE NEEDLE ASPIRATION  02/09/2021   Procedure: FINE NEEDLE ASPIRATION (FNA) LINEAR;  Surgeon: Irving Copas., MD;  Location: [place];  Service: Gastroenterology;;   KNEE SURGERY Right 96,98,00,02,05,2014   NASAL SINUS SURGERY  NOSE SURGERY     [id number]   PORTACATH PLACEMENT Right 02/17/2021   Procedure: INSERTION PORT-A-CATH;  Surgeon: Dwan Bolt, MD;  Location: [facility];  Service: General;  Laterality: Right;  LMA ANESTHESIA   REPLACEMENT TOTAL KNEE Right    SPHINCTEROTOMY  01/13/2021   Procedure: SPHINCTEROTOMY;  Surgeon: Gatha Mayer, MD;  Location: [facility];  Service: Endoscopy;;   STENT REMOVAL  02/09/2021    Procedure: STENT REMOVAL;  Surgeon: Irving Copas., MD;  Location: [place];  Service: Gastroenterology;;   WRIST SURGERY Right [phone]    I have reviewed the social history and family history with the patient and they are unchanged from previous note.  ALLERGIES:  is allergic to aspirin, bee venom, penicillin g, penicillins, bactrim [sulfamethoxazole-trimethoprim], coconut oil, sulfa antibiotics, and sulfamethoxazole-trimethoprim.  MEDICATIONS:  Current Outpatient Medications  Medication Sig Dispense Refill   acetaminophen (TYLENOL) 325 MG tablet Take 325-650 mg by mouth every 6 (six) hours as needed for mild pain or headache.     albuterol (VENTOLIN HFA) 108 (90 Base) MCG/ACT inhaler Inhale 2 puffs into the lungs every 6 (six) hours as needed for wheezing or shortness of breath.     cyclobenzaprine (FLEXERIL) 5 MG tablet TAKE 1 TABLET(5 MG) BY MOUTH AT BEDTIME AS NEEDED FOR LOWER BACK PAIN OR SEVERE HEADACHE (Patient taking differently: Take 5 mg by mouth at bedtime as needed (for lower back pain or severe headaches).) 30 tablet 0   diphenoxylate-atropine (LOMOTIL) 2.5-0.025 MG tablet Take 1-2 tabs by mouth 4 times a day as needed for loose stools. 30 tablet 0   EPINEPHrine 0.3 mg/0.3 mL IJ SOAJ injection Inject 0.3 mg into the muscle as needed for anaphylaxis. INJECT 0.3 MLS (0.3 MG TOTAL) INTO THE MUSCLE ONCE (Patient taking differently: Inject 0.3 mg into the muscle once as needed for anaphylaxis.) 1 each 0   fluticasone (FLONASE) 50 MCG/ACT nasal spray Place 2 sprays into both nostrils daily.     gabapentin (NEURONTIN) 300 MG capsule Take 300 mg by mouth 3 (three) times daily as needed (for neuropathic pain).     HYDROcodone-acetaminophen (NORCO/VICODIN) 5-325 MG tablet Take 1 tablet by mouth every 6 (six) hours as needed for moderate pain or severe pain.     lidocaine-prilocaine (EMLA) cream Apply to affected area once (Patient taking differently: Apply 1 application  topically as directed.) 30 g 3   magic mouthwash w/lidocaine SOLN Take 10 mLs by mouth 4 (four) times daily as needed for mouth pain. 10 mls swish and spit by mouth four times daily as needed 470 mL 2   montelukast (SINGULAIR) 10 MG tablet Take 10 mg by mouth daily.     Multiple Vitamin (MULTIVITAMIN WITH MINERALS) TABS tablet Take 1 tablet by mouth daily. 30 tablet 2   ondansetron (ZOFRAN) 8 MG tablet Take 1 tablet (8 mg total) by mouth every 8 (eight) hours as needed for nausea or vomiting. Starting on day 3 after chemotherapy 30 tablet 2   oxyCODONE (OXY IR/ROXICODONE) 5 MG immediate release tablet Take 1-2 tablets (5-10 mg total) by mouth every 6 (six) hours as needed for severe pain. 90 tablet 0   PRESCRIPTION MEDICATION Inject into the vein See admin instructions. FOLFIRINOX- Every 14 days for 8 treatments     prochlorperazine (COMPAZINE) 10 MG tablet Take 1 tablet (10 mg total) by mouth every 6 (six) hours as needed (Nausea or vomiting). (Patient taking differently: Take 10 mg by mouth every 6 (six) hours as needed  for vomiting or nausea.) 30 tablet 1   promethazine (PHENERGAN) 25 MG tablet Take 25 mg by mouth every 6 (six) hours as needed for vomiting or nausea.     LATUDA 40 MG TABS tablet Take 1 tablet (40 mg total) by mouth daily with breakfast. (Patient not taking: No sig reported) 90 tablet 0   lidocaine-prilocaine (EMLA) cream Apply 1 application topically as needed. (Patient not taking: No sig reported) 30 g 2   ondansetron (ZOFRAN) 8 MG tablet Take 1 tablet (8 mg total) by mouth 2 (two) times daily as needed. Start on day 3 after chemotherapy. (Patient not taking: No sig reported) 30 tablet 1   prochlorperazine (COMPAZINE) 10 MG tablet Take 1 tablet (10 mg total) by mouth every 6 (six) hours as needed. (Patient not taking: No sig reported) 30 tablet 2   No current facility-administered medications for this visit.   Facility-Administered Medications Ordered in Other Visits  Medication  Dose Route Frequency Provider Last Rate Last Admin   atropine injection 0.5 mg  0.5 mg Intravenous Once PRN Truitt Merle, MD       fluorouracil (ADRUCIL) 5,000 mg in sodium chloride 0.9 % 150 mL chemo infusion  2,348 mg/m2 (Treatment Plan Recorded) Intravenous 1 day or 1 dose Truitt Merle, MD       irinotecan (CAMPTOSAR) 320 mg in sodium chloride 0.9 % 500 mL chemo infusion  150 mg/m2 (Treatment Plan Recorded) Intravenous Once Truitt Merle, MD       leucovorin 856 mg in sodium chloride 0.9 % 250 mL infusion  400 mg/m2 (Treatment Plan Recorded) Intravenous Once Truitt Merle, MD       oxaliplatin (ELOXATIN) 150 mg in dextrose 5 % 500 mL chemo infusion  70 mg/m2 (Treatment Plan Recorded) Intravenous Once Truitt Merle, MD 265 mL/hr at 04/13/21 1224 150 mg at 04/13/21 1224    PHYSICAL EXAMINATION: ECOG PERFORMANCE STATUS: 1 - Symptomatic but completely ambulatory  Vitals:   04/13/21 1001  BP: 127/86  Pulse: 74  Resp: 18  Temp: 97.6 F (36.4 C)  SpO2: 100%   Filed Weights   04/13/21 1001  Weight: 205 lb 1.6 oz (93 kg)    GENERAL:alert, no distress and comfortable SKIN: No rash EYES: Sclera anicteric LUNGS: clear, normal breathing effort HEART: regular rate & rhythm, no lower extremity edema ABDOMEN:abdomen soft, normal bowel sounds.  Mild epigastric TTP NEURO: alert & oriented x 3 with fluent speech, no focal motor/sensory deficits.  Intact peripheral vibratory sense over the wrists and fingertips per tuning fork exam PAC without erythema  LABORATORY DATA:  I have reviewed the data as listed CBC Latest Ref Rng & Units 04/13/2021 04/06/2021 04/05/2021  WBC 4.0 - 10.5 K/uL 6.7 4.3 5.1  Hemoglobin 12.0 - 15.0 g/dL 11.6(L) 10.8(L) 11.9(L)  Hematocrit 36.0 - 46.0 % 36.0 34.0(L) 36.7  Platelets 150 - 400 K/uL 198 153 172     CMP Latest Ref Rng & Units 04/13/2021 04/06/2021 04/05/2021  Glucose 70 - 99 mg/dL 90 109(H) 102(H)  BUN 6 - 20 mg/dL 15 9 13   Creatinine 0.44 - 1.00 mg/dL 1.08(H) 0.86 0.85   Sodium 135 - 145 mmol/L 141 139 139  Potassium 3.5 - 5.1 mmol/L 4.3 3.8 3.9  Chloride 98 - 111 mmol/L 108 106 107  CO2 22 - 32 mmol/L 25 26 22   Calcium 8.9 - 10.3 mg/dL 9.6 8.9 9.4  Total Protein 6.5 - 8.1 g/dL 7.0 6.0(L) 7.0  Total Bilirubin 0.3 - 1.2 mg/dL 0.5 0.7  0.5  Alkaline Phos 38 - 126 U/L 62 49 63  AST 15 - 41 U/L 31 30 38  ALT 0 - 44 U/L 36 43 55(H)      RADIOGRAPHIC STUDIES: I have personally reviewed the radiological images as listed and agreed with the findings in the report. No results found.   ASSESSMENT & PLAN: Robin [name] is a 55 y.o. female with    1. Adenocarcinoma of the pancreas, cT2N0M1 with properble liver met  -Presented with fatigue, abdominal pain, and jaundice 12/2020 s/p biliary stent x2  -CT 02/08/2021 showed 2.5 cm hypervascular area in pancreatic head adjacent to the common bile duct in direct contact with anterior surface of inferior vena cava, 1.2 cm groundglass right lung nodule, and multiple liver lesions -Baseline CA 19.9 elevated at 72 -EUS showed a masslike region in the pancreatic head where the CBD narrowed with upstream PD dilatation noted.  Endosonographic appearance was suggestive of potential pancreatic adenocarcinoma T2 N0 MX.  Final path showed malignant cells consistent with adenocarcinoma - PET scan showed a hypermetabolic liver lesion in the right lobe, adjacent to right kidney, suspicious for oligo liver metastasis. No other definitive distant mets. This was not visible on ultrasound, Liver biopsy is not feasible   -She began C1 FOLFIRINOX 03/02/21. Her insurance denied prophylactic G-CSF. -s/p cycle 2 she began to develop early neuropathyexali held with cycle 3.  -s/p cycle 3 she developed acute abd pain and nausea, with elevated lipase, concerning for acute pancreatitis. She has was hospitalized for hydration and pain management.  -restaging CT 04/25/21    2. 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 -decreased sensation in hands,  more so on right -She started B complex and oxalic was held with cycle 3 -Neuropathy resolved, added Oxali back 70 mg/m2 with cycle 4   3. Right upper quadrant/epigastric abdominal pain -Patient currently being treated with oxycodone 1-2 tablets per day and tylenol if needed.   4. Family history for cancer  -Maternal great-grandmother, grandmother, and 2 sisters with breast cancer -The patient has 1 biological daughter -declined referral for genetic testing at this time   5. 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 Nantz appears stable, she has recovered from recent hospitalization.  Pain is well controlled.  Hospital course and today's labs reviewed.  Proceed with C4 FOLFIRINOX today as planned, given her neuropathy resolved we will add dose-reduced oxali 70 mg/m2 and monitor neuropathy closely.  She is scheduled for restaging CT CAP 8/30, then F/up and next cycle in 2 weeks   All questions were answered. The patient knows to call the clinic with any problems, questions or concerns. No barriers to learning were detected.     Alla Feeling, NP 04/13/21

## 2021-04-14 LAB — CANCER ANTIGEN 19-9: CA 19-9: 63 U/mL — ABNORMAL HIGH (ref 0–35)

## 2021-04-15 ENCOUNTER — Inpatient Hospital Stay: Payer: 59

## 2021-04-15 ENCOUNTER — Other Ambulatory Visit: Payer: Self-pay

## 2021-04-15 VITALS — BP 139/78 | HR 88 | Temp 97.9°F | Resp 17

## 2021-04-15 DIAGNOSIS — Z5111 Encounter for antineoplastic chemotherapy: Secondary | ICD-10-CM | POA: Diagnosis not present

## 2021-04-15 DIAGNOSIS — C250 Malignant neoplasm of head of pancreas: Secondary | ICD-10-CM

## 2021-04-15 MED ORDER — SODIUM CHLORIDE 0.9% FLUSH
10.0000 mL | INTRAVENOUS | Status: DC | PRN
Start: 2021-04-15 — End: 2021-04-15
  Administered 2021-04-15: 10 mL

## 2021-04-15 MED ORDER — HEPARIN SOD (PORK) LOCK FLUSH 100 UNIT/ML IV SOLN
500.0000 [IU] | Freq: Once | INTRAVENOUS | Status: AC | PRN
  Administered 2021-04-15: 500 [IU]

## 2021-04-25 ENCOUNTER — Encounter (HOSPITAL_COMMUNITY): Payer: Self-pay

## 2021-04-25 ENCOUNTER — Ambulatory Visit (HOSPITAL_COMMUNITY)
Admission: RE | Admit: 2021-04-25 | Discharge: 2021-04-25 | Disposition: A | Discharge status: Home / Self Care | Payer: 59 | Source: Ambulatory Visit | Attending: Hematology | Admitting: Hematology

## 2021-04-25 ENCOUNTER — Other Ambulatory Visit: Payer: Self-pay

## 2021-04-25 DIAGNOSIS — C250 Malignant neoplasm of head of pancreas: Secondary | ICD-10-CM | POA: Diagnosis present

## 2021-04-25 MED ORDER — IOHEXOL 350 MG/ML SOLN
80.0000 mL | Freq: Once | INTRAVENOUS | Status: AC | PRN
  Administered 2021-04-25: 80 mL via INTRAVENOUS

## 2021-04-25 MED ORDER — HEPARIN SOD (PORK) LOCK FLUSH 100 UNIT/ML IV SOLN
INTRAVENOUS | Status: AC
  Filled 2021-04-25: qty 5

## 2021-04-25 MED ORDER — HEPARIN SOD (PORK) LOCK FLUSH 100 UNIT/ML IV SOLN
500.0000 [IU] | Freq: Once | INTRAVENOUS | Status: AC
  Administered 2021-04-25: 500 [IU] via INTRAVENOUS

## 2021-04-26 MED FILL — Fosaprepitant Dimeglumine For IV Infusion 150 MG (Base Eq): INTRAVENOUS | Qty: 5 | Status: AC

## 2021-04-26 MED FILL — Dexamethasone Sodium Phosphate Inj 100 MG/10ML: INTRAMUSCULAR | Qty: 1 | Status: AC

## 2021-04-27 ENCOUNTER — Encounter: Payer: Self-pay | Admitting: Nurse Practitioner

## 2021-04-27 ENCOUNTER — Other Ambulatory Visit: Payer: Self-pay

## 2021-04-27 ENCOUNTER — Inpatient Hospital Stay (HOSPITAL_BASED_OUTPATIENT_CLINIC_OR_DEPARTMENT_OTHER): Payer: 59 | Admitting: Nurse Practitioner

## 2021-04-27 ENCOUNTER — Inpatient Hospital Stay: Payer: 59 | Attending: Physician Assistant

## 2021-04-27 ENCOUNTER — Inpatient Hospital Stay: Payer: 59

## 2021-04-27 VITALS — BP 131/77 | HR 79 | Temp 97.9°F | Resp 18 | Wt 207.3 lb

## 2021-04-27 VITALS — BP 132/90 | HR 65 | Temp 98.2°F | Resp 18

## 2021-04-27 DIAGNOSIS — Z79899 Other long term (current) drug therapy: Secondary | ICD-10-CM | POA: Diagnosis not present

## 2021-04-27 DIAGNOSIS — C250 Malignant neoplasm of head of pancreas: Secondary | ICD-10-CM | POA: Insufficient documentation

## 2021-04-27 DIAGNOSIS — K1379 Other lesions of oral mucosa: Secondary | ICD-10-CM

## 2021-04-27 DIAGNOSIS — C787 Secondary malignant neoplasm of liver and intrahepatic bile duct: Secondary | ICD-10-CM | POA: Diagnosis present

## 2021-04-27 DIAGNOSIS — Z803 Family history of malignant neoplasm of breast: Secondary | ICD-10-CM | POA: Insufficient documentation

## 2021-04-27 DIAGNOSIS — Z23 Encounter for immunization: Secondary | ICD-10-CM | POA: Diagnosis not present

## 2021-04-27 DIAGNOSIS — T451X5A Adverse effect of antineoplastic and immunosuppressive drugs, initial encounter: Secondary | ICD-10-CM | POA: Diagnosis not present

## 2021-04-27 DIAGNOSIS — G620 Drug-induced polyneuropathy: Secondary | ICD-10-CM | POA: Insufficient documentation

## 2021-04-27 DIAGNOSIS — Z5111 Encounter for antineoplastic chemotherapy: Secondary | ICD-10-CM | POA: Insufficient documentation

## 2021-04-27 DIAGNOSIS — Z95828 Presence of other vascular implants and grafts: Secondary | ICD-10-CM

## 2021-04-27 LAB — CMP (CANCER CENTER ONLY)
ALT: 26 U/L (ref 0–44)
AST: 26 U/L (ref 15–41)
Albumin: 3.5 g/dL (ref 3.5–5.0)
Alkaline Phosphatase: 55 U/L (ref 38–126)
Anion gap: 10 (ref 5–15)
BUN: 10 mg/dL (ref 6–20)
CO2: 24 mmol/L (ref 22–32)
Calcium: 9.9 mg/dL (ref 8.9–10.3)
Chloride: 111 mmol/L (ref 98–111)
Creatinine: 1 mg/dL (ref 0.44–1.00)
GFR, Estimated: >60 mL/min (ref >60)
Glucose, Bld: 101 mg/dL — ABNORMAL HIGH (ref 70–99)
Potassium: 4 mmol/L (ref 3.5–5.1)
Sodium: 145 mmol/L (ref 135–145)
Total Bilirubin: 0.4 mg/dL (ref 0.3–1.2)
Total Protein: 6.6 g/dL (ref 6.5–8.1)

## 2021-04-27 LAB — CBC WITH DIFFERENTIAL (CANCER CENTER ONLY)
Abs Immature Granulocytes: 0.01 10*3/uL (ref 0.00–0.07)
Basophils Absolute: 0 10*3/uL (ref 0.0–0.1)
Basophils Relative: 1 %
Eosinophils Absolute: 0.1 10*3/uL (ref 0.0–0.5)
Eosinophils Relative: 3 %
HCT: 34.4 % — ABNORMAL LOW (ref 36.0–46.0)
Hemoglobin: 11 g/dL — ABNORMAL LOW (ref 12.0–15.0)
Immature Granulocytes: 0 %
Lymphocytes Relative: 44 %
Lymphs Abs: 2.6 10*3/uL (ref 0.7–4.0)
MCH: 27.8 pg (ref 26.0–34.0)
MCHC: 32 g/dL (ref 30.0–36.0)
MCV: 87.1 fL (ref 80.0–100.0)
Monocytes Absolute: 0.6 10*3/uL (ref 0.1–1.0)
Monocytes Relative: 10 %
Neutro Abs: 2.4 10*3/uL (ref 1.7–7.7)
Neutrophils Relative %: 42 %
Platelet Count: 207 10*3/uL (ref 150–400)
RBC: 3.95 MIL/uL (ref 3.87–5.11)
RDW: 15.7 % — ABNORMAL HIGH (ref 11.5–15.5)
WBC Count: 5.7 10*3/uL (ref 4.0–10.5)
nRBC: 0 % (ref 0.0–0.2)

## 2021-04-27 MED ORDER — SODIUM CHLORIDE 0.9 % IV SOLN
400.0000 mg/m2 | Freq: Once | INTRAVENOUS | Status: AC
  Administered 2021-04-27: 856 mg via INTRAVENOUS
  Filled 2021-04-27: qty 42.8

## 2021-04-27 MED ORDER — SODIUM CHLORIDE 0.9 % IV SOLN
150.0000 mg/m2 | Freq: Once | INTRAVENOUS | Status: AC
  Administered 2021-04-27: 320 mg via INTRAVENOUS
  Filled 2021-04-27: qty 15

## 2021-04-27 MED ORDER — HEPARIN SOD (PORK) LOCK FLUSH 100 UNIT/ML IV SOLN: 500.0000 [IU] | Freq: Once | INTRAVENOUS | Status: DC | PRN

## 2021-04-27 MED ORDER — SODIUM CHLORIDE 0.9% FLUSH
10.0000 mL | Freq: Once | INTRAVENOUS | Status: AC
  Administered 2021-04-27: 10 mL

## 2021-04-27 MED ORDER — SODIUM CHLORIDE 0.9 % IV SOLN
150.0000 mg | Freq: Once | INTRAVENOUS | Status: AC
  Administered 2021-04-27: 150 mg via INTRAVENOUS
  Filled 2021-04-27: qty 150

## 2021-04-27 MED ORDER — MAGIC MOUTHWASH W/LIDOCAINE: 10.0000 mL | Freq: Four times a day (QID) | ORAL | 2 refills | Status: DC | PRN

## 2021-04-27 MED ORDER — ATROPINE SULFATE 1 MG/ML IJ SOLN
0.5000 mg | Freq: Once | INTRAMUSCULAR | Status: AC | PRN
  Administered 2021-04-27: 0.5 mg via INTRAVENOUS
  Filled 2021-04-27: qty 1

## 2021-04-27 MED ORDER — SODIUM CHLORIDE 0.9% FLUSH: 10.0000 mL | INTRAVENOUS | Status: DC | PRN

## 2021-04-27 MED ORDER — SODIUM CHLORIDE 0.9 % IV SOLN
2348.0000 mg/m2 | INTRAVENOUS | Status: DC
  Administered 2021-04-27: 5000 mg via INTRAVENOUS
  Filled 2021-04-27: qty 100

## 2021-04-27 MED ORDER — PALONOSETRON HCL INJECTION 0.25 MG/5ML
0.2500 mg | Freq: Once | INTRAVENOUS | Status: AC
  Administered 2021-04-27: 0.25 mg via INTRAVENOUS
  Filled 2021-04-27: qty 5

## 2021-04-27 MED ORDER — DEXTROSE 5 % IV SOLN: Freq: Once | INTRAVENOUS | Status: AC

## 2021-04-27 MED ORDER — SODIUM CHLORIDE 0.9 % IV SOLN
10.0000 mg | Freq: Once | INTRAVENOUS | Status: AC
  Administered 2021-04-27: 10 mg via INTRAVENOUS
  Filled 2021-04-27: qty 10

## 2021-04-27 MED ORDER — OXALIPLATIN CHEMO INJECTION 100 MG/20ML
70.0000 mg/m2 | Freq: Once | INTRAVENOUS | Status: AC
  Administered 2021-04-27: 150 mg via INTRAVENOUS
  Filled 2021-04-27: qty 20

## 2021-04-27 NOTE — Progress Notes (Signed)
Robin [name]   [phone] Fax:([phone]) [phone]   Clinic Follow up Note   Patient Care Team: [name], MD as PCP - General (Family Medicine) Jerline Pain, MD as PCP - Cardiology (Cardiology) End, Harrell Gave, MD as Consulting Physician (Cardiology) Alda Berthold, DO as Consulting Physician (Neurology) Heilingoetter, Tobe Sos, PA-C as Physician Assistant (Physician Assistant) Truitt Merle, MD as Consulting Physician (Oncology) Dwan Bolt, MD as Consulting Physician (General Surgery) 04/27/2021  CHIEF COMPLAINT: Follow-up pancreas cancer  SUMMARY OF ONCOLOGIC HISTORY: Oncology History Overview Note  Cancer Staging Pancreatic cancer [facility]) Staging form: Exocrine Pancreas, AJCC 8th Edition - Clinical stage from 02/09/2021: Stage IV (cT2, cN0, cM1) - Signed by Truitt Merle, MD on 03/16/2021 Total positive nodes: 0    Pancreatic cancer ([place])  01/11/2021 Imaging   CT Abdomen/Pelvis  IMPRESSION: Multiple low-attenuation liver lesions, which cannot be characterized on this unenhanced exam. Abdomen MRI without and with contrast is recommended for further characterization.   Diffuse biliary ductal dilatation, with soft tissue prominence in the region of the pancreatic head. Pancreatic mass cannot be excluded on this unenhanced exam. Recommend abdomen MRI and MRCP without and with contrast for further evaluation.   Colonic diverticulosis, without radiographic evidence of diverticulitis.     01/12/2021 Imaging   MRI ABDOMEN WITHOUT AND WITH CONTRAST (INCLUDING MRCP)  IMPRESSION: Biliary duct distension of both intra and extrahepatic biliary tree with abrupt caliber change but with no visible mass on imaging. Caliber change more abrupt than expected for stricture and the biliary tree was top-normal size at 6 mm in 2017. Endoscopic assessment is suggested to exclude an occult biliary lesion or atypical stricture.   Lobulated T2 bright lesion in the  medial segment of the LEFT hepatic lobe that favors a sclerosed hemangioma. There is some internal septation suggested and the filling pattern is slightly atypical. Consider 3 to six-month follow-up with extended delays following contrast to 7 minutes better assess filling characteristics, differential consideration would include a biliary cystadenoma with septal enhancement.   01/13/2021 Procedure   ERCP under the care of Dr. Carlean Purl  IMPRESSION: 1. Dilation of the common bile duct and main pancreatic duct. 2. Sphincterotomy and placement of a plastic biliary stent.    01/13/2021 Pathology Results   CASE: [id number]   A. COMMON BILE DUCT, STRICTURE, #1, BRUSHING:   FINAL MICROSCOPIC DIAGNOSIS:  - Suspicious for malignancy  - See comment    02/08/2021 Imaging   CT CHEST WITHOUT CONTRAST AND CT OF ABDOMEN AND PELVIS WITH CONTRAST  IMPRESSION: 1. Findings are highly suspicious for a hypovascular mass in the posterior aspect of the head of the pancreas, as detailed above. This appears associated with persistent narrowing of the distal common bile duct and abrupt termination of the pancreatic duct. Common bile duct stent appears appropriately located, but is associated with some persistent dilatation of the common bile duct. 2. 1.2 x 0.7 cm ground-glass attenuation nodule in the right upper lobe. Initial follow-up with CT at 6-12 months is recommended to confirm persistence. If persistent, repeat CT is recommended every 2 years until 5 years of stability has been established. This recommendation follows the consensus statement: Guidelines for Management of Incidental Pulmonary Nodules Detected on CT Images: From the Fleischner Society 2017; Radiology 2017; 284:228-243. 3. Multiple liver lesions, smallest of which likely represent tiny simple cysts. The larger lesion in the left lobe of the liver has imaging characteristics once again most suggestive of a  cavernous hemangioma. 4.  Aortic atherosclerosis, in addition to left anterior descending coronary artery disease. Please note that although the presence of coronary artery calcium documents the presence of coronary artery disease, the severity of this disease and any potential stenosis cannot be assessed on this non-gated CT examination. Assessment for potential risk factor modification, dietary therapy or pharmacologic therapy may be warranted, if clinically indicated. 5. Additional incidental findings, as above.    02/09/2021 Procedure   ERCP under the care of Dr. Rush Landmark   IMPRESSION: Limited images during ERCP demonstrates removal plastic biliary stent and placement of a new metallic biliary stent. Please refer to the dictated operative report for full details of intraoperative findings and procedure.   02/09/2021 Pathology Results   CASE: [id number]  FINAL MICROSCOPIC DIAGNOSIS:  A. PANCREAS, HEAD OF LESION, FINE NEEDLE ASPIRATION:  - Malignant cells consistent with adenocarcinoma    02/09/2021 Procedure   EGD Impression: - No gross lesions in esophagus. Z-line regular, 40 cm from the incisors. - Severe gastritis noted. No other gross lesions in the stomach. Biopsied. - Plastic biliary stent in the duodenum. Removed. Revealed a patent biliary sphincterotomy was found. - No gross lesions in the duodenal bulb, in the first portion of the duodenum and in the second portion of the duodenum. EUS Impression: - Hyperechoic material consistent with sludge was visualized endosonographically in the gallbladder. - There was a suggestion of a stricture in the lower third of the main bile duct leading to dilation in the middle third of the main bile duct and in the upper third of the main bile duct with evidence of hyperechoic material consistent with sludge within the common bile duct. - A mass-like region was identified in the pancreatic head where the CBD narrowed and where the  upstream PD dilation was noted. This is not the most confluent of masses however. Cytology results are pending. However, the endosonographic appearance is suggestive of potential pancreatic adenocarcinoma. This was staged T2 N0 Mx by endosonographic criteria. The staging applies if malignancy is confirmed. Fine needle biopsy performed. - No malignant-appearing lymph nodes were visualized in the celiac region (level 20), peripancreatic region and porta hepatis region.   02/09/2021 Cancer Staging   Staging form: Exocrine Pancreas, AJCC 8th Edition - Clinical stage from 02/09/2021: Stage IV (cT2, cN0, cM1) - Signed by Truitt Merle, MD on 03/16/2021 Total positive nodes: 0   02/13/2021 Initial Diagnosis   Pancreatic cancer ([place])   02/28/2021 Imaging   PET  Signs of pancreatic head mass with suspected hepatic metastatic lesion in the RIGHT hepatic lobe as described.   Equivocal uptake in adjacent lymph nodes, potentially reactive in the setting of biliary stent. Note the biliary stent placement is in the mid to distal common bile duct but appears similar to recent ERCP images.   Stable appearance of ground-glass in the RIGHT upper lobe. This does not display increased FDG uptake and would be atypical for metastatic lesion based on appearance and location. Suggest six-month follow-up for further evaluation.   RIGHT thyroid uptake is slightly asymmetric potentially associated with small nodule. Recommend thyroid ultrasound with biopsy of focal abnormality is seen.(Ref: J Am Coll Radiol. 2015 Feb;12(2): 143-50).   03/02/2021 -  Chemotherapy    Patient is on Treatment Plan: PANCREAS MODIFIED FOLFIRINOX Q14D X 4 CYCLES       04/25/2021 Imaging   CT AP w contrast IMPRESSION: 1. No evidence of pancreatic carcinoma progression by CT imaging. 2. Hypermetabolic lesions on comparison PET scan within the head of  the pancreas and liver were essentially occult by CT imaging. Consider follow-up FDG PET  scan at some future point. 3. Multiple small benign-appearing hypodense lesions in the liver and benign hepatic hemangioma unchanged from prior. 4. No evidence of lymphadenopathy     CURRENT THERAPY: First line FOLFIRINOX q2 weeks, starting 03/02/21, OXali held with C3 due to neuropathy. Dose reduced to 70 mg/m2 added back with C4  INTERVAL HISTORY: Robin [name] returns for follow-up and treatment as scheduled.  She completed cycle 4 FOLFIRINOX on 04/13/2021, dose-reduced oxali was added back.  She underwent a restaging CT scan on 04/25/2021.  This cycle was "rough," with side effects beginning sooner and more intense.  Diarrhea began within an hour of getting him from chemo, had 2-3 episodes per day for 2 days.  Imodium helped.  She had mild nausea no vomiting, managed with medication.  She does not eat much the days after treatment but is able to eat more on her week off.  She had 2 mouth sores that slowed her oral intake, she used her home made baking soda rinse.  Fatigue fluctuates, she continues trying to work.  Cold sensitivity lasted 5-6 days, with some neuropathy in her fingertips 1 or 2 times but "not bad."  Denies residual neuropathy in the absence of cold exposure.  Overall she recovered 3-4 days after pump DC.  All other systems were reviewed with the patient and are negative.  MEDICAL HISTORY:  Past Medical History:  Diagnosis Date   Asthma    Bipolar disorder ([place])    Diverticulosis    Gestational hypertension    HA (headache)    History of borderline personality disorder    Hypersomnia, persistent 04/29/2013   IBS (irritable bowel syndrome)    Memory loss, short term    [name] 04-03-13    Migraine    Pancreatic cancer ([place])    Reflux esophagitis    Seasonal allergies     SURGICAL HISTORY: Past Surgical History:  Procedure Laterality Date   ABDOMINAL HYSTERECTOMY  2000   BILIARY BRUSHING  01/13/2021   Procedure: BILIARY BRUSHING;  Surgeon: Gatha Mayer, MD;  Location:  [facility];  Service: Endoscopy;;   BILIARY DILATION  02/09/2021   Procedure: BILIARY DILATION;  Surgeon: Irving Copas., MD;  Location: [place];  Service: Gastroenterology;;   BILIARY STENT PLACEMENT N/A 01/13/2021   Procedure: BILIARY STENT PLACEMENT;  Surgeon: Gatha Mayer, MD;  Location: [facility];  Service: Endoscopy;  Laterality: N/A;   BILIARY STENT PLACEMENT  02/09/2021   Procedure: BILIARY STENT PLACEMENT;  Surgeon: Irving Copas., MD;  Location: [place];  Service: Gastroenterology;;   BIOPSY  01/13/2021   Procedure: BIOPSY;  Surgeon: Gatha Mayer, MD;  Location: [facility];  Service: Endoscopy;;   BIOPSY  02/09/2021   Procedure: BIOPSY;  Surgeon: Irving Copas., MD;  Location: [facility];  Service: Gastroenterology;;   COLONOSCOPY  01/2020   ERCP N/A 01/13/2021   Procedure: ENDOSCOPIC RETROGRADE CHOLANGIOPANCREATOGRAPHY (ERCP);  Surgeon: Gatha Mayer, MD;  Location: [facility];  Service: Endoscopy;  Laterality: N/A;   ERCP N/A 02/09/2021   Procedure: ENDOSCOPIC RETROGRADE CHOLANGIOPANCREATOGRAPHY (ERCP);  Surgeon: Irving Copas., MD;  Location: [place];  Service: Gastroenterology;  Laterality: N/A;   ESOPHAGOGASTRODUODENOSCOPY (EGD) WITH PROPOFOL N/A 02/09/2021   Procedure: ESOPHAGOGASTRODUODENOSCOPY (EGD) WITH PROPOFOL;  Surgeon: Rush Landmark Telford Nab., MD;  Location: [place];  Service: Gastroenterology;  Laterality: N/A;   EUS N/A 02/09/2021   Procedure: UPPER  ENDOSCOPIC ULTRASOUND (EUS) LINEAR;  Surgeon: Rush Landmark Telford Nab., MD;  Location: [place];  Service: Gastroenterology;  Laterality: N/A;   FINE NEEDLE ASPIRATION  02/09/2021   Procedure: FINE NEEDLE ASPIRATION (FNA) LINEAR;  Surgeon: Irving Copas., MD;  Location: [place];  Service: Gastroenterology;;   KNEE SURGERY Right 96,98,00,02,05,2014   NASAL SINUS SURGERY     NOSE SURGERY     [phone]   PORTACATH PLACEMENT Right  02/17/2021   Procedure: INSERTION PORT-A-CATH;  Surgeon: Dwan Bolt, MD;  Location: [facility];  Service: General;  Laterality: Right;  LMA ANESTHESIA   REPLACEMENT TOTAL KNEE Right    SPHINCTEROTOMY  01/13/2021   Procedure: SPHINCTEROTOMY;  Surgeon: Gatha Mayer, MD;  Location: [facility];  Service: Endoscopy;;   STENT REMOVAL  02/09/2021   Procedure: Lavell Islam REMOVAL;  Surgeon: Irving Copas., MD;  Location: [place];  Service: Gastroenterology;;   WRIST SURGERY Right [phone]    I have reviewed the social history and family history with the patient and they are unchanged from previous note.  ALLERGIES:  is allergic to aspirin, bee venom, penicillin g, penicillins, bactrim [sulfamethoxazole-trimethoprim], coconut oil, sulfa antibiotics, and sulfamethoxazole-trimethoprim.  MEDICATIONS:  Current Outpatient Medications  Medication Sig Dispense Refill   acetaminophen (TYLENOL) 325 MG tablet Take 325-650 mg by mouth every 6 (six) hours as needed for mild pain or headache.     albuterol (VENTOLIN HFA) 108 (90 Base) MCG/ACT inhaler Inhale 2 puffs into the lungs every 6 (six) hours as needed for wheezing or shortness of breath.     cyclobenzaprine (FLEXERIL) 5 MG tablet TAKE 1 TABLET(5 MG) BY MOUTH AT BEDTIME AS NEEDED FOR LOWER BACK PAIN OR SEVERE HEADACHE (Patient taking differently: Take 5 mg by mouth at bedtime as needed (for lower back pain or severe headaches).) 30 tablet 0   diphenoxylate-atropine (LOMOTIL) 2.5-0.025 MG tablet Take 1-2 tabs by mouth 4 times a day as needed for loose stools. 30 tablet 0   EPINEPHrine 0.3 mg/0.3 mL IJ SOAJ injection Inject 0.3 mg into the muscle as needed for anaphylaxis. INJECT 0.3 MLS (0.3 MG TOTAL) INTO THE MUSCLE ONCE (Patient taking differently: Inject 0.3 mg into the muscle once as needed for anaphylaxis.) 1 each 0   fluticasone (FLONASE) 50 MCG/ACT nasal spray Place 2 sprays into both nostrils daily.     gabapentin (NEURONTIN) 300 MG  capsule Take 300 mg by mouth 3 (three) times daily as needed (for neuropathic pain).     HYDROcodone-acetaminophen (NORCO/VICODIN) 5-325 MG tablet Take 1 tablet by mouth every 6 (six) hours as needed for moderate pain or severe pain.     LATUDA 40 MG TABS tablet Take 1 tablet (40 mg total) by mouth daily with breakfast. (Patient not taking: No sig reported) 90 tablet 0   lidocaine-prilocaine (EMLA) cream Apply 1 application topically as needed. (Patient not taking: No sig reported) 30 g 2   lidocaine-prilocaine (EMLA) cream Apply to affected area once (Patient taking differently: Apply 1 application topically as directed.) 30 g 3   magic mouthwash w/lidocaine SOLN Take 10 mLs by mouth 4 (four) times daily as needed for mouth pain. 10 mls swish and spit by mouth four times daily as needed 470 mL 2   montelukast (SINGULAIR) 10 MG tablet Take 10 mg by mouth daily.     Multiple Vitamin (MULTIVITAMIN WITH MINERALS) TABS tablet Take 1 tablet by mouth daily. 30 tablet 2   ondansetron (ZOFRAN) 8 MG tablet Take 1 tablet (8 mg  total) by mouth every 8 (eight) hours as needed for nausea or vomiting. Starting on day 3 after chemotherapy 30 tablet 2   ondansetron (ZOFRAN) 8 MG tablet Take 1 tablet (8 mg total) by mouth 2 (two) times daily as needed. Start on day 3 after chemotherapy. (Patient not taking: No sig reported) 30 tablet 1   oxyCODONE (OXY IR/ROXICODONE) 5 MG immediate release tablet Take 1-2 tablets (5-10 mg total) by mouth every 6 (six) hours as needed for severe pain. 90 tablet 0   PRESCRIPTION MEDICATION Inject into the vein See admin instructions. FOLFIRINOX- Every 14 days for 8 treatments     prochlorperazine (COMPAZINE) 10 MG tablet Take 1 tablet (10 mg total) by mouth every 6 (six) hours as needed. (Patient not taking: No sig reported) 30 tablet 2   prochlorperazine (COMPAZINE) 10 MG tablet Take 1 tablet (10 mg total) by mouth every 6 (six) hours as needed (Nausea or vomiting). (Patient taking  differently: Take 10 mg by mouth every 6 (six) hours as needed for vomiting or nausea.) 30 tablet 1   promethazine (PHENERGAN) 25 MG tablet Take 25 mg by mouth every 6 (six) hours as needed for vomiting or nausea.     No current facility-administered medications for this visit.   Facility-Administered Medications Ordered in Other Visits  Medication Dose Route Frequency Provider Last Rate Last Admin   atropine injection 0.5 mg  0.5 mg Intravenous Once PRN Truitt Merle, MD       fluorouracil (ADRUCIL) 5,000 mg in sodium chloride 0.9 % 150 mL chemo infusion  2,348 mg/m2 (Treatment Plan Recorded) Intravenous 1 day or 1 dose Truitt Merle, MD       heparin lock flush 100 unit/mL  500 Units Intracatheter Once PRN Truitt Merle, MD       irinotecan (CAMPTOSAR) 320 mg in sodium chloride 0.9 % 500 mL chemo infusion  150 mg/m2 (Treatment Plan Recorded) Intravenous Once Truitt Merle, MD       leucovorin 856 mg in sodium chloride 0.9 % 250 mL infusion  400 mg/m2 (Treatment Plan Recorded) Intravenous Once Truitt Merle, MD       oxaliplatin (ELOXATIN) 150 mg in dextrose 5 % 500 mL chemo infusion  70 mg/m2 (Treatment Plan Recorded) Intravenous Once Truitt Merle, MD 265 mL/hr at 04/27/21 1114 150 mg at 04/27/21 1114   sodium chloride flush (NS) 0.9 % injection 10 mL  10 mL Intracatheter PRN Truitt Merle, MD        PHYSICAL EXAMINATION: ECOG PERFORMANCE STATUS: 1 - Symptomatic but completely ambulatory  Vitals:   04/27/21 0918  BP: 131/77  Pulse: 79  Resp: 18  Temp: 97.9 F (36.6 C)  SpO2: 100%   Filed Weights   04/27/21 0918  Weight: 207 lb 5 oz (94 kg)    GENERAL:alert, no distress and comfortable SKIN: No rash EYES: sclera clear OROPHARYNX: No thrush or ulcers.  White patch to right buccal mucosa NECK: Without mass LYMPH:  no palpable cervical or supraclavicular lymphadenopathy LUNGS: clear with normal breathing effort HEART: regular rate & rhythm, no lower extremity edema ABDOMEN:abdomen soft, non-tender and  normal bowel sounds NEURO: alert & oriented x 3 with fluent speech, no focal motor deficits PAC without erythema  LABORATORY DATA:  I have reviewed the data as listed CBC Latest Ref Rng & Units 04/27/2021 04/13/2021 04/06/2021  WBC 4.0 - 10.5 K/uL 5.7 6.7 4.3  Hemoglobin 12.0 - 15.0 g/dL 11.0(L) 11.6(L) 10.8(L)  Hematocrit 36.0 - 46.0 % 34.4(L) 36.0  34.0(L)  Platelets 150 - 400 K/uL 207 198 153     CMP Latest Ref Rng & Units 04/27/2021 04/13/2021 04/06/2021  Glucose 70 - 99 mg/dL 101(H) 90 109(H)  BUN 6 - 20 mg/dL 10 15 9   Creatinine 0.44 - 1.00 mg/dL 1.00 1.08(H) 0.86  Sodium 135 - 145 mmol/L 145 141 139  Potassium 3.5 - 5.1 mmol/L 4.0 4.3 3.8  Chloride 98 - 111 mmol/L 111 108 106  CO2 22 - 32 mmol/L 24 25 26   Calcium 8.9 - 10.3 mg/dL 9.9 9.6 8.9  Total Protein 6.5 - 8.1 g/dL 6.6 7.0 6.0(L)  Total Bilirubin 0.3 - 1.2 mg/dL 0.4 0.5 0.7  Alkaline Phos 38 - 126 U/L 55 62 49  AST 15 - 41 U/L 26 31 30   ALT 0 - 44 U/L 26 36 43      RADIOGRAPHIC STUDIES: I have personally reviewed the radiological images as listed and agreed with the findings in the report. No results found.   ASSESSMENT & PLAN: Robin [name] is a 55 y.o. female with    1. Adenocarcinoma of the pancreas, cT2N0M1 with properble liver met  -Presented with fatigue, abdominal pain, and jaundice 12/2020 s/p biliary stent x2  -CT 02/08/2021 showed 2.5 cm hypervascular area in pancreatic head adjacent to the common bile duct in direct contact with anterior surface of inferior vena cava, 1.2 cm groundglass right lung nodule, and multiple liver lesions -Baseline CA 19.9 elevated at 72 -EUS showed a masslike region in the pancreatic head where the CBD narrowed with upstream PD dilatation noted.  Endosonographic appearance was suggestive of potential pancreatic adenocarcinoma T2 N0 MX.  Final path showed malignant cells consistent with adenocarcinoma - PET scan showed a hypermetabolic liver lesion in the right lobe, adjacent to right  kidney, suspicious for oligo liver metastasis. No other definitive distant mets. This was not visible on ultrasound, Liver biopsy is not feasible   -She began C1 FOLFIRINOX 03/02/21. Her insurance denied prophylactic G-CSF. -s/p cycle 2 she began to develop early neuropathyexali held with cycle 3.  -s/p cycle 3 she developed acute abd pain and nausea, with elevated lipase, concerning for acute pancreatitis. She has was hospitalized for hydration and pain management.  -She recovered well, neuropathy resolved.  Able to resume chemo with cycle 4 FOLFIRINOX and dose reduced oxaliplatin on 04/13/2021 -restaging CT 04/25/21 showed the hypermetabolic lesions on comparison PET within the head of pancreas and liver were essentially called by CT, stable benign-appearing liver lesions, no progression of the pancreatic mass or adenopathy. -CA 19-9 improving on chemo.  Overall consistent with a good treatment response   2. 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 -decreased sensation in hands, more so on right -She started B complex and oxalic was held with cycle 3 -Neuropathy resolved, added Oxali back 70 mg/m2 with cycle 4 -She had some episodes of neuropathy with cold exposure only   3. Right upper quadrant/epigastric abdominal pain -Patient currently being treated with oxycodone 1-2 tablets per day and tylenol if needed. -Denies new or worsening pain   4. Family history for cancer  -Maternal great-grandmother, grandmother, and 2 sisters with breast cancer -The patient has 1 biological daughter -declined referral for genetic testing at this time   5. 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  She completed 4 cycles of FOLFIRINOX with dose reduced  oxaliplatin.  She tolerates moderately well with mild nausea, diarrhea, mucositis, fatigue, and cold sensitivity.  Side effects are well managed with supportive care at home.  She is able to recover well after a week.    I personally reviewed her restaging CT and discussed findings with [name] and the patient today.  Scan shows similar fullness in the pancreatic head but no measurable discrete mass, no adenopathy, and stable liver lesions.  The hypermetabolic lesions on last PET scan within the [facility] and liver were occult by CT. CA 19-9 has improved on chemo.  Overall she has had a good response to treatment.  The plan is to continue FOLFIRINOX.  Labs reviewed, adequate to proceed with cycle 5 FOLFIRINOX today at current dose.  If she has worsening diarrhea, neuropathy, or other toxicities we will dose reduce.  We briefly discussed likelihood of de-escalating chemo in the future.  All questions were answered. The patient knows to call the clinic with any problems, questions or concerns. No barriers to learning were detected.  Total encounter time is 30 minutes.     Alla Feeling, NP 04/27/21

## 2021-04-27 NOTE — Patient Instructions (Signed)
[facility] ONCOLOGY  Discharge Instructions: Thank you for choosing [facility] to provide your oncology and hematology care.   If you have a lab appointment with the [facility], please go directly to the [facility] and check in at the registration area.   Wear comfortable clothing and clothing appropriate for easy access to any Portacath or PICC line.   We strive to give you quality time with your provider. You may need to reschedule your appointment if you arrive late (15 or more minutes).  Arriving late affects you and other patients whose appointments are after yours.  Also, if you miss three or more appointments without notifying the office, you may be dismissed from the clinic at the provider's discretion.      For prescription refill requests, have your pharmacy contact our office and allow 72 hours for refills to be completed.    Today you received the following chemotherapy and/or immunotherapy agents: Oxaliplatin, Leucovorin, Irinotecan, Fluorouracil   To help prevent nausea and vomiting after your treatment, we encourage you to take your nausea medication as directed.  BELOW ARE SYMPTOMS THAT SHOULD BE REPORTED IMMEDIATELY: *FEVER GREATER THAN 100.4 F (38 C) OR HIGHER *CHILLS OR SWEATING *NAUSEA AND VOMITING THAT IS NOT CONTROLLED WITH YOUR NAUSEA MEDICATION *UNUSUAL SHORTNESS OF BREATH *UNUSUAL BRUISING OR BLEEDING *URINARY PROBLEMS (pain or burning when urinating, or frequent urination) *BOWEL PROBLEMS (unusual diarrhea, constipation, pain near the anus) TENDERNESS IN MOUTH AND THROAT WITH OR WITHOUT PRESENCE OF ULCERS (sore throat, sores in mouth, or a toothache) UNUSUAL RASH, SWELLING OR PAIN  UNUSUAL VAGINAL DISCHARGE OR ITCHING   Items with * indicate a potential emergency and should be followed up as soon as possible or go to the Emergency Department if any problems should occur.  Please show the CHEMOTHERAPY ALERT CARD or  IMMUNOTHERAPY ALERT CARD at check-in to the Emergency Department and triage nurse.  Should you have questions after your visit or need to cancel or reschedule your appointment, please contact [facility]  Dept: [phone]  and follow the prompts.  Office hours are 8:00 a.m. to 4:30 p.m. Monday - Friday. Please note that voicemails left after 4:00 p.m. may not be returned until the following business day.  We are closed weekends and major holidays. You have access to a nurse at all times for urgent questions. Please call the main number to the clinic Dept: [phone] and follow the prompts.   For any non-urgent questions, you may also contact your provider using MyChart. We now offer e-Visits for anyone 91 and older to request care online for non-urgent symptoms. For details visit [email or web address].   Also download the MyChart app! Go to the app store, search "MyChart", open the app, select [facility], and log in with your MyChart username and password.  Due to Covid, a mask is required upon entering the hospital/clinic. If you do not have a mask, one will be given to you upon arrival. For doctor visits, patients may have 1 support person aged 83 or older with them. For treatment visits, patients cannot have anyone with them due to current Covid guidelines and our immunocompromised population.

## 2021-04-28 ENCOUNTER — Telehealth: Payer: Self-pay | Admitting: Hematology

## 2021-04-28 NOTE — Telephone Encounter (Signed)
Scheduled follow-up appointments per 9/1 los. Patient is aware. 

## 2021-04-29 ENCOUNTER — Other Ambulatory Visit: Payer: Self-pay

## 2021-04-29 ENCOUNTER — Inpatient Hospital Stay: Payer: 59

## 2021-04-29 VITALS — BP 119/68 | HR 68 | Temp 97.0°F | Resp 20

## 2021-04-29 DIAGNOSIS — Z5111 Encounter for antineoplastic chemotherapy: Secondary | ICD-10-CM | POA: Diagnosis not present

## 2021-04-29 DIAGNOSIS — C250 Malignant neoplasm of head of pancreas: Secondary | ICD-10-CM

## 2021-04-29 MED ORDER — SODIUM CHLORIDE 0.9% FLUSH
10.0000 mL | INTRAVENOUS | Status: DC | PRN
  Administered 2021-04-29: 10 mL

## 2021-04-29 MED ORDER — HEPARIN SOD (PORK) LOCK FLUSH 100 UNIT/ML IV SOLN
500.0000 [IU] | Freq: Once | INTRAVENOUS | Status: AC | PRN
  Administered 2021-04-29: 500 [IU]

## 2021-05-10 MED FILL — Dexamethasone Sodium Phosphate Inj 100 MG/10ML: INTRAMUSCULAR | Qty: 1 | Status: AC

## 2021-05-10 MED FILL — Fosaprepitant Dimeglumine For IV Infusion 150 MG (Base Eq): INTRAVENOUS | Qty: 5 | Status: AC

## 2021-05-11 ENCOUNTER — Inpatient Hospital Stay (HOSPITAL_BASED_OUTPATIENT_CLINIC_OR_DEPARTMENT_OTHER): Payer: 59 | Admitting: Nurse Practitioner

## 2021-05-11 ENCOUNTER — Inpatient Hospital Stay: Payer: 59

## 2021-05-11 ENCOUNTER — Encounter: Payer: Self-pay | Admitting: Nurse Practitioner

## 2021-05-11 ENCOUNTER — Other Ambulatory Visit: Payer: Self-pay

## 2021-05-11 VITALS — BP 141/77 | HR 70 | Temp 98.6°F | Resp 18 | Ht 69.5 in | Wt 206.0 lb

## 2021-05-11 DIAGNOSIS — Z5111 Encounter for antineoplastic chemotherapy: Secondary | ICD-10-CM | POA: Diagnosis not present

## 2021-05-11 DIAGNOSIS — Z95828 Presence of other vascular implants and grafts: Secondary | ICD-10-CM

## 2021-05-11 DIAGNOSIS — C250 Malignant neoplasm of head of pancreas: Secondary | ICD-10-CM | POA: Diagnosis not present

## 2021-05-11 DIAGNOSIS — Z23 Encounter for immunization: Secondary | ICD-10-CM

## 2021-05-11 LAB — CMP (CANCER CENTER ONLY)
ALT: 67 U/L — ABNORMAL HIGH (ref 0–44)
AST: 46 U/L — ABNORMAL HIGH (ref 15–41)
Albumin: 3.8 g/dL (ref 3.5–5.0)
Alkaline Phosphatase: 65 U/L (ref 38–126)
Anion gap: 8 (ref 5–15)
BUN: 12 mg/dL (ref 6–20)
CO2: 24 mmol/L (ref 22–32)
Calcium: 9.8 mg/dL (ref 8.9–10.3)
Chloride: 109 mmol/L (ref 98–111)
Creatinine: 0.91 mg/dL (ref 0.44–1.00)
GFR, Estimated: >60 mL/min (ref >60)
Glucose, Bld: 89 mg/dL (ref 70–99)
Potassium: 4.1 mmol/L (ref 3.5–5.1)
Sodium: 141 mmol/L (ref 135–145)
Total Bilirubin: 0.6 mg/dL (ref 0.3–1.2)
Total Protein: 7.2 g/dL (ref 6.5–8.1)

## 2021-05-11 LAB — CBC WITH DIFFERENTIAL (CANCER CENTER ONLY)
Abs Immature Granulocytes: 0.01 10*3/uL (ref 0.00–0.07)
Basophils Absolute: 0 10*3/uL (ref 0.0–0.1)
Basophils Relative: 0 %
Eosinophils Absolute: 0.1 10*3/uL (ref 0.0–0.5)
Eosinophils Relative: 1 %
HCT: 37.2 % (ref 36.0–46.0)
Hemoglobin: 11.9 g/dL — ABNORMAL LOW (ref 12.0–15.0)
Immature Granulocytes: 0 %
Lymphocytes Relative: 41 %
Lymphs Abs: 2.4 10*3/uL (ref 0.7–4.0)
MCH: 28.1 pg (ref 26.0–34.0)
MCHC: 32 g/dL (ref 30.0–36.0)
MCV: 87.9 fL (ref 80.0–100.0)
Monocytes Absolute: 0.6 10*3/uL (ref 0.1–1.0)
Monocytes Relative: 11 %
Neutro Abs: 2.7 10*3/uL (ref 1.7–7.7)
Neutrophils Relative %: 47 %
Platelet Count: 163 10*3/uL (ref 150–400)
RBC: 4.23 MIL/uL (ref 3.87–5.11)
RDW: 16.6 % — ABNORMAL HIGH (ref 11.5–15.5)
WBC Count: 5.7 10*3/uL (ref 4.0–10.5)
nRBC: 0 % (ref 0.0–0.2)

## 2021-05-11 MED ORDER — SODIUM CHLORIDE 0.9 % IV SOLN
130.0000 mg/m2 | Freq: Once | INTRAVENOUS | Status: AC
  Administered 2021-05-11: 280 mg via INTRAVENOUS
  Filled 2021-05-11: qty 14

## 2021-05-11 MED ORDER — SODIUM CHLORIDE 0.9 % IV SOLN
2348.0000 mg/m2 | INTRAVENOUS | Status: DC
  Administered 2021-05-11: 5000 mg via INTRAVENOUS
  Filled 2021-05-11: qty 100

## 2021-05-11 MED ORDER — DICYCLOMINE HCL 10 MG PO CAPS: 10.0000 mg | ORAL_CAPSULE | Freq: Three times a day (TID) | ORAL | 1 refills | Status: DC

## 2021-05-11 MED ORDER — DEXTROSE 5 % IV SOLN: Freq: Once | INTRAVENOUS | Status: DC

## 2021-05-11 MED ORDER — OXALIPLATIN CHEMO INJECTION 100 MG/20ML
70.0000 mg/m2 | Freq: Once | INTRAVENOUS | Status: AC
  Administered 2021-05-11: 150 mg via INTRAVENOUS
  Filled 2021-05-11: qty 10

## 2021-05-11 MED ORDER — SODIUM CHLORIDE 0.9 % IV SOLN
150.0000 mg | Freq: Once | INTRAVENOUS | Status: AC
  Administered 2021-05-11: 150 mg via INTRAVENOUS
  Filled 2021-05-11: qty 150

## 2021-05-11 MED ORDER — SODIUM CHLORIDE 0.9% FLUSH
10.0000 mL | Freq: Once | INTRAVENOUS | Status: AC
  Administered 2021-05-11: 10 mL

## 2021-05-11 MED ORDER — DEXTROSE 5 % IV SOLN: Freq: Once | INTRAVENOUS | Status: AC

## 2021-05-11 MED ORDER — SODIUM CHLORIDE 0.9 % IV SOLN
10.0000 mg | Freq: Once | INTRAVENOUS | Status: AC
  Administered 2021-05-11: 10 mg via INTRAVENOUS
  Filled 2021-05-11: qty 10

## 2021-05-11 MED ORDER — ATROPINE SULFATE 1 MG/ML IJ SOLN
0.5000 mg | Freq: Once | INTRAMUSCULAR | Status: AC | PRN
  Administered 2021-05-11: 0.5 mg via INTRAVENOUS
  Filled 2021-05-11: qty 1

## 2021-05-11 MED ORDER — LEUCOVORIN CALCIUM INJECTION 350 MG
400.0000 mg/m2 | Freq: Once | INTRAMUSCULAR | Status: AC
  Administered 2021-05-11: 856 mg via INTRAVENOUS
  Filled 2021-05-11: qty 42.8

## 2021-05-11 MED ORDER — INFLUENZA VAC SPLIT QUAD 0.5 ML IM SUSY
0.5000 mL | PREFILLED_SYRINGE | Freq: Once | INTRAMUSCULAR | Status: AC
  Administered 2021-05-11: 0.5 mL via INTRAMUSCULAR
  Filled 2021-05-11: qty 0.5

## 2021-05-11 MED ORDER — PALONOSETRON HCL INJECTION 0.25 MG/5ML
0.2500 mg | Freq: Once | INTRAVENOUS | Status: AC
  Administered 2021-05-11: 0.25 mg via INTRAVENOUS
  Filled 2021-05-11: qty 5

## 2021-05-11 NOTE — Progress Notes (Signed)
Per [name], okay to increase rate of 5FU pump to administer over 43 hours instead of 46 hours (to complete at approximately 1300 on Saturday 05/13/2021). New rate will be 5.81 mL/hr.

## 2021-05-11 NOTE — Patient Instructions (Addendum)
[facility] ONCOLOG[facility]  Discharge Instructions: Thank you for choosing [facility] to provide your oncology and hematology care.   If you have a lab appointment with the [facility], please go directly to the [facility] and check in at the registration area.   Wear comfortable clothing and clothing appropriate for easy access to any Portacath or PICC line.   We strive to give you quality time with your provider. You may need to reschedule your appointment if you arrive late (15 or more minutes).  Arriving late affects you and other patients whose appointments are after yours.  Also, if you miss three or more appointments without notifying the office, you may be dismissed from the clinic at the provider's discretion.      For prescription refill requests, have your pharmacy contact our office and allow 72 hours for refills to be completed.    Today you received the following chemotherapy and/or immunotherapy agents: Oxaliplatin, Irinotecan, Leucovorin, & Fluorouracil    To help prevent nausea and vomiting after your treatment, we encourage you to take your nausea medication as directed.  BELOW ARE SYMPTOMS THAT SHOULD BE REPORTED IMMEDIATELY: *FEVER GREATER THAN 100.4 F (38 C) OR HIGHER *CHILLS OR SWEATING *NAUSEA AND VOMITING THAT IS NOT CONTROLLED WITH YOUR NAUSEA MEDICATION *UNUSUAL SHORTNESS OF BREATH *UNUSUAL BRUISING OR BLEEDING *URINARY PROBLEMS (pain or burning when urinating, or frequent urination) *BOWEL PROBLEMS (unusual diarrhea, constipation, pain near the anus) TENDERNESS IN MOUTH AND THROAT WITH OR WITHOUT PRESENCE OF ULCERS (sore throat, sores in mouth, or a toothache) UNUSUAL RASH, SWELLING OR PAIN  UNUSUAL VAGINAL DISCHARGE OR ITCHING   Items with * indicate a potential emergency and should be followed up as soon as possible or go to the Emergency Department if any problems should occur.  Please show the CHEMOTHERAPY ALERT CARD or  IMMUNOTHERAPY ALERT CARD at check-in to the Emergency Department and triage nurse.  Should you have questions after your visit or need to cancel or reschedule your appointment, please contact [facility]  Dept: [phone]  and follow the prompts.  Office hours are 8:00 a.m. to 4:30 p.m. Monday - Friday. Please note that voicemails left after 4:00 p.m. may not be returned until the following business day.  We are closed weekends and major holidays. You have access to a nurse at all times for urgent questions. Please call the main number to the clinic Dept: [phone] and follow the prompts.   For any non-urgent questions, you may also contact your provider using MyChart. We now offer e-Visits for anyone 56 and older to request care online for non-urgent symptoms. For details visit [email or web address].   Also download the MyChart app! Go to the app store, search "MyChart", open the app, select [facility], and log in with your MyChart username and password.  Due to Covid, a mask is required upon entering the hospital/clinic. If you do not have a mask, one will be given to you upon arrival. For doctor visits, patients may have 1 support person aged 48 or older with them. For treatment visits, patients cannot have anyone with them due to current Covid guidelines and our immunocompromised population.   The chemotherapy medication bag should finish at 46 hours, 96 hours, or 7 days. For example, if your pump is scheduled for 46 hours and it was put on at 4:00 p.m., it should finish at 2:00 p.m. the day it is scheduled to come off regardless  of your appointment time.     Estimated time to finish at 1:00 PM, 05/13/2021   If the display on your pump reads "Low Volume" and it is beeping, take the batteries out of the pump and come to the cancer center for it to be taken off.   If the pump alarms go off prior to the pump reading "Low Volume" then call [phone] and  someone can assist you.  If the plunger comes out and the chemotherapy medication is leaking out, please use your home chemo spill kit to clean up the spill. Do NOT use paper towels or other household products.  If you have problems or questions regarding your pump, please call either [phone] (24 hours a day) or the cancer center Monday-Friday 8:00 a.m.- 4:30 p.m. at the clinic number and we will assist you. If you are unable to get assistance, then go to the nearest Emergency Department and ask the staff to contact the IV team for assistance.    Influenza Virus Vaccine injection What is this medication? INFLUENZA VIRUS VACCINE (in floo EN zuh VAHY ruhs vak SEEN) helps to reduce the risk of getting influenza also known as the flu. The vaccine only helps protect you against some strains of the flu. This medicine may be used for other purposes; ask your health care provider or pharmacist if you have questions. COMMON BRAND NAME(S): Afluria, Afluria Quadrivalent, Agriflu, Alfuria, FLUAD, FLUAD Quadrivalent, Fluarix, Fluarix Quadrivalent, Flublok, Flublok Quadrivalent, FLUCELVAX, FLUCELVAX Quadrivalent, Flulaval, Flulaval Quadrivalent, Fluvirin, Fluzone, Fluzone High-Dose, Fluzone Intradermal, Fluzone Quadrivalent What should I tell my care team before I take this medication? They need to know if you have any of these conditions: bleeding disorder like hemophilia fever or infection Guillain-Barre syndrome or other neurological problems immune system problems infection with the human immunodeficiency virus (HIV) or AIDS low blood platelet counts multiple sclerosis an unusual or allergic reaction to influenza virus vaccine, latex, other medicines, foods, dyes, or preservatives. Different brands of vaccines contain different allergens. Some may contain latex or eggs. Talk to your doctor about your allergies to make sure that you get the right vaccine. pregnant or trying to get  pregnant breast-feeding How should I use this medication? This vaccine is for injection into a muscle or under the skin. It is given by a health care professional. A copy of Vaccine Information Statements will be given before each vaccination. Read this sheet carefully each time. The sheet may change frequently. Talk to your healthcare provider to see which vaccines are right for you. Some vaccines should not be used in all age groups. Overdosage: If you think you have taken too much of this medicine contact a poison control center or emergency room at once. NOTE: This medicine is only for you. Do not share this medicine with others. What if I miss a dose? This does not apply. What may interact with this medication? chemotherapy or radiation therapy medicines that lower your immune system like etanercept, anakinra, infliximab, and adalimumab medicines that treat or prevent blood clots like warfarin phenytoin steroid medicines like prednisone or cortisone theophylline vaccines This list may not describe all possible interactions. Give your health care provider a list of all the medicines, herbs, non-prescription drugs, or dietary supplements you use. Also tell them if you smoke, drink alcohol, or use illegal drugs. Some items may interact with your medicine. What should I watch for while using this medication? Report any side effects that do not go away within 3 days to your  doctor or health care professional. Call your health care provider if any unusual symptoms occur within 6 weeks of receiving this vaccine. You may still catch the flu, but the illness is not usually as bad. You cannot get the flu from the vaccine. The vaccine will not protect against colds or other illnesses that may cause fever. The vaccine is needed every year. What side effects may I notice from receiving this medication? Side effects that you should report to your doctor or health care professional as soon as  possible: allergic reactions like skin rash, itching or hives, swelling of the face, lips, or tongue Side effects that usually do not require medical attention (report to your doctor or health care professional if they continue or are bothersome): fever headache muscle aches and pains pain, tenderness, redness, or swelling at the injection site tiredness Side effects that you should report to your doctor or health care professional as soon as possible: allergic reactions like skin rash, itching or hives, swelling of the face, lips, or tongue Side effects that usually do not require medical attention (report to your doctor or health care professional if they continue or are bothersome): fever headache muscle aches and pains pain, tenderness, redness, or swelling at the injection site tiredness This list may not describe all possible side effects. Call your doctor for medical advice about side effects. You may report side effects to FDA at 1-800-FDA-1088. Where should I keep my medication? The vaccine will be given by a health care professional in a clinic, pharmacy, doctor's office, or other health care setting. You will not be given vaccine doses to store at home. NOTE: This sheet is a summary. It may not cover all possible information. If you have questions about this medicine, talk to your doctor, pharmacist, or health care provider.  2022 [employer] (2020-04-19 19:49:22)

## 2021-05-11 NOTE — Progress Notes (Signed)
[place]   Telephone:([phone]) [phone] Fax:(336) [phone]   Clinic Follow up Note   Patient Care Team: [name], MD as PCP - General (Family Medicine) Jerline Pain, MD as PCP - Cardiology (Cardiology) End, Harrell Gave, MD as Consulting Physician (Cardiology) Alda Berthold, DO as Consulting Physician (Neurology) Heilingoetter, Tobe Sos, PA-C as Physician Assistant (Physician Assistant) Truitt Merle, MD as Consulting Physician (Oncology) Dwan Bolt, MD as Consulting Physician (General Surgery) 05/11/2021  CHIEF COMPLAINT: Follow up pancreas cancer   SUMMARY OF ONCOLOGIC HISTORY: Oncology History Overview Note  Cancer Staging Pancreatic cancer [facility]) Staging form: Exocrine Pancreas, AJCC 8th Edition - Clinical stage from 02/09/2021: Stage IV (cT2, cN0, cM1) - Signed by Truitt Merle, MD on 03/16/2021 Total positive nodes: 0    Pancreatic cancer ([place])  01/11/2021 Imaging   CT Abdomen/Pelvis  IMPRESSION: Multiple low-attenuation liver lesions, which cannot be characterized on this unenhanced exam. Abdomen MRI without and with contrast is recommended for further characterization.   Diffuse biliary ductal dilatation, with soft tissue prominence in the region of the pancreatic head. Pancreatic mass cannot be excluded on this unenhanced exam. Recommend abdomen MRI and MRCP without and with contrast for further evaluation.   Colonic diverticulosis, without radiographic evidence of diverticulitis.     01/12/2021 Imaging   MRI ABDOMEN WITHOUT AND WITH CONTRAST (INCLUDING MRCP)  IMPRESSION: Biliary duct distension of both intra and extrahepatic biliary tree with abrupt caliber change but with no visible mass on imaging. Caliber change more abrupt than expected for stricture and the biliary tree was top-normal size at 6 mm in 2017. Endoscopic assessment is suggested to exclude an occult biliary lesion or atypical stricture.   Lobulated T2 bright lesion in  the medial segment of the LEFT hepatic lobe that favors a sclerosed hemangioma. There is some internal septation suggested and the filling pattern is slightly atypical. Consider 3 to six-month follow-up with extended delays following contrast to 7 minutes better assess filling characteristics, differential consideration would include a biliary cystadenoma with septal enhancement.   01/13/2021 Procedure   ERCP under the care of Dr. Carlean Purl  IMPRESSION: 1. Dilation of the common bile duct and main pancreatic duct. 2. Sphincterotomy and placement of a plastic biliary stent.    01/13/2021 Pathology Results   CASE: [id number]   A. COMMON BILE DUCT, STRICTURE, #1, BRUSHING:   FINAL MICROSCOPIC DIAGNOSIS:  - Suspicious for malignancy  - See comment    02/08/2021 Imaging   CT CHEST WITHOUT CONTRAST AND CT OF ABDOMEN AND PELVIS WITH CONTRAST  IMPRESSION: 1. Findings are highly suspicious for a hypovascular mass in the posterior aspect of the head of the pancreas, as detailed above. This appears associated with persistent narrowing of the distal common bile duct and abrupt termination of the pancreatic duct. Common bile duct stent appears appropriately located, but is associated with some persistent dilatation of the common bile duct. 2. 1.2 x 0.7 cm ground-glass attenuation nodule in the right upper lobe. Initial follow-up with CT at 6-12 months is recommended to confirm persistence. If persistent, repeat CT is recommended every 2 years until 5 years of stability has been established. This recommendation follows the consensus statement: Guidelines for Management of Incidental Pulmonary Nodules Detected on CT Images: From the Fleischner Society 2017; Radiology 2017; 284:228-243. 3. Multiple liver lesions, smallest of which likely represent tiny simple cysts. The larger lesion in the left lobe of the liver has imaging characteristics once again most suggestive of a  cavernous  hemangioma. 4. Aortic atherosclerosis, in addition to left anterior descending coronary artery disease. Please note that although the presence of coronary artery calcium documents the presence of coronary artery disease, the severity of this disease and any potential stenosis cannot be assessed on this non-gated CT examination. Assessment for potential risk factor modification, dietary therapy or pharmacologic therapy may be warranted, if clinically indicated. 5. Additional incidental findings, as above.    02/09/2021 Procedure   ERCP under the care of Dr. Rush Landmark   IMPRESSION: Limited images during ERCP demonstrates removal plastic biliary stent and placement of a new metallic biliary stent. Please refer to the dictated operative report for full details of intraoperative findings and procedure.   02/09/2021 Pathology Results   CASE: [id number]  FINAL MICROSCOPIC DIAGNOSIS:  A. PANCREAS, HEAD OF LESION, FINE NEEDLE ASPIRATION:  - Malignant cells consistent with adenocarcinoma    02/09/2021 Procedure   EGD Impression: - No gross lesions in esophagus. Z-line regular, 40 cm from the incisors. - Severe gastritis noted. No other gross lesions in the stomach. Biopsied. - Plastic biliary stent in the duodenum. Removed. Revealed a patent biliary sphincterotomy was found. - No gross lesions in the duodenal bulb, in the first portion of the duodenum and in the second portion of the duodenum. EUS Impression: - Hyperechoic material consistent with sludge was visualized endosonographically in the gallbladder. - There was a suggestion of a stricture in the lower third of the main bile duct leading to dilation in the middle third of the main bile duct and in the upper third of the main bile duct with evidence of hyperechoic material consistent with sludge within the common bile duct. - A mass-like region was identified in the pancreatic head where the CBD narrowed and where the  upstream PD dilation was noted. This is not the most confluent of masses however. Cytology results are pending. However, the endosonographic appearance is suggestive of potential pancreatic adenocarcinoma. This was staged T2 N0 Mx by endosonographic criteria. The staging applies if malignancy is confirmed. Fine needle biopsy performed. - No malignant-appearing lymph nodes were visualized in the celiac region (level 20), peripancreatic region and porta hepatis region.   02/09/2021 Cancer Staging   Staging form: Exocrine Pancreas, AJCC 8th Edition - Clinical stage from 02/09/2021: Stage IV (cT2, cN0, cM1) - Signed by Truitt Merle, MD on 03/16/2021 Total positive nodes: 0   02/13/2021 Initial Diagnosis   Pancreatic cancer ([place])   02/28/2021 Imaging   PET  Signs of pancreatic head mass with suspected hepatic metastatic lesion in the RIGHT hepatic lobe as described.   Equivocal uptake in adjacent lymph nodes, potentially reactive in the setting of biliary stent. Note the biliary stent placement is in the mid to distal common bile duct but appears similar to recent ERCP images.   Stable appearance of ground-glass in the RIGHT upper lobe. This does not display increased FDG uptake and would be atypical for metastatic lesion based on appearance and location. Suggest six-month follow-up for further evaluation.   RIGHT thyroid uptake is slightly asymmetric potentially associated with small nodule. Recommend thyroid ultrasound with biopsy of focal abnormality is seen.(Ref: J Am Coll Radiol. 2015 Feb;12(2): 143-50).   03/02/2021 -  Chemotherapy    Patient is on Treatment Plan: PANCREAS MODIFIED FOLFIRINOX Q14D X 4 CYCLES       04/25/2021 Imaging   CT AP w contrast IMPRESSION: 1. No evidence of pancreatic carcinoma progression by CT imaging. 2. Hypermetabolic lesions on comparison PET scan within the  head of the pancreas and liver were essentially occult by CT imaging. Consider follow-up FDG PET  scan at some future point. 3. Multiple small benign-appearing hypodense lesions in the liver and benign hepatic hemangioma unchanged from prior. 4. No evidence of lymphadenopathy     CURRENT THERAPY: First line FOLFIRINOX q2 weeks, starting 03/02/21, OXali held with C3 due to neuropathy. Dose reduced to 70 mg/m2 added back with C4  INTERVAL HISTORY: Ms. Smelcer returns for follow up and treatment as scheduled. She completed cycle 5 mFOLFIRINOX on 04/27/21.  She is having more frequent sharp cramping abdominal pain that can last up to 1 hour. This pain began after starting chemo. Saturday was a bad day and she was mostly in bed.  She takes oxycodone for this which is effective.  She had mild nausea without vomiting.  Normally does not eat much for few days after treatment but makes up for it in the second week.  Bowels move every 2 days.  Energy fluctuates with more fatigue lately.  She continues working.  Cold sensitivity lasts 6-7 days, no residual neuropathy in the absence of cold exposure.  Denies fever, chills, cough, chest pain, dyspnea, leg edema, mucositis, rash, or other new concerns.   MEDICAL HISTORY:  Past Medical History:  Diagnosis Date   Asthma    Bipolar disorder ([place])    Diverticulosis    Gestational hypertension    HA (headache)    History of borderline personality disorder    Hypersomnia, persistent 04/29/2013   IBS (irritable bowel syndrome)    Memory loss, short term    [name] 04-03-13    Migraine    Pancreatic cancer ([place])    Reflux esophagitis    Seasonal allergies     SURGICAL HISTORY: Past Surgical History:  Procedure Laterality Date   ABDOMINAL HYSTERECTOMY  2000   BILIARY BRUSHING  01/13/2021   Procedure: BILIARY BRUSHING;  Surgeon: Gatha Mayer, MD;  Location: [facility];  Service: Endoscopy;;   BILIARY DILATION  02/09/2021   Procedure: BILIARY DILATION;  Surgeon: Irving Copas., MD;  Location: [place];  Service: Gastroenterology;;    BILIARY STENT PLACEMENT N/A 01/13/2021   Procedure: BILIARY STENT PLACEMENT;  Surgeon: Gatha Mayer, MD;  Location: [facility];  Service: Endoscopy;  Laterality: N/A;   BILIARY STENT PLACEMENT  02/09/2021   Procedure: BILIARY STENT PLACEMENT;  Surgeon: Irving Copas., MD;  Location: [place];  Service: Gastroenterology;;   BIOPSY  01/13/2021   Procedure: BIOPSY;  Surgeon: Gatha Mayer, MD;  Location: [facility];  Service: Endoscopy;;   BIOPSY  02/09/2021   Procedure: BIOPSY;  Surgeon: Irving Copas., MD;  Location: [facility];  Service: Gastroenterology;;   COLONOSCOPY  01/2020   ERCP N/A 01/13/2021   Procedure: ENDOSCOPIC RETROGRADE CHOLANGIOPANCREATOGRAPHY (ERCP);  Surgeon: Gatha Mayer, MD;  Location: [facility];  Service: Endoscopy;  Laterality: N/A;   ERCP N/A 02/09/2021   Procedure: ENDOSCOPIC RETROGRADE CHOLANGIOPANCREATOGRAPHY (ERCP);  Surgeon: Irving Copas., MD;  [facility] [place];  Service: Gastroenterology;  Laterality: N/A;   ESOPHAGOGASTRODUODENOSCOPY (EGD) WITH PROPOFOL N/A 02/09/2021   Procedure: ESOPHAGOGASTRODUODENOSCOPY (EGD) WITH PROPOFOL;  Surgeon: Rush Landmark Telford Nab., MD;  Location: [place];  Service: Gastroenterology;  Laterality: N/A;   EUS N/A 02/09/2021   Procedure: UPPER ENDOSCOPIC ULTRASOUND (EUS) LINEAR;  Surgeon: Irving Copas., MD;  Location: [place];  Service: Gastroenterology;  Laterality: N/A;   FINE NEEDLE ASPIRATION  02/09/2021   Procedure: FINE NEEDLE ASPIRATION (FNA) LINEAR;  Surgeon: Rush Landmark Telford Nab., MD;  Location: [place];  Service: Gastroenterology;;   KNEE SURGERY Right [phone]   NASAL SINUS SURGERY     NOSE SURGERY     [phone]   PORTACATH PLACEMENT Right 02/17/2021   Procedure: INSERTION PORT-A-CATH;  Surgeon: Dwan Bolt, MD;  Location: [facility];  Service: General;  Laterality: Right;  LMA ANESTHESIA   REPLACEMENT TOTAL KNEE Right     SPHINCTEROTOMY  01/13/2021   Procedure: SPHINCTEROTOMY;  Surgeon: Gatha Mayer, MD;  Location: [facility];  Service: Endoscopy;;   STENT REMOVAL  02/09/2021   Procedure: STENT REMOVAL;  Surgeon: Irving Copas., MD;  Location: [place];  Service: Gastroenterology;;   WRIST SURGERY Right [phone]    I have reviewed the social history and family history with the patient and they are unchanged from previous note.  ALLERGIES:  is allergic to aspirin, bee venom, penicillin g, penicillins, bactrim [sulfamethoxazole-trimethoprim], coconut oil, sulfa antibiotics, and sulfamethoxazole-trimethoprim.  MEDICATIONS:  Current Outpatient Medications  Medication Sig Dispense Refill   dicyclomine (BENTYL) 10 MG capsule Take 1 capsule (10 mg total) by mouth 4 (four) times daily -  before meals and at bedtime. 30 capsule 1   acetaminophen (TYLENOL) 325 MG tablet Take 325-650 mg by mouth every 6 (six) hours as needed for mild pain or headache.     albuterol (VENTOLIN HFA) 108 (90 Base) MCG/ACT inhaler Inhale 2 puffs into the lungs every 6 (six) hours as needed for wheezing or shortness of breath.     cyclobenzaprine (FLEXERIL) 5 MG tablet TAKE 1 TABLET(5 MG) BY MOUTH AT BEDTIME AS NEEDED FOR LOWER BACK PAIN OR SEVERE HEADACHE (Patient taking differently: Take 5 mg by mouth at bedtime as needed (for lower back pain or severe headaches).) 30 tablet 0   diphenoxylate-atropine (LOMOTIL) 2.5-0.025 MG tablet Take 1-2 tabs by mouth 4 times a day as needed for loose stools. 30 tablet 0   EPINEPHrine 0.3 mg/0.3 mL IJ SOAJ injection Inject 0.3 mg into the muscle as needed for anaphylaxis. INJECT 0.3 MLS (0.3 MG TOTAL) INTO THE MUSCLE ONCE (Patient taking differently: Inject 0.3 mg into the muscle once as needed for anaphylaxis.) 1 each 0   fluticasone (FLONASE) 50 MCG/ACT nasal spray Place 2 sprays into both nostrils daily.     gabapentin (NEURONTIN) 300 MG capsule Take 300 mg by mouth 3 (three) times  daily as needed (for neuropathic pain).     HYDROcodone-acetaminophen (NORCO/VICODIN) 5-325 MG tablet Take 1 tablet by mouth every 6 (six) hours as needed for moderate pain or severe pain.     LATUDA 40 MG TABS tablet Take 1 tablet (40 mg total) by mouth daily with breakfast. (Patient not taking: No sig reported) 90 tablet 0   lidocaine-prilocaine (EMLA) cream Apply to affected area once (Patient taking differently: Apply 1 application topically as directed.) 30 g 3   magic mouthwash w/lidocaine SOLN Take 10 mLs by mouth 4 (four) times daily as needed for mouth pain. 10 mls swish and spit by mouth four times daily as needed 470 mL 2   montelukast (SINGULAIR) 10 MG tablet Take 10 mg by mouth daily.     Multiple Vitamin (MULTIVITAMIN WITH MINERALS) TABS tablet Take 1 tablet by mouth daily. 30 tablet 2   ondansetron (ZOFRAN) 8 MG tablet Take 1 tablet (8 mg total) by mouth every 8 (eight) hours as needed for nausea or vomiting. Starting on day 3 after chemotherapy 30 tablet 2   ondansetron (ZOFRAN) 8  MG tablet Take 1 tablet (8 mg total) by mouth 2 (two) times daily as needed. Start on day 3 after chemotherapy. (Patient not taking: No sig reported) 30 tablet 1   oxyCODONE (OXY IR/ROXICODONE) 5 MG immediate release tablet Take 1-2 tablets (5-10 mg total) by mouth every 6 (six) hours as needed for severe pain. 90 tablet 0   PRESCRIPTION MEDICATION Inject into the vein See admin instructions. FOLFIRINOX- Every 14 days for 8 treatments     prochlorperazine (COMPAZINE) 10 MG tablet Take 1 tablet (10 mg total) by mouth every 6 (six) hours as needed (Nausea or vomiting). (Patient taking differently: Take 10 mg by mouth every 6 (six) hours as needed for vomiting or nausea.) 30 tablet 1   promethazine (PHENERGAN) 25 MG tablet Take 25 mg by mouth every 6 (six) hours as needed for vomiting or nausea.     No current facility-administered medications for this visit.    PHYSICAL EXAMINATION: ECOG PERFORMANCE STATUS:  1 - Symptomatic but completely ambulatory  Vitals:   05/11/21 1002  BP: (!) 141/77  Pulse: 70  Resp: 18  Temp: 98.6 F (37 C)  SpO2: 100%   Filed Weights   05/11/21 1002  Weight: 206 lb (93.4 kg)    GENERAL:alert, no distress and comfortable SKIN: No rash EYES: sclera clear OROPHARYNX: White coating to tongue NECK: Without mass LUNGS: clear with normal breathing effort HEART: regular rate & rhythm, no lower extremity edema ABDOMEN:abdomen soft with normal bowel sounds.  Mild tenderness in the epigastric area and lower abdomen NEURO: alert & oriented x 3 with fluent speech, no focal motor/sensory deficits PAC without erythema  LABORATORY DATA:  I have reviewed the data as listed CBC Latest Ref Rng & Units 05/11/2021 04/27/2021 04/13/2021  WBC 4.0 - 10.5 K/uL 5.7 5.7 6.7  Hemoglobin 12.0 - 15.0 g/dL 11.9(L) 11.0(L) 11.6(L)  Hematocrit 36.0 - 46.0 % 37.2 34.4(L) 36.0  Platelets 150 - 400 K/uL 163 207 198     CMP Latest Ref Rng & Units 05/11/2021 04/27/2021 04/13/2021  Glucose 70 - 99 mg/dL 89 101(H) 90  BUN 6 - 20 mg/dL 12 10 15   Creatinine 0.44 - 1.00 mg/dL 0.91 1.00 1.08(H)  Sodium 135 - 145 mmol/L 141 145 141  Potassium 3.5 - 5.1 mmol/L 4.1 4.0 4.3  Chloride 98 - 111 mmol/L 109 111 108  CO2 22 - 32 mmol/L 24 24 25   Calcium 8.9 - 10.3 mg/dL 9.8 9.9 9.6  Total Protein 6.5 - 8.1 g/dL 7.2 6.6 7.0  Total Bilirubin 0.3 - 1.2 mg/dL 0.6 0.4 0.5  Alkaline Phos 38 - 126 U/L 65 55 62  AST 15 - 41 U/L 46(H) 26 31  ALT 0 - 44 U/L 67(H) 26 36      RADIOGRAPHIC STUDIES: I have personally reviewed the radiological images as listed and agreed with the findings in the report. No results found.   ASSESSMENT & PLAN: Dillie Burandt is a 55 y.o. female with    1. Adenocarcinoma of the pancreas, cT2N0M1 with properble liver met  -Presented with fatigue, abdominal pain, and jaundice 12/2020 s/p biliary stent x2  -CT 02/08/2021 showed 2.5 cm hypervascular area in pancreatic head adjacent  to the common bile duct in direct contact with anterior surface of inferior vena cava, 1.2 cm groundglass right lung nodule, and multiple liver lesions -Baseline CA 19.9 elevated at 72 -EUS showed a masslike region in the pancreatic head where the CBD narrowed with upstream PD dilatation noted.  Endosonographic appearance was suggestive of potential pancreatic adenocarcinoma T2 N0 MX.  Final path showed malignant cells consistent with adenocarcinoma - PET scan showed a hypermetabolic liver lesion in the right lobe, adjacent to right kidney, suspicious for oligo liver metastasis. No other definitive distant mets. This was not visible on ultrasound, Liver biopsy is not feasible   -She began C1 FOLFIRINOX 03/02/21. Her insurance denied prophylactic G-CSF. -s/p cycle 2 she began to develop early neuropathyexali held with cycle 3.  -s/p cycle 3 she developed acute abd pain and nausea, with elevated lipase, concerning for acute pancreatitis. She has was hospitalized for hydration and pain management.  -She recovered well, neuropathy resolved.  Able to resume chemo with cycle 4 FOLFIRINOX and dose reduced oxaliplatin on 04/13/2021 -restaging CT 04/25/21 showed the hypermetabolic lesions on comparison PET within the head of pancreas and liver were essentially called by CT, stable benign-appearing liver lesions, no progression of the pancreatic mass or adenopathy. -CA 19-9 improving on chemo.  Overall consistent with a good treatment response   2. 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 -decreased sensation in hands, more so on right -She started B complex and oxalic was held with cycle 3 -Neuropathy resolved, added Oxali back 70 mg/m2 with cycle 4 -She had some episodes of neuropathy with cold exposure only, none recently    3. Right upper quadrant/epigastric abdominal pain -Patient currently being treated with oxycodone 1-2 tablets per day and tylenol if needed. -Denies new or worsening pain -she has  increased cramping abdominal pain, likely secondary to irinotecan. Will dose reduce to 130 mg/m2 and add bentyl.   4. Family history for cancer  -Maternal great-grandmother, grandmother, and 2 sisters with breast cancer -The patient has 1 biological daughter -declined referral for genetic testing at this time   5. 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 Floor appears stable.  She completed 5 cycles of mFOLFIRINOX.  She tolerates treatment well overall with stable nausea, cold sensitivity, and fatigue.  Side effects are well managed with supportive care at home.  She is able to recover and function well.  There is no clinical evidence of disease progression.  We will follow-up on the pending CA 19-9 from today.  She has increased cramping abdominal pain likely secondary to irinotecan.  We will add Bentyl as needed and continue oxycodone.  We will dose reduce irinotecan to 130 mg/m2 today.  Labs reviewed, AST and ALT are likely elevated from chemo.  Labs adequate to proceed with cycle 6 FOLFIRINOX today as planned.  She will return 9/17 for pump d/c.  Follow-up in 2 weeks with cycle 7.  Plan was reviewed with [name].   Ms. Kinlaw will receive the flu vaccine today.  All questions were answered. The patient knows to call the clinic with any problems, questions or concerns. No barriers to learning were detected.     Alla Feeling, NP 05/11/21

## 2021-05-12 LAB — CANCER ANTIGEN 19-9: CA 19-9: 37 U/mL — ABNORMAL HIGH (ref 0–35)

## 2021-05-13 ENCOUNTER — Inpatient Hospital Stay: Payer: 59

## 2021-05-13 ENCOUNTER — Other Ambulatory Visit: Payer: Self-pay

## 2021-05-13 VITALS — BP 130/79 | HR 94 | Temp 98.4°F | Resp 18

## 2021-05-13 DIAGNOSIS — Z5111 Encounter for antineoplastic chemotherapy: Secondary | ICD-10-CM | POA: Diagnosis not present

## 2021-05-13 DIAGNOSIS — C250 Malignant neoplasm of head of pancreas: Secondary | ICD-10-CM

## 2021-05-13 MED ORDER — SODIUM CHLORIDE 0.9% FLUSH
3.0000 mL | INTRAVENOUS | Status: DC | PRN
  Administered 2021-05-13: 3 mL

## 2021-05-13 MED ORDER — HEPARIN SOD (PORK) LOCK FLUSH 100 UNIT/ML IV SOLN
500.0000 [IU] | Freq: Once | INTRAVENOUS | Status: AC | PRN
  Administered 2021-05-13: 500 [IU]

## 2021-05-13 NOTE — Progress Notes (Addendum)
Patient chemo pump d/c'd today, port flushes well, deaccessed. No concerns voiced, patient discharged, stable.

## 2021-05-16 ENCOUNTER — Telehealth: Payer: Self-pay | Admitting: Hematology

## 2021-05-16 ENCOUNTER — Encounter: Payer: Self-pay | Admitting: Hematology

## 2021-05-16 ENCOUNTER — Telehealth: Payer: Self-pay | Admitting: Surgery

## 2021-05-16 NOTE — Telephone Encounter (Signed)
I called the pt to let her know that [name] was ok with the pt cancelling her appointment for chemo on 05/25/21, but [name] wants the pt to keep her office visit appointment so that they can discuss different treatment options.  The pt verbalized understanding and was told to call if she had any more concerns.

## 2021-05-16 NOTE — Telephone Encounter (Signed)
Scheduled follow-up appointments per 9/15 los. Patient is aware.

## 2021-05-18 ENCOUNTER — Other Ambulatory Visit: Payer: Self-pay | Admitting: Hematology

## 2021-05-18 MED ORDER — OXYCODONE HCL 5 MG PO TABS: 5.0000 mg | ORAL_TABLET | Freq: Four times a day (QID) | ORAL | 0 refills | Status: DC | PRN

## 2021-05-22 ENCOUNTER — Encounter: Payer: Self-pay | Admitting: Family Medicine

## 2021-05-22 ENCOUNTER — Other Ambulatory Visit: Payer: Self-pay

## 2021-05-22 ENCOUNTER — Ambulatory Visit (INDEPENDENT_AMBULATORY_CARE_PROVIDER_SITE_OTHER): Payer: 59 | Admitting: Family Medicine

## 2021-05-22 VITALS — BP 127/75 | HR 66 | Temp 98.0°F | Ht 69.0 in | Wt 207.0 lb

## 2021-05-22 DIAGNOSIS — G43811 Other migraine, intractable, with status migrainosus: Secondary | ICD-10-CM

## 2021-05-22 MED ORDER — KETOROLAC TROMETHAMINE 60 MG/2ML IM SOLN
60.0000 mg | Freq: Once | INTRAMUSCULAR | Status: AC
  Administered 2021-05-22: 60 mg via INTRAMUSCULAR

## 2021-05-22 NOTE — Patient Instructions (Addendum)
Toradol injection 60 mg today.   So sorry you are hurting- get some rest this morning/afternoon and hope you feel better!   Follow up as needed if new or worsening symptoms or failure to improve by Wednesday (can retry cocktail tomorrow)

## 2021-05-22 NOTE — Addendum Note (Signed)
Addended by: Linton Ham on: 05/22/2021 09:46 AM   Modules accepted: Orders

## 2021-05-22 NOTE — Progress Notes (Signed)
Phone [phone] In person visit   Subjective:   Robin [name] is a 55 y.o. year old very pleasant female patient who presents for/with See problem oriented charting Chief Complaint  Patient presents with   Headache    Patient complains of a bad headache since yesterday.     This visit occurred during the SARS-CoV-2 public health emergency.  Safety protocols were in place, including screening questions prior to the visit, additional usage of staff PPE, and extensive cleaning of exam room while observing appropriate contact time as indicated for disinfecting solutions.   Past Medical History-  Patient Active Problem List   Diagnosis Date Noted   Pain in the abdomen 04/05/2021   Port-A-Cath in place 03/16/2021   Pancreatic cancer ([place]) 02/13/2021   Pancreatic mass 02/12/2021   Protein-calorie malnutrition, severe 02/09/2021   Abdominal pain    Biliary stricture    Hyperbilirubinemia    Elevated LFTs 01/11/2021   Hyperglycemia 12/30/2019   Dyslipidemia 12/30/2019   DDD (degenerative disc disease), lumbar 10/10/2017   Primary narcolepsy without cataplexy 09/03/2017   Syncope 07/08/2017   Diverticulosis 05/01/2017   Stomach ulcer 04/01/2017   Narcolepsy due to underlying condition without cataplexy 07/18/2016   Chronic pansinusitis 01/27/2016   History of colonoscopy with polypectomy 04/14/2015   Bipolar I disorder ([place]) 11/23/2014   Obesity 12/23/2013   Sleep apnea 11/25/2013   Migraine 10/28/2013   Hypersomnia, persistent 04/29/2013   Memory loss, short term    Cognitive decline 04/03/2013   Depression 04/03/2013    Medications- reviewed and updated Current Outpatient Medications  Medication Sig Dispense Refill   acetaminophen (TYLENOL) 325 MG tablet Take 325-650 mg by mouth every 6 (six) hours as needed for mild pain or headache.     albuterol (VENTOLIN HFA) 108 (90 Base) MCG/ACT inhaler Inhale 2 puffs into the lungs every 6 (six) hours as needed for wheezing  or shortness of breath.     cyclobenzaprine (FLEXERIL) 5 MG tablet TAKE 1 TABLET(5 MG) BY MOUTH AT BEDTIME AS NEEDED FOR LOWER BACK PAIN OR SEVERE HEADACHE (Patient taking differently: Take 5 mg by mouth at bedtime as needed (for lower back pain or severe headaches).) 30 tablet 0   dicyclomine (BENTYL) 10 MG capsule Take 1 capsule (10 mg total) by mouth 4 (four) times daily -  before meals and at bedtime. 30 capsule 1   diphenoxylate-atropine (LOMOTIL) 2.5-0.025 MG tablet Take 1-2 tabs by mouth 4 times a day as needed for loose stools. 30 tablet 0   EPINEPHrine 0.3 mg/0.3 mL IJ SOAJ injection Inject 0.3 mg into the muscle as needed for anaphylaxis. INJECT 0.3 MLS (0.3 MG TOTAL) INTO THE MUSCLE ONCE (Patient taking differently: Inject 0.3 mg into the muscle once as needed for anaphylaxis.) 1 each 0   fluticasone (FLONASE) 50 MCG/ACT nasal spray Place 2 sprays into both nostrils daily.     gabapentin (NEURONTIN) 300 MG capsule Take 300 mg by mouth 3 (three) times daily as needed (for neuropathic pain).     HYDROcodone-acetaminophen (NORCO/VICODIN) 5-325 MG tablet Take 1 tablet by mouth every 6 (six) hours as needed for moderate pain or severe pain.     LATUDA 40 MG TABS tablet Take 1 tablet (40 mg total) by mouth daily with breakfast. 90 tablet 0   lidocaine-prilocaine (EMLA) cream Apply to affected area once (Patient taking differently: Apply 1 application topically as directed.) 30 g 3   magic mouthwash w/lidocaine SOLN Take 10 mLs by mouth 4 (four)  times daily as needed for mouth pain. 10 mls swish and spit by mouth four times daily as needed 470 mL 2   montelukast (SINGULAIR) 10 MG tablet Take 10 mg by mouth daily.     ondansetron (ZOFRAN) 8 MG tablet Take 1 tablet (8 mg total) by mouth every 8 (eight) hours as needed for nausea or vomiting. Starting on day 3 after chemotherapy 30 tablet 2   oxyCODONE (OXY IR/ROXICODONE) 5 MG immediate release tablet Take 1-2 tablets (5-10 mg total) by mouth every 6  (six) hours as needed for severe pain. 90 tablet 0   PRESCRIPTION MEDICATION Inject into the vein See admin instructions. FOLFIRINOX- Every 14 days for 8 treatments     prochlorperazine (COMPAZINE) 10 MG tablet Take 1 tablet (10 mg total) by mouth every 6 (six) hours as needed (Nausea or vomiting). (Patient taking differently: Take 10 mg by mouth every 6 (six) hours as needed for vomiting or nausea.) 30 tablet 1   promethazine (PHENERGAN) 25 MG tablet Take 25 mg by mouth every 6 (six) hours as needed for vomiting or nausea.     Pyridoxine HCl (VITAMIN B6 PO) Take by mouth.     No current facility-administered medications for this visit.     Objective:  BP 127/75   Pulse 66   Temp 98 F (36.7 C) (Temporal)   Ht 5\' 9"  (1.753 m)   Wt 207 lb (93.9 kg)   SpO2 99%   BMI 30.57 kg/m  Gen: NAD, resting comfortably CV: RRR no murmurs rubs or gallops Lungs: CTAB no crackles, wheeze, rhonchi Abdomen: soft/nontender/nondistended/normal bowel sounds. No rebound or guarding.  Ext: no edema Skin: warm, dry Neuro: CN II-XII intact, sensation and reflexes normal throughout, 5/5 muscle strength in bilateral upper and lower extremities. Normal finger to nose. Normal rapid alternating movements. No pronator drift. Normal romberg. Normal gait.      Assessment and Plan   # headaches (migraines history)  S: Has a hx of migraines.  Office visit with Dr.Parker on 07/05/20:continued with abortive therapy as needed-also considered trail of preventive medication.   3 weeks prior to that was treated with benadryl, diclofenac and compazine by DR. Parker for a flare with plan for toradol if that was not helpful  CMP within 2 weeks- cr normal- mild LFT elevation  No chest pain or shortness of breath.  No blurry or double vision.  Does get some nausea but has medications available at home for this.  Current headache started yesterday. Slightly better today. She took her typical cocktail yesterday with  benadryl, diclofenac, and compazine was at 8-9/10 before this and currently at 5/10. Last treated for headache with toradol it appears 04/01/17 for headache at 60 mg- she is not 100% sure if this resolved issue. This is very typical for her. Worsens if becomes really active or with brighter lights (we turned off a set of lights today) A/P: 55 year old female with history of migraines currently under treatment for pancreatic cancer presenting with typical migraine but failed home cocktail as above.  Reassuring neuro exam today. She states this is very typical for her so we opted not to pursue neuroimaging unless she were to fail to improve with treatment.  We are going to try 60 mg of Toradol and have her go home and rest this morning and update [place] if fails to improve over the next day.  Recommended follow up: as needed fo rnew or worsening symptoms or symptoms that fail to improve Future  Appointments  Date Time Provider [place]  05/22/2021  9:40 AM Marin Olp, MD [facility]  05/25/2021  8:30 AM [facility] [place] [facility] None  05/25/2021  9:00 AM Truitt Merle, MD [facility] None  05/27/2021 10:30 AM [facility] [place] [facility] None  06/08/2021 10:00 AM [facility] [place] [facility] None  06/08/2021 10:40 AM Truitt Merle, MD [facility] None  06/08/2021 11:30 AM [facility] INFUSION [facility] None  06/10/2021 11:45 AM [facility] FLUSH [facility] None   Lab/Order associations:   ICD-10-CM   1. Other migraine with status migrainosus, intractable  G43.811       No orders of the defined types were placed in this encounter.  I,Jada Bradford,acting as a scribe for Garret Reddish, MD.,have documented all relevant documentation on the behalf of Garret Reddish, MD,as directed by  Garret Reddish, MD while in the presence of Garret Reddish, MD.  I, Garret Reddish, MD, have reviewed all documentation for this visit. The documentation on 05/22/21 for the exam, diagnosis, procedures, and  orders are all accurate and complete.  Return precautions advised.  Garret Reddish, MD

## 2021-05-24 MED FILL — Fosaprepitant Dimeglumine For IV Infusion 150 MG (Base Eq): INTRAVENOUS | Qty: 5 | Status: AC

## 2021-05-24 MED FILL — Dexamethasone Sodium Phosphate Inj 100 MG/10ML: INTRAMUSCULAR | Qty: 1 | Status: AC

## 2021-05-25 ENCOUNTER — Encounter: Payer: Self-pay | Admitting: Hematology

## 2021-05-25 ENCOUNTER — Ambulatory Visit: Payer: 59

## 2021-05-25 ENCOUNTER — Inpatient Hospital Stay (HOSPITAL_BASED_OUTPATIENT_CLINIC_OR_DEPARTMENT_OTHER): Payer: 59 | Admitting: Hematology

## 2021-05-25 ENCOUNTER — Other Ambulatory Visit: Payer: Self-pay

## 2021-05-25 ENCOUNTER — Inpatient Hospital Stay: Payer: 59

## 2021-05-25 VITALS — BP 137/77 | HR 67 | Temp 98.0°F | Resp 18 | Ht 69.0 in | Wt 203.4 lb

## 2021-05-25 DIAGNOSIS — Z95828 Presence of other vascular implants and grafts: Secondary | ICD-10-CM

## 2021-05-25 DIAGNOSIS — C250 Malignant neoplasm of head of pancreas: Secondary | ICD-10-CM | POA: Diagnosis not present

## 2021-05-25 DIAGNOSIS — Z5111 Encounter for antineoplastic chemotherapy: Secondary | ICD-10-CM | POA: Diagnosis not present

## 2021-05-25 LAB — CBC WITH DIFFERENTIAL (CANCER CENTER ONLY)
Abs Immature Granulocytes: 0 10*3/uL (ref 0.00–0.07)
Basophils Absolute: 0 10*3/uL (ref 0.0–0.1)
Basophils Relative: 0 %
Eosinophils Absolute: 0.1 10*3/uL (ref 0.0–0.5)
Eosinophils Relative: 2 %
HCT: 35.1 % — ABNORMAL LOW (ref 36.0–46.0)
Hemoglobin: 11.3 g/dL — ABNORMAL LOW (ref 12.0–15.0)
Immature Granulocytes: 0 %
Lymphocytes Relative: 40 %
Lymphs Abs: 1.9 10*3/uL (ref 0.7–4.0)
MCH: 28.6 pg (ref 26.0–34.0)
MCHC: 32.2 g/dL (ref 30.0–36.0)
MCV: 88.9 fL (ref 80.0–100.0)
Monocytes Absolute: 0.5 10*3/uL (ref 0.1–1.0)
Monocytes Relative: 10 %
Neutro Abs: 2.2 10*3/uL (ref 1.7–7.7)
Neutrophils Relative %: 48 %
Platelet Count: 151 10*3/uL (ref 150–400)
RBC: 3.95 MIL/uL (ref 3.87–5.11)
RDW: 16.6 % — ABNORMAL HIGH (ref 11.5–15.5)
WBC Count: 4.7 10*3/uL (ref 4.0–10.5)
nRBC: 0 % (ref 0.0–0.2)

## 2021-05-25 LAB — CMP (CANCER CENTER ONLY)
ALT: 35 U/L (ref 0–44)
AST: 31 U/L (ref 15–41)
Albumin: 3.6 g/dL (ref 3.5–5.0)
Alkaline Phosphatase: 58 U/L (ref 38–126)
Anion gap: 10 (ref 5–15)
BUN: 11 mg/dL (ref 6–20)
CO2: 23 mmol/L (ref 22–32)
Calcium: 9.3 mg/dL (ref 8.9–10.3)
Chloride: 111 mmol/L (ref 98–111)
Creatinine: 1.05 mg/dL — ABNORMAL HIGH (ref 0.44–1.00)
GFR, Estimated: >60 mL/min (ref >60)
Glucose, Bld: 107 mg/dL — ABNORMAL HIGH (ref 70–99)
Potassium: 4.2 mmol/L (ref 3.5–5.1)
Sodium: 144 mmol/L (ref 135–145)
Total Bilirubin: 0.5 mg/dL (ref 0.3–1.2)
Total Protein: 6.8 g/dL (ref 6.5–8.1)

## 2021-05-25 MED ORDER — PROCHLORPERAZINE MALEATE 10 MG PO TABS: 10.0000 mg | ORAL_TABLET | Freq: Four times a day (QID) | ORAL | 2 refills | Status: DC | PRN

## 2021-05-25 MED ORDER — HEPARIN SOD (PORK) LOCK FLUSH 100 UNIT/ML IV SOLN
500.0000 [IU] | Freq: Once | INTRAVENOUS | Status: AC
  Administered 2021-05-25: 500 [IU]

## 2021-05-25 MED ORDER — SODIUM CHLORIDE 0.9% FLUSH
10.0000 mL | Freq: Once | INTRAVENOUS | Status: AC
  Administered 2021-05-25: 10 mL

## 2021-05-25 NOTE — Progress Notes (Signed)
[name]   Telephone:([phone]) [phone] Fax:(336) [phone]   Clinic Follow up Note   Patient Care Team: [name], MD as PCP - General (Family Medicine) Jerline Pain, MD as PCP - Cardiology (Cardiology) End, Harrell Gave, MD as Consulting Physician (Cardiology) Alda Berthold, DO as Consulting Physician (Neurology) Heilingoetter, Tobe Sos, PA-C as Physician Assistant (Physician Assistant) Truitt Merle, MD as Consulting Physician (Oncology) Dwan Bolt, MD as Consulting Physician (General Surgery)  Date of Service:  05/25/2021  CHIEF COMPLAINT: f/u of pancreatic cancer  CURRENT THERAPY:  First line FOLFIRINOX q2 weeks, starting 03/02/21, OXali held with C3 due to neuropathy. Dose reduced to 70 mg/m2 added back with C4, further reduced to 71m/m2 from cycle 7 due to tolerance issue   ASSESSMENT & PLAN:  Robin SALONGAis a 55y.o. female with   1. Adenocarcinoma of the pancreas, cT2N0M1 with properble oligo liver met  -Presented with fatigue, abdominal pain, and jaundice 12/2020 s/p biliary stent x2  -CT 02/08/2021 showed 2.5 cm hypervascular area in pancreatic head adjacent to the common bile duct in direct contact with anterior surface of inferior vena cava, 1.2 cm groundglass right lung nodule, and multiple liver lesions -Baseline CA 19.9 elevated at 72 -EUS showed a masslike region in the pancreatic head where the CBD narrowed with upstream PD dilatation noted.  Endosonographic appearance was suggestive of potential pancreatic adenocarcinoma T2 N0 MX.  Final path showed malignant cells consistent with adenocarcinoma - PET scan showed a hypermetabolic liver lesion in the right lobe, adjacent to right kidney, suspicious for oligo liver metastasis. No other definitive distant mets. This was not visible on ultrasound, Liver biopsy is not feasible   -She began C1 FOLFIRINOX 03/02/21. Her insurance denied prophylactic G-CSF. -s/p cycle 2 she began to develop early  neuropathy, exali held with cycle 3.  -s/p cycle 3 she developed acute abd pain and nausea, with elevated lipase, concerning for acute pancreatitis. She has was hospitalized for hydration and pain management.  -She recovered well, neuropathy resolved.  Able to resume chemo with cycle 4 FOLFIRINOX and dose reduced oxaliplatin on 04/13/21 -restaging CT 04/25/21 showed the hypermetabolic lesions on comparison PET within the head of pancreas and liver were essentially called by CT, stable benign-appearing liver lesions, no progression of the pancreatic mass or adenopathy. -CA 19-9 improving on chemo.  Overall consistent with a good treatment response. -Patient has experienced more side effect from chemotherapy, and would like to take a chemo break.  She is also wondering if she can take radiation.  Due to her metastatic disease in liver, radiation to the pancreatic primary, is a good option. I reviewed with her in detail.  We discussed the option of chemo break and reducing chemo dose.  She agrees. We will hold treatment today to let her recover and restart as scheduled on 06/08/21.   2. 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 -decreased sensation in hands, more so on right -She started B complex and oxalic was held with cycle 3 -Neuropathy resolved, added Oxali back 70 mg/m2 with cycle 4 -She had some episodes of neuropathy with cold exposure only, none recently    3. Right upper quadrant/epigastric abdominal pain -Patient currently being treated with oxycodone 1-2 tablets per day and tylenol if needed. -Denies new or worsening pain -she has increased cramping abdominal pain, likely secondary to irinotecan. Will dose reduce to 130 mg/m2 and add bentyl.   4. Family history for cancer  -Maternal great-grandmother, grandmother, and  2 sisters with breast cancer -The patient has 1 biological daughter -we discussed genetic testing again today. She agrees to referral today. We will plan a virtual visit  and have her labs drawn when she is here for her next treatment.   5. 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    No problem-specific Assessment & Plan notes found for this encounter.   SUMMARY OF ONCOLOGIC HISTORY: Oncology History Overview Note  Cancer Staging Pancreatic cancer ([facility] Staging form: Exocrine Pancreas, AJCC 8th Edition - Clinical stage from 02/09/2021: Stage IV (cT2, cN0, cM1) - Signed by FTruitt Merle MD on 03/16/2021 Total positive nodes: 0    Pancreatic cancer ([place]  01/11/2021 Imaging   CT Abdomen/Pelvis  IMPRESSION: Multiple low-attenuation liver lesions, which cannot be characterized on this unenhanced exam. Abdomen MRI without and with contrast is recommended for further characterization.   Diffuse biliary ductal dilatation, with soft tissue prominence in the region of the pancreatic head. Pancreatic mass cannot be excluded on this unenhanced exam. Recommend abdomen MRI and MRCP without and with contrast for further evaluation.   Colonic diverticulosis, without radiographic evidence of diverticulitis.     01/12/2021 Imaging   MRI ABDOMEN WITHOUT AND WITH CONTRAST (INCLUDING MRCP)  IMPRESSION: Biliary duct distension of both intra and extrahepatic biliary tree with abrupt caliber change but with no visible mass on imaging. Caliber change more abrupt than expected for stricture and the biliary tree was top-normal size at 6 mm in 2017. Endoscopic assessment is suggested to exclude an occult biliary lesion or atypical  stricture.   Lobulated T2 bright lesion in the medial segment of the LEFT hepatic lobe that favors a sclerosed hemangioma. There is some internal septation suggested and the filling pattern is slightly atypical. Consider 3 to six-month follow-up with extended delays following contrast to 7 minutes better assess filling characteristics, differential consideration would include a biliary cystadenoma with septal enhancement.   01/13/2021 Procedure   ERCP under the care of Dr. GCarlean Purl IMPRESSION: 1. Dilation of the common bile duct and main pancreatic duct. 2. Sphincterotomy and placement of a plastic biliary stent.    01/13/2021 Pathology Results   CASE: [id number]   A. COMMON BILE DUCT, STRICTURE, #1, BRUSHING:   FINAL MICROSCOPIC DIAGNOSIS:  - Suspicious for malignancy  - See comment    02/08/2021 Imaging   CT CHEST WITHOUT CONTRAST AND CT OF ABDOMEN AND PELVIS WITH CONTRAST  IMPRESSION: 1. Findings are highly suspicious for a hypovascular mass in the posterior aspect of the head of the pancreas, as detailed above. This appears associated with persistent narrowing of the distal common bile duct and abrupt termination of the pancreatic duct. Common bile duct stent appears appropriately located, but is associated with some persistent dilatation of the common bile duct. 2. 1.2 x 0.7 cm ground-glass attenuation nodule in the right upper lobe. Initial follow-up with CT at 6-12 months is recommended to confirm persistence. If persistent, repeat CT is recommended every 2 years until 5 years of stability has been  established. This recommendation follows the consensus statement: Guidelines for Management of Incidental Pulmonary Nodules Detected on CT Images: From the Fleischner Society 2017; Radiology 2017; 284:228-243. 3. Multiple liver lesions, smallest of which likely represent tiny simple cysts. The larger lesion in the left lobe of the liver has imaging characteristics once  again most suggestive of a cavernous hemangioma. 4. Aortic atherosclerosis, in addition to left anterior descending coronary artery disease. Please note that although the presence of coronary artery calcium documents the presence of coronary artery disease, the severity of this disease and any potential stenosis cannot be assessed on this non-gated CT examination. Assessment for potential risk factor modification, dietary therapy or pharmacologic therapy may be warranted, if clinically indicated. 5. Additional incidental findings, as above.    02/09/2021 Procedure   ERCP under the care of Dr. Rush Landmark   IMPRESSION: Limited images during ERCP demonstrates removal plastic biliary stent and placement of a new metallic biliary stent. Please refer to the dictated operative report for full details of intraoperative findings and procedure.   02/09/2021 Pathology Results   CASE: [id number]  FINAL MICROSCOPIC DIAGNOSIS:  A. PANCREAS, HEAD OF LESION, FINE NEEDLE ASPIRATION:  - Malignant cells consistent with adenocarcinoma    02/09/2021 Procedure   EGD Impression: - No gross lesions in esophagus. Z-line regular, 40 cm from the incisors. - Severe gastritis noted. No other gross lesions in the stomach. Biopsied. - Plastic biliary stent in the duodenum. Removed. Revealed a patent biliary sphincterotomy was found. - No gross lesions in the duodenal bulb, in the first portion of the duodenum and in the second portion of the duodenum. EUS Impression: - Hyperechoic material consistent with sludge was visualized endosonographically in the gallbladder. - There was a suggestion of a stricture in the lower third of the main bile duct leading to dilation in the middle third of the main bile duct and in the upper third of the main bile duct with evidence of hyperechoic material consistent with sludge within the common bile duct. - A mass-like region was identified in the pancreatic head where the  CBD narrowed and where the upstream PD dilation was noted. This is not the most confluent of masses however. Cytology results are pending. However, the endosonographic appearance is suggestive of potential pancreatic adenocarcinoma. This was staged T2 N0 Mx by endosonographic criteria. The staging applies if malignancy is confirmed. Fine needle biopsy performed. - No malignant-appearing lymph nodes were visualized in the celiac region (level 20), peripancreatic region and porta hepatis region.   02/09/2021 Cancer Staging   Staging form: Exocrine Pancreas, AJCC 8th Edition - Clinical stage from 02/09/2021: Stage IV (cT2, cN0, cM1) - Signed by Truitt Merle, MD on 03/16/2021 Total positive nodes: 0   02/13/2021 Initial Diagnosis   Pancreatic cancer ([place])   02/28/2021 Imaging   PET  Signs of pancreatic head mass with suspected hepatic metastatic lesion in the RIGHT hepatic lobe as described.   Equivocal uptake in adjacent lymph nodes, potentially reactive in the setting of biliary stent. Note the biliary stent placement is in the mid to distal common bile duct but appears similar to recent ERCP images.   Stable appearance of ground-glass in the RIGHT upper lobe. This does not display increased FDG uptake and would be atypical for metastatic lesion based on appearance and location. Suggest six-month follow-up for further evaluation.   RIGHT thyroid uptake is slightly asymmetric potentially associated with small nodule. Recommend thyroid ultrasound with biopsy of focal abnormality is seen.(Ref: J Am Coll  Radiol. 2015 Feb;12(2): 143-50).   03/02/2021 -  Chemotherapy   Patient is on Treatment Plan : PANCREAS Modified FOLFIRINOX q14d x 4 cycles     04/25/2021 Imaging   CT AP w contrast IMPRESSION: 1. No evidence of pancreatic carcinoma progression by CT imaging. 2. Hypermetabolic lesions on comparison PET scan within the head of the pancreas and liver were essentially occult by CT  imaging. Consider follow-up FDG PET scan at some future point. 3. Multiple small benign-appearing hypodense lesions in the liver and benign hepatic hemangioma unchanged from prior. 4. No evidence of lymphadenopathy      INTERVAL HISTORY:  Robin [name] is here for a follow up of pancreatic cancer. She was last seen by NP Lacie on 05/11/21. She presents to the clinic alone. She reports having increased trouble with chemo-- cold sensitivity lasts longer, increased abdominal pain, increased fatigue. She notes the cold sensitivity in her fingers will go away, but she still has sensitivity in her throat when she drinks cold drinks. She reports the abdominal pain does not seem to correlate with anything and will sometimes spread to her back but not always. She notes she is only using up to two pain medication doses a day-- she takes a hydrocodone first, and if this doesn't relieve the pain, she will take an oxycodone after 6 hours. She reports her energy level will recover to 80% after chemo (such as today), but it drops to below 50% following treatment. She notes she takes off work for the infusion, then Friday through Sunday, and she will be back to work on Monday.   All other systems were reviewed with the patient and are negative.  MEDICAL HISTORY:  Past Medical History:  Diagnosis Date   Asthma    Bipolar disorder ([place])    Diverticulosis    Gestational hypertension    HA (headache)    History of borderline personality disorder    Hypersomnia, persistent 04/29/2013   IBS (irritable bowel syndrome)    Memory loss, short term    [name] 04-03-13    Migraine    Pancreatic cancer ([place])    Reflux esophagitis    Seasonal allergies     SURGICAL HISTORY: Past Surgical History:  Procedure Laterality Date   ABDOMINAL HYSTERECTOMY  2000   BILIARY BRUSHING  01/13/2021   Procedure: BILIARY BRUSHING;  Surgeon: Gatha Mayer, MD;  Location: [facility];  Service: Endoscopy;;   BILIARY  DILATION  02/09/2021   Procedure: BILIARY DILATION;  Surgeon: Irving Copas., MD;  Location: [place];  Service: Gastroenterology;;   BILIARY STENT PLACEMENT N/A 01/13/2021   Procedure: BILIARY STENT PLACEMENT;  Surgeon: Gatha Mayer, MD;  Location: [facility];  Service: Endoscopy;  Laterality: N/A;   BILIARY STENT PLACEMENT  02/09/2021   Procedure: BILIARY STENT PLACEMENT;  Surgeon: Irving Copas., MD;  Location: [place];  Service: Gastroenterology;;   BIOPSY  01/13/2021   Procedure: BIOPSY;  Surgeon: Gatha Mayer, MD;  Location: [facility];  Service: Endoscopy;;   BIOPSY  02/09/2021   Procedure: BIOPSY;  Surgeon: Irving Copas., MD;  Location: [facility];  Service: Gastroenterology;;   COLONOSCOPY  01/2020   ERCP N/A 01/13/2021   Procedure: ENDOSCOPIC RETROGRADE CHOLANGIOPANCREATOGRAPHY (ERCP);  Surgeon: Gatha Mayer, MD;  Location: [facility];  Service: Endoscopy;  Laterality: N/A;   ERCP N/A 02/09/2021   Procedure: ENDOSCOPIC RETROGRADE CHOLANGIOPANCREATOGRAPHY (ERCP);  Surgeon: Irving Copas., MD;  Location: [place];  Service: Gastroenterology;  Laterality: N/A;   ESOPHAGOGASTRODUODENOSCOPY (EGD) WITH PROPOFOL N/A 02/09/2021   Procedure: ESOPHAGOGASTRODUODENOSCOPY (EGD) WITH PROPOFOL;  Surgeon: Rush Landmark Telford Nab., MD;  Location: [place];  Service: Gastroenterology;  Laterality: N/A;   EUS N/A 02/09/2021   Procedure: UPPER ENDOSCOPIC ULTRASOUND (EUS) LINEAR;  Surgeon: Irving Copas., MD;  Location: [place];  Service: Gastroenterology;  Laterality: N/A;   FINE NEEDLE ASPIRATION  02/09/2021   Procedure: FINE NEEDLE ASPIRATION (FNA) LINEAR;  Surgeon: Irving Copas., MD;  Location: [place];  Service: Gastroenterology;;   KNEE SURGERY Right 96,98,00,02,05,2014   NASAL SINUS SURGERY     NOSE SURGERY     [phone]   PORTACATH PLACEMENT Right 02/17/2021   Procedure: INSERTION PORT-A-CATH;   Surgeon: Dwan Bolt, MD;  Location: [facility];  Service: General;  Laterality: Right;  LMA ANESTHESIA   REPLACEMENT TOTAL KNEE Right    SPHINCTEROTOMY  01/13/2021   Procedure: SPHINCTEROTOMY;  Surgeon: Gatha Mayer, MD;  Location: [facility];  Service: Endoscopy;;   STENT REMOVAL  02/09/2021   Procedure: Lavell Islam REMOVAL;  Surgeon: Irving Copas., MD;  Location: [place];  Service: Gastroenterology;;   WRIST SURGERY Right [phone]    I have reviewed the social history and family history with the patient and they are unchanged from previous note.  ALLERGIES:  is allergic to aspirin, bee venom, penicillin g, penicillins, bactrim [sulfamethoxazole-trimethoprim], coconut oil, sulfa antibiotics, and sulfamethoxazole-trimethoprim.  MEDICATIONS:  Current Outpatient Medications  Medication Sig Dispense Refill   acetaminophen (TYLENOL) 325 MG tablet Take 325-650 mg by mouth every 6 (six) hours as needed for mild pain or headache.     albuterol (VENTOLIN HFA) 108 (90 Base) MCG/ACT inhaler Inhale 2 puffs into the lungs every 6 (six) hours as needed for wheezing or shortness of breath.     cyclobenzaprine (FLEXERIL) 5 MG tablet TAKE 1 TABLET(5 MG) BY MOUTH AT BEDTIME AS NEEDED FOR LOWER BACK PAIN OR SEVERE HEADACHE (Patient taking differently: Take 5 mg by mouth at bedtime as needed (for lower back pain or severe headaches).) 30 tablet 0   dicyclomine (BENTYL) 10 MG capsule Take 1 capsule (10 mg total) by mouth 4 (four) times daily -  before meals and at bedtime. 30 capsule 1   diphenoxylate-atropine (LOMOTIL) 2.5-0.025 MG tablet Take 1-2 tabs by mouth 4 times a day as needed for loose stools. 30 tablet 0   EPINEPHrine 0.3 mg/0.3 mL IJ SOAJ injection Inject 0.3 mg into the muscle as needed for anaphylaxis. INJECT 0.3 MLS (0.3 MG TOTAL) INTO THE MUSCLE ONCE (Patient taking differently: Inject 0.3 mg into the muscle once as needed for anaphylaxis.) 1 each 0   fluticasone (FLONASE) 50  MCG/ACT nasal spray Place 2 sprays into both nostrils daily.     gabapentin (NEURONTIN) 300 MG capsule Take 300 mg by mouth 3 (three) times daily as needed (for neuropathic pain).     HYDROcodone-acetaminophen (NORCO/VICODIN) 5-325 MG tablet Take 1 tablet by mouth every 6 (six) hours as needed for moderate pain or severe pain.     LATUDA 40 MG TABS tablet Take 1 tablet (40 mg total) by mouth daily with breakfast. 90 tablet 0   lidocaine-prilocaine (EMLA) cream Apply to affected area once (Patient taking differently: Apply 1 application topically as directed.) 30 g 3   magic mouthwash w/lidocaine SOLN Take 10 mLs by mouth 4 (four) times daily as needed for mouth pain. 10 mls swish and spit by mouth four times daily as needed 470 mL 2  montelukast (SINGULAIR) 10 MG tablet Take 10 mg by mouth daily.     ondansetron (ZOFRAN) 8 MG tablet Take 1 tablet (8 mg total) by mouth every 8 (eight) hours as needed for nausea or vomiting. Starting on day 3 after chemotherapy 30 tablet 2   oxyCODONE (OXY IR/ROXICODONE) 5 MG immediate release tablet Take 1-2 tablets (5-10 mg total) by mouth every 6 (six) hours as needed for severe pain. 90 tablet 0   PRESCRIPTION MEDICATION Inject into the vein See admin instructions. FOLFIRINOX- Every 14 days for 8 treatments     prochlorperazine (COMPAZINE) 10 MG tablet Take 1 tablet (10 mg total) by mouth every 6 (six) hours as needed for vomiting or nausea. 60 tablet 2   promethazine (PHENERGAN) 25 MG tablet Take 25 mg by mouth every 6 (six) hours as needed for vomiting or nausea.     Pyridoxine HCl (VITAMIN B6 PO) Take by mouth.     No current facility-administered medications for this visit.    PHYSICAL EXAMINATION: ECOG PERFORMANCE STATUS: 1 - Symptomatic but completely ambulatory  Vitals:   05/25/21 0913  BP: 137/77  Pulse: 67  Resp: 18  Temp: 98 F (36.7 C)  SpO2: 100%   Wt Readings from Last 3 Encounters:  05/25/21 203 lb 6.4 oz (92.3 kg)  05/22/21 207 lb  (93.9 kg)  05/11/21 206 lb (93.4 kg)     GENERAL:alert, no distress and comfortable SKIN: skin color normal, no rashes or significant lesions EYES: normal, Conjunctiva are pink and non-injected, sclera clear  NEURO: alert & oriented x 3 with fluent speech  LABORATORY DATA:  I have reviewed the data as listed CBC Latest Ref Rng & Units 05/25/2021 05/11/2021 04/27/2021  WBC 4.0 - 10.5 K/uL 4.7 5.7 5.7  Hemoglobin 12.0 - 15.0 g/dL 11.3(L) 11.9(L) 11.0(L)  Hematocrit 36.0 - 46.0 % 35.1(L) 37.2 34.4(L)  Platelets 150 - 400 K/uL 151 163 207     CMP Latest Ref Rng & Units 05/25/2021 05/11/2021 04/27/2021  Glucose 70 - 99 mg/dL 107(H) 89 101(H)  BUN 6 - 20 mg/dL _0 Creatinine 0.44 - 1.00 mg/dL 1.05(H) 0.91 1.00  Sodium 135 - 145 mmol/L 144 141 145  Potassium 3.5 - 5.1 mmol/L 4.2 4.1 4.0  Chloride 98 - 111 mmol/L 111 109 111  CO2 22 - 32 mmol/L _1 Calcium 8.9 - 10.3 mg/dL 9.3 9.8 9.9  Total Protein 6.5 - 8.1 g/dL 6.8 7.2 6.6  Total Bilirubin 0.3 - 1.2 mg/dL 0.5 0.6 0.4  Alkaline Phos 38 - 126 U/L 58 65 55  AST 15 - 41 U/L 31 46(H) 26  ALT 0 - 44 U/L 35 67(H) 26      RADIOGRAPHIC STUDIES: I have personally reviewed the radiological images as listed and agreed with the findings in the report. No results found.    Orders Placed This Encounter  Procedures   Ambulatory referral to Genetics    Referral Priority:   Routine    Referral Type:   Consultation    Referral Reason:   Specialty Services Required    Number of Visits Requested:   1   All questions were answered. The patient knows to call the clinic with any problems, questions or concerns. No barriers to learning was detected. The total time spent in the appointment was 30 minutes.     Truitt Merle, MD 05/25/2021   I, Wilburn Mylar, am acting as scribe for Truitt Merle, MD.   I  have reviewed the above documentation for accuracy and completeness, and I agree with the above.

## 2021-06-02 ENCOUNTER — Encounter: Payer: Self-pay | Admitting: Hematology

## 2021-06-07 MED FILL — Fosaprepitant Dimeglumine For IV Infusion 150 MG (Base Eq): INTRAVENOUS | Qty: 5 | Status: AC

## 2021-06-07 MED FILL — Dexamethasone Sodium Phosphate Inj 100 MG/10ML: INTRAMUSCULAR | Qty: 1 | Status: AC

## 2021-06-08 ENCOUNTER — Inpatient Hospital Stay (HOSPITAL_BASED_OUTPATIENT_CLINIC_OR_DEPARTMENT_OTHER): Payer: 59 | Admitting: Genetic Counselor

## 2021-06-08 ENCOUNTER — Inpatient Hospital Stay: Payer: 59

## 2021-06-08 ENCOUNTER — Other Ambulatory Visit: Payer: Self-pay

## 2021-06-08 ENCOUNTER — Other Ambulatory Visit: Payer: Self-pay | Admitting: Genetic Counselor

## 2021-06-08 ENCOUNTER — Inpatient Hospital Stay (HOSPITAL_BASED_OUTPATIENT_CLINIC_OR_DEPARTMENT_OTHER): Payer: 59 | Admitting: Hematology

## 2021-06-08 ENCOUNTER — Inpatient Hospital Stay: Payer: 59 | Attending: Physician Assistant

## 2021-06-08 VITALS — BP 132/81 | HR 73 | Temp 98.2°F | Resp 18 | Ht 69.0 in | Wt 204.0 lb

## 2021-06-08 DIAGNOSIS — C250 Malignant neoplasm of head of pancreas: Secondary | ICD-10-CM | POA: Diagnosis present

## 2021-06-08 DIAGNOSIS — G620 Drug-induced polyneuropathy: Secondary | ICD-10-CM | POA: Diagnosis not present

## 2021-06-08 DIAGNOSIS — Z5111 Encounter for antineoplastic chemotherapy: Secondary | ICD-10-CM | POA: Diagnosis not present

## 2021-06-08 DIAGNOSIS — C787 Secondary malignant neoplasm of liver and intrahepatic bile duct: Secondary | ICD-10-CM | POA: Insufficient documentation

## 2021-06-08 DIAGNOSIS — Z803 Family history of malignant neoplasm of breast: Secondary | ICD-10-CM | POA: Diagnosis not present

## 2021-06-08 DIAGNOSIS — Z79899 Other long term (current) drug therapy: Secondary | ICD-10-CM | POA: Diagnosis not present

## 2021-06-08 DIAGNOSIS — T451X5A Adverse effect of antineoplastic and immunosuppressive drugs, initial encounter: Secondary | ICD-10-CM | POA: Diagnosis not present

## 2021-06-08 DIAGNOSIS — R1013 Epigastric pain: Secondary | ICD-10-CM | POA: Insufficient documentation

## 2021-06-08 DIAGNOSIS — Z95828 Presence of other vascular implants and grafts: Secondary | ICD-10-CM

## 2021-06-08 LAB — CMP (CANCER CENTER ONLY)
ALT: 24 U/L (ref 0–44)
AST: 24 U/L (ref 15–41)
Albumin: 4.1 g/dL (ref 3.5–5.0)
Alkaline Phosphatase: 62 U/L (ref 38–126)
Anion gap: 7 (ref 5–15)
BUN: 16 mg/dL (ref 6–20)
CO2: 26 mmol/L (ref 22–32)
Calcium: 9.6 mg/dL (ref 8.9–10.3)
Chloride: 106 mmol/L (ref 98–111)
Creatinine: 0.8 mg/dL (ref 0.44–1.00)
GFR, Estimated: >60 mL/min (ref >60)
Glucose, Bld: 91 mg/dL (ref 70–99)
Potassium: 4 mmol/L (ref 3.5–5.1)
Sodium: 139 mmol/L (ref 135–145)
Total Bilirubin: 0.8 mg/dL (ref 0.3–1.2)
Total Protein: 7.4 g/dL (ref 6.5–8.1)

## 2021-06-08 LAB — CBC WITH DIFFERENTIAL (CANCER CENTER ONLY)
Abs Immature Granulocytes: 0.03 10*3/uL (ref 0.00–0.07)
Basophils Absolute: 0 10*3/uL (ref 0.0–0.1)
Basophils Relative: 1 %
Eosinophils Absolute: 0.1 10*3/uL (ref 0.0–0.5)
Eosinophils Relative: 1 %
HCT: 39.5 % (ref 36.0–46.0)
Hemoglobin: 12.4 g/dL (ref 12.0–15.0)
Immature Granulocytes: 0 %
Lymphocytes Relative: 37 %
Lymphs Abs: 2.8 10*3/uL (ref 0.7–4.0)
MCH: 28.2 pg (ref 26.0–34.0)
MCHC: 31.4 g/dL (ref 30.0–36.0)
MCV: 90 fL (ref 80.0–100.0)
Monocytes Absolute: 0.7 10*3/uL (ref 0.1–1.0)
Monocytes Relative: 9 %
Neutro Abs: 4.1 10*3/uL (ref 1.7–7.7)
Neutrophils Relative %: 52 %
Platelet Count: 196 10*3/uL (ref 150–400)
RBC: 4.39 MIL/uL (ref 3.87–5.11)
RDW: 16 % — ABNORMAL HIGH (ref 11.5–15.5)
WBC Count: 7.7 10*3/uL (ref 4.0–10.5)
nRBC: 0 % (ref 0.0–0.2)

## 2021-06-08 MED ORDER — PALONOSETRON HCL INJECTION 0.25 MG/5ML
0.2500 mg | Freq: Once | INTRAVENOUS | Status: AC
  Administered 2021-06-08: 0.25 mg via INTRAVENOUS
  Filled 2021-06-08: qty 5

## 2021-06-08 MED ORDER — FLUOROURACIL CHEMO INJECTION 5 GM/100ML
2348.0000 mg/m2 | INTRAVENOUS | Status: DC
  Administered 2021-06-08: 5000 mg via INTRAVENOUS
  Filled 2021-06-08: qty 100

## 2021-06-08 MED ORDER — HEPARIN SOD (PORK) LOCK FLUSH 100 UNIT/ML IV SOLN: 500.0000 [IU] | Freq: Once | INTRAVENOUS | Status: DC | PRN

## 2021-06-08 MED ORDER — OXALIPLATIN CHEMO INJECTION 100 MG/20ML
50.0000 mg/m2 | Freq: Once | INTRAVENOUS | Status: AC
  Administered 2021-06-08: 105 mg via INTRAVENOUS
  Filled 2021-06-08: qty 20

## 2021-06-08 MED ORDER — ATROPINE SULFATE 1 MG/ML IV SOLN
0.5000 mg | Freq: Once | INTRAVENOUS | Status: AC | PRN
  Administered 2021-06-08: 0.5 mg via INTRAVENOUS
  Filled 2021-06-08: qty 1

## 2021-06-08 MED ORDER — SODIUM CHLORIDE 0.9 % IV SOLN
10.0000 mg | Freq: Once | INTRAVENOUS | Status: AC
  Administered 2021-06-08: 10 mg via INTRAVENOUS
  Filled 2021-06-08: qty 10
  Filled 2021-06-08: qty 1

## 2021-06-08 MED ORDER — SODIUM CHLORIDE 0.9 % IV SOLN
130.0000 mg/m2 | Freq: Once | INTRAVENOUS | Status: AC
  Administered 2021-06-08: 280 mg via INTRAVENOUS
  Filled 2021-06-08: qty 14

## 2021-06-08 MED ORDER — SODIUM CHLORIDE 0.9% FLUSH: 10.0000 mL | INTRAVENOUS | Status: DC | PRN

## 2021-06-08 MED ORDER — SODIUM CHLORIDE 0.9 % IV SOLN
400.0000 mg/m2 | Freq: Once | INTRAVENOUS | Status: AC
  Administered 2021-06-08: 856 mg via INTRAVENOUS
  Filled 2021-06-08: qty 42.8

## 2021-06-08 MED ORDER — SODIUM CHLORIDE 0.9% FLUSH
10.0000 mL | Freq: Once | INTRAVENOUS | Status: AC
  Administered 2021-06-08: 10 mL

## 2021-06-08 MED ORDER — SODIUM CHLORIDE 0.9 % IV SOLN
150.0000 mg | Freq: Once | INTRAVENOUS | Status: AC
  Administered 2021-06-08: 150 mg via INTRAVENOUS
  Filled 2021-06-08: qty 5
  Filled 2021-06-08: qty 150

## 2021-06-08 MED ORDER — DEXTROSE 5 % IV SOLN: Freq: Once | INTRAVENOUS | Status: AC

## 2021-06-08 NOTE — Progress Notes (Signed)
[facility]   Telephone:([phone]) [phone] [phone]   Clinic Follow up Note   Patient Care Team: [name], MD as PCP - General (Family Medicine) Jerline Pain, MD as PCP - Cardiology (Cardiology) End, Harrell Gave, MD as Consulting Physician (Cardiology) Alda Berthold, DO as Consulting Physician (Neurology) Heilingoetter, Tobe Sos, PA-C as Physician Assistant (Physician Assistant) Truitt Merle, MD as Consulting Physician (Oncology) Dwan Bolt, MD as Consulting Physician (General Surgery)  Date of Service:  06/08/2021  CHIEF COMPLAINT: f/u of pancreatic cancer  CURRENT THERAPY:  First line FOLFIRINOX q2 weeks, starting 03/02/21, OXali held with C3 due to neuropathy. Dose reduced to 70 mg/m2 added back with C4, further reduced to 12m/m2 from cycle 7 due to tolerance issue  ASSESSMENT & PLAN:  Robin JANSis a 55y.o. female with   1. Adenocarcinoma of the pancreas, cT2N0M1 with probable oligo liver met  -Presented with fatigue, abdominal pain, and jaundice 12/2020 s/p biliary stent x2  -CT 02/08/2021 showed 2.5 cm hypervascular area in pancreatic head adjacent to the common bile duct in direct contact with anterior surface of inferior vena cava, 1.2 cm groundglass right lung nodule, and multiple liver lesions -Baseline CA 19.9 elevated at 72 -EUS showed a masslike region in the pancreatic head where the CBD narrowed with upstream PD dilatation noted.  Endosonographic appearance was suggestive of potential pancreatic adenocarcinoma T2 N0 MX.  Final path showed malignant cells consistent with adenocarcinoma - PET scan showed a hypermetabolic liver lesion in the right lobe, adjacent to right kidney, suspicious for oligo liver metastasis. No other definitive distant mets. This was not visible on ultrasound, Liver biopsy is not feasible   -She began C1 FOLFIRINOX 03/02/21. Her insurance denied prophylactic G-CSF. -s/p cycle 2 she began to develop early  neuropathy, exali held with cycle 3.  -s/p cycle 3 she developed acute abd pain and nausea, with elevated lipase, concerning for acute pancreatitis. She has was hospitalized for hydration and pain management.  -She recovered well, neuropathy resolved.  Able to resume chemo with cycle 4 FOLFIRINOX and dose reduced oxaliplatin on 04/13/21 -restaging CT 04/25/21 showed the hypermetabolic lesions on comparison PET within the head of pancreas and liver were essentially called by CT, stable benign-appearing liver lesions, no progression of the pancreatic mass or adenopathy. -CA 19-9 improving on chemo.  Overall consistent with a good treatment response. -we cancelled her last treatment to give her additional time to recover. She feels she has recovered well, aside from neuropathy symptoms. I will reduce her Oxali dose from today.  -labs reviewed, overall adequate to restart treatment. She is agreeable to continuing treatment every two weeks at this time.   2. 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 -decreased sensation in hands, more so on right -She started B complex and oxalic was held with cycle 3 -Neuropathy resolved, added Oxali back 70 mg/m2 with cycle 4 -She continues to have neuropathy/cold sensitivity after chemo, which she notes lasts longer with each cycle. I reduced her Oxali dose today (C7, 06/08/21)   3. Right upper quadrant/epigastric abdominal pain -Patient currently being treated with oxycodone 1-2 tablets per day and tylenol if needed. -Denies new or worsening pain -she has increased cramping abdominal pain, likely secondary to irinotecan. Will dose reduce to 130 mg/m2 and add bentyl.   4. Family history for cancer  -Maternal great-grandmother, grandmother, and 2 sisters with breast cancer -The patient has 1 biological daughter -her genetic testing labs were drawn today. Results  pending.   5. 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 No GCSF -lab, flush, f/u, and next cycle in 2 weeks    No problem-specific Assessment & Plan notes found for this encounter.   SUMMARY OF ONCOLOGIC HISTORY: Oncology History Overview Note  Cancer Staging Pancreatic cancer ([facility] Staging form: Exocrine Pancreas, AJCC 8th Edition - Clinical stage from 02/09/2021: Stage IV (cT2, cN0, cM1) - Signed by FTruitt Merle MD on 03/16/2021 Total positive nodes: 0    Pancreatic cancer ([place]  01/11/2021 Imaging   CT Abdomen/Pelvis  IMPRESSION: Multiple low-attenuation liver lesions, which cannot be characterized on this unenhanced exam. Abdomen MRI without and with contrast is recommended for further characterization.   Diffuse biliary ductal dilatation, with soft tissue prominence in the region of the pancreatic head. Pancreatic mass cannot be excluded on this unenhanced exam. Recommend abdomen MRI and MRCP without and with contrast for further evaluation.   Colonic diverticulosis, without radiographic evidence of diverticulitis.     01/12/2021 Imaging   MRI ABDOMEN WITHOUT AND WITH CONTRAST (INCLUDING MRCP)  IMPRESSION: Biliary duct distension of both intra and extrahepatic biliary tree with abrupt caliber change but with no visible mass on imaging. Caliber change more abrupt than expected for stricture and the biliary tree was top-normal size at 6 mm in 2017. Endoscopic assessment is suggested to exclude an occult biliary lesion or atypical stricture.   Lobulated T2 bright lesion in the medial segment of the LEFT hepatic lobe that favors a sclerosed hemangioma. There is some internal septation suggested and the filling pattern is slightly atypical. Consider 3 to six-month follow-up with  extended delays following contrast to 7 minutes better assess filling characteristics, differential consideration would include a biliary cystadenoma with septal enhancement.   01/13/2021 Procedure   ERCP under the care of Dr. GCarlean Purl IMPRESSION: 1. Dilation of the common bile duct and main pancreatic duct. 2. Sphincterotomy and placement of a plastic biliary stent.    01/13/2021 Pathology Results   CASE: [id number]   A. COMMON BILE DUCT, STRICTURE, #1, BRUSHING:   FINAL MICROSCOPIC DIAGNOSIS:  - Suspicious for malignancy  - See comment    02/08/2021 Imaging   CT CHEST WITHOUT CONTRAST AND CT OF ABDOMEN AND PELVIS WITH CONTRAST  IMPRESSION: 1. Findings are highly suspicious for a hypovascular mass in the posterior aspect of the head of the pancreas, as detailed above. This appears associated with persistent narrowing of the distal common bile duct and abrupt termination of the pancreatic duct. Common bile duct stent appears appropriately located, but is associated with some persistent dilatation of the common bile duct. 2. 1.2 x 0.7 cm ground-glass attenuation nodule in the right upper lobe. Initial follow-up with CT at 6-12 months is recommended to confirm persistence. If persistent, repeat CT is recommended every 2 years until 5 years of stability has been established. This recommendation follows the consensus statement: Guidelines for Management of Incidental Pulmonary Nodules Detected on CT Images: From the Fleischner Society 2017; Radiology 2017; 284:228-243. 3. Multiple liver lesions, smallest of which likely represent tiny simple cysts. The larger lesion in the left lobe of the liver has imaging characteristics once again most  suggestive of a cavernous hemangioma. 4. Aortic atherosclerosis, in addition to left anterior descending coronary artery disease. Please note that although the presence of coronary artery calcium documents the presence of coronary  artery disease, the severity of this disease and any potential stenosis cannot be assessed on this non-gated CT examination. Assessment for potential risk factor modification, dietary therapy or pharmacologic therapy may be warranted, if clinically indicated. 5. Additional incidental findings, as above.    02/09/2021 Procedure   ERCP under the care of Dr. Rush Landmark   IMPRESSION: Limited images during ERCP demonstrates removal plastic biliary stent and placement of a new metallic biliary stent. Please refer to the dictated operative report for full details of intraoperative findings and procedure.   02/09/2021 Pathology Results   CASE: [id number]  FINAL MICROSCOPIC DIAGNOSIS:  A. PANCREAS, HEAD OF LESION, FINE NEEDLE ASPIRATION:  - Malignant cells consistent with adenocarcinoma    02/09/2021 Procedure   EGD Impression: - No gross lesions in esophagus. Z-line regular, 40 cm from the incisors. - Severe gastritis noted. No other gross lesions in the stomach. Biopsied. - Plastic biliary stent in the duodenum. Removed. Revealed a patent biliary sphincterotomy was found. - No gross lesions in the duodenal bulb, in the first portion of the duodenum and in the second portion of the duodenum. EUS Impression: - Hyperechoic material consistent with sludge was visualized endosonographically in the gallbladder. - There was a suggestion of a stricture in the lower third of the main bile duct leading to dilation in the middle third of the main bile duct and in the upper third of the main bile duct with evidence of hyperechoic material consistent with sludge within the common bile duct. - A mass-like region was identified in the pancreatic head where the CBD narrowed and where the upstream PD dilation was noted. This is not the most confluent of masses however. Cytology results are pending. However, the endosonographic appearance is suggestive of potential pancreatic adenocarcinoma. This was  staged T2 N0 Mx by endosonographic criteria. The staging applies if malignancy is confirmed. Fine needle biopsy performed. - No malignant-appearing lymph nodes were visualized in the celiac region (level 20), peripancreatic region and porta hepatis region.   02/09/2021 Cancer Staging   Staging form: Exocrine Pancreas, AJCC 8th Edition - Clinical stage from 02/09/2021: Stage IV (cT2, cN0, cM1) - Signed by Truitt Merle, MD on 03/16/2021 Total positive nodes: 0   02/13/2021 Initial Diagnosis   Pancreatic cancer ([place])   02/28/2021 Imaging   PET  Signs of pancreatic head mass with suspected hepatic metastatic lesion in the RIGHT hepatic lobe as described.   Equivocal uptake in adjacent lymph nodes, potentially reactive in the setting of biliary stent. Note the biliary stent placement is in the mid to distal common bile duct but appears similar to recent ERCP images.   Stable appearance of ground-glass in the RIGHT upper lobe. This does not display increased FDG uptake and would be atypical for metastatic lesion based on appearance and location. Suggest six-month follow-up for further evaluation.   RIGHT thyroid uptake is slightly asymmetric potentially associated with small nodule. Recommend thyroid ultrasound with biopsy of focal abnormality is seen.(Ref: J Am Coll Radiol. 2015 Feb;12(2): 143-50).   03/02/2021 -  Chemotherapy   Patient is on Treatment Plan : PANCREAS Modified FOLFIRINOX q14d x 4 cycles     04/25/2021 Imaging   CT AP w contrast IMPRESSION: 1. No evidence of pancreatic carcinoma progression by CT imaging. 2. Hypermetabolic lesions on comparison PET  scan within the head of the pancreas and liver were essentially occult by CT imaging. Consider follow-up FDG PET scan at some future point. 3. Multiple small benign-appearing hypodense lesions in the liver and benign hepatic hemangioma unchanged from prior. 4. No evidence of lymphadenopathy      INTERVAL HISTORY:  Robin [name] is here for a follow up of pancreatic cancer. She was last seen by me on 05/25/21. She presents to the clinic alone. Her wife is on speakerphone. She reports she is feeling a little better. She reports the pain and the nausea both continue to come and go. She notes she is eating well. She reports the numbness/cold sensitivity to her fingers and toes is the worst part. She notes the sensation takes longer to resolve with each treatment. She reports the numbness continues from her last cycle 4 weeks ago.   All other systems were reviewed with the patient and are negative.  MEDICAL HISTORY:  Past Medical History:  Diagnosis Date   Asthma    Bipolar disorder ([place])    Diverticulosis    Gestational hypertension    HA (headache)    History of borderline personality disorder    Hypersomnia, persistent 04/29/2013   IBS (irritable bowel syndrome)    Memory loss, short term    [name] 04-03-13    Migraine    Pancreatic cancer ([place])    Reflux esophagitis    Seasonal allergies     SURGICAL HISTORY: Past Surgical History:  Procedure Laterality Date   ABDOMINAL HYSTERECTOMY  2000   BILIARY BRUSHING  01/13/2021   Procedure: BILIARY BRUSHING;  Surgeon: Gatha Mayer, MD;  Location: [facility];  Service: Endoscopy;;   BILIARY DILATION  02/09/2021   Procedure: BILIARY DILATION;  Surgeon: Irving Copas., MD;  Location: [facility];  Service: Gastroenterology;;   BILIARY STENT PLACEMENT N/A 01/13/2021   Procedure: BILIARY STENT PLACEMENT;  Surgeon: Gatha Mayer, MD;  Location: [facility];  Service: Endoscopy;  Laterality: N/A;   BILIARY STENT PLACEMENT  02/09/2021   Procedure: BILIARY STENT PLACEMENT;  Surgeon: Irving Copas., MD;  Location: [facility];  Service: Gastroenterology;;   BIOPSY  01/13/2021   Procedure: BIOPSY;  Surgeon: Gatha Mayer, MD;  Location: [facility];  Service: Endoscopy;;   BIOPSY  02/09/2021   Procedure: BIOPSY;  Surgeon: Irving Copas., MD;  [facility];  Service: Gastroenterology;;   COLONOSCOPY  01/2020   ERCP N/A 01/13/2021   Procedure: ENDOSCOPIC RETROGRADE CHOLANGIOPANCREATOGRAPHY (ERCP);  Surgeon: Gatha Mayer, MD;  Location: [facility];  Service: Endoscopy;  Laterality: N/A;   ERCP N/A 02/09/2021   Procedure: ENDOSCOPIC RETROGRADE CHOLANGIOPANCREATOGRAPHY (ERCP);  Surgeon: Irving Copas., MD;  Location: [facility];  Service: Gastroenterology;  Laterality: N/A;   ESOPHAGOGASTRODUODENOSCOPY (EGD) WITH PROPOFOL N/A 02/09/2021   Procedure: ESOPHAGOGASTRODUODENOSCOPY (EGD) WITH PROPOFOL;  Surgeon: Rush Landmark Telford Nab., MD;  Location: [facility];  Service: Gastroenterology;  Laterality: N/A;   EUS N/A 02/09/2021   Procedure: UPPER ENDOSCOPIC ULTRASOUND (EUS) LINEAR;  Surgeon: Irving Copas., MD;  Location: [facility];  Service: Gastroenterology;  Laterality: N/A;   FINE NEEDLE ASPIRATION  02/09/2021   Procedure: FINE NEEDLE ASPIRATION (FNA) LINEAR;  Surgeon: Irving Copas., MD;  Location: [facility] ENDOSCOPY;  Service: Gastroenterology;;   KNEE SURGERY Right 96,98,00,02,05,2014   NASAL SINUS SURGERY     NOSE SURGERY     [phone]   PORTACATH PLACEMENT Right 02/17/2021   Procedure: INSERTION PORT-A-CATH;  Surgeon: Zenia Resides,  Blanchard Mane, MD;  Location: [facility];  Service: General;  Laterality: Right;  LMA ANESTHESIA   REPLACEMENT TOTAL KNEE Right    SPHINCTEROTOMY  01/13/2021   Procedure: SPHINCTEROTOMY;  Surgeon: Gatha Mayer, MD;  Location: [facility];  Service: Endoscopy;;   STENT REMOVAL  02/09/2021   Procedure: STENT REMOVAL;  Surgeon: Irving Copas., MD;  Location: [facility];  Service: Gastroenterology;;   WRIST SURGERY Right [phone]    I have reviewed the social history and family history with the patient and they are unchanged from previous note.  ALLERGIES:  is allergic to aspirin, bee venom, penicillin g, penicillins, bactrim  [sulfamethoxazole-trimethoprim], coconut oil, sulfa antibiotics, and sulfamethoxazole-trimethoprim.  MEDICATIONS:  Current Outpatient Medications  Medication Sig Dispense Refill   acetaminophen (TYLENOL) 325 MG tablet Take 325-650 mg by mouth every 6 (six) hours as needed for mild pain or headache.     albuterol (VENTOLIN HFA) 108 (90 Base) MCG/ACT inhaler Inhale 2 puffs into the lungs every 6 (six) hours as needed for wheezing or shortness of breath.     cyclobenzaprine (FLEXERIL) 5 MG tablet TAKE 1 TABLET(5 MG) BY MOUTH AT BEDTIME AS NEEDED FOR LOWER BACK PAIN OR SEVERE HEADACHE (Patient taking differently: Take 5 mg by mouth at bedtime as needed (for lower back pain or severe headaches).) 30 tablet 0   dicyclomine (BENTYL) 10 MG capsule Take 1 capsule (10 mg total) by mouth 4 (four) times daily -  before meals and at bedtime. 30 capsule 1   diphenoxylate-atropine (LOMOTIL) 2.5-0.025 MG tablet Take 1-2 tabs by mouth 4 times a day as needed for loose stools. 30 tablet 0   EPINEPHrine 0.3 mg/0.3 mL IJ SOAJ injection Inject 0.3 mg into the muscle as needed for anaphylaxis. INJECT 0.3 MLS (0.3 MG TOTAL) INTO THE MUSCLE ONCE (Patient taking differently: Inject 0.3 mg into the muscle once as needed for anaphylaxis.) 1 each 0   fluticasone (FLONASE) 50 MCG/ACT nasal spray Place 2 sprays into both nostrils daily.     gabapentin (NEURONTIN) 300 MG capsule Take 300 mg by mouth 3 (three) times daily as needed (for neuropathic pain).     HYDROcodone-acetaminophen (NORCO/VICODIN) 5-325 MG tablet Take 1 tablet by mouth every 6 (six) hours as needed for moderate pain or severe pain.     LATUDA 40 MG TABS tablet Take 1 tablet (40 mg total) by mouth daily with breakfast. 90 tablet 0   lidocaine-prilocaine (EMLA) cream Apply to affected area once (Patient taking differently: Apply 1 application topically as directed.) 30 g 3   magic mouthwash w/lidocaine SOLN Take 10 mLs by mouth 4 (four) times daily as needed for  mouth pain. 10 mls swish and spit by mouth four times daily as needed 470 mL 2   montelukast (SINGULAIR) 10 MG tablet Take 10 mg by mouth daily.     ondansetron (ZOFRAN) 8 MG tablet Take 1 tablet (8 mg total) by mouth every 8 (eight) hours as needed for nausea or vomiting. Starting on day 3 after chemotherapy 30 tablet 2   oxyCODONE (OXY IR/ROXICODONE) 5 MG immediate release tablet Take 1-2 tablets (5-10 mg total) by mouth every 6 (six) hours as needed for severe pain. 90 tablet 0   PRESCRIPTION MEDICATION Inject into the vein See admin instructions. FOLFIRINOX- Every 14 days for 8 treatments     prochlorperazine (COMPAZINE) 10 MG tablet Take 1 tablet (10 mg total) by mouth every 6 (six) hours as needed for vomiting or nausea. 60 tablet  2   promethazine (PHENERGAN) 25 MG tablet Take 25 mg by mouth every 6 (six) hours as needed for vomiting or nausea.     Pyridoxine HCl (VITAMIN B6 PO) Take by mouth.     No current facility-administered medications for this visit.    PHYSICAL EXAMINATION: ECOG PERFORMANCE STATUS: 1 - Symptomatic but completely ambulatory  Vitals:   06/08/21 1050  BP: 132/81  Pulse: 73  Resp: 18  Temp: 98.2 F (36.8 C)  SpO2: 100%   Wt Readings from Last 3 Encounters:  06/08/21 204 lb (92.5 kg)  05/25/21 203 lb 6.4 oz (92.3 kg)  05/22/21 207 lb (93.9 kg)     GENERAL:alert, no distress and comfortable SKIN: skin color normal, no rashes or significant lesions EYES: normal, Conjunctiva are pink and non-injected, sclera clear  NEURO: alert & oriented x 3 with fluent speech  LABORATORY DATA:  I have reviewed the data as listed CBC Latest Ref Rng & Units 06/08/2021 05/25/2021 05/11/2021  WBC 4.0 - 10.5 K/uL 7.7 4.7 5.7  Hemoglobin 12.0 - 15.0 g/dL 12.4 11.3(L) 11.9(L)  Hematocrit 36.0 - 46.0 % 39.5 35.1(L) 37.2  Platelets 150 - 400 K/uL 196 151 163     CMP Latest Ref Rng & Units 05/25/2021 05/11/2021 04/27/2021  Glucose 70 - 99 mg/dL 107(H) 89 101(H)  BUN 6 - 20  mg/dL _0 Creatinine 0.44 - 1.00 mg/dL 1.05(H) 0.91 1.00  Sodium 135 - 145 mmol/L 144 141 145  Potassium 3.5 - 5.1 mmol/L 4.2 4.1 4.0  Chloride 98 - 111 mmol/L 111 109 111  CO2 22 - 32 mmol/L _1 Calcium 8.9 - 10.3 mg/dL 9.3 9.8 9.9  Total Protein 6.5 - 8.1 g/dL 6.8 7.2 6.6  Total Bilirubin 0.3 - 1.2 mg/dL 0.5 0.6 0.4  Alkaline Phos 38 - 126 U/L 58 65 55  AST 15 - 41 U/L 31 46(H) 26  ALT 0 - 44 U/L 35 67(H) 26      RADIOGRAPHIC STUDIES: I have personally reviewed the radiological images as listed and agreed with the findings in the report. No results found.    No orders of the defined types were placed in this encounter.  All questions were answered. The patient knows to call the clinic with any problems, questions or concerns. No barriers to learning was detected. The total time spent in the appointment was 30 minutes.     Truitt Merle, MD 06/08/2021   I, Wilburn Mylar, am acting as scribe for Truitt Merle, MD.   I have reviewed the above documentation for accuracy and completeness, and I agree with the above.

## 2021-06-08 NOTE — Patient Instructions (Signed)
[facility] ONCOLOGY  Discharge Instructions: Thank you for choosing [facility] to provide your oncology and hematology care.   If you have a lab appointment with [facility], please go directly to the [facility] and check in at the registration area.   Wear comfortable clothing and clothing appropriate for easy access to any Portacath or PICC line.   We strive to give you quality time with your provider. You may need to reschedule your appointment if you arrive late (15 or more minutes).  Arriving late affects you and other patients whose appointments are after yours.  Also, if you miss three or more appointments without notifying the office, you may be dismissed from the clinic at the provider's discretion.      For prescription refill requests, have your pharmacy contact our office and allow 72 hours for refills to be completed.    Today you received the following chemotherapy and/or immunotherapy agents Oxaliplatin, leucovorin, Irinotecan, 5 FU.      To help prevent nausea and vomiting after your treatment, we encourage you to take your nausea medication as directed.  BELOW ARE SYMPTOMS THAT SHOULD BE REPORTED IMMEDIATELY: *FEVER GREATER THAN 100.4 F (38 C) OR HIGHER *CHILLS OR SWEATING *NAUSEA AND VOMITING THAT IS NOT CONTROLLED WITH YOUR NAUSEA MEDICATION *UNUSUAL SHORTNESS OF BREATH *UNUSUAL BRUISING OR BLEEDING *URINARY PROBLEMS (pain or burning when urinating, or frequent urination) *BOWEL PROBLEMS (unusual diarrhea, constipation, pain near the anus) TENDERNESS IN MOUTH AND THROAT WITH OR WITHOUT PRESENCE OF ULCERS (sore throat, sores in mouth, or a toothache) UNUSUAL RASH, SWELLING OR PAIN  UNUSUAL VAGINAL DISCHARGE OR ITCHING   Items with * indicate a potential emergency and should be followed up as soon as possible or go to the Emergency Department if any problems should occur.  Please show the CHEMOTHERAPY ALERT CARD or  IMMUNOTHERAPY ALERT CARD at check-in to the Emergency Department and triage nurse.  Should you have questions after your visit or need to cancel or reschedule your appointment, please contact [facility]  Dept: [phone]  and follow the prompts.  Office hours are 8:00 a.m. to 4:30 p.m. Monday - Friday. Please note that voicemails left after 4:00 p.m. may not be returned until the following business day.  We are closed weekends and major holidays. You have access to a nurse at all times for urgent questions. Please call the main number to the clinic Dept: [phone] and follow the prompts.   For any non-urgent questions, you may also contact your provider using MyChart. We now offer e-Visits for anyone 56 and older to request care online for non-urgent symptoms. For details visit [email or web address].   Also download the MyChart app! Go to the app store, search "MyChart", open the app, select [facility], and log in with your MyChart username and password.  Due to Covid, a mask is required upon entering the hospital/clinic. If you do not have a mask, one will be given to you upon arrival. For doctor visits, patients may have 1 support person aged 56 or older with them. For treatment visits, patients cannot have anyone with them due to current Covid guidelines and our immunocompromised population.

## 2021-06-08 NOTE — Progress Notes (Signed)
Per [name], ok to increase 5 FU pump to infuse over 44 hours at a rate of 5.68 ml/ hr.

## 2021-06-08 NOTE — Addendum Note (Signed)
Addended by: Truitt Merle on: 06/08/2021 10:18 PM   Modules accepted: Orders

## 2021-06-09 ENCOUNTER — Encounter: Payer: Self-pay | Admitting: Hematology

## 2021-06-09 ENCOUNTER — Telehealth: Payer: Self-pay | Admitting: Hematology

## 2021-06-09 ENCOUNTER — Encounter: Payer: Self-pay | Admitting: Genetic Counselor

## 2021-06-09 DIAGNOSIS — Z803 Family history of malignant neoplasm of breast: Secondary | ICD-10-CM | POA: Insufficient documentation

## 2021-06-09 LAB — GENETIC SCREENING ORDER

## 2021-06-09 LAB — CANCER ANTIGEN 19-9: CA 19-9: 29 U/mL (ref 0–35)

## 2021-06-09 NOTE — Progress Notes (Signed)
REFERRING PROVIDER: Truitt Merle, [place],  [place] [address]   PRIMARY PROVIDER:  [name], MD  PRIMARY REASON FOR VISIT:  1. Malignant neoplasm of head of pancreas ([place])   2. Family history of breast cancer      HISTORY OF PRESENT ILLNESS:   Robin [name], a 54 y.o. female, was seen for a [place] cancer genetics consultation at the request of Dr.  Burr Medico due to a personal and family history of cancer.  Robin [name] presents to clinic today to discuss the possibility of a hereditary predisposition to cancer, to discuss genetic testing, and to further clarify her future cancer risks, as well as potential cancer risks for family members.   In June 2022, at the age of 38, Robin [name] was diagnosed with adenocarcinoma of the head of the pancreas.   CANCER HISTORY:  Oncology History Overview Note  Cancer Staging Pancreatic cancer [facility]) Staging form: Exocrine Pancreas, AJCC 8th Edition - Clinical stage from 02/09/2021: Stage IV (cT2, cN0, cM1) - Signed by Truitt Merle, MD on 03/16/2021 Total positive nodes: 0    Pancreatic cancer ([place])  01/11/2021 Imaging   CT Abdomen/Pelvis  IMPRESSION: Multiple low-attenuation liver lesions, which cannot be characterized on this unenhanced exam. Abdomen MRI without and with contrast is recommended for further characterization.   Diffuse biliary ductal dilatation, with soft tissue prominence in the region of the pancreatic head. Pancreatic mass cannot be excluded on this unenhanced exam. Recommend abdomen MRI and MRCP without and with contrast for further evaluation.   Colonic diverticulosis, without radiographic evidence of diverticulitis.     01/12/2021 Imaging   MRI ABDOMEN WITHOUT AND WITH CONTRAST (INCLUDING MRCP)  IMPRESSION: Biliary duct distension of both intra and extrahepatic biliary tree with abrupt caliber change but with no visible mass on imaging. Caliber change more abrupt than expected for stricture and  the biliary tree was top-normal size at 6 mm in 2017. Endoscopic assessment is suggested to exclude an occult biliary lesion or atypical stricture.   Lobulated T2 bright lesion in the medial segment of the LEFT hepatic lobe that favors a sclerosed hemangioma. There is some internal septation suggested and the filling pattern is slightly atypical. Consider 3 to six-month follow-up with extended delays following contrast to 7 minutes better assess filling characteristics, differential consideration would include a biliary cystadenoma with septal enhancement.   01/13/2021 Procedure   ERCP under the care of Dr. Carlean Purl  IMPRESSION: 1. Dilation of the common bile duct and main pancreatic duct. 2. Sphincterotomy and placement of a plastic biliary stent.    01/13/2021 Pathology Results   CASE: [id number]   A. COMMON BILE DUCT, STRICTURE, #1, BRUSHING:   FINAL MICROSCOPIC DIAGNOSIS:  - Suspicious for malignancy  - See comment    02/08/2021 Imaging   CT CHEST WITHOUT CONTRAST AND CT OF ABDOMEN AND PELVIS WITH CONTRAST  IMPRESSION: 1. Findings are highly suspicious for a hypovascular mass in the posterior aspect of the head of the pancreas, as detailed above. This appears associated with persistent narrowing of the distal common bile duct and abrupt termination of the pancreatic duct. Common bile duct stent appears appropriately located, but is associated with some persistent dilatation of the common bile duct. 2. 1.2 x 0.7 cm ground-glass attenuation nodule in the right upper lobe. Initial follow-up with CT at 6-12 months is recommended to confirm persistence. If persistent, repeat CT is recommended every 2 years until 5 years of stability has been established. This  recommendation follows the consensus statement: Guidelines for Management of Incidental Pulmonary Nodules Detected on CT Images: From the Fleischner Society 2017; Radiology 2017; 284:228-243. 3. Multiple liver lesions,  smallest of which likely represent tiny simple cysts. The larger lesion in the left lobe of the liver has imaging characteristics once again most suggestive of a cavernous hemangioma. 4. Aortic atherosclerosis, in addition to left anterior descending coronary artery disease. Please note that although the presence of coronary artery calcium documents the presence of coronary artery disease, the severity of this disease and any potential stenosis cannot be assessed on this non-gated CT examination. Assessment for potential risk factor modification, dietary therapy or pharmacologic therapy may be warranted, if clinically indicated. 5. Additional incidental findings, as above.    02/09/2021 Procedure   ERCP under the care of Dr. Rush Landmark   IMPRESSION: Limited images during ERCP demonstrates removal plastic biliary stent and placement of a new metallic biliary stent. Please refer to the dictated operative report for full details of intraoperative findings and procedure.   02/09/2021 Pathology Results   CASE: [id number]  FINAL MICROSCOPIC DIAGNOSIS:  A. PANCREAS, HEAD OF LESION, FINE NEEDLE ASPIRATION:  - Malignant cells consistent with adenocarcinoma    02/09/2021 Procedure   EGD Impression: - No gross lesions in esophagus. Z-line regular, 40 cm from the incisors. - Severe gastritis noted. No other gross lesions in the stomach. Biopsied. - Plastic biliary stent in the duodenum. Removed. Revealed a patent biliary sphincterotomy was found. - No gross lesions in the duodenal bulb, in the first portion of the duodenum and in the second portion of the duodenum. EUS Impression: - Hyperechoic material consistent with sludge was visualized endosonographically in the gallbladder. - There was a suggestion of a stricture in the lower third of the main bile duct leading to dilation in the middle third of the main bile duct and in the upper third of the main bile duct with evidence of  hyperechoic material consistent with sludge within the common bile duct. - A mass-like region was identified in the pancreatic head where the CBD narrowed and where the upstream PD dilation was noted. This is not the most confluent of masses however. Cytology results are pending. However, the endosonographic appearance is suggestive of potential pancreatic adenocarcinoma. This was staged T2 N0 Mx by endosonographic criteria. The staging applies if malignancy is confirmed. Fine needle biopsy performed. - No malignant-appearing lymph nodes were visualized in the celiac region (level 20), peripancreatic region and porta hepatis region.   02/09/2021 Cancer Staging   Staging form: Exocrine Pancreas, AJCC 8th Edition - Clinical stage from 02/09/2021: Stage IV (cT2, cN0, cM1) - Signed by Truitt Merle, MD on 03/16/2021 Total positive nodes: 0   02/13/2021 Initial Diagnosis   Pancreatic cancer ([place])   02/28/2021 Imaging   PET  Signs of pancreatic head mass with suspected hepatic metastatic lesion in the RIGHT hepatic lobe as described.   Equivocal uptake in adjacent lymph nodes, potentially reactive in the setting of biliary stent. Note the biliary stent placement is in the mid to distal common bile duct but appears similar to recent ERCP images.   Stable appearance of ground-glass in the RIGHT upper lobe. This does not display increased FDG uptake and would be atypical for metastatic lesion based on appearance and location. Suggest six-month follow-up for further evaluation.   RIGHT thyroid uptake is slightly asymmetric potentially associated with small nodule. Recommend thyroid ultrasound with biopsy of focal abnormality is seen.(Ref: J Am Coll Radiol. 2015  Feb;12(2): 143-50).   03/02/2021 -  Chemotherapy   Patient is on Treatment Plan : PANCREAS Modified FOLFIRINOX q14d x 4 cycles     04/25/2021 Imaging   CT AP w contrast IMPRESSION: 1. No evidence of pancreatic carcinoma progression by CT  imaging. 2. Hypermetabolic lesions on comparison PET scan within the head of the pancreas and liver were essentially occult by CT imaging. Consider follow-up FDG PET scan at some future point. 3. Multiple small benign-appearing hypodense lesions in the liver and benign hepatic hemangioma unchanged from prior. 4. No evidence of lymphadenopathy      RISK FACTORS:  Menarche was at age 52.  First live birth at age 3.  OCP use for approximately  15  years.  Ovaries intact: yes.  Hysterectomy: yes; in 2000 due to endometriosis HRT use: 0 years. Colonoscopy: yes;  most recent in 2021; less than 10 lifetime colon polyps . Mammogram within the last year: most recent 08/2019. Number of breast biopsies: 0. Up to date with pelvic exams: yes. Any excessive radiation exposure in the past: no  Past Medical History:  Diagnosis Date   Asthma    Bipolar disorder ([place])    Diverticulosis    Gestational hypertension    HA (headache)    History of borderline personality disorder    Hypersomnia, persistent 04/29/2013   IBS (irritable bowel syndrome)    Memory loss, short term    [name] 04-03-13    Migraine    Pancreatic cancer ([place])    Reflux esophagitis    Seasonal allergies     Past Surgical History:  Procedure Laterality Date   ABDOMINAL HYSTERECTOMY  2000   BILIARY BRUSHING  01/13/2021   Procedure: BILIARY BRUSHING;  Surgeon: Gatha Mayer, MD;  Location: [facility];  Service: Endoscopy;;   BILIARY DILATION  02/09/2021   Procedure: BILIARY DILATION;  Surgeon: Irving Copas., MD;  Location: [place];  Service: Gastroenterology;;   BILIARY STENT PLACEMENT N/A 01/13/2021   Procedure: BILIARY STENT PLACEMENT;  Surgeon: Gatha Mayer, MD;  [facility];  Service: Endoscopy;  Laterality: N/A;   BILIARY STENT PLACEMENT  02/09/2021   Procedure: BILIARY STENT PLACEMENT;  Surgeon: Irving Copas., MD;  Location: [place];  Service: Gastroenterology;;    BIOPSY  01/13/2021   Procedure: BIOPSY;  Surgeon: Gatha Mayer, MD;  [facility];  Service: Endoscopy;;   BIOPSY  02/09/2021   Procedure: BIOPSY;  Surgeon: Irving Copas., MD;  [facility];  Service: Gastroenterology;;   COLONOSCOPY  01/2020   ERCP N/A 01/13/2021   Procedure: ENDOSCOPIC RETROGRADE CHOLANGIOPANCREATOGRAPHY (ERCP);  Surgeon: Gatha Mayer, MD;  Location: [facility];  Service: Endoscopy;  Laterality: N/A;   ERCP N/A 02/09/2021   Procedure: ENDOSCOPIC RETROGRADE CHOLANGIOPANCREATOGRAPHY (ERCP);  Surgeon: Irving Copas., MD;  Location: [place];  Service: Gastroenterology;  Laterality: N/A;   ESOPHAGOGASTRODUODENOSCOPY (EGD) WITH PROPOFOL N/A 02/09/2021   Procedure: ESOPHAGOGASTRODUODENOSCOPY (EGD) WITH PROPOFOL;  Surgeon: Rush Landmark Telford Nab., MD;  Location: [place];  Service: Gastroenterology;  Laterality: N/A;   EUS N/A 02/09/2021   Procedure: UPPER ENDOSCOPIC ULTRASOUND (EUS) LINEAR;  Surgeon: Irving Copas., MD;  Location: [place];  Service: Gastroenterology;  Laterality: N/A;   FINE NEEDLE ASPIRATION  02/09/2021   Procedure: FINE NEEDLE ASPIRATION (FNA) LINEAR;  Surgeon: Irving Copas., MD;  Location: [place];  Service: Gastroenterology;;   KNEE SURGERY Right 96,98,00,02,05,2014   NASAL SINUS SURGERY     NOSE SURGERY  [phone]   PORTACATH PLACEMENT Right 02/17/2021   Procedure: INSERTION PORT-A-CATH;  Surgeon: Dwan Bolt, MD;  Location: [facility];  Service: General;  Laterality: Right;  LMA ANESTHESIA   REPLACEMENT TOTAL KNEE Right    SPHINCTEROTOMY  01/13/2021   Procedure: SPHINCTEROTOMY;  Surgeon: Gatha Mayer, MD;  [facility];  Service: Endoscopy;;   STENT REMOVAL  02/09/2021   Procedure: STENT REMOVAL;  Surgeon: Irving Copas., MD;  Location: [place];  Service: Gastroenterology;;   WRIST SURGERY Right [phone]    Social History    Socioeconomic History   Marital status: Married    Spouse name: Scientist, physiological   Number of children: 1   Years of education: AA   Highest education level: Not on file  Occupational History    Employer: [employer]  Tobacco Use   Smoking status: Never   Smokeless tobacco: Never  Vaping Use   Vaping Use: Never used  Substance and Sexual Activity   Alcohol use: No   Drug use: No   Sexual activity: Not on file  Other Topics Concern   Not on file  Social History Narrative   Pneumonia vaccine 2005   Patient lives at home with wife and 2 sons.    Patient works in [place].    Patient has her AA   Right handed   One story home      Social Determinants of Health   Financial Resource Strain: Not on file  Food Insecurity: Not on file  Transportation Needs: Not on file  Physical Activity: Not on file  Stress: Not on file  Social Connections: Not on file     FAMILY HISTORY:  We obtained a detailed, 4-generation family history.  Significant diagnoses are listed below: Family History  Problem Relation Age of Onset   Cancer Father        ? prostate or colon; dx after 69   Breast cancer Maternal Grandmother        dx after 28   Breast cancer Half-Sister 33   Breast cancer Half-Sister        paternal half sister; dx late 60s-early 60s   Breast cancer Maternal Great-grandmother        MGM's mother; dx after 44    Robin [name] is unaware of previous family history of genetic testing for hereditary cancer risks. There is no reported Ashkenazi Jewish ancestry. There is no known consanguinity.  GENETIC COUNSELING ASSESSMENT: Robin [name] is a 55 y.o. female with a personal and family history of cancer which is somewhat suggestive of a hereditary cancer syndrome and predisposition to cancer given her diagnosis of pancreatic cancer and the presence of related cancers in the family. We, therefore, discussed and recommended the following at today's  visit.   DISCUSSION: We discussed that 5 - 10% of cancer is hereditary, with most cases of hereditary pancreatic cancer associated with mutations in BRCA1/2.  There are other genes that can be associated with hereditary pancreatic or breast cancer syndromes.  We discussed that testing is beneficial for several reasons including knowing how to follow individuals after completing their treatment, identifying whether potential treatment options, such as PARP inhibitors, would be beneficial, and understanding if other family members could be at risk for cancer and allowing them to undergo genetic testing.   We reviewed the characteristics, features and inheritance patterns of hereditary cancer syndromes. We also discussed genetic testing, including the appropriate family members to test,  the process of testing, insurance coverage and turn-around-time for results. We discussed the implications of a negative, positive, carrier and/or variant of uncertain significant result. We recommended Robin [name] pursue genetic testing for a panel that includes genes associated with pancreatic and breast cancer.   The CustomNext-Cancer +RNAinsight Panel offered by [facility] and includes sequencing and rearrangement analysis for the following 91 genes: AIP, ALK, APC, ATM, AXIN2, BAP1, BARD1, BLM, BMPR1A, BRCA1, BRCA2, BRIP1, CDC73, CDH1, CDK4, CDKN1B, CDKN2A, CHEK2, CTNNA1, DICER1, FANCC, FH, FLCN, GALNT12, KIF1B, LZTR1, MAX, MEN1, MET, MLH1, MRE11A, MSH2, MSH3, MSH6, MUTYH, NBN, NF1, NF2, NTHL1, PALB2, PHOX2B, PMS2, POT1, PRKAR1A, PTCH1, PTEN, RAD50, RAD51C, RAD51D, RB1, RECQL, RET, SDHA, SDHAF2, SDHB, SDHC, SDHD, SMAD4, SMARCA4, SMARCB1, SMARCE1, STK11, SUFU, TMEM127, TP53, TSC1, TSC2, VHL and XRCC2 (sequencing and deletion/duplication); CASR, CFTR, CPA1, CTRC, EGFR, EGLN1, FAM175A, HOXB13, KIT, MITF, MLH3, PALLD, PDGFRA, POLD1, POLE, PRSS1, RINT1, RPS20, SPINK1 and TERT (sequencing only); EPCAM and GREM1  (deletion/duplication only). RNA data is routinely analyzed for use in variant interpretation for all genes.   Based on Robin [name]'s personal history of cancer, she meets medical criteria for genetic testing. Despite that she meets criteria, she may still have an out of pocket cost. We discussed that if her out of pocket cost for testing is over $100, the laboratory should contact her to discuss self-pay options and patient pay assistance programs.   PLAN: After considering the risks, benefits, and limitations, Robin [name] provided informed consent to pursue genetic testing and the blood sample was sent to [facility] for analysis of the CustomNext-Expanded +RNAinsight Panel. Results should be available within approximately 3 weeks' time, at which point they will be disclosed by telephone to Robin [name], as will any additional recommendations warranted by these results. Robin [name] will receive a summary of her genetic counseling visit and a copy of her results once available. This information will also be available in Epic.   Lastly, we encouraged Robin [name] to remain in contact with cancer genetics annually so that we can continuously update the family history and inform her of any changes in cancer genetics and testing that may be of benefit for this family.   Robin [name] questions were answered to her satisfaction today. Our contact information was provided should additional questions or concerns arise. Thank you for the referral and allowing [place] to share in the care of your patient.   Robin [name] M. Joette Catching, [place], [facility] Genetic Counselor [email or web address] (P) [phone]  The patient was seen for a total of 30 minutes in face-to-face genetic counseling.  The patient was seen alone.  Drs. Magrinat, Lindi Adie and/or Burr Medico were available to discuss this case as needed.    _______________________________________________________________________ For Office Staff:  Number of people involved in  session: 1 Was an Intern/ student involved with case: no

## 2021-06-09 NOTE — Telephone Encounter (Signed)
Left message with follow-up appointments per 10/13 los. 

## 2021-06-10 ENCOUNTER — Inpatient Hospital Stay: Payer: 59

## 2021-06-10 ENCOUNTER — Other Ambulatory Visit: Payer: Self-pay

## 2021-06-10 VITALS — BP 113/70 | HR 78 | Temp 98.1°F | Resp 20

## 2021-06-10 DIAGNOSIS — Z5111 Encounter for antineoplastic chemotherapy: Secondary | ICD-10-CM | POA: Diagnosis not present

## 2021-06-10 DIAGNOSIS — C250 Malignant neoplasm of head of pancreas: Secondary | ICD-10-CM

## 2021-06-10 MED ORDER — HEPARIN SOD (PORK) LOCK FLUSH 100 UNIT/ML IV SOLN
500.0000 [IU] | Freq: Once | INTRAVENOUS | Status: AC | PRN
  Administered 2021-06-10: 500 [IU]

## 2021-06-10 MED ORDER — SODIUM CHLORIDE 0.9% FLUSH
10.0000 mL | INTRAVENOUS | Status: DC | PRN
Start: 2021-06-10 — End: 2021-06-10
  Administered 2021-06-10: 10 mL

## 2021-06-10 NOTE — Patient Instructions (Signed)
Implanted Port Home Guide An implanted port is a device that is placed under the skin. It is usually placed in the chest. The device can be used to give IV medicine, to take blood, or for dialysis. You may have an implanted port if: You need IV medicine that would be irritating to the small veins in your hands or arms. You need IV medicines, such as antibiotics, for a long period of time. You need IV nutrition for a long period of time. You need dialysis. When you have a port, your health care provider can choose to use the port instead of veins in your arms for these procedures. You may have fewer limitations when using a port than you would if you used other types of long-term IVs, and you will likely be able to return to normal activities after your incision heals. An implanted port has two main parts: Reservoir. The reservoir is the part where a needle is inserted to give medicines or draw blood. The reservoir is round. After it is placed, it appears as a small, raised area under your skin. Catheter. The catheter is a thin, flexible tube that connects the reservoir to a vein. Medicine that is inserted into the reservoir goes into the catheter and then into the vein. How is my port accessed? To access your port: A numbing cream may be placed on the skin over the port site. Your health care provider will put on a mask and sterile gloves. The skin over your port will be cleaned carefully with a germ-killing soap and allowed to dry. Your health care provider will gently pinch the port and insert a needle into it. Your health care provider will check for a blood return to make sure the port is in the vein and is not clogged. If your port needs to remain accessed to get medicine continuously (constant infusion), your health care provider will place a clear bandage (dressing) over the needle site. The dressing and needle will need to be changed every week, or as told by your health care provider. What  is flushing? Flushing helps keep the port from getting clogged. Follow instructions from your health care provider about how and when to flush the port. Ports are usually flushed with saline solution or a medicine called heparin. The need for flushing will depend on how the port is used: If the port is only used from time to time to give medicines or draw blood, the port may need to be flushed: Before and after medicines have been given. Before and after blood has been drawn. As part of routine maintenance. Flushing may be recommended every 4-6 weeks. If a constant infusion is running, the port may not need to be flushed. Throw away any syringes in a disposal container that is meant for sharp items (sharps container). You can buy a sharps container from a pharmacy, or you can make one by using an empty hard plastic bottle with a cover. How long will my port stay implanted? The port can stay in for as long as your health care provider thinks it is needed. When it is time for the port to come out, a surgery will be done to remove it. The surgery will be similar to the procedure that was done to put the port in. Follow these instructions at home:  Flush your port as told by your health care provider. If you need an infusion over several days, follow instructions from your health care provider about how   to take care of your port site. Make sure you: Wash your hands with soap and water before you change your dressing. If soap and water are not available, use alcohol-based hand sanitizer. Change your dressing as told by your health care provider. Place any used dressings or infusion bags into a plastic bag. Throw that bag in the trash. Keep the dressing that covers the needle clean and dry. Do not get it wet. Do not use scissors or sharp objects near the tube. Keep the tube clamped, unless it is being used. Check your port site every day for signs of infection. Check for: Redness, swelling, or  pain. Fluid or blood. Pus or a bad smell. Protect the skin around the port site. Avoid wearing bra straps that rub or irritate the site. Protect the skin around your port from seat belts. Place a soft pad over your chest if needed. Bathe or shower as told by your health care provider. The site may get wet as long as you are not actively receiving an infusion. Return to your normal activities as told by your health care provider. Ask your health care provider what activities are safe for you. Carry a medical alert card or wear a medical alert bracelet at all times. This will let health care providers know that you have an implanted port in case of an emergency. Get help right away if: You have redness, swelling, or pain at the port site. You have fluid or blood coming from your port site. You have pus or a bad smell coming from the port site. You have a fever. Summary Implanted ports are usually placed in the chest for long-term IV access. Follow instructions from your health care provider about flushing the port and changing bandages (dressings). Take care of the area around your port by avoiding clothing that puts pressure on the area, and by watching for signs of infection. Protect the skin around your port from seat belts. Place a soft pad over your chest if needed. Get help right away if you have a fever or you have redness, swelling, pain, drainage, or a bad smell at the port site. This information is not intended to replace advice given to you by your health care provider. Make sure you discuss any questions you have with your health care provider. Document Revised: 11/02/2020 Document Reviewed: 12/28/2019 Elsevier Patient Education  2022 Elsevier Inc.  

## 2021-06-12 ENCOUNTER — Telehealth: Payer: Self-pay | Admitting: Genetic Counselor

## 2021-06-12 ENCOUNTER — Other Ambulatory Visit: Payer: Self-pay | Admitting: Genetic Counselor

## 2021-06-12 DIAGNOSIS — C250 Malignant neoplasm of head of pancreas: Secondary | ICD-10-CM

## 2021-06-12 NOTE — Telephone Encounter (Signed)
Called Ms. Uzelac and LVM to discuss that Cephus Shelling unable to run genetic testing due to sample issue (tubes not labelled).  Will draw genetics again the next time she is at the [facility] on 10/27.  Contact information provided.

## 2021-06-15 ENCOUNTER — Encounter: Payer: Self-pay | Admitting: Hematology

## 2021-06-17 ENCOUNTER — Inpatient Hospital Stay: Payer: 59

## 2021-06-17 ENCOUNTER — Other Ambulatory Visit: Payer: Self-pay | Admitting: Hematology

## 2021-06-17 ENCOUNTER — Telehealth: Payer: Self-pay | Admitting: Hematology

## 2021-06-17 ENCOUNTER — Ambulatory Visit: Payer: Self-pay

## 2021-06-17 VITALS — BP 145/79 | HR 67 | Temp 98.9°F | Resp 20

## 2021-06-17 DIAGNOSIS — Z5111 Encounter for antineoplastic chemotherapy: Secondary | ICD-10-CM | POA: Diagnosis not present

## 2021-06-17 DIAGNOSIS — Z803 Family history of malignant neoplasm of breast: Secondary | ICD-10-CM

## 2021-06-17 DIAGNOSIS — C250 Malignant neoplasm of head of pancreas: Secondary | ICD-10-CM

## 2021-06-17 MED ORDER — HEPARIN SOD (PORK) LOCK FLUSH 100 UNIT/ML IV SOLN
500.0000 [IU] | Freq: Once | INTRAVENOUS | Status: AC
  Administered 2021-06-17: 500 [IU] via INTRAVENOUS

## 2021-06-17 MED ORDER — MORPHINE SULFATE (PF) 4 MG/ML IV SOLN
6.0000 mg | Freq: Once | INTRAVENOUS | Status: AC
  Administered 2021-06-17: 6 mg via INTRAVENOUS

## 2021-06-17 MED ORDER — OXYCODONE HCL 10 MG PO TABS: 10.0000 mg | ORAL_TABLET | Freq: Four times a day (QID) | ORAL | 0 refills | Status: DC | PRN

## 2021-06-17 MED ORDER — MORPHINE SULFATE ER 15 MG PO TBCR
15.0000 mg | EXTENDED_RELEASE_TABLET | Freq: Two times a day (BID) | ORAL | 0 refills | Status: DC
Start: 2021-06-17 — End: 2021-07-10

## 2021-06-17 MED ORDER — SODIUM CHLORIDE 0.9% FLUSH
10.0000 mL | Freq: Once | INTRAVENOUS | Status: AC
  Administered 2021-06-17: 10 mL via INTRAVENOUS

## 2021-06-17 MED ORDER — PROCHLORPERAZINE MALEATE 10 MG PO TABS
10.0000 mg | ORAL_TABLET | Freq: Once | ORAL | Status: AC
  Administered 2021-06-17: 10 mg via ORAL

## 2021-06-17 MED ORDER — SODIUM CHLORIDE 0.9 % IV SOLN: INTRAVENOUS | Status: DC

## 2021-06-17 NOTE — Progress Notes (Signed)
Patient arrived per mychart message from [name] to come in today prior to 1pm for pain medication. No appt, no orders. Contacted [name], assessed patient, [name] spoke with patient over the phone. Appt made for patient. Received verbal order for nausea and pain medication

## 2021-06-17 NOTE — Telephone Encounter (Signed)
Robin [name] showed up in our clinic this morning. She took last pill of oxycodone yesterday at 2pm. She is in quite bit pain, in epigastric area and radiates to left back. Mild nausea, no vomiting, no fever or chills. She is able to eat and drink adequately. VS stable today. I spoke with her and her nurse Seth Bake today. I will give her iv morphine 6mg  and compazine 10mg , and call in prescriptions of MS contin 15mg  q12h and oxycodone 10mg  prn. She voiced good understanding and agrees with the plan.   Truitt Merle  06/17/2021  11:14 AM

## 2021-06-21 ENCOUNTER — Telehealth: Payer: Self-pay

## 2021-06-21 NOTE — Telephone Encounter (Signed)
Telephone encounter note

## 2021-06-22 ENCOUNTER — Encounter: Payer: Self-pay | Admitting: Hematology

## 2021-06-22 ENCOUNTER — Inpatient Hospital Stay: Payer: 59

## 2021-06-22 ENCOUNTER — Inpatient Hospital Stay (HOSPITAL_BASED_OUTPATIENT_CLINIC_OR_DEPARTMENT_OTHER): Payer: 59 | Admitting: Hematology

## 2021-06-22 ENCOUNTER — Other Ambulatory Visit: Payer: Self-pay

## 2021-06-22 VITALS — BP 115/89 | HR 77 | Temp 98.5°F | Resp 17 | Ht 69.0 in | Wt 200.7 lb

## 2021-06-22 DIAGNOSIS — C250 Malignant neoplasm of head of pancreas: Secondary | ICD-10-CM

## 2021-06-22 DIAGNOSIS — Z5111 Encounter for antineoplastic chemotherapy: Secondary | ICD-10-CM | POA: Diagnosis not present

## 2021-06-22 DIAGNOSIS — Z95828 Presence of other vascular implants and grafts: Secondary | ICD-10-CM

## 2021-06-22 LAB — CMP (CANCER CENTER ONLY)
ALT: 22 U/L (ref 0–44)
AST: 25 U/L (ref 15–41)
Albumin: 3.7 g/dL (ref 3.5–5.0)
Alkaline Phosphatase: 62 U/L (ref 38–126)
Anion gap: 10 (ref 5–15)
BUN: 15 mg/dL (ref 6–20)
CO2: 23 mmol/L (ref 22–32)
Calcium: 9.3 mg/dL (ref 8.9–10.3)
Chloride: 108 mmol/L (ref 98–111)
Creatinine: 0.95 mg/dL (ref 0.44–1.00)
GFR, Estimated: >60 mL/min (ref >60)
Glucose, Bld: 92 mg/dL (ref 70–99)
Potassium: 4.2 mmol/L (ref 3.5–5.1)
Sodium: 141 mmol/L (ref 135–145)
Total Bilirubin: 0.6 mg/dL (ref 0.3–1.2)
Total Protein: 7.2 g/dL (ref 6.5–8.1)

## 2021-06-22 LAB — CBC WITH DIFFERENTIAL (CANCER CENTER ONLY)
Abs Immature Granulocytes: 0.01 10*3/uL (ref 0.00–0.07)
Basophils Absolute: 0 10*3/uL (ref 0.0–0.1)
Basophils Relative: 0 %
Eosinophils Absolute: 0 10*3/uL (ref 0.0–0.5)
Eosinophils Relative: 1 %
HCT: 36.9 % (ref 36.0–46.0)
Hemoglobin: 12 g/dL (ref 12.0–15.0)
Immature Granulocytes: 0 %
Lymphocytes Relative: 44 %
Lymphs Abs: 1.9 10*3/uL (ref 0.7–4.0)
MCH: 29.2 pg (ref 26.0–34.0)
MCHC: 32.5 g/dL (ref 30.0–36.0)
MCV: 89.8 fL (ref 80.0–100.0)
Monocytes Absolute: 0.5 10*3/uL (ref 0.1–1.0)
Monocytes Relative: 12 %
Neutro Abs: 1.8 10*3/uL (ref 1.7–7.7)
Neutrophils Relative %: 43 %
Platelet Count: 174 10*3/uL (ref 150–400)
RBC: 4.11 MIL/uL (ref 3.87–5.11)
RDW: 15 % (ref 11.5–15.5)
WBC Count: 4.2 10*3/uL (ref 4.0–10.5)
nRBC: 0 % (ref 0.0–0.2)

## 2021-06-22 LAB — GENETIC SCREENING ORDER

## 2021-06-22 MED ORDER — SODIUM CHLORIDE 0.9 % IV SOLN
10.0000 mg | Freq: Once | INTRAVENOUS | Status: AC
  Administered 2021-06-22: 10 mg via INTRAVENOUS
  Filled 2021-06-22: qty 10

## 2021-06-22 MED ORDER — DEXTROSE 5 % IV SOLN: Freq: Once | INTRAVENOUS | Status: AC

## 2021-06-22 MED ORDER — SODIUM CHLORIDE 0.9 % IV SOLN
400.0000 mg/m2 | Freq: Once | INTRAVENOUS | Status: AC
  Administered 2021-06-22: 856 mg via INTRAVENOUS
  Filled 2021-06-22: qty 42.8

## 2021-06-22 MED ORDER — HEPARIN SOD (PORK) LOCK FLUSH 100 UNIT/ML IV SOLN: 500.0000 [IU] | Freq: Once | INTRAVENOUS | Status: DC | PRN

## 2021-06-22 MED ORDER — FLUOROURACIL CHEMO INJECTION 5 GM/100ML
2325.0000 mg/m2 | INTRAVENOUS | Status: DC
  Administered 2021-06-22: 5000 mg via INTRAVENOUS
  Filled 2021-06-22: qty 100

## 2021-06-22 MED ORDER — MORPHINE SULFATE (PF) 4 MG/ML IV SOLN: 6.0000 mg | Freq: Once | INTRAVENOUS | Status: DC

## 2021-06-22 MED ORDER — PALONOSETRON HCL INJECTION 0.25 MG/5ML
0.2500 mg | Freq: Once | INTRAVENOUS | Status: AC
  Administered 2021-06-22: 0.25 mg via INTRAVENOUS
  Filled 2021-06-22: qty 5

## 2021-06-22 MED ORDER — MORPHINE SULFATE (PF) 4 MG/ML IV SOLN
6.0000 mg | Freq: Once | INTRAVENOUS | Status: AC
  Administered 2021-06-22: 6 mg via INTRAVENOUS
  Filled 2021-06-22: qty 2

## 2021-06-22 MED ORDER — SODIUM CHLORIDE 0.9 % IV SOLN
130.0000 mg/m2 | Freq: Once | INTRAVENOUS | Status: AC
  Administered 2021-06-22: 280 mg via INTRAVENOUS
  Filled 2021-06-22: qty 14

## 2021-06-22 MED ORDER — SODIUM CHLORIDE 0.9% FLUSH: 10.0000 mL | INTRAVENOUS | Status: DC | PRN

## 2021-06-22 MED ORDER — OXALIPLATIN CHEMO INJECTION 100 MG/20ML
50.0000 mg/m2 | Freq: Once | INTRAVENOUS | Status: AC
  Administered 2021-06-22: 105 mg via INTRAVENOUS
  Filled 2021-06-22: qty 20

## 2021-06-22 MED ORDER — SODIUM CHLORIDE 0.9 % IV SOLN
150.0000 mg | Freq: Once | INTRAVENOUS | Status: AC
  Administered 2021-06-22: 150 mg via INTRAVENOUS
  Filled 2021-06-22: qty 150

## 2021-06-22 MED ORDER — ATROPINE SULFATE 1 MG/ML IV SOLN
0.5000 mg | Freq: Once | INTRAVENOUS | Status: AC | PRN
  Administered 2021-06-22: 0.5 mg via INTRAVENOUS
  Filled 2021-06-22: qty 1

## 2021-06-22 MED ORDER — SODIUM CHLORIDE 0.9% FLUSH
10.0000 mL | Freq: Once | INTRAVENOUS | Status: AC
  Administered 2021-06-22: 10 mL

## 2021-06-22 NOTE — Patient Instructions (Signed)
[facility] ONCOLOGY  Discharge Instructions: Thank you for choosing [facility] to provide your oncology and hematology care.   If you have a lab appointment with [facility], please go directly to the [facility] and check in at the registration area.   Wear comfortable clothing and clothing appropriate for easy access to any Portacath or PICC line.   We strive to give you quality time with your provider. You may need to reschedule your appointment if you arrive late (15 or more minutes).  Arriving late affects you and other patients whose appointments are after yours.  Also, if you miss three or more appointments without notifying the office, you may be dismissed from the clinic at the provider's discretion.      For prescription refill requests, have your pharmacy contact our office and allow 72 hours for refills to be completed.    Today you received the following chemotherapy and/or immunotherapy agents Oxaliplatin, leucovorin, Irinotecan, 5 FU.      To help prevent nausea and vomiting after your treatment, we encourage you to take your nausea medication as directed.  BELOW ARE SYMPTOMS THAT SHOULD BE REPORTED IMMEDIATELY: *FEVER GREATER THAN 100.4 F (38 C) OR HIGHER *CHILLS OR SWEATING *NAUSEA AND VOMITING THAT IS NOT CONTROLLED WITH YOUR NAUSEA MEDICATION *UNUSUAL SHORTNESS OF BREATH *UNUSUAL BRUISING OR BLEEDING *URINARY PROBLEMS (pain or burning when urinating, or frequent urination) *BOWEL PROBLEMS (unusual diarrhea, constipation, pain near the anus) TENDERNESS IN MOUTH AND THROAT WITH OR WITHOUT PRESENCE OF ULCERS (sore throat, sores in mouth, or a toothache) UNUSUAL RASH, SWELLING OR PAIN  UNUSUAL VAGINAL DISCHARGE OR ITCHING   Items with * indicate a potential emergency and should be followed up as soon as possible or go to the Emergency Department if any problems should occur.  Please show the CHEMOTHERAPY ALERT CARD or  IMMUNOTHERAPY ALERT CARD at check-in to the Emergency Department and triage nurse.  Should you have questions after your visit or need to cancel or reschedule your appointment, please contact [facility]  Dept: [phone]  and follow the prompts.  Office hours are 8:00 a.m. to 4:30 p.m. Monday - Friday. Please note that voicemails left after 4:00 p.m. may not be returned until the following business day.  We are closed weekends and major holidays. You have access to a nurse at all times for urgent questions. Please call the main number to the clinic Dept: [phone] and follow the prompts.   For any non-urgent questions, you may also contact your provider using MyChart. We now offer e-Visits for anyone 56 and older to request care online for non-urgent symptoms. For details visit [email or web address].   Also download the MyChart app! Go to the app store, search "MyChart", open the app, select [facility], and log in with your MyChart username and password.  Due to Covid, a mask is required upon entering the hospital/clinic. If you do not have a mask, one will be given to you upon arrival. For doctor visits, patients may have 1 support person aged 56 or older with them. For treatment visits, patients cannot have anyone with them due to current Covid guidelines and our immunocompromised population.

## 2021-06-22 NOTE — Progress Notes (Addendum)
[facility]   Telephone:([phone]) [phone] Fax:(336) [phone]   Clinic Follow up Note   Patient Care Team: [name], MD as PCP - General (Family Medicine) Jerline Pain, MD as PCP - Cardiology (Cardiology) End, Harrell Gave, MD as Consulting Physician (Cardiology) Alda Berthold, DO as Consulting Physician (Neurology) Heilingoetter, Tobe Sos, PA-C as Physician Assistant (Physician Assistant) Truitt Merle, MD as Consulting Physician (Oncology) Dwan Bolt, MD as Consulting Physician (General Surgery)  Date of Service:  06/22/2021  CHIEF COMPLAINT: f/u of pancreatic cancer  CURRENT THERAPY:  First line FOLFIRINOX q2 weeks, starting 03/02/21, OXali held with C3 due to neuropathy. Dose reduced to 70 mg/m2 added back with C4, further reduced to 57m/m2 from cycle 7 due to tolerance issue  ASSESSMENT & PLAN:  Robin MICHAUXis a 55y.o. female with   1. Back pain, Right upper quadrant/epigastric abdominal pain -she has developed back pain/spasms that are intermittent but she rates up to 15/10. -she expressed concern about taking medications together, and we discussed this today. -she has flexeril and gabapentin to use along with her pain medications -she will receive IV morphine again today to help control the pain. -will get CT scan for work up   2. Adenocarcinoma of the pancreas, cT2N0M1 with probable oligo liver met  -Presented with fatigue, abdominal pain, and jaundice 12/2020 s/p biliary stent x2. CT 02/08/21 showed 2.5 cm hypervascular area in pancreatic head adjacent to the common bile duct in direct contact with anterior surface of inferior vena cava, 1.2 cm groundglass right lung nodule, and multiple liver lesions -Baseline CA 19.9 elevated at 72 -EUS showed a masslike region in the pancreatic head where the CBD narrowed with upstream PD dilatation noted.  Endosonographic appearance was suggestive of potential pancreatic adenocarcinoma T2 N0 MX.  Final path  showed malignant cells consistent with adenocarcinoma - PET scan showed a hypermetabolic liver lesion in the right lobe, adjacent to right kidney, suspicious for oligo liver metastasis. No other definitive distant mets. This was not visible on ultrasound, Liver biopsy is not feasible   -She began C1 FOLFIRINOX 03/02/21. Her insurance denied prophylactic G-CSF. S/p cycle 2 she began to develop early neuropathy, oxali held with cycle 3. s/p cycle 3 she developed acute abd pain and nausea, with elevated lipase, concerning for acute pancreatitis. She has was hospitalized for hydration and pain management.  -She recovered well, neuropathy resolved.  Able to resume chemo with cycle 4 FOLFIRINOX and dose reduced oxaliplatin on 04/13/21 -restaging CT 04/25/21 showed the hypermetabolic lesions on comparison PET within the head of pancreas and liver were essentially called by CT, stable benign-appearing liver lesions, no progression of the pancreatic mass or adenopathy. -CA 19-9 improving on chemo.  Overall consistent with a good treatment response. -she has been experiencing increased pain to her back. We will plan for restaging scan to be done ASAP. -in addition to C8, she will receive morphine today. -Pt's wife had many questions regarding her prognosis, radiation, life expectancy etc, I answered to her satisfaction   3. 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 -decreased sensation in hands, more so on right -She started B complex and oxalic was held with cycle 3 -Neuropathy resolved, added Oxali back 70 mg/m2 with cycle 4 -She continues to have neuropathy/cold sensitivity after chemo, which she notes lasts longer with each cycle. I reduced her Oxali dose today (C7, 06/08/21)   4. Family history for cancer  -Maternal great-grandmother, grandmother, and 2 sisters with breast cancer -  The patient has 1 biological daughter -her genetic testing labs were drawn today. Results pending.   5. 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   SUMMARY OF ONCOLOGIC HISTORY: Oncology History Overview Note  Cancer Staging Pancreatic cancer [facility]) Staging form: Exocrine Pancreas, AJCC 8th Edition - Clinical stage from 02/09/2021: Stage IV (cT2, cN0, cM1) - Signed by Truitt Merle, MD on 03/16/2021 Total positive nodes: 0    Pancreatic cancer ([place])  01/11/2021 Imaging   CT Abdomen/Pelvis  IMPRESSION: Multiple low-attenuation liver lesions, which cannot be characterized on this unenhanced exam. Abdomen MRI without and with contrast is recommended for further characterization.   Diffuse biliary ductal dilatation, with soft tissue prominence in the region of the pancreatic head. Pancreatic mass cannot be excluded on this unenhanced exam. Recommend abdomen MRI and MRCP without and with contrast for further evaluation.   Colonic diverticulosis, without radiographic evidence of diverticulitis.     01/12/2021 Imaging   MRI ABDOMEN WITHOUT AND WITH CONTRAST (INCLUDING MRCP)  IMPRESSION: Biliary duct distension of both intra and extrahepatic biliary tree with abrupt caliber change but with no visible mass on imaging. Caliber change more abrupt than expected for stricture and the biliary tree was top-normal size at 6 mm in 2017. Endoscopic assessment is suggested to exclude an occult biliary lesion or atypical stricture.   Lobulated T2 bright lesion in the medial segment of the LEFT hepatic lobe that favors a sclerosed hemangioma. There is some internal septation suggested and the filling pattern  is slightly atypical. Consider 3 to six-month follow-up with extended delays following contrast to 7 minutes better assess filling characteristics, differential consideration would include a biliary cystadenoma with septal enhancement.   01/13/2021 Procedure   ERCP under the care of Dr. Carlean Purl  IMPRESSION: 1. Dilation of the common bile duct and main pancreatic duct. 2. Sphincterotomy and placement of a plastic biliary stent.    01/13/2021 Pathology Results   CASE: [id number]   A. COMMON BILE DUCT, STRICTURE, #1, BRUSHING:   FINAL MICROSCOPIC DIAGNOSIS:  - Suspicious for malignancy  - See comment    02/08/2021 Imaging   CT CHEST WITHOUT CONTRAST AND CT OF ABDOMEN AND PELVIS WITH CONTRAST  IMPRESSION: 1. Findings are highly suspicious for a hypovascular mass in the posterior aspect of the head of the pancreas, as detailed above. This appears associated with persistent narrowing of the distal common bile duct and abrupt termination of the pancreatic duct. Common bile duct stent appears appropriately located, but is associated with some persistent dilatation of the common bile duct. 2. 1.2 x 0.7 cm ground-glass attenuation nodule in the right upper lobe. Initial follow-up with CT at 6-12 months is recommended to confirm persistence. If persistent, repeat CT is recommended every 2 years until 5 years of stability has been established. This recommendation follows the consensus statement: Guidelines for Management of Incidental Pulmonary Nodules Detected on CT Images: From the Fleischner Society 2017; Radiology 2017; 284:228-243. 3. Multiple liver lesions, smallest of which likely represent tiny simple  cysts. The larger lesion in the left lobe of the liver has imaging characteristics once again most suggestive of a cavernous hemangioma. 4. Aortic atherosclerosis, in addition to left anterior descending coronary artery disease. Please note that although the presence of coronary  artery calcium documents the presence of coronary artery disease, the severity of this disease and any potential stenosis cannot be assessed on this non-gated CT examination. Assessment for potential risk factor modification, dietary therapy or pharmacologic therapy may be warranted, if clinically indicated. 5. Additional incidental findings, as above.    02/09/2021 Procedure   ERCP under the care of Dr. Rush Landmark   IMPRESSION: Limited images during ERCP demonstrates removal plastic biliary stent and placement of a new metallic biliary stent. Please refer to the dictated operative report for full details of intraoperative findings and procedure.   02/09/2021 Pathology Results   CASE: [id number]  FINAL MICROSCOPIC DIAGNOSIS:  A. PANCREAS, HEAD OF LESION, FINE NEEDLE ASPIRATION:  - Malignant cells consistent with adenocarcinoma    02/09/2021 Procedure   EGD Impression: - No gross lesions in esophagus. Z-line regular, 40 cm from the incisors. - Severe gastritis noted. No other gross lesions in the stomach. Biopsied. - Plastic biliary stent in the duodenum. Removed. Revealed a patent biliary sphincterotomy was found. - No gross lesions in the duodenal bulb, in the first portion of the duodenum and in the second portion of the duodenum. EUS Impression: - Hyperechoic material consistent with sludge was visualized endosonographically in the gallbladder. - There was a suggestion of a stricture in the lower third of the main bile duct leading to dilation in the middle third of the main bile duct and in the upper third of the main bile duct with evidence of hyperechoic material consistent with sludge within the common bile duct. - A mass-like region was identified in the pancreatic head where the CBD narrowed and where the upstream PD dilation was noted. This is not the most confluent of masses however. Cytology results are pending. However, the endosonographic appearance is suggestive  of potential pancreatic adenocarcinoma. This was staged T2 N0 Mx by endosonographic criteria. The staging applies if malignancy is confirmed. Fine needle biopsy performed. - No malignant-appearing lymph nodes were visualized in the celiac region (level 20), peripancreatic region and porta hepatis region.   02/09/2021 Cancer Staging   Staging form: Exocrine Pancreas, AJCC 8th Edition - Clinical stage from 02/09/2021: Stage IV (cT2, cN0, cM1) - Signed by Truitt Merle, MD on 03/16/2021 Total positive nodes: 0    02/13/2021 Initial Diagnosis   Pancreatic cancer ([place])   02/28/2021 Imaging   PET  Signs of pancreatic head mass with suspected hepatic metastatic lesion in the RIGHT hepatic lobe as described.   Equivocal uptake in adjacent lymph nodes, potentially reactive in the setting of biliary stent. Note the biliary stent placement is in the mid to distal common bile duct but appears similar to recent ERCP images.   Stable appearance of ground-glass in the RIGHT upper lobe. This does not display increased FDG uptake and would be atypical for metastatic lesion based on appearance and location. Suggest six-month follow-up for further evaluation.   RIGHT thyroid uptake is slightly asymmetric potentially associated with small nodule. Recommend thyroid ultrasound with biopsy of focal abnormality is seen.(Ref: J Am Coll Radiol. 2015 Feb;12(2): 143-50).   03/02/2021 -  Chemotherapy   Patient is on Treatment Plan : PANCREAS Modified FOLFIRINOX q14d x 4 cycles     04/25/2021 Imaging   CT AP w  contrast IMPRESSION: 1. No evidence of pancreatic carcinoma progression by CT imaging. 2. Hypermetabolic lesions on comparison PET scan within the head of the pancreas and liver were essentially occult by CT imaging. Consider follow-up FDG PET scan at some future point. 3. Multiple small benign-appearing hypodense lesions in the liver and benign hepatic hemangioma unchanged from prior. 4. No evidence of  lymphadenopathy      INTERVAL HISTORY:  Robin [name] is here for a follow up of pancreatic cancer. She was last seen by me on 06/08/21. She presents to the clinic alone, but her wife is on the phone. She reports she has been having severe pain that lasts for about 20-40 minutes at a time and occurs every few hours. She states this has limited her ability to work. She notes bending worsens it and laying on a heating pad makes it better. She reports she feels she is sleeping okay and doesn't wake up often.   All other systems were reviewed with the patient and are negative.  MEDICAL HISTORY:  Past Medical History:  Diagnosis Date   Asthma    Bipolar disorder ([place])    Diverticulosis    Gestational hypertension    HA (headache)    History of borderline personality disorder    Hypersomnia, persistent 04/29/2013   IBS (irritable bowel syndrome)    Memory loss, short term    [name] 04-03-13    Migraine    Pancreatic cancer ([place])    Reflux esophagitis    Seasonal allergies     SURGICAL HISTORY: Past Surgical History:  Procedure Laterality Date   ABDOMINAL HYSTERECTOMY  2000   BILIARY BRUSHING  01/13/2021   Procedure: BILIARY BRUSHING;  Surgeon: Gatha Mayer, MD;  Location: [facility];  Service: Endoscopy;;   BILIARY DILATION  02/09/2021   Procedure: BILIARY DILATION;  Surgeon: Irving Copas., MD;  Location: [facility];  Service: Gastroenterology;;   BILIARY STENT PLACEMENT N/A 01/13/2021   Procedure: BILIARY STENT PLACEMENT;  Surgeon: Gatha Mayer, MD;  Location: [facility];  Service: Endoscopy;  Laterality: N/A;   BILIARY STENT PLACEMENT  02/09/2021   Procedure: BILIARY STENT PLACEMENT;  Surgeon: Irving Copas., MD;  Location: [facility];  Service: Gastroenterology;;   BIOPSY  01/13/2021   Procedure: BIOPSY;  Surgeon: Gatha Mayer, MD;  Location: [facility];  Service: Endoscopy;;   BIOPSY  02/09/2021   Procedure: BIOPSY;  Surgeon:  Irving Copas., MD;  [facility];  Service: Gastroenterology;;   COLONOSCOPY  01/2020   ERCP N/A 01/13/2021   Procedure: ENDOSCOPIC RETROGRADE CHOLANGIOPANCREATOGRAPHY (ERCP);  Surgeon: Gatha Mayer, MD;  Location: [facility];  Service: Endoscopy;  Laterality: N/A;   ERCP N/A 02/09/2021   Procedure: ENDOSCOPIC RETROGRADE CHOLANGIOPANCREATOGRAPHY (ERCP);  Surgeon: Irving Copas., MD;  Location: [facility];  Service: Gastroenterology;  Laterality: N/A;   ESOPHAGOGASTRODUODENOSCOPY (EGD) WITH PROPOFOL N/A 02/09/2021   Procedure: ESOPHAGOGASTRODUODENOSCOPY (EGD) WITH PROPOFOL;  Surgeon: Rush Landmark Telford Nab., MD;  Location: [facility];  Service: Gastroenterology;  Laterality: N/A;   EUS N/A 02/09/2021   Procedure: UPPER ENDOSCOPIC ULTRASOUND (EUS) LINEAR;  Surgeon: Irving Copas., MD;  Location: [facility];  Service: Gastroenterology;  Laterality: N/A;   FINE NEEDLE ASPIRATION  02/09/2021   Procedure: FINE NEEDLE ASPIRATION (FNA) LINEAR;  Surgeon: Irving Copas., MD;  Location: [facility];  Service: Gastroenterology;;   KNEE SURGERY Right 96,98,00,02,05,2014   NASAL SINUS SURGERY     NOSE SURGERY     [phone]  PORTACATH PLACEMENT Right 02/17/2021   Procedure: INSERTION PORT-A-CATH;  Surgeon: Dwan Bolt, MD;  Location: [facility];  Service: General;  Laterality: Right;  LMA ANESTHESIA   REPLACEMENT TOTAL KNEE Right    SPHINCTEROTOMY  01/13/2021   Procedure: SPHINCTEROTOMY;  Surgeon: Gatha Mayer, MD;  Location: [facility];  Service: Endoscopy;;   STENT REMOVAL  02/09/2021   Procedure: STENT REMOVAL;  Surgeon: Irving Copas., MD;  Location: [place];  Service: Gastroenterology;;   WRIST SURGERY Right [phone]    I have reviewed the social history and family history with the patient and they are unchanged from previous note.  ALLERGIES:  is allergic to aspirin, bee venom, penicillin g, penicillins, bactrim  [sulfamethoxazole-trimethoprim], coconut oil, sulfa antibiotics, and sulfamethoxazole-trimethoprim.  MEDICATIONS:  Current Outpatient Medications  Medication Sig Dispense Refill   acetaminophen (TYLENOL) 325 MG tablet Take 325-650 mg by mouth every 6 (six) hours as needed for mild pain or headache.     albuterol (VENTOLIN HFA) 108 (90 Base) MCG/ACT inhaler Inhale 2 puffs into the lungs every 6 (six) hours as needed for wheezing or shortness of breath.     cyclobenzaprine (FLEXERIL) 5 MG tablet TAKE 1 TABLET(5 MG) BY MOUTH AT BEDTIME AS NEEDED FOR LOWER BACK PAIN OR SEVERE HEADACHE (Patient taking differently: Take 5 mg by mouth at bedtime as needed (for lower back pain or severe headaches).) 30 tablet 0   dicyclomine (BENTYL) 10 MG capsule Take 1 capsule (10 mg total) by mouth 4 (four) times daily -  before meals and at bedtime. 30 capsule 1   diphenoxylate-atropine (LOMOTIL) 2.5-0.025 MG tablet Take 1-2 tabs by mouth 4 times a day as needed for loose stools. 30 tablet 0   EPINEPHrine 0.3 mg/0.3 mL IJ SOAJ injection Inject 0.3 mg into the muscle as needed for anaphylaxis. INJECT 0.3 MLS (0.3 MG TOTAL) INTO THE MUSCLE ONCE (Patient taking differently: Inject 0.3 mg into the muscle once as needed for anaphylaxis.) 1 each 0   fluticasone (FLONASE) 50 MCG/ACT nasal spray Place 2 sprays into both nostrils daily.     gabapentin (NEURONTIN) 300 MG capsule Take 300 mg by mouth 3 (three) times daily as needed (for neuropathic pain).     LATUDA 40 MG TABS tablet Take 1 tablet (40 mg total) by mouth daily with breakfast. 90 tablet 0   lidocaine-prilocaine (EMLA) cream Apply to affected area once (Patient taking differently: Apply 1 application topically as directed.) 30 g 3   magic mouthwash w/lidocaine SOLN Take 10 mLs by mouth 4 (four) times daily as needed for mouth pain. 10 mls swish and spit by mouth four times daily as needed 470 mL 2   montelukast (SINGULAIR) 10 MG tablet Take 10 mg by mouth daily.      morphine (MS CONTIN) 15 MG 12 hr tablet Take 1 tablet (15 mg total) by mouth every 12 (twelve) hours. 60 tablet 0   ondansetron (ZOFRAN) 8 MG tablet Take 1 tablet (8 mg total) by mouth every 8 (eight) hours as needed for nausea or vomiting. Starting on day 3 after chemotherapy 30 tablet 2   Oxycodone HCl 10 MG TABS Take 1 tablet (10 mg total) by mouth every 6 (six) hours as needed. 90 tablet 0   PRESCRIPTION MEDICATION Inject into the vein See admin instructions. FOLFIRINOX- Every 14 days for 8 treatments     prochlorperazine (COMPAZINE) 10 MG tablet Take 1 tablet (10 mg total) by mouth every 6 (six) hours as needed for  vomiting or nausea. 60 tablet 2   promethazine (PHENERGAN) 25 MG tablet Take 25 mg by mouth every 6 (six) hours as needed for vomiting or nausea.     Pyridoxine HCl (VITAMIN B6 PO) Take by mouth.     No current facility-administered medications for this visit.   Facility-Administered Medications Ordered in Other Visits  Medication Dose Route Frequency Provider Last Rate Last Admin   atropine injection 0.5 mg  0.5 mg Intravenous Once PRN Truitt Merle, MD       fluorouracil (ADRUCIL) 5,000 mg in sodium chloride 0.9 % 150 mL chemo infusion  2,325 mg/m2 (Treatment Plan Recorded) Intravenous 1 day or 1 dose Truitt Merle, MD       heparin lock flush 100 unit/mL  500 Units Intracatheter Once PRN Truitt Merle, MD       irinotecan (CAMPTOSAR) 280 mg in sodium chloride 0.9 % 500 mL chemo infusion  130 mg/m2 (Treatment Plan Recorded) Intravenous Once Truitt Merle, MD       leucovorin 856 mg in sodium chloride 0.9 % 250 mL infusion  400 mg/m2 (Treatment Plan Recorded) Intravenous Once Truitt Merle, MD       oxaliplatin (ELOXATIN) 105 mg in dextrose 5 % 500 mL chemo infusion  50 mg/m2 (Treatment Plan Recorded) Intravenous Once Truitt Merle, MD 261 mL/hr at 06/22/21 1243 105 mg at 06/22/21 1243   sodium chloride flush (NS) 0.9 % injection 10 mL  10 mL Intracatheter PRN Truitt Merle, MD        PHYSICAL  EXAMINATION: ECOG PERFORMANCE STATUS: 2 - Symptomatic, <50% confined to bed  Vitals:   06/22/21 1022  BP: 115/89  Pulse: 77  Resp: 17  Temp: 98.5 F (36.9 C)  SpO2: 100%   Wt Readings from Last 3 Encounters:  06/22/21 91 kg  06/08/21 92.5 kg  05/25/21 92.3 kg     GENERAL:alert, no distress and comfortable SKIN: skin color normal, no rashes or significant lesions EYES: normal, Conjunctiva are pink and non-injected, sclera clear  NEURO: alert & oriented x 3 with fluent speech  LABORATORY DATA:  I have reviewed the data as listed CBC Latest Ref Rng & Units 06/22/2021 06/08/2021 05/25/2021  WBC 4.0 - 10.5 K/uL 4.2 7.7 4.7  Hemoglobin 12.0 - 15.0 g/dL 12.0 12.4 11.3(L)  Hematocrit 36.0 - 46.0 % 36.9 39.5 35.1(L)  Platelets 150 - 400 K/uL 174 196 151     CMP Latest Ref Rng & Units 06/22/2021 06/08/2021 05/25/2021  Glucose 70 - 99 mg/dL 92 91 107(H)  BUN 6 - 20 mg/dL 15 16 11   Creatinine 0.44 - 1.00 mg/dL 0.95 0.80 1.05(H)  Sodium 135 - 145 mmol/L 141 139 144  Potassium 3.5 - 5.1 mmol/L 4.2 4.0 4.2  Chloride 98 - 111 mmol/L 108 106 111  CO2 22 - 32 mmol/L 23 26 23   Calcium 8.9 - 10.3 mg/dL 9.3 9.6 9.3  Total Protein 6.5 - 8.1 g/dL 7.2 7.4 6.8  Total Bilirubin 0.3 - 1.2 mg/dL 0.6 0.8 0.5  Alkaline Phos 38 - 126 U/L 62 62 58  AST 15 - 41 U/L 25 24 31   ALT 0 - 44 U/L 22 24 35      RADIOGRAPHIC STUDIES: I have personally reviewed the radiological images as listed and agreed with the findings in the report. No results found.    Orders Placed This Encounter  Procedures   CT CHEST ABDOMEN PELVIS W CONTRAST    Severe mid back pain for 2-3 days  Standing Status:   Future    Standing Expiration Date:   06/22/2022    Order Specific Question:   Is patient pregnant?    Answer:   No    Order Specific Question:   Preferred imaging location?    Answer:   [facility]    Order Specific Question:   Release to patient    Answer:   Immediate    Order Specific  Question:   Is Oral Contrast requested for this exam?    Answer:   Yes, Per Radiology protocol   All questions were answered. The patient knows to call the clinic with any problems, questions or concerns. No barriers to learning was detected. The total time spent in the appointment was 40 minutes.     Truitt Merle, MD 06/22/2021   I, Wilburn Mylar, am acting as scribe for Truitt Merle, MD.   I have reviewed the above documentation for accuracy and completeness, and I agree with the above.

## 2021-06-23 ENCOUNTER — Ambulatory Visit (HOSPITAL_COMMUNITY)
Admission: RE | Admit: 2021-06-23 | Discharge: 2021-06-23 | Disposition: A | Discharge status: Home / Self Care | Payer: 59 | Source: Ambulatory Visit | Attending: Hematology | Admitting: Hematology

## 2021-06-23 DIAGNOSIS — C250 Malignant neoplasm of head of pancreas: Secondary | ICD-10-CM | POA: Insufficient documentation

## 2021-06-23 MED ORDER — IOHEXOL 350 MG/ML SOLN
80.0000 mL | Freq: Once | INTRAVENOUS | Status: AC | PRN
  Administered 2021-06-23: 80 mL via INTRAVENOUS

## 2021-06-24 ENCOUNTER — Other Ambulatory Visit: Payer: Self-pay

## 2021-06-24 ENCOUNTER — Inpatient Hospital Stay: Payer: 59

## 2021-06-24 DIAGNOSIS — Z5111 Encounter for antineoplastic chemotherapy: Secondary | ICD-10-CM | POA: Diagnosis not present

## 2021-06-25 ENCOUNTER — Telehealth: Payer: Self-pay | Admitting: Hematology

## 2021-06-25 NOTE — Telephone Encounter (Signed)
I called pt and reviewed her CT scan findings, which showed stable disease, no new findings. I think her pain is related to her pancreatic cancer. Her pain is better controlled now with med, she has no additional concerns for now. She will come in tomorrow for my signature for her FMLA paper.   Truitt Merle  06/25/2021

## 2021-06-28 ENCOUNTER — Other Ambulatory Visit: Payer: Self-pay

## 2021-06-28 ENCOUNTER — Emergency Department (HOSPITAL_COMMUNITY): Payer: 59

## 2021-06-28 ENCOUNTER — Telehealth: Payer: Self-pay

## 2021-06-28 ENCOUNTER — Observation Stay (HOSPITAL_COMMUNITY)
Admission: EM | Admit: 2021-06-28 | Discharge: 2021-06-30 | Disposition: A | Discharge status: Home / Self Care | Payer: 59 | Attending: Emergency Medicine | Admitting: Emergency Medicine

## 2021-06-28 ENCOUNTER — Encounter (HOSPITAL_COMMUNITY): Payer: Self-pay | Admitting: Emergency Medicine

## 2021-06-28 DIAGNOSIS — J4520 Mild intermittent asthma, uncomplicated: Secondary | ICD-10-CM | POA: Diagnosis present

## 2021-06-28 DIAGNOSIS — E860 Dehydration: Secondary | ICD-10-CM

## 2021-06-28 DIAGNOSIS — U071 COVID-19: Secondary | ICD-10-CM | POA: Diagnosis present

## 2021-06-28 DIAGNOSIS — R1013 Epigastric pain: Secondary | ICD-10-CM | POA: Diagnosis present

## 2021-06-28 DIAGNOSIS — C259 Malignant neoplasm of pancreas, unspecified: Secondary | ICD-10-CM | POA: Diagnosis present

## 2021-06-28 DIAGNOSIS — J45909 Unspecified asthma, uncomplicated: Secondary | ICD-10-CM | POA: Diagnosis not present

## 2021-06-28 DIAGNOSIS — R55 Syncope and collapse: Principal | ICD-10-CM | POA: Diagnosis present

## 2021-06-28 DIAGNOSIS — G620 Drug-induced polyneuropathy: Secondary | ICD-10-CM | POA: Diagnosis present

## 2021-06-28 DIAGNOSIS — Z79899 Other long term (current) drug therapy: Secondary | ICD-10-CM | POA: Insufficient documentation

## 2021-06-28 DIAGNOSIS — R7989 Other specified abnormal findings of blood chemistry: Secondary | ICD-10-CM | POA: Diagnosis not present

## 2021-06-28 DIAGNOSIS — T451X5A Adverse effect of antineoplastic and immunosuppressive drugs, initial encounter: Secondary | ICD-10-CM | POA: Diagnosis present

## 2021-06-28 LAB — URINALYSIS, ROUTINE W REFLEX MICROSCOPIC
Bilirubin Urine: NEGATIVE
Glucose, UA: NEGATIVE mg/dL
Ketones, ur: NEGATIVE mg/dL
Leukocytes,Ua: NEGATIVE
Nitrite: NEGATIVE
Protein, ur: NEGATIVE mg/dL
Specific Gravity, Urine: 1.013 (ref 1.005–1.030)
pH: 6 (ref 5.0–8.0)

## 2021-06-28 LAB — COMPREHENSIVE METABOLIC PANEL
ALT: 458 U/L — ABNORMAL HIGH (ref 0–44)
AST: 505 U/L — ABNORMAL HIGH (ref 15–41)
Albumin: 3.8 g/dL (ref 3.5–5.0)
Alkaline Phosphatase: 149 U/L — ABNORMAL HIGH (ref 38–126)
Anion gap: 8 (ref 5–15)
BUN: 14 mg/dL (ref 6–20)
CO2: 23 mmol/L (ref 22–32)
Calcium: 8.5 mg/dL — ABNORMAL LOW (ref 8.9–10.3)
Chloride: 103 mmol/L (ref 98–111)
Creatinine, Ser: 1.15 mg/dL — ABNORMAL HIGH (ref 0.44–1.00)
GFR, Estimated: 56 mL/min — ABNORMAL LOW (ref >60)
Glucose, Bld: 122 mg/dL — ABNORMAL HIGH (ref 70–99)
Potassium: 3.7 mmol/L (ref 3.5–5.1)
Sodium: 134 mmol/L — ABNORMAL LOW (ref 135–145)
Total Bilirubin: 1.4 mg/dL — ABNORMAL HIGH (ref 0.3–1.2)
Total Protein: 7 g/dL (ref 6.5–8.1)

## 2021-06-28 LAB — CBC WITH DIFFERENTIAL/PLATELET
Abs Immature Granulocytes: 0.08 10*3/uL — ABNORMAL HIGH (ref 0.00–0.07)
Basophils Absolute: 0 10*3/uL (ref 0.0–0.1)
Basophils Relative: 0 %
Eosinophils Absolute: 0 10*3/uL (ref 0.0–0.5)
Eosinophils Relative: 0 %
HCT: 35.6 % — ABNORMAL LOW (ref 36.0–46.0)
Hemoglobin: 11.4 g/dL — ABNORMAL LOW (ref 12.0–15.0)
Immature Granulocytes: 3 %
Lymphocytes Relative: 14 %
Lymphs Abs: 0.5 10*3/uL — ABNORMAL LOW (ref 0.7–4.0)
MCH: 28.9 pg (ref 26.0–34.0)
MCHC: 32 g/dL (ref 30.0–36.0)
MCV: 90.4 fL (ref 80.0–100.0)
Monocytes Absolute: 0.3 10*3/uL (ref 0.1–1.0)
Monocytes Relative: 9 %
Neutro Abs: 2.4 10*3/uL (ref 1.7–7.7)
Neutrophils Relative %: 74 %
Platelets: 127 10*3/uL — ABNORMAL LOW (ref 150–400)
RBC: 3.94 MIL/uL (ref 3.87–5.11)
RDW: 13.9 % (ref 11.5–15.5)
WBC: 3.2 10*3/uL — ABNORMAL LOW (ref 4.0–10.5)
nRBC: 0 % (ref 0.0–0.2)

## 2021-06-28 LAB — LIPASE, BLOOD: Lipase: 22 U/L (ref 11–51)

## 2021-06-28 LAB — CBG MONITORING, ED: Glucose-Capillary: 112 mg/dL — ABNORMAL HIGH (ref 70–99)

## 2021-06-28 MED ORDER — SODIUM CHLORIDE 0.9 % IV BOLUS
1000.0000 mL | Freq: Once | INTRAVENOUS | Status: AC
  Administered 2021-06-29: 1000 mL via INTRAVENOUS

## 2021-06-28 MED ORDER — SODIUM CHLORIDE 0.9 % IV BOLUS
1000.0000 mL | Freq: Once | INTRAVENOUS | Status: AC
  Administered 2021-06-28: 1000 mL via INTRAVENOUS

## 2021-06-28 MED ORDER — IOHEXOL 350 MG/ML SOLN
80.0000 mL | Freq: Once | INTRAVENOUS | Status: AC | PRN
  Administered 2021-06-28: 80 mL via INTRAVENOUS

## 2021-06-28 NOTE — ED Provider Notes (Signed)
[facility] DEPT Provider Note   CSN: [id number] Arrival date & time: 06/28/21  1929     History Chief Complaint  Patient presents with   Hypotension   Loss of Consciousness    Robin [name] is a 55 y.o. female with a past medical history significant for asthma, bipolar disorder, IBS, history of pancreatic cancer on chemotherapy every other Thursday with last session last week who presents to the ED after a syncopal episode.  Patient states she was standing in an elevator and woke up on the ground.  Patient found to be extremely hypotensive at 70/40.  Patient endorses generalized weakness and fatigue for the past few days.  Patient discussed symptoms with cancer center earlier today and is scheduled for IV fluids tomorrow due to suspected dehydration.  Patient denies nausea and vomiting.  No diarrhea.  Patient denies preceding chest pain or shortness of breath.  No treatment prior to arrival.  History obtained from patient and past medical records. No interpreter used during encounter.       Past Medical History:  Diagnosis Date   Asthma    Bipolar disorder ([place])    Diverticulosis    Gestational hypertension    HA (headache)    History of borderline personality disorder    Hypersomnia, persistent 04/29/2013   IBS (irritable bowel syndrome)    Memory loss, short term    [name] 04-03-13    Migraine    Pancreatic cancer ([place])    Reflux esophagitis    Seasonal allergies     Patient Active Problem List   Diagnosis Date Noted   Family history of breast cancer 06/09/2021   Pain in the abdomen 04/05/2021   Port-A-Cath in place 03/16/2021   Pancreatic cancer ([place]) 02/13/2021   Pancreatic mass 02/12/2021   Protein-calorie malnutrition, severe 02/09/2021   Abdominal pain    Biliary stricture    Hyperbilirubinemia    Elevated LFTs 01/11/2021   Hyperglycemia 12/30/2019   Dyslipidemia 12/30/2019   DDD (degenerative disc disease), lumbar  10/10/2017   Primary narcolepsy without cataplexy 09/03/2017   Syncope 07/08/2017   Diverticulosis 05/01/2017   Stomach ulcer 04/01/2017   Narcolepsy due to underlying condition without cataplexy 07/18/2016   Chronic pansinusitis 01/27/2016   History of colonoscopy with polypectomy 04/14/2015   Bipolar I disorder ([place]) 11/23/2014   Obesity 12/23/2013   Sleep apnea 11/25/2013   Migraine 10/28/2013   Hypersomnia, persistent 04/29/2013   Memory loss, short term    Cognitive decline 04/03/2013   Depression 04/03/2013    Past Surgical History:  Procedure Laterality Date   ABDOMINAL HYSTERECTOMY  2000   BILIARY BRUSHING  01/13/2021   Procedure: BILIARY BRUSHING;  Surgeon: Gatha Mayer, MD;  Location: [facility];  Service: Endoscopy;;   BILIARY DILATION  02/09/2021   Procedure: BILIARY DILATION;  Surgeon: Irving Copas., MD;  Location: [facility];  Service: Gastroenterology;;   BILIARY STENT PLACEMENT N/A 01/13/2021   Procedure: BILIARY STENT PLACEMENT;  Surgeon: Gatha Mayer, MD;  Location: [facility];  Service: Endoscopy;  Laterality: N/A;   BILIARY STENT PLACEMENT  02/09/2021   Procedure: BILIARY STENT PLACEMENT;  Surgeon: Irving Copas., MD;  Location: [facility];  Service: Gastroenterology;;   BIOPSY  01/13/2021   Procedure: BIOPSY;  Surgeon: Gatha Mayer, MD;  Location: [facility];  Service: Endoscopy;;   BIOPSY  02/09/2021   Procedure: BIOPSY;  Surgeon: Irving Copas., MD;  Location: [facility];  Service: Gastroenterology;;  COLONOSCOPY  01/2020   ERCP N/A 01/13/2021   Procedure: ENDOSCOPIC RETROGRADE CHOLANGIOPANCREATOGRAPHY (ERCP);  Surgeon: Gatha Mayer, MD;  Location: [facility];  Service: Endoscopy;  Laterality: N/A;   ERCP N/A 02/09/2021   Procedure: ENDOSCOPIC RETROGRADE CHOLANGIOPANCREATOGRAPHY (ERCP);  Surgeon: Irving Copas., MD;  Location: [facility];  Service: Gastroenterology;  Laterality: N/A;    ESOPHAGOGASTRODUODENOSCOPY (EGD) WITH PROPOFOL N/A 02/09/2021   Procedure: ESOPHAGOGASTRODUODENOSCOPY (EGD) WITH PROPOFOL;  Surgeon: Rush Landmark Telford Nab., MD;  Location: [facility];  Service: Gastroenterology;  Laterality: N/A;   EUS N/A 02/09/2021   Procedure: UPPER ENDOSCOPIC ULTRASOUND (EUS) LINEAR;  Surgeon: Irving Copas., MD;  Location: [facility];  Service: Gastroenterology;  Laterality: N/A;   FINE NEEDLE ASPIRATION  02/09/2021   Procedure: FINE NEEDLE ASPIRATION (FNA) LINEAR;  Surgeon: Irving Copas., MD;  Location: [facility];  Service: Gastroenterology;;   KNEE SURGERY Right 96,98,00,02,05,2014   NASAL SINUS SURGERY     NOSE SURGERY     [phone]   PORTACATH PLACEMENT Right 02/17/2021   Procedure: INSERTION PORT-A-CATH;  Surgeon: Dwan Bolt, MD;  Location: [facility];  Service: General;  Laterality: Right;  LMA ANESTHESIA   REPLACEMENT TOTAL KNEE Right    SPHINCTEROTOMY  01/13/2021   Procedure: SPHINCTEROTOMY;  Surgeon: Gatha Mayer, MD;  Location: [facility];  Service: Endoscopy;;   STENT REMOVAL  02/09/2021   Procedure: Lavell Islam REMOVAL;  Surgeon: Irving Copas., MD;  Location: [facility];  Service: Gastroenterology;;   WRIST SURGERY Right [phone]     OB History   No obstetric history on file.     Family History  Problem Relation Age of Onset   Depression Mother    High Cholesterol Mother    Hypertension Mother    Heart failure Mother    Hypertension Father    Cancer Father        ? prostate or colon; dx after 41   Schizophrenia Brother    Breast cancer Maternal Grandmother        dx after 71   Diabetes Maternal Grandmother    High blood pressure Maternal Grandmother    Heart disease Maternal Grandmother    High blood pressure Maternal Grandfather    Heart attack Maternal Grandfather    Breast cancer Half-Sister 36   Breast cancer Half-Sister        paternal half sister; dx late 79s-early 60s   Breast cancer Maternal  Great-grandmother        MGM's mother; dx after 52   Colon cancer Neg Hx    Esophageal cancer Neg Hx    Rectal cancer Neg Hx    Stomach cancer Neg Hx     Social History   Tobacco Use   Smoking status: Never   Smokeless tobacco: Never  Vaping Use   Vaping Use: Never used  Substance Use Topics   Alcohol use: No   Drug use: No    Home Medications Prior to Admission medications   Medication Sig Start Date End Date Taking? Authorizing Provider  acetaminophen (TYLENOL) 325 MG tablet Take 325-650 mg by mouth every 6 (six) hours as needed for mild pain or headache.    [provider]  albuterol (VENTOLIN HFA) 108 (90 Base) MCG/ACT inhaler Inhale 2 puffs into the lungs every 6 (six) hours as needed for wheezing or shortness of breath.    [provider]  cyclobenzaprine (FLEXERIL) 5 MG tablet TAKE 1 TABLET(5 MG) BY MOUTH AT BEDTIME AS NEEDED FOR LOWER BACK  PAIN OR SEVERE HEADACHE Patient taking differently: Take 5 mg by mouth at bedtime as needed (for lower back pain or severe headaches). 08/29/20   Narda Amber K, DO  dicyclomine (BENTYL) 10 MG capsule Take 1 capsule (10 mg total) by mouth 4 (four) times daily -  before meals and at bedtime. 05/11/21   Alla Feeling, NP  diphenoxylate-atropine (LOMOTIL) 2.5-0.025 MG tablet Take 1-2 tabs by mouth 4 times a day as needed for loose stools. 03/10/21   Truitt Merle, MD  EPINEPHrine 0.3 mg/0.3 mL IJ SOAJ injection Inject 0.3 mg into the muscle as needed for anaphylaxis. INJECT 0.3 MLS (0.3 MG TOTAL) INTO THE MUSCLE ONCE Patient taking differently: Inject 0.3 mg into the muscle once as needed for anaphylaxis. 12/14/20   [name], MD  fluticasone [facility]) 50 MCG/ACT nasal spray Place 2 sprays into both nostrils daily.    [provider]  gabapentin (NEURONTIN) 300 MG capsule Take 300 mg by mouth 3 (three) times daily as needed (for neuropathic pain). 12/13/20   [provider]  LATUDA 40 MG TABS tablet Take 1  tablet (40 mg total) by mouth daily with breakfast. 10/10/17   Dara Hoyer, PA-C  lidocaine-prilocaine (EMLA) cream Apply to affected area once Patient taking differently: Apply 1 application topically as directed. 02/14/21   Truitt Merle, MD  magic mouthwash w/lidocaine SOLN Take 10 mLs by mouth 4 (four) times daily as needed for mouth pain. 10 mls swish and spit by mouth four times daily as needed 04/27/21   Alla Feeling, NP  montelukast (SINGULAIR) 10 MG tablet Take 10 mg by mouth daily.    [provider]  morphine (MS CONTIN) 15 MG 12 hr tablet Take 1 tablet (15 mg total) by mouth every 12 (twelve) hours. 06/17/21   Truitt Merle, MD  ondansetron (ZOFRAN) 8 MG tablet Take 1 tablet (8 mg total) by mouth every 8 (eight) hours as needed for nausea or vomiting. Starting on day 3 after chemotherapy 02/13/21   Heilingoetter, Cassandra L, PA-C  Oxycodone HCl 10 MG TABS Take 1 tablet (10 mg total) by mouth every 6 (six) hours as needed. 06/17/21   Truitt Merle, MD  PRESCRIPTION MEDICATION Inject into the vein See admin instructions. FOLFIRINOX- Every 14 days for 8 treatments    [provider]  prochlorperazine (COMPAZINE) 10 MG tablet Take 1 tablet (10 mg total) by mouth every 6 (six) hours as needed for vomiting or nausea. 05/25/21   Truitt Merle, MD  promethazine (PHENERGAN) 25 MG tablet Take 25 mg by mouth every 6 (six) hours as needed for vomiting or nausea. 02/08/21   [provider]  Pyridoxine HCl (VITAMIN B6 PO) Take by mouth.    [provider]    Allergies    Aspirin, Bee venom, Penicillin g, Penicillins, Bactrim [sulfamethoxazole-trimethoprim], Coconut oil, Sulfa antibiotics, and Sulfamethoxazole-trimethoprim  Review of Systems   Review of Systems  Constitutional:  Positive for fatigue. Negative for chills and fever.  Respiratory:  Negative for shortness of breath.   Cardiovascular:  Negative for chest pain and leg swelling.  Gastrointestinal:  Negative for  abdominal pain, diarrhea, nausea and vomiting.  Neurological:  Positive for syncope and weakness.  All other systems reviewed and are negative.  Physical Exam Updated Vital Signs BP (!) 111/52   Pulse 85   Temp 98 F (36.7 C) (Oral)   Resp 14   Ht 5' 9"  (1.753 m)   Wt 90.7 kg  SpO2 100%   BMI 29.53 kg/m   Physical Exam Vitals and nursing note reviewed.  Constitutional:      General: She is not in acute distress.    Appearance: She is not ill-appearing.  HENT:     Head: Normocephalic.  Eyes:     Pupils: Pupils are equal, round, and reactive to light.  Cardiovascular:     Rate and Rhythm: Normal rate and regular rhythm.     Pulses: Normal pulses.     Heart sounds: Normal heart sounds. No murmur heard.   No friction rub. No gallop.  Pulmonary:     Effort: Pulmonary effort is normal.     Breath sounds: Normal breath sounds.  Abdominal:     General: Abdomen is flat. There is no distension.     Palpations: Abdomen is soft.     Tenderness: There is abdominal tenderness. There is no guarding or rebound.     Comments: Epigastric tenderness  Musculoskeletal:        General: Normal range of motion.     Cervical back: Neck supple.  Skin:    General: Skin is warm and dry.  Neurological:     General: No focal deficit present.     Mental Status: She is alert.  Psychiatric:        Mood and Affect: Mood normal.        Behavior: Behavior normal.    ED Results / Procedures / Treatments   Labs (all labs ordered are listed, but only abnormal results are displayed) Labs Reviewed  URINALYSIS, ROUTINE W REFLEX MICROSCOPIC - Abnormal; Notable for the following components:      Result Value   Hgb urine dipstick SMALL (*)    Bacteria, UA RARE (*)    All other components within normal limits  COMPREHENSIVE METABOLIC PANEL - Abnormal; Notable for the following components:   Sodium 134 (*)    Glucose, Bld 122 (*)    Creatinine, Ser 1.15 (*)    Calcium 8.5 (*)    AST 505 (*)     ALT 458 (*)    Alkaline Phosphatase 149 (*)    Total Bilirubin 1.4 (*)    GFR, Estimated 56 (*)    All other components within normal limits  CBC WITH DIFFERENTIAL/PLATELET - Abnormal; Notable for the following components:   WBC 3.2 (*)    Hemoglobin 11.4 (*)    HCT 35.6 (*)    Platelets 127 (*)    Lymphs Abs 0.5 (*)    Abs Immature Granulocytes 0.08 (*)    All other components within normal limits  CBG MONITORING, ED - Abnormal; Notable for the following components:   Glucose-Capillary 112 (*)    All other components within normal limits  CULTURE, BLOOD (ROUTINE X 2)  CULTURE, BLOOD (ROUTINE X 2)  LIPASE, BLOOD  LACTIC ACID, PLASMA  LACTIC ACID, PLASMA    EKG EKG Interpretation  Date/Time:  Wednesday June 28 2021 19:40:18 EDT Ventricular Rate:  99 PR Interval:  117 QRS Duration: 74 QT Interval:  332 QTC Calculation: 426 R Axis:   55 Text Interpretation: Sinus rhythm Confirmed by Fredia Sorrow [phone]) on 06/28/2021 7:48:00 PM  Radiology CT ABDOMEN PELVIS W CONTRAST  Result Date: 06/28/2021 CLINICAL DATA:  Abdominal pain, nausea, vomiting EXAM: CT ABDOMEN AND PELVIS WITH CONTRAST TECHNIQUE: Multidetector CT imaging of the abdomen and pelvis was performed using the standard protocol following bolus administration of intravenous contrast. CONTRAST:  4m OMNIPAQUE IOHEXOL 350 MG/ML SOLN  COMPARISON:  06/23/2021 FINDINGS: Lower chest: The visualized lung bases are clear. Visualized heart and pericardium are unremarkable. Asymmetric wall thickening within the tiny hiatal hernia appears stable since prior PET CT examination and was metabolically negative at that time. Hepatobiliary: Hemangioma within the right hepatic dome is unchanged. Multiple scattered hepatic cysts are again identified. Suspected metastatic lesion within the subserosal inferior right hepatic lobe is not well appreciated on this examination. Pneumobilia again noted. No intra or extrahepatic biliary ductal  dilation. Palliative biliary stent noted within the extrahepatic bile duct. The gallbladder is unremarkable. Pancreas: Incomplete pancreatic divisum noted. The fullness of the pancreatic head previously noted is stable. Mild hypodensity surrounding the palliative biliary stent within the pancreatic head is nonspecific but appears new since prior examination, possibly representing the patient's primary mass or peribiliary edema. The pancreatic duct is not dilated. The remainder of the pancreatic parenchyma demonstrates normal enhancement. Spleen: Unremarkable Adrenals/Urinary Tract: Adrenal glands are unremarkable. Kidneys are normal, without renal calculi, focal lesion, or hydronephrosis. Bladder is unremarkable. Stomach/Bowel: The stomach, large bowel, and small bowel are unremarkable. Appendix normal. No free intraperitoneal gas or fluid. Vascular/Lymphatic: Aortic atherosclerosis. No enlarged abdominal or pelvic lymph nodes. Reproductive: Status post hysterectomy. No adnexal masses. Other: No abdominal wall hernia. Musculoskeletal: No acute bone abnormality. No lytic or blastic bone lesion. IMPRESSION: No acute intra-abdominal pathology identified. No definite radiographic explanation for the patient's reported symptoms. Palliative biliary stent unchanged within the distal common duct. Interval development of mild peribiliary hypoattenuation within the pancreatic head which may represent the patient's primary mass or peribiliary edema, but is nonspecific. Known hepatic metastasis is not well visualized on this examination. Aortic Atherosclerosis (ICD10-I70.0). Electronically Signed   By: Fidela Salisbury M.D.   On: 06/28/2021 22:48    Procedures Procedures   Medications Ordered in ED Medications  sodium chloride 0.9 % bolus 1,000 mL (has no administration in time range)  sodium chloride 0.9 % bolus 1,000 mL (1,000 mLs Intravenous New Bag/Given 06/28/21 2118)  iohexol (OMNIPAQUE) 350 MG/ML injection 80 mL  (80 mLs Intravenous Contrast Given 06/28/21 2219)    ED Course  I have reviewed the triage vital signs and the nursing notes.  Pertinent labs & imaging results that were available during my care of the patient were reviewed by me and considered in my medical decision making (see chart for details).  Clinical Course as of 06/28/21 2352  Wed Jun 28, 2021  2141 AST(!): 505 [CA]  2141 ALT(!): 458 [CA]  2141 Alkaline Phosphatase(!): 149 [CA]    Clinical Course User Index [CA] Karie Kirks   MDM Rules/Calculators/A&P                          55 year old female presents to the ED after a syncopal episode.  Patient currently undergoing chemotherapy for pancreatic cancer.  Upon EMS arrival after syncopal episode, patient found to be hypotensive at 70/40.  Patient endorses generalized weakness and fatigue for the past few days.  Upon arrival, patient afebrile, not tachycardic or hypoxic.  BP 112/43.  Patient started on IV fluids upon arrival.  Routine labs ordered.  No preceding chest pain or shortness of breath to suggest cardiac etiology of syncopal episode.  Suspect syncopal episode related to hypotension. Discussed with Dr. Rogene Houston who agrees with assessment and plan.   CBC significant for leukopenia at 3.2 and anemia with hemoglobin at 11.4. UA negative for signs of infection. CMP significant for elevated LFTs.  AST 505, ALT at 458, Alk phos at 149. LFTs appear to have been normal 6 days ago. CT abdomen negative for any acute abnormalities.   11:34 PM Discussed with [name] with oncology who recommends obtaining lactic acid and blood cultures. If patient decides to go home rather than admission, she can follow-up this week in the clinic.   Care handed off to [facility], PA-C at shift change pending lactic acid and IVFs who will determine need for admission vs. Discharge. RUQ [place] pending.  Final Clinical Impression(s) / ED Diagnoses Final diagnoses:  Syncope, unspecified  syncope type  Elevated LFTs    Rx / DC Orders ED Discharge Orders     None        Karie Kirks 06/28/21 2352    Fredia Sorrow, MD 07/14/21 [phone]

## 2021-06-28 NOTE — ED Provider Notes (Signed)
Care assumed from [facility] Please see her full H&P.  In short,  Robin [name] is a 55 y.o. female presents for syncope and hypotension.  Pt with hx of pancreatic cancer treated at the [facility]. No prodrome prior to syncope. Arrived with BP 70/40. Suspected dehydrated and was scheduled for IVF tomorrow at the cancer center.    W/U here in the ED with sinificantly elevated LFTs when compared to 6 days ago.  Initial provider discussed with oncology who recommends Lactic acid and blood cultures.  RUQ [place] also ordered.  [name] reassess orthostatics after IVF here.  Pt wishes to go home if possible.    Physical Exam  BP (!) 111/52   Pulse 85   Temp 98 F (36.7 C) (Oral)   Resp 14   Ht 5\' 9"  (1.753 m)   Wt 90.7 kg   SpO2 100%   BMI 29.53 kg/m   Physical Exam Vitals and nursing note reviewed.  Constitutional:      General: She is not in acute distress.    Appearance: She is well-developed. She is not ill-appearing.  HENT:     Head: Normocephalic.  Eyes:     General: No scleral icterus.    Conjunctiva/sclera: Conjunctivae normal.  Cardiovascular:     Rate and Rhythm: Normal rate.  Pulmonary:     Effort: Pulmonary effort is normal.  Abdominal:     General: There is no distension.  Musculoskeletal:        General: Normal range of motion.     Cervical back: Normal range of motion.  Skin:    General: Skin is warm and dry.  Neurological:     Mental Status: She is alert.  Psychiatric:        Mood and Affect: Mood normal.    ED Course/Procedures   Clinical Course as of 06/29/21 0453  Wed Jun 28, 2021  2141 AST(!): 505 [CA]  2141 ALT(!): 458 [CA]  2141 Alkaline Phosphatase(!): 149 [CA]    Clinical Course User Index [CA] Suzy Bouchard, PA-C    Procedures  Results for orders placed or performed during the hospital encounter of 06/28/21  Urinalysis, Routine w reflex microscopic Urine, Clean Catch  Result Value Ref Range   Color, Urine YELLOW YELLOW    APPearance CLEAR CLEAR   Specific Gravity, Urine 1.013 1.005 - 1.030   pH 6.0 5.0 - 8.0   Glucose, UA NEGATIVE NEGATIVE mg/dL   Hgb urine dipstick SMALL (A) NEGATIVE   Bilirubin Urine NEGATIVE NEGATIVE   Ketones, ur NEGATIVE NEGATIVE mg/dL   Protein, ur NEGATIVE NEGATIVE mg/dL   Nitrite NEGATIVE NEGATIVE   Leukocytes,Ua NEGATIVE NEGATIVE   RBC / HPF 6-10 0 - 5 RBC/hpf   WBC, UA 0-5 0 - 5 WBC/hpf   Bacteria, UA RARE (A) NONE SEEN   Squamous Epithelial / LPF 0-5 0 - 5   Mucus PRESENT   Comprehensive metabolic panel  Result Value Ref Range   Sodium 134 (L) 135 - 145 mmol/L   Potassium 3.7 3.5 - 5.1 mmol/L   Chloride 103 98 - 111 mmol/L   CO2 23 22 - 32 mmol/L   Glucose, Bld 122 (H) 70 - 99 mg/dL   BUN 14 6 - 20 mg/dL   Creatinine, Ser 1.15 (H) 0.44 - 1.00 mg/dL   Calcium 8.5 (L) 8.9 - 10.3 mg/dL   Total Protein 7.0 6.5 - 8.1 g/dL   Albumin 3.8 3.5 - 5.0 g/dL   AST  505 (H) 15 - 41 U/L   ALT 458 (H) 0 - 44 U/L   Alkaline Phosphatase 149 (H) 38 - 126 U/L   Total Bilirubin 1.4 (H) 0.3 - 1.2 mg/dL   GFR, Estimated 56 (L) >60 mL/min   Anion gap 8 5 - 15  CBC with Differential  Result Value Ref Range   WBC 3.2 (L) 4.0 - 10.5 K/uL   RBC 3.94 3.87 - 5.11 MIL/uL   Hemoglobin 11.4 (L) 12.0 - 15.0 g/dL   HCT 35.6 (L) 36.0 - 46.0 %   MCV 90.4 80.0 - 100.0 fL   MCH 28.9 26.0 - 34.0 pg   MCHC 32.0 30.0 - 36.0 g/dL   RDW 13.9 11.5 - 15.5 %   Platelets 127 (L) 150 - 400 K/uL   nRBC 0.0 0.0 - 0.2 %   Neutrophils Relative % 74 %   Neutro Abs 2.4 1.7 - 7.7 K/uL   Lymphocytes Relative 14 %   Lymphs Abs 0.5 (L) 0.7 - 4.0 K/uL   Monocytes Relative 9 %   Monocytes Absolute 0.3 0.1 - 1.0 K/uL   Eosinophils Relative 0 %   Eosinophils Absolute 0.0 0.0 - 0.5 K/uL   Basophils Relative 0 %   Basophils Absolute 0.0 0.0 - 0.1 K/uL   Immature Granulocytes 3 %   Abs Immature Granulocytes 0.08 (H) 0.00 - 0.07 K/uL   Ovalocytes PRESENT   Lipase, blood  Result Value Ref Range   Lipase 22 11 - 51  U/L  Lactic acid, plasma  Result Value Ref Range   Lactic Acid, Venous 0.9 0.5 - 1.9 mmol/L  CBG monitoring, ED  Result Value Ref Range   Glucose-Capillary 112 (H) 70 - 99 mg/dL   CT ABDOMEN PELVIS W CONTRAST  Result Date: 06/28/2021 CLINICAL DATA:  Abdominal pain, nausea, vomiting EXAM: CT ABDOMEN AND PELVIS WITH CONTRAST TECHNIQUE: Multidetector CT imaging of the abdomen and pelvis was performed using the standard protocol following bolus administration of intravenous contrast. CONTRAST:  42mL OMNIPAQUE IOHEXOL 350 MG/ML SOLN COMPARISON:  06/23/2021 FINDINGS: Lower chest: The visualized lung bases are clear. Visualized heart and pericardium are unremarkable. Asymmetric wall thickening within the tiny hiatal hernia appears stable since prior PET CT examination and was metabolically negative at that time. Hepatobiliary: Hemangioma within the right hepatic dome is unchanged. Multiple scattered hepatic cysts are again identified. Suspected metastatic lesion within the subserosal inferior right hepatic lobe is not well appreciated on this examination. Pneumobilia again noted. No intra or extrahepatic biliary ductal dilation. Palliative biliary stent noted within the extrahepatic bile duct. The gallbladder is unremarkable. Pancreas: Incomplete pancreatic divisum noted. The fullness of the pancreatic head previously noted is stable. Mild hypodensity surrounding the palliative biliary stent within the pancreatic head is nonspecific but appears new since prior examination, possibly representing the patient's primary mass or peribiliary edema. The pancreatic duct is not dilated. The remainder of the pancreatic parenchyma demonstrates normal enhancement. Spleen: Unremarkable Adrenals/Urinary Tract: Adrenal glands are unremarkable. Kidneys are normal, without renal calculi, focal lesion, or hydronephrosis. Bladder is unremarkable. Stomach/Bowel: The stomach, large bowel, and small bowel are unremarkable. Appendix  normal. No free intraperitoneal gas or fluid. Vascular/Lymphatic: Aortic atherosclerosis. No enlarged abdominal or pelvic lymph nodes. Reproductive: Status post hysterectomy. No adnexal masses. Other: No abdominal wall hernia. Musculoskeletal: No acute bone abnormality. No lytic or blastic bone lesion. IMPRESSION: No acute intra-abdominal pathology identified. No definite radiographic explanation for the patient's reported symptoms. Palliative biliary stent unchanged within the distal  common duct. Interval development of mild peribiliary hypoattenuation within the pancreatic head which may represent the patient's primary mass or peribiliary edema, but is nonspecific. Known hepatic metastasis is not well visualized on this examination. Aortic Atherosclerosis (ICD10-I70.0). Electronically Signed   By: Fidela Salisbury M.D.   On: 06/28/2021 22:48   CT CHEST ABDOMEN PELVIS W CONTRAST  Result Date: 06/23/2021 CLINICAL DATA:  Severe mid back pain for 2-3 days. Malignant neoplasm of head of pancreas. Active chemotherapy. EXAM: CT CHEST, ABDOMEN, AND PELVIS WITH CONTRAST TECHNIQUE: Multidetector CT imaging of the chest, abdomen and pelvis was performed following the standard protocol during bolus administration of intravenous contrast. CONTRAST:  68mL OMNIPAQUE IOHEXOL 350 MG/ML SOLN COMPARISON:  Abdominopelvic CT 04/25/2021.  PET CT 02/28/2021. FINDINGS: CT CHEST FINDINGS Cardiovascular: Right chest port in place with tip in the SVC. Normal caliber thoracic aorta. No evidence of aortic dissection. No central pulmonary embolus in this exam not tailored to pulmonary artery evaluation. The heart is normal in size. Occasional coronary artery calcifications. No pericardial effusion. Mediastinum/Nodes: Small mediastinal lymph nodes are not enlarged by size criteria. There is no hilar adenopathy. Tiny hiatal hernia. The small right thyroid nodule on prior PET is not confidently seen by CT small area. Lungs/Pleura: Similar  appearance of small ground-glass opacity in the right upper lobe, series 12, image 53. Minimal biapical pleuroparenchymal scarring. No pulmonary nodule or mass. No focal airspace disease. No pleural effusion. Small fat containing left Bochdalek hernia. Musculoskeletal: No evidence of lytic, destructive, or blastic osseous lesion. Thoracic vertebral body heights are normal. No acute or compression fracture. No chest wall soft tissue abnormality. CT ABDOMEN PELVIS FINDINGS Hepatobiliary: Hemangioma in the central upper liver measures 2.9 cm, series 2, image 12, this is not significantly changed from prior exam. Small subcentimeter subcapsular hypodense lesions in the left lobe series 7, image 86 and in the right lobe series 7, image 119, unchanged from prior exam. Subcapsular subcentimeter hypodense lesion in the medial right hepatic lobe adjacent to the right kidney, series 7, image 96 measuring approximately 4 mm, is not significantly changed. Additional small hypodensities in the left lobe series 7, image 90 and series 7, image 98, unchanged. There are no new or progressive liver lesions. Again seen pneumobilia. There is a biliary stent in place. There is high-density material within the stent that is new from prior exam. High-density material is present in the common bile duct. Decompressed gallbladder containing air and small amount of high-density. Pancreas: No discrete pancreatic mass. Similar fullness of the pancreatic head to prior exam. Pancreatic duct measures up to 3 mm. There is no peripancreatic fat stranding or inflammation. Spleen: Normal in size without focal abnormality. Adrenals/Urinary Tract: No adrenal nodule. Homogeneous renal enhancement with symmetric excretion on delayed phase imaging. No hydronephrosis or perinephric inflammation. Unremarkable urinary bladder. Stomach/Bowel: Small hiatal hernia. Stomach is otherwise unremarkable. Enteric contrast reaches the transverse colon. No small bowel  obstruction or inflammation. Normal appendix. There is a moderate volume of colonic stool. No colonic wall thickening or inflammation. Minor sigmoid colonic diverticulosis without diverticulitis. Vascular/Lymphatic: Mild aortic atherosclerosis. No aortic aneurysm or inflammatory change. The portal vein appears patent. No enlarged lymph nodes in the abdomen or pelvis. Reproductive: Hysterectomy.  No adnexal mass. Other: No free air or ascites. No abdominal wall hernia. No evidence of omental thickening. Musculoskeletal: No lytic, destructive, or blastic osseous lesions. There is minor anterior endplate spurring at multiple levels. Mild multilevel facet hypertrophy. 1 No fracture or acute osseous abnormalities  are seen. Abdominal wall soft tissues are unremarkable. IMPRESSION: 1. Biliary stent in place. High-density material within the biliary stent as well as in the common bile duct. This has same density of adjacent enteric contrast, and is suspicious for reflux of contrast from the duodenum. 2. No evidence of osseous metastatic disease or osseous findings to explain back pain. 3. Unchanged fullness of the pancreatic head. As noted on prior exam, the hypermetabolic lesion on comparison PET is essentially CT occult. 4. Unchanged hepatic hemangioma. Small subcentimeter hypodense lesions in the liver, unchanged from prior exam. 5. No evidence of new metastatic disease in the chest, abdomen, or pelvis. 6. Minimal sigmoid colonic diverticulosis without diverticulitis. 7. Similar appearance of small ground-glass opacity in the right upper lobe. This is stable dating back to 02/08/2021 CT. Electronically Signed   By: Keith Rake M.D.   On: 06/23/2021 17:35   US Abdomen Limited  Result Date: 06/29/2021 CLINICAL DATA:  Elevated LFTs, pancreatic cancer, biliary stent in place EXAM: ULTRASOUND ABDOMEN LIMITED RIGHT UPPER QUADRANT COMPARISON:  CT abdomen/pelvis dated 06/28/2021 FINDINGS: Gallbladder: Mild gallbladder  wall thickening/edema. No cholelithiasis, gallbladder distention, or pericholecystic fluid. Negative sonographic Murphy's sign. Common bile duct: Diameter: 3 mm.  Indwelling common duct stent. Liver: 2.5 x 2.43.5 cm hyperechoic lesion in the right hepatic lobe, likely corresponding to the patient's known benign hemangioma. 10 x 9 x 10 mm cyst in the right hepatic lobe. Additional lesions are better evaluated on prior CTs. Pneumobilia. Portal vein is patent on color Doppler imaging with normal direction of blood flow towards the liver. Other: None. IMPRESSION: Indwelling common duct stent. Benign hemangioma and hepatic cyst. Additional hepatic lesions are better evaluated on prior CTs. Mild gallbladder wall thickening/edema, without sonographic findings to suggest acute cholecystitis. Electronically Signed   By: Julian Hy M.D.   On: 06/29/2021 01:08     MDM    11:50 PM Plan: Reassess after IV fluids.  If abnormal lactic [name] admit.  If normal and patient feels well she may be d/c home with close oncology follow-up.  12:50 AM Patient with ongoing pain.  Pain medication ordered.  Await all ultrasound.  2:00 AM Lactic acid within normal limits.  Blood cultures pending.  Ultrasound with thickening and edema of the gallbladder wall.  This is concerning for cholecystitis to me.  Stent is in place but unclear if it is patent.  This may also be the cause of her transaminitis and elevated total bili.  Given high risk syncope, ongoing concern for elevated transaminase and pain [name] admit for further evaluation and potential GI evaluation.  Orthostatic VS for the past 24 hrs:  BP- Lying Pulse- Lying BP- Sitting Pulse- Sitting BP- Standing at 0 minutes Pulse- Standing at 0 minutes  06/29/21 0323 113/66 87 111/68 95 108/65 103   Patient no longer orthostatic after fluids.  4:52 AM Discussed with Dr. Cyd Silence who [name] admit.     Syncope and collapse  Elevated LFTs  Dehydration      Robin [name], Gwenlyn Perking 06/29/21 0454    Veryl Speak, MD 06/29/21 [phone]

## 2021-06-28 NOTE — ED Notes (Signed)
Patient transported to CT 

## 2021-06-28 NOTE — Telephone Encounter (Signed)
Pt's wife Adda Stokes called statin that the pt is extremely fatigue and was slightly confused this morning which she said has resolved since this morning.  Pt's wife report -she denied diarrhea, constipation, fevers, or pain.  Shon report the pt has been eating and drinking but not much.  Pt is sleeping a lot and reporting that she's tired.  Notified pt's wife of the [facility] appt for port flush w/labs @1030  and [facility] @1100  for hydration and further assessment.

## 2021-06-28 NOTE — Progress Notes (Signed)
Orders for CBC w/Diff, CMP, and 1L of NS over 2hrs.  Further assessment will be done while pt is in [facility] based on labs and etc.

## 2021-06-28 NOTE — ED Triage Notes (Signed)
Patient BIB EMS. Patient has chemo every other Thursday, chemo last week. Patient sitting in a truck when she had her syncopal episode, no fall or injury. Patient pressure stable at 70/40 during transport. Patient states not feeling well over the last week.

## 2021-06-29 ENCOUNTER — Encounter (HOSPITAL_COMMUNITY): Payer: Self-pay | Admitting: Internal Medicine

## 2021-06-29 ENCOUNTER — Other Ambulatory Visit: Payer: 59

## 2021-06-29 ENCOUNTER — Observation Stay (HOSPITAL_COMMUNITY): Payer: 59

## 2021-06-29 ENCOUNTER — Ambulatory Visit: Payer: 59

## 2021-06-29 ENCOUNTER — Observation Stay (HOSPITAL_BASED_OUTPATIENT_CLINIC_OR_DEPARTMENT_OTHER): Payer: 59

## 2021-06-29 ENCOUNTER — Encounter: Payer: 59 | Admitting: Physician Assistant

## 2021-06-29 DIAGNOSIS — R0609 Other forms of dyspnea: Secondary | ICD-10-CM

## 2021-06-29 DIAGNOSIS — R7989 Other specified abnormal findings of blood chemistry: Secondary | ICD-10-CM | POA: Diagnosis not present

## 2021-06-29 DIAGNOSIS — R55 Syncope and collapse: Secondary | ICD-10-CM | POA: Diagnosis not present

## 2021-06-29 DIAGNOSIS — J4520 Mild intermittent asthma, uncomplicated: Secondary | ICD-10-CM

## 2021-06-29 DIAGNOSIS — C259 Malignant neoplasm of pancreas, unspecified: Secondary | ICD-10-CM

## 2021-06-29 DIAGNOSIS — C250 Malignant neoplasm of head of pancreas: Secondary | ICD-10-CM | POA: Diagnosis not present

## 2021-06-29 DIAGNOSIS — U071 COVID-19: Secondary | ICD-10-CM | POA: Diagnosis not present

## 2021-06-29 DIAGNOSIS — G620 Drug-induced polyneuropathy: Secondary | ICD-10-CM

## 2021-06-29 DIAGNOSIS — R1013 Epigastric pain: Secondary | ICD-10-CM | POA: Diagnosis not present

## 2021-06-29 DIAGNOSIS — T451X5A Adverse effect of antineoplastic and immunosuppressive drugs, initial encounter: Secondary | ICD-10-CM

## 2021-06-29 LAB — C-REACTIVE PROTEIN: CRP: 9 mg/dL — ABNORMAL HIGH (ref <1.0)

## 2021-06-29 LAB — CBC WITH DIFFERENTIAL/PLATELET
Abs Immature Granulocytes: 0.02 10*3/uL (ref 0.00–0.07)
Basophils Absolute: 0 10*3/uL (ref 0.0–0.1)
Basophils Relative: 0 %
Eosinophils Absolute: 0 10*3/uL (ref 0.0–0.5)
Eosinophils Relative: 0 %
HCT: 31.8 % — ABNORMAL LOW (ref 36.0–46.0)
Hemoglobin: 10.4 g/dL — ABNORMAL LOW (ref 12.0–15.0)
Immature Granulocytes: 1 %
Lymphocytes Relative: 22 %
Lymphs Abs: 0.9 10*3/uL (ref 0.7–4.0)
MCH: 29.8 pg (ref 26.0–34.0)
MCHC: 32.7 g/dL (ref 30.0–36.0)
MCV: 91.1 fL (ref 80.0–100.0)
Monocytes Absolute: 0.3 10*3/uL (ref 0.1–1.0)
Monocytes Relative: 7 %
Neutro Abs: 3 10*3/uL (ref 1.7–7.7)
Neutrophils Relative %: 70 %
Platelets: 125 10*3/uL — ABNORMAL LOW (ref 150–400)
RBC: 3.49 MIL/uL — ABNORMAL LOW (ref 3.87–5.11)
RDW: 14.2 % (ref 11.5–15.5)
WBC: 4.3 10*3/uL (ref 4.0–10.5)
nRBC: 0 % (ref 0.0–0.2)

## 2021-06-29 LAB — PROCALCITONIN: Procalcitonin: 36.41 ng/mL

## 2021-06-29 LAB — ECHOCARDIOGRAM COMPLETE
AR max vel: 2.04 cm2
AV Area VTI: 1.81 cm2
AV Area mean vel: 1.87 cm2
AV Mean grad: 4 mmHg
AV Peak grad: 7.2 mmHg
Ao pk vel: 1.34 m/s
Area-P 1/2: 3.66 cm2
Height: 69 in
MV VTI: 2.05 cm2
S' Lateral: 3.25 cm
Weight: 3200 oz

## 2021-06-29 LAB — HEPATIC FUNCTION PANEL
ALT: 319 U/L — ABNORMAL HIGH (ref 0–44)
AST: 257 U/L — ABNORMAL HIGH (ref 15–41)
Albumin: 3.2 g/dL — ABNORMAL LOW (ref 3.5–5.0)
Alkaline Phosphatase: 120 U/L (ref 38–126)
Bilirubin, Direct: 0.4 mg/dL — ABNORMAL HIGH (ref 0.0–0.2)
Indirect Bilirubin: 0.8 mg/dL (ref 0.3–0.9)
Total Bilirubin: 1.2 mg/dL (ref 0.3–1.2)
Total Protein: 6.5 g/dL (ref 6.5–8.1)

## 2021-06-29 LAB — RENAL FUNCTION PANEL
Albumin: 3.3 g/dL — ABNORMAL LOW (ref 3.5–5.0)
Anion gap: 7 (ref 5–15)
BUN: 12 mg/dL (ref 6–20)
CO2: 24 mmol/L (ref 22–32)
Calcium: 8.5 mg/dL — ABNORMAL LOW (ref 8.9–10.3)
Chloride: 107 mmol/L (ref 98–111)
Creatinine, Ser: 0.97 mg/dL (ref 0.44–1.00)
GFR, Estimated: >60 mL/min (ref >60)
Glucose, Bld: 94 mg/dL (ref 70–99)
Phosphorus: 2.9 mg/dL (ref 2.5–4.6)
Potassium: 3.7 mmol/L (ref 3.5–5.1)
Sodium: 138 mmol/L (ref 135–145)

## 2021-06-29 LAB — CK: Total CK: 70 U/L (ref 38–234)

## 2021-06-29 LAB — PROTIME-INR
INR: 1.2 (ref 0.8–1.2)
Prothrombin Time: 15.1 seconds (ref 11.4–15.2)

## 2021-06-29 LAB — RESP PANEL BY RT-PCR (FLU A&B, COVID) ARPGX2
Influenza A by PCR: NEGATIVE
Influenza B by PCR: NEGATIVE
SARS Coronavirus 2 by RT PCR: POSITIVE — AB

## 2021-06-29 LAB — LACTIC ACID, PLASMA: Lactic Acid, Venous: 0.9 mmol/L (ref 0.5–1.9)

## 2021-06-29 LAB — APTT: aPTT: 28 seconds (ref 24–36)

## 2021-06-29 LAB — BRAIN NATRIURETIC PEPTIDE: B Natriuretic Peptide: 29 pg/mL (ref 0.0–100.0)

## 2021-06-29 MED ORDER — LACTATED RINGERS IV SOLN: INTRAVENOUS | Status: AC

## 2021-06-29 MED ORDER — ALBUTEROL SULFATE HFA 108 (90 BASE) MCG/ACT IN AERS
2.0000 | INHALATION_SPRAY | RESPIRATORY_TRACT | Status: DC | PRN
  Filled 2021-06-29: qty 6.7

## 2021-06-29 MED ORDER — ZINC SULFATE 220 (50 ZN) MG PO CAPS
220.0000 mg | ORAL_CAPSULE | Freq: Every day | ORAL | Status: DC
  Administered 2021-06-29 – 2021-06-30 (×2): 220 mg via ORAL
  Filled 2021-06-29 (×2): qty 1

## 2021-06-29 MED ORDER — OXYCODONE HCL 5 MG PO TABS
10.0000 mg | ORAL_TABLET | Freq: Four times a day (QID) | ORAL | Status: DC | PRN
  Administered 2021-06-29 – 2021-06-30 (×2): 10 mg via ORAL
  Filled 2021-06-29 (×2): qty 2

## 2021-06-29 MED ORDER — ALBUTEROL SULFATE HFA 108 (90 BASE) MCG/ACT IN AERS: 2.0000 | INHALATION_SPRAY | Freq: Four times a day (QID) | RESPIRATORY_TRACT | Status: DC | PRN

## 2021-06-29 MED ORDER — FLUTICASONE PROPIONATE 50 MCG/ACT NA SUSP
2.0000 | Freq: Every day | NASAL | Status: DC
  Filled 2021-06-29 (×2): qty 16

## 2021-06-29 MED ORDER — MAGIC MOUTHWASH W/LIDOCAINE: 10.0000 mL | Freq: Four times a day (QID) | ORAL | Status: DC | PRN

## 2021-06-29 MED ORDER — DICYCLOMINE HCL 10 MG PO CAPS
10.0000 mg | ORAL_CAPSULE | Freq: Three times a day (TID) | ORAL | Status: DC
  Administered 2021-06-29 – 2021-06-30 (×5): 10 mg via ORAL
  Filled 2021-06-29 (×7): qty 1

## 2021-06-29 MED ORDER — MONTELUKAST SODIUM 10 MG PO TABS
10.0000 mg | ORAL_TABLET | Freq: Every day | ORAL | Status: DC
  Administered 2021-06-29 – 2021-06-30 (×2): 10 mg via ORAL
  Filled 2021-06-29 (×2): qty 1

## 2021-06-29 MED ORDER — ASCORBIC ACID 500 MG PO TABS
500.0000 mg | ORAL_TABLET | Freq: Every day | ORAL | Status: DC
  Administered 2021-06-29 – 2021-06-30 (×2): 500 mg via ORAL
  Filled 2021-06-29 (×2): qty 1

## 2021-06-29 MED ORDER — MOLNUPIRAVIR EUA 200MG CAPSULE
4.0000 | ORAL_CAPSULE | Freq: Two times a day (BID) | ORAL | Status: DC
  Administered 2021-06-29 – 2021-06-30 (×3): 800 mg via ORAL
  Filled 2021-06-29: qty 4

## 2021-06-29 MED ORDER — ACETAMINOPHEN 325 MG PO TABS
650.0000 mg | ORAL_TABLET | Freq: Four times a day (QID) | ORAL | Status: DC | PRN
  Administered 2021-06-29: 650 mg via ORAL
  Filled 2021-06-29: qty 2

## 2021-06-29 MED ORDER — OXYCODONE HCL 5 MG PO TABS
10.0000 mg | ORAL_TABLET | Freq: Once | ORAL | Status: AC
  Administered 2021-06-29: 10 mg via ORAL
  Filled 2021-06-29: qty 2

## 2021-06-29 MED ORDER — GABAPENTIN 300 MG PO CAPS: 300.0000 mg | ORAL_CAPSULE | Freq: Three times a day (TID) | ORAL | Status: DC | PRN

## 2021-06-29 MED ORDER — LOPERAMIDE HCL 2 MG PO CAPS
4.0000 mg | ORAL_CAPSULE | Freq: Once | ORAL | Status: AC
  Administered 2021-06-29: 4 mg via ORAL
  Filled 2021-06-29: qty 2

## 2021-06-29 MED ORDER — NIRMATRELVIR/RITONAVIR (PAXLOVID)TABLET: 3.0000 | ORAL_TABLET | Freq: Two times a day (BID) | ORAL | Status: DC

## 2021-06-29 MED ORDER — PROCHLORPERAZINE EDISYLATE 10 MG/2ML IJ SOLN: 10.0000 mg | Freq: Four times a day (QID) | INTRAMUSCULAR | Status: DC | PRN

## 2021-06-29 MED ORDER — MORPHINE SULFATE ER 15 MG PO TBCR
15.0000 mg | EXTENDED_RELEASE_TABLET | Freq: Two times a day (BID) | ORAL | Status: DC
  Administered 2021-06-29 – 2021-06-30 (×3): 15 mg via ORAL
  Filled 2021-06-29 (×3): qty 1

## 2021-06-29 MED ORDER — ONDANSETRON HCL 4 MG PO TABS: 4.0000 mg | ORAL_TABLET | Freq: Four times a day (QID) | ORAL | Status: DC | PRN

## 2021-06-29 MED ORDER — GUAIFENESIN-DM 100-10 MG/5ML PO SYRP: 10.0000 mL | ORAL_SOLUTION | ORAL | Status: DC | PRN

## 2021-06-29 MED ORDER — CHLORHEXIDINE GLUCONATE CLOTH 2 % EX PADS
6.0000 | MEDICATED_PAD | Freq: Every day | CUTANEOUS | Status: DC
  Administered 2021-06-29 – 2021-06-30 (×2): 6 via TOPICAL

## 2021-06-29 MED ORDER — LURASIDONE HCL 40 MG PO TABS
40.0000 mg | ORAL_TABLET | Freq: Every day | ORAL | Status: DC
  Administered 2021-06-29 – 2021-06-30 (×2): 40 mg via ORAL
  Filled 2021-06-29 (×2): qty 1

## 2021-06-29 MED ORDER — POLYETHYLENE GLYCOL 3350 17 G PO PACK: 17.0000 g | PACK | Freq: Every day | ORAL | Status: DC | PRN

## 2021-06-29 MED ORDER — ACETAMINOPHEN 650 MG RE SUPP: 650.0000 mg | Freq: Four times a day (QID) | RECTAL | Status: DC | PRN

## 2021-06-29 MED ORDER — ONDANSETRON HCL 4 MG/2ML IJ SOLN: 4.0000 mg | Freq: Four times a day (QID) | INTRAMUSCULAR | Status: DC | PRN

## 2021-06-29 NOTE — Progress Notes (Signed)
C/o cough, abdominal pain. She has chronic abdominal pain and diarrhea from pancreatic cancer.  No vomiting or shortness of breath.  Blood pressure is better.  Continue current management.  Plan of care discussed with the patient and nurse at the bedside.

## 2021-06-29 NOTE — H&P (Signed)
History and Physical    Robin [name] XBL:[id number] DOB: 1965-10-29 DOA: 06/28/2021  PCP: [name], MD  Patient coming from: Home via EMS   Chief Complaint:  Chief Complaint  Patient presents with   Hypotension   Loss of Consciousness     HPI:  55 year old female with past medical history of asthma, pancreatic cancer with liver metastases (Dx 01/2021 on FOLFIRINOX follows with [name]), S/P biliary stent placement 01/2021, bipolar 1 disorder, chemotherapy induced neuropathy who presents to [facility] emergency department after experiencing an episode of loss of consciousness brought in by EMS.  Patient explains that she was standing in an elevator on 11/2 at a storage facility with her movers that she was moving out of her residence.  While standing for an extended period of time she suddenly lost consciousness and woke up lying on the ground.  Patient has been experiencing generalized weakness and lightheadedness as of late, worse with rising from seated position.  Patient denies any fevers, sick contacts diarrhea or inability to tolerate oral intake.  EMS was contacted and upon initial EMS evaluation patient's blood pressure was found to be 70/40.  Patient was promptly brought into [facility] emergency department for evaluation.  Of note, patient is also been complaining of a 2-week history of abdominal pain.  Patient describes the abdominal pain as epigastric in location waxing and waning in quality, sharp in quality, severe in intensity worse with movement and associated with poor oral intake.  Patient denies nausea vomiting diarrhea sick contacts or recent ingestion of undercooked food.  Upon evaluation in the emergency department patient was found to have low normal blood pressures and therefore was provided with a total of 2 L of normal saline.  Initial work-up revealed markedly elevated transaminases with AST of 505 and ALT of 48 and alkaline phosphatase  of 149 and total bilirubin of 1.4.  CT imaging of the abdomen and pelvis was performed revealing no acute intra-abdominal pathology with common bile duct stent appearing to be unchanged.  ER provider discussed case with [name] with oncology who recommended having a low threshold for admission.  The hhospitalist group was then called to assess the patient for admission to the hospital.   Review of Systems:   Review of Systems  Gastrointestinal:  Positive for abdominal pain.  Neurological:  Positive for loss of consciousness.   Past Medical History:  Diagnosis Date   Asthma    Bipolar disorder ([place])    Diverticulosis    Gestational hypertension    HA (headache)    History of borderline personality disorder    Hypersomnia, persistent 04/29/2013   IBS (irritable bowel syndrome)    Memory loss, short term    [name] 04-03-13    Migraine    Pancreatic cancer ([place])    Reflux esophagitis    Seasonal allergies     Past Surgical History:  Procedure Laterality Date   ABDOMINAL HYSTERECTOMY  2000   BILIARY BRUSHING  01/13/2021   Procedure: BILIARY BRUSHING;  Surgeon: Gatha Mayer, MD;  Location: [facility];  Service: Endoscopy;;   BILIARY DILATION  02/09/2021   Procedure: BILIARY DILATION;  Surgeon: Irving Copas., MD;  Location: [place];  Service: Gastroenterology;;   BILIARY STENT PLACEMENT N/A 01/13/2021   Procedure: BILIARY STENT PLACEMENT;  Surgeon: Gatha Mayer, MD;  [facility];  Service: Endoscopy;  Laterality: N/A;   BILIARY STENT PLACEMENT  02/09/2021   Procedure: BILIARY STENT  PLACEMENT;  Surgeon: Rush Landmark Telford Nab., MD;  Location: [place];  Service: Gastroenterology;;   BIOPSY  01/13/2021   Procedure: BIOPSY;  Surgeon: Gatha Mayer, MD;  Location[facility];  Service: Endoscopy;;   BIOPSY  02/09/2021   Procedure: BIOPSY;  Surgeon: Irving Copas., MD;  Location: [place];  Service: Gastroenterology;;    COLONOSCOPY  01/2020   ERCP N/A 01/13/2021   Procedure: ENDOSCOPIC RETROGRADE CHOLANGIOPANCREATOGRAPHY (ERCP);  Surgeon: Gatha Mayer, MD;  Location: [facility];  Service: Endoscopy;  Laterality: N/A;   ERCP N/A 02/09/2021   Procedure: ENDOSCOPIC RETROGRADE CHOLANGIOPANCREATOGRAPHY (ERCP);  Surgeon: Irving Copas., MD;  Location: [place];  Service: Gastroenterology;  Laterality: N/A;   ESOPHAGOGASTRODUODENOSCOPY (EGD) WITH PROPOFOL N/A 02/09/2021   Procedure: ESOPHAGOGASTRODUODENOSCOPY (EGD) WITH PROPOFOL;  Surgeon: Rush Landmark Telford Nab., MD;  Location: [place];  Service: Gastroenterology;  Laterality: N/A;   EUS N/A 02/09/2021   Procedure: UPPER ENDOSCOPIC ULTRASOUND (EUS) LINEAR;  Surgeon: Irving Copas., MD;  Location: [place];  Service: Gastroenterology;  Laterality: N/A;   FINE NEEDLE ASPIRATION  02/09/2021   Procedure: FINE NEEDLE ASPIRATION (FNA) LINEAR;  Surgeon: Irving Copas., MD;  Location: [place];  Service: Gastroenterology;;   KNEE SURGERY Right 96,98,00,02,05,2014   NASAL SINUS SURGERY     NOSE SURGERY     [phone]   PORTACATH PLACEMENT Right 02/17/2021   Procedure: INSERTION PORT-A-CATH;  Surgeon: Dwan Bolt, MD;  Location: [facility];  Service: General;  Laterality: Right;  LMA ANESTHESIA   REPLACEMENT TOTAL KNEE Right    SPHINCTEROTOMY  01/13/2021   Procedure: SPHINCTEROTOMY;  Surgeon: Gatha Mayer, MD;  [facility];  Service: Endoscopy;;   STENT REMOVAL  02/09/2021   Procedure: Lavell Islam REMOVAL;  Surgeon: Irving Copas., MD;  Location: [place];  Service: Gastroenterology;;   WRIST SURGERY Right [phone]     reports that she has never smoked. She has never used smokeless tobacco. She reports that she does not drink alcohol and does not use drugs.  Allergies  Allergen Reactions   Aspirin Nausea And Vomiting and Rash   Bee Venom Anaphylaxis   Penicillin G Anaphylaxis   Penicillins  Anaphylaxis   Bactrim [Sulfamethoxazole-Trimethoprim] Nausea And Vomiting   Coconut Oil Other (See Comments)    Caused wheezing   Sulfa Antibiotics Nausea And Vomiting   Sulfamethoxazole-Trimethoprim Nausea And Vomiting    Family History  Problem Relation Age of Onset   Depression Mother    High Cholesterol Mother    Hypertension Mother    Heart failure Mother    Hypertension Father    Cancer Father        ? prostate or colon; dx after 31   Schizophrenia Brother    Breast cancer Maternal Grandmother        dx after 50   Diabetes Maternal Grandmother    High blood pressure Maternal Grandmother    Heart disease Maternal Grandmother    High blood pressure Maternal Grandfather    Heart attack Maternal Grandfather    Breast cancer Half-Sister 5   Breast cancer Half-Sister        paternal half sister; dx late 73s-early 60s   Breast cancer Maternal Great-grandmother        MGM's mother; dx after 35   Colon cancer Neg Hx    Esophageal cancer Neg Hx    Rectal cancer Neg Hx    Stomach cancer Neg Hx      Prior to Admission medications  Medication Sig Start Date End Date Taking? Authorizing Provider  acetaminophen (TYLENOL) 325 MG tablet Take 325-650 mg by mouth every 6 (six) hours as needed for mild pain or headache.    [provider]  albuterol (VENTOLIN HFA) 108 (90 Base) MCG/ACT inhaler Inhale 2 puffs into the lungs every 6 (six) hours as needed for wheezing or shortness of breath.    [provider]  cyclobenzaprine (FLEXERIL) 5 MG tablet TAKE 1 TABLET(5 MG) BY MOUTH AT BEDTIME AS NEEDED FOR LOWER BACK PAIN OR SEVERE HEADACHE Patient taking differently: Take 5 mg by mouth at bedtime as needed (for lower back pain or severe headaches). 08/29/20   Narda Amber K, DO  dicyclomine (BENTYL) 10 MG capsule Take 1 capsule (10 mg total) by mouth 4 (four) times daily -  before meals and at bedtime. 05/11/21   Alla Feeling, NP  diphenoxylate-atropine (LOMOTIL)  2.5-0.025 MG tablet Take 1-2 tabs by mouth 4 times a day as needed for loose stools. 03/10/21   Truitt Merle, MD  EPINEPHrine 0.3 mg/0.3 mL IJ SOAJ injection Inject 0.3 mg into the muscle as needed for anaphylaxis. INJECT 0.3 MLS (0.3 MG TOTAL) INTO THE MUSCLE ONCE Patient taking differently: Inject 0.3 mg into the muscle once as needed for anaphylaxis. 12/14/20   [name], MD  fluticasone [facility]) 50 MCG/ACT nasal spray Place 2 sprays into both nostrils daily.    [provider]  gabapentin (NEURONTIN) 300 MG capsule Take 300 mg by mouth 3 (three) times daily as needed (for neuropathic pain). 12/13/20   [provider]  LATUDA 40 MG TABS tablet Take 1 tablet (40 mg total) by mouth daily with breakfast. 10/10/17   Dara Hoyer, PA-C  lidocaine-prilocaine (EMLA) cream Apply to affected area once Patient taking differently: Apply 1 application topically as directed. 02/14/21   Truitt Merle, MD  magic mouthwash w/lidocaine SOLN Take 10 mLs by mouth 4 (four) times daily as needed for mouth pain. 10 mls swish and spit by mouth four times daily as needed 04/27/21   Alla Feeling, NP  montelukast (SINGULAIR) 10 MG tablet Take 10 mg by mouth daily.    [provider]  morphine (MS CONTIN) 15 MG 12 hr tablet Take 1 tablet (15 mg total) by mouth every 12 (twelve) hours. 06/17/21   Truitt Merle, MD  ondansetron (ZOFRAN) 8 MG tablet Take 1 tablet (8 mg total) by mouth every 8 (eight) hours as needed for nausea or vomiting. Starting on day 3 after chemotherapy 02/13/21   Heilingoetter, Cassandra L, PA-C  Oxycodone HCl 10 MG TABS Take 1 tablet (10 mg total) by mouth every 6 (six) hours as needed. 06/17/21   Truitt Merle, MD  PRESCRIPTION MEDICATION Inject into the vein See admin instructions. FOLFIRINOX- Every 14 days for 8 treatments    [provider]  prochlorperazine (COMPAZINE) 10 MG tablet Take 1 tablet (10 mg total) by mouth every 6 (six) hours as needed for vomiting or nausea.  05/25/21   Truitt Merle, MD  promethazine (PHENERGAN) 25 MG tablet Take 25 mg by mouth every 6 (six) hours as needed for vomiting or nausea. 02/08/21   [provider]  Pyridoxine HCl (VITAMIN B6 PO) Take by mouth.    [provider]    Physical Exam: Vitals:   06/29/21 0615 06/29/21 0630 06/29/21 0645 06/29/21 0843  BP: (!) 99/50  112/63 122/69  Pulse: 88  81 99  Resp:  15  16  Temp:  TempSrc:      SpO2: 99%  100% 97%  Weight:      Height:        Constitutional: Awake alert and oriented x3, no associated distress.   Skin: no rashes, no lesions, good skin turgor noted. Eyes: Pupils are equally reactive to light.  No evidence of scleral icterus or conjunctival pallor.  ENMT: Moist mucous membranes noted.  Posterior pharynx clear of any exudate or lesions.   Neck: normal, supple, no masses, no thyromegaly.  No evidence of jugular venous distension.   Respiratory: clear to auscultation bilaterally, no wheezing, no crackles. Normal respiratory effort. No accessory muscle use.  Cardiovascular: Regular rate and rhythm, no murmurs / rubs / gallops. No extremity edema. 2+ pedal pulses. No carotid bruits.  Chest:   Nontender without crepitus or deformity.   Back:   Nontender without crepitus or deformity. Abdomen: Abdomen is extremely tender in the epigastric region.  Abdomen is soft.  No evidence of intra-abdominal masses.  Positive bowel sounds noted in all quadrants.   Musculoskeletal: No joint deformity upper and lower extremities. Good ROM, no contractures. Normal muscle tone.  Neurologic: CN 2-12 grossly intact. Sensation intact.  Patient moving all 4 extremities spontaneously.  Patient is following all commands.  Patient is responsive to verbal stimuli.   Psychiatric: Patient exhibits normal mood with appropriate affect.  Patient seems to possess insight as to their current situation.     Labs on Admission: I have personally reviewed following labs and imaging studies  -   CBC: Recent Labs  Lab 06/28/21 2010 06/29/21 0655  WBC 3.2* 4.3  NEUTROABS 2.4 3.0  HGB 11.4* 10.4*  HCT 35.6* 31.8*  MCV 90.4 91.1  PLT 127* 818*   Basic Metabolic Panel: Recent Labs  Lab 06/28/21 2010 06/29/21 0655  NA 134* 138  K 3.7 3.7  CL 103 107  CO2 23 24  GLUCOSE 122* 94  BUN 14 12  CREATININE 1.15* 0.97  CALCIUM 8.5* 8.5*  PHOS  --  2.9   GFR: Estimated Creatinine Clearance: 77.7 mL/min (by C-G formula based on SCr of 0.97 mg/dL). Liver Function Tests: Recent Labs  Lab 06/28/21 2010 06/29/21 0655  AST 505* 257*  ALT 458* 319*  ALKPHOS 149* 120  BILITOT 1.4* 1.2  PROT 7.0 6.5  ALBUMIN 3.8 3.2*  3.3*   Recent Labs  Lab 06/28/21 2010  LIPASE 22   No results for input(s): AMMONIA in the last 168 hours. Coagulation Profile: Recent Labs  Lab 06/29/21 0655  INR 1.2   Cardiac Enzymes: Recent Labs  Lab 06/29/21 0655  CKTOTAL 70   BNP (last 3 results) No results for input(s): PROBNP in the last 8760 hours. HbA1C: No results for input(s): HGBA1C in the last 72 hours. CBG: Recent Labs  Lab 06/28/21 2023  GLUCAP 112*   Lipid Profile: No results for input(s): CHOL, HDL, LDLCALC, TRIG, CHOLHDL, LDLDIRECT in the last 72 hours. Thyroid Function Tests: No results for input(s): TSH, T4TOTAL, FREET4, T3FREE, THYROIDAB in the last 72 hours. Anemia Panel: No results for input(s): VITAMINB12, FOLATE, FERRITIN, TIBC, IRON, RETICCTPCT in the last 72 hours. Urine analysis:    Component Value Date/Time   COLORURINE YELLOW 06/28/2021 2205   APPEARANCEUR CLEAR 06/28/2021 2205   LABSPEC 1.013 06/28/2021 2205   PHURINE 6.0 06/28/2021 2205   GLUCOSEU NEGATIVE 06/28/2021 2205   HGBUR SMALL (A) 06/28/2021 2205   BILIRUBINUR NEGATIVE 06/28/2021 2205   KETONESUR NEGATIVE 06/28/2021 2205   PROTEINUR NEGATIVE  06/28/2021 2205   UROBILINOGEN 1.0 02/07/2015 2317   NITRITE NEGATIVE 06/28/2021 2205   LEUKOCYTESUR NEGATIVE 06/28/2021 2205     Radiological Exams on Admission - Personally Reviewed: DG Chest 1 View  Result Date: 06/29/2021 CLINICAL DATA:  Syncope EXAM: CHEST  1 VIEW COMPARISON:  Chest x-ray 02/17/2021 FINDINGS: Heart size and mediastinal contours are within normal limits. No suspicious pulmonary opacities identified. Right-sided central venous port with the tip in the SVC. No pleural effusion or pneumothorax visualized. No acute osseous abnormality appreciated. IMPRESSION: No acute intrathoracic process identified. Electronically Signed   By: Ofilia Neas M.D.   On: 06/29/2021 07:30   CT ABDOMEN PELVIS W CONTRAST  Result Date: 06/28/2021 CLINICAL DATA:  Abdominal pain, nausea, vomiting EXAM: CT ABDOMEN AND PELVIS WITH CONTRAST TECHNIQUE: Multidetector CT imaging of the abdomen and pelvis was performed using the standard protocol following bolus administration of intravenous contrast. CONTRAST:  73mL OMNIPAQUE IOHEXOL 350 MG/ML SOLN COMPARISON:  06/23/2021 FINDINGS: Lower chest: The visualized lung bases are clear. Visualized heart and pericardium are unremarkable. Asymmetric wall thickening within the tiny hiatal hernia appears stable since prior PET CT examination and was metabolically negative at that time. Hepatobiliary: Hemangioma within the right hepatic dome is unchanged. Multiple scattered hepatic cysts are again identified. Suspected metastatic lesion within the subserosal inferior right hepatic lobe is not well appreciated on this examination. Pneumobilia again noted. No intra or extrahepatic biliary ductal dilation. Palliative biliary stent noted within the extrahepatic bile duct. The gallbladder is unremarkable. Pancreas: Incomplete pancreatic divisum noted. The fullness of the pancreatic head previously noted is stable. Mild hypodensity surrounding the palliative biliary stent within the pancreatic head is nonspecific but appears new since prior examination, possibly representing the patient's primary mass or  peribiliary edema. The pancreatic duct is not dilated. The remainder of the pancreatic parenchyma demonstrates normal enhancement. Spleen: Unremarkable Adrenals/Urinary Tract: Adrenal glands are unremarkable. Kidneys are normal, without renal calculi, focal lesion, or hydronephrosis. Bladder is unremarkable. Stomach/Bowel: The stomach, large bowel, and small bowel are unremarkable. Appendix normal. No free intraperitoneal gas or fluid. Vascular/Lymphatic: Aortic atherosclerosis. No enlarged abdominal or pelvic lymph nodes. Reproductive: Status post hysterectomy. No adnexal masses. Other: No abdominal wall hernia. Musculoskeletal: No acute bone abnormality. No lytic or blastic bone lesion. IMPRESSION: No acute intra-abdominal pathology identified. No definite radiographic explanation for the patient's reported symptoms. Palliative biliary stent unchanged within the distal common duct. Interval development of mild peribiliary hypoattenuation within the pancreatic head which may represent the patient's primary mass or peribiliary edema, but is nonspecific. Known hepatic metastasis is not well visualized on this examination. Aortic Atherosclerosis (ICD10-I70.0). Electronically Signed   By: Fidela Salisbury M.D.   On: 06/28/2021 22:48   US Abdomen Limited  Result Date: 06/29/2021 CLINICAL DATA:  Elevated LFTs, pancreatic cancer, biliary stent in place EXAM: ULTRASOUND ABDOMEN LIMITED RIGHT UPPER QUADRANT COMPARISON:  CT abdomen/pelvis dated 06/28/2021 FINDINGS: Gallbladder: Mild gallbladder wall thickening/edema. No cholelithiasis, gallbladder distention, or pericholecystic fluid. Negative sonographic Murphy's sign. Common bile duct: Diameter: 3 mm.  Indwelling common duct stent. Liver: 2.5 x 2.43.5 cm hyperechoic lesion in the right hepatic lobe, likely corresponding to the patient's known benign hemangioma. 10 x 9 x 10 mm cyst in the right hepatic lobe. Additional lesions are better evaluated on prior CTs.  Pneumobilia. Portal vein is patent on color Doppler imaging with normal direction of blood flow towards the liver. Other: None. IMPRESSION: Indwelling common duct stent. Benign hemangioma and hepatic cyst. Additional hepatic lesions are  better evaluated on prior CTs. Mild gallbladder wall thickening/edema, without sonographic findings to suggest acute cholecystitis. Electronically Signed   By: Julian Hy M.D.   On: 06/29/2021 01:08    EKG: Personally reviewed.  Rhythm is normal sinus rhythm with heart rate of 99 bpm.  No dynamic ST segment changes appreciated.  Assessment/Plan  * Syncope Possible orthostatic syncope due to poor oral intake Monitoring patient on telemetry Obtaining echocardiogram Obtaining orthostatic vital signs  Epigastric pain Patient presenting with 2-week history of substantial epigastric pain in setting of an indwelling common bile duct stent concurrent elevated transaminases, alkaline phosphatase and bilirubin CT imaging of the abdomen reveals no evidence of stent failure Providing patient with as needed opiate-based analgesics for associated pain Case discussed with [facility] who will evaluate patient today in consultation Patient currently n.p.o. in case of intervention Hydrating patient with intravenous isotonic fluids  Abnormal LFTs Please see assessment and plan above  Pancreatic cancer ([place]) Diagnosed 01/2021 with pancreatic cancer with metastases to the liver Currently receiving chemotherapy and following with [name] in the outpatient setting Status post common bile duct stent 01/2021 by Dr. Rush Landmark Continue outpatient follow-up  COVID-19 virus infection Incidental finding Chest x-ray unremarkable Patient on room air Patient complains of a 1 day history of dry nonproductive cough In an effort to prevent progression of COVID related illness in this patient with active malignancy will initiate antiviral therapy  Per my  discussion with pharmacy, Molnupirivir is recommended over backslid and therefore this will be ordered Contact and airborne isolation Zinc and vitamin C supplementation As needed bronchodilator therapy for wheezing As needed antitussives for cough  Chemotherapy-induced neuropathy ([place]) Continue home regimen of Neurontin  Mild intermittent asthma without complication No evidence of acute asthma exacerbation As needed bronchodilator therapy for shortness of breath and wheezing.       Code Status:  Full code  code status decision has been confirmed with: patient Family Communication: Discussed plan of care with spouse via phone conversation  Status is: Observation  The patient remains OBS appropriate and will d/c before 2 midnights.       Vernelle Emerald MD [facility] Pager [phone]  If 7PM-7AM, please contact night-coverage [email or web address] Use universal [place] password for that web site. If you do not have the password, please call the hospital operator.  06/29/2021, 11:15 AM

## 2021-06-29 NOTE — Assessment & Plan Note (Signed)
·   Please see assessment and plan above °

## 2021-06-29 NOTE — Consult Note (Addendum)
Referring Provider: Dr. Cyd Silence, [facility] Primary Care Physician:  [name], MD Primary Gastroenterologist:  Dr. Fuller Plan  Reason for Consultation:  Pancreatic cancer, elevated LFTs  HPI: CARIME DINKEL is a 55 y.o. female of asthma, pancreatic cancer with liver metastases (Dx 01/2021 on FOLFIRINOX follows with [name]), S/P biliary stent placement 01/2021, bipolar 1 disorder, chemotherapy induced neuropathy who presented to [facility] emergency department after experiencing an episode of loss of consciousness brought in by EMS.  She reports that she had been standing for an extended period of time and had been feeling weak and lightheaded.  Is moving out of her residence so was standing in an elevator with her movers when she had an episode of syncope/LOC.  Initial BP was 70/40.  Has been having abdominal pain, mostly in the epigastrium and LUQ.  No nausea, vomiting.  Initial work-up revealed markedly elevated transaminases with AST of 505 and ALT of 48 and alkaline phosphatase of 149 and total bilirubin of 1.4.  CT of the abdomen and pelvis with contrast showed the following:  IMPRESSION: No acute intra-abdominal pathology identified. No definite radiographic explanation for the patient's reported symptoms.   Palliative biliary stent unchanged within the distal common duct.  Interval development of mild peribiliary hypoattenuation within the pancreatic head which may represent the patient's primary mass or peribiliary edema, but is nonspecific. Known hepatic metastasis is not well visualized on this examination.   Aortic Atherosclerosis (ICD10-I70.0).  Ultrasound showed the following:  IMPRESSION: Indwelling common duct stent.   Benign hemangioma and hepatic cyst. Additional hepatic lesions are better evaluated on prior CTs.   Mild gallbladder wall thickening/edema, without sonographic findings to suggest acute cholecystitis.  Repeat LFTs this morning were improved  with AST 257, ALT 319, ALP 120, and total bili 1.2.  Also found to be Covid positive.   Past Medical History:  Diagnosis Date  . Asthma   . Bipolar disorder ([place])   . Diverticulosis   . Gestational hypertension   . HA (headache)   . History of borderline personality disorder   . Hypersomnia, persistent 04/29/2013  . IBS (irritable bowel syndrome)   . Memory loss, short term    [name] 04-03-13   . Migraine   . Pancreatic cancer ([place])   . Reflux esophagitis   . Seasonal allergies     Past Surgical History:  Procedure Laterality Date  . ABDOMINAL HYSTERECTOMY  2000  . BILIARY BRUSHING  01/13/2021   Procedure: BILIARY BRUSHING;  Surgeon: Gatha Mayer, MD;  Location: [facility];  Service: Endoscopy;;  . BILIARY DILATION  02/09/2021   Procedure: BILIARY DILATION;  Surgeon: Irving Copas., MD;  Location: [place];  Service: Gastroenterology;;  . BILIARY STENT PLACEMENT N/A 01/13/2021   Procedure: BILIARY STENT PLACEMENT;  Surgeon: Gatha Mayer, MD;  [facility];  Service: Endoscopy;  Laterality: N/A;  . BILIARY STENT PLACEMENT  02/09/2021   Procedure: BILIARY STENT PLACEMENT;  Surgeon: Irving Copas., MD;  Location: [place];  Service: Gastroenterology;;  . BIOPSY  01/13/2021   Procedure: BIOPSY;  Surgeon: Gatha Mayer, MD;  [facility]: [facility];  Service: Endoscopy;;  . BIOPSY  02/09/2021   Procedure: BIOPSY;  Surgeon: Irving Copas., MD;  Location: [place];  Service: Gastroenterology;;  . COLONOSCOPY  01/2020  . ERCP N/A 01/13/2021   Procedure: ENDOSCOPIC RETROGRADE CHOLANGIOPANCREATOGRAPHY (ERCP);  Surgeon: Gatha Mayer, MD;  Location[facility];  Service: Endoscopy;  Laterality: N/A;  .  ERCP N/A 02/09/2021   Procedure: ENDOSCOPIC RETROGRADE CHOLANGIOPANCREATOGRAPHY (ERCP);  Surgeon: Irving Copas., MD;  Location: [place];  Service: Gastroenterology;  Laterality: N/A;  .  ESOPHAGOGASTRODUODENOSCOPY (EGD) WITH PROPOFOL N/A 02/09/2021   Procedure: ESOPHAGOGASTRODUODENOSCOPY (EGD) WITH PROPOFOL;  Surgeon: Rush Landmark Telford Nab., MD;  Location: [place];  Service: Gastroenterology;  Laterality: N/A;  . EUS N/A 02/09/2021   Procedure: UPPER ENDOSCOPIC ULTRASOUND (EUS) LINEAR;  Surgeon: Irving Copas., MD;  Location: [place];  Service: Gastroenterology;  Laterality: N/A;  . FINE NEEDLE ASPIRATION  02/09/2021   Procedure: FINE NEEDLE ASPIRATION (FNA) LINEAR;  Surgeon: Irving Copas., MD;  Location: [place];  Service: Gastroenterology;;  . KNEE SURGERY Right 96,98,00,02,05,2014  . NASAL SINUS SURGERY    . NOSE SURGERY     [id number],[id number]  . PORTACATH PLACEMENT Right 02/17/2021   Procedure: INSERTION PORT-A-CATH;  Surgeon: Dwan Bolt, MD;  Location: [facility];  Service: General;  Laterality: Right;  LMA ANESTHESIA  . REPLACEMENT TOTAL KNEE Right   . SPHINCTEROTOMY  01/13/2021   Procedure: SPHINCTEROTOMY;  Surgeon: Gatha Mayer, MD;  Location[facility];  Service: Endoscopy;;  . Lavell Islam REMOVAL  02/09/2021   Procedure: STENT REMOVAL;  Surgeon: Irving Copas., MD;  Location: [place];  Service: Gastroenterology;;  . WRIST SURGERY Right [phone]    Prior to Admission medications   Medication Sig Start Date End Date Taking? Authorizing Provider  acetaminophen (TYLENOL) 325 MG tablet Take 325-650 mg by mouth every 6 (six) hours as needed for mild pain or headache.   Yes [provider]  albuterol (VENTOLIN HFA) 108 (90 Base) MCG/ACT inhaler Inhale 2 puffs into the lungs every 6 (six) hours as needed for wheezing or shortness of breath.   Yes [provider]  cyclobenzaprine (FLEXERIL) 5 MG tablet TAKE 1 TABLET(5 MG) BY MOUTH AT BEDTIME AS NEEDED FOR LOWER BACK PAIN OR SEVERE HEADACHE Patient taking differently: Take 5 mg by mouth at bedtime as needed (for lower back pain or severe headaches). 08/29/20  Yes  Patel, Donika K, DO  dicyclomine (BENTYL) 10 MG capsule Take 1 capsule (10 mg total) by mouth 4 (four) times daily -  before meals and at bedtime. 05/11/21  Yes Alla Feeling, NP  diphenoxylate-atropine (LOMOTIL) 2.5-0.025 MG tablet Take 1-2 tabs by mouth 4 times a day as needed for loose stools. 03/10/21  Yes Truitt Merle, MD  EPINEPHrine 0.3 mg/0.3 mL IJ SOAJ injection Inject 0.3 mg into the muscle as needed for anaphylaxis. INJECT 0.3 MLS (0.3 MG TOTAL) INTO THE MUSCLE ONCE Patient taking differently: Inject 0.3 mg into the muscle once as needed for anaphylaxis. 12/14/20  Yes [name], MD  fluticasone [facility]) 50 MCG/ACT nasal spray Place 2 sprays into both nostrils daily.   Yes [provider]  gabapentin (NEURONTIN) 300 MG capsule Take 300 mg by mouth 3 (three) times daily as needed (for neuropathic pain). 12/13/20  Yes [provider]  LATUDA 40 MG TABS tablet Take 1 tablet (40 mg total) by mouth daily with breakfast. 10/10/17  Yes Dara Hoyer, PA-C  lidocaine-prilocaine (EMLA) cream Apply to affected area once Patient taking differently: Apply 1 application topically as directed. 02/14/21  Yes Truitt Merle, MD  magic mouthwash w/lidocaine SOLN Take 10 mLs by mouth 4 (four) times daily as needed for mouth pain. 10 mls swish and spit by mouth four times daily as needed 04/27/21  Yes Alla Feeling, NP  montelukast (SINGULAIR) 10 MG tablet  Take 10 mg by mouth daily.   Yes [provider]  morphine (MS CONTIN) 15 MG 12 hr tablet Take 1 tablet (15 mg total) by mouth every 12 (twelve) hours. 06/17/21  Yes Truitt Merle, MD  ondansetron (ZOFRAN) 8 MG tablet Take 1 tablet (8 mg total) by mouth every 8 (eight) hours as needed for nausea or vomiting. Starting on day 3 after chemotherapy 02/13/21  Yes Heilingoetter, Cassandra L, PA-C  Oxycodone HCl 10 MG TABS Take 1 tablet (10 mg total) by mouth every 6 (six) hours as needed. 06/17/21  Yes Truitt Merle, MD  PRESCRIPTION MEDICATION  Inject into the vein See admin instructions. FOLFIRINOX- Every 14 days for 8 treatments   Yes [provider]  prochlorperazine (COMPAZINE) 10 MG tablet Take 1 tablet (10 mg total) by mouth every 6 (six) hours as needed for vomiting or nausea. 05/25/21  Yes Truitt Merle, MD  promethazine (PHENERGAN) 25 MG tablet Take 25 mg by mouth every 6 (six) hours as needed for vomiting or nausea. 02/08/21  Yes [provider]  Pyridoxine HCl (VITAMIN B6 PO) Take by mouth.   Yes [provider]    Current Facility-Administered Medications  Medication Dose Route Frequency Provider Last Rate Last Admin  . acetaminophen (TYLENOL) tablet 650 mg  650 mg Oral Q6H PRN Shalhoub, Sherryll Burger, MD       Or  . acetaminophen (TYLENOL) suppository 650 mg  650 mg Rectal Q6H PRN Shalhoub, Sherryll Burger, MD      . albuterol (VENTOLIN HFA) 108 (90 Base) MCG/ACT inhaler 2 puff  2 puff Inhalation Q4H PRN Shalhoub, Sherryll Burger, MD      . ascorbic acid (VITAMIN C) tablet 500 mg  500 mg Oral Daily Shalhoub, Sherryll Burger, MD   500 mg at 06/29/21 0906  . dicyclomine (BENTYL) capsule 10 mg  10 mg Oral TID AC & HS Shalhoub, Sherryll Burger, MD   10 mg at 06/29/21 0907  . fluticasone (FLONASE) 50 MCG/ACT nasal spray 2 spray  2 spray Each Nare Daily Shalhoub, Sherryll Burger, MD      . gabapentin (NEURONTIN) capsule 300 mg  300 mg Oral TID PRN Vernelle Emerald, MD      . guaiFENesin-dextromethorphan (ROBITUSSIN DM) 100-10 MG/5ML syrup 10 mL  10 mL Oral Q4H PRN Shalhoub, Sherryll Burger, MD      . lactated ringers infusion   Intravenous Continuous Cyd Silence, Sherryll Burger, MD 100 mL/hr at 06/29/21 0656 New Bag at 06/29/21 0656  . loperamide (IMODIUM) capsule 4 mg  4 mg Oral Once Vernelle Emerald, MD      . lurasidone (LATUDA) tablet 40 mg  40 mg Oral Q breakfast Shalhoub, Sherryll Burger, MD   40 mg at 06/29/21 0906  . magic mouthwash w/lidocaine  10 mL Oral QID PRN Shalhoub, Sherryll Burger, MD      . molnupiravir EUA (LAGEVRIO) capsule 800 mg  4 capsule Oral BID  Vernelle Emerald, MD      . montelukast (SINGULAIR) tablet 10 mg  10 mg Oral Daily Shalhoub, Sherryll Burger, MD   10 mg at 06/29/21 0907  . morphine (MS CONTIN) 12 hr tablet 15 mg  15 mg Oral Q12H Shalhoub, Sherryll Burger, MD   15 mg at 06/29/21 0907  . ondansetron (ZOFRAN) tablet 4 mg  4 mg Oral Q6H PRN Shalhoub, Sherryll Burger, MD       Or  . ondansetron (ZOFRAN) injection 4 mg  4 mg Intravenous Q6H PRN Shalhoub,  Sherryll Burger, MD      . oxyCODONE (Oxy IR/ROXICODONE) immediate release tablet 10 mg  10 mg Oral Q6H PRN Shalhoub, Sherryll Burger, MD      . polyethylene glycol (MIRALAX / GLYCOLAX) packet 17 g  17 g Oral Daily PRN Shalhoub, Sherryll Burger, MD      . zinc sulfate capsule 220 mg  220 mg Oral Daily Shalhoub, Sherryll Burger, MD   220 mg at 06/29/21 1610   Current Outpatient Medications  Medication Sig Dispense Refill  . acetaminophen (TYLENOL) 325 MG tablet Take 325-650 mg by mouth every 6 (six) hours as needed for mild pain or headache.    . albuterol (VENTOLIN HFA) 108 (90 Base) MCG/ACT inhaler Inhale 2 puffs into the lungs every 6 (six) hours as needed for wheezing or shortness of breath.    . cyclobenzaprine (FLEXERIL) 5 MG tablet TAKE 1 TABLET(5 MG) BY MOUTH AT BEDTIME AS NEEDED FOR LOWER BACK PAIN OR SEVERE HEADACHE (Patient taking differently: Take 5 mg by mouth at bedtime as needed (for lower back pain or severe headaches).) 30 tablet 0  . dicyclomine (BENTYL) 10 MG capsule Take 1 capsule (10 mg total) by mouth 4 (four) times daily -  before meals and at bedtime. 30 capsule 1  . diphenoxylate-atropine (LOMOTIL) 2.5-0.025 MG tablet Take 1-2 tabs by mouth 4 times a day as needed for loose stools. 30 tablet 0  . EPINEPHrine 0.3 mg/0.3 mL IJ SOAJ injection Inject 0.3 mg into the muscle as needed for anaphylaxis. INJECT 0.3 MLS (0.3 MG TOTAL) INTO THE MUSCLE ONCE (Patient taking differently: Inject 0.3 mg into the muscle once as needed for anaphylaxis.) 1 each 0  . fluticasone (FLONASE) 50 MCG/ACT nasal spray Place 2 sprays  into both nostrils daily.    Marland Kitchen gabapentin (NEURONTIN) 300 MG capsule Take 300 mg by mouth 3 (three) times daily as needed (for neuropathic pain).    Marland Kitchen LATUDA 40 MG TABS tablet Take 1 tablet (40 mg total) by mouth daily with breakfast. 90 tablet 0  . lidocaine-prilocaine (EMLA) cream Apply to affected area once (Patient taking differently: Apply 1 application topically as directed.) 30 g 3  . magic mouthwash w/lidocaine SOLN Take 10 mLs by mouth 4 (four) times daily as needed for mouth pain. 10 mls swish and spit by mouth four times daily as needed 470 mL 2  . montelukast (SINGULAIR) 10 MG tablet Take 10 mg by mouth daily.    Marland Kitchen morphine (MS CONTIN) 15 MG 12 hr tablet Take 1 tablet (15 mg total) by mouth every 12 (twelve) hours. 60 tablet 0  . ondansetron (ZOFRAN) 8 MG tablet Take 1 tablet (8 mg total) by mouth every 8 (eight) hours as needed for nausea or vomiting. Starting on day 3 after chemotherapy 30 tablet 2  . Oxycodone HCl 10 MG TABS Take 1 tablet (10 mg total) by mouth every 6 (six) hours as needed. 90 tablet 0  . PRESCRIPTION MEDICATION Inject into the vein See admin instructions. FOLFIRINOX- Every 14 days for 8 treatments    . prochlorperazine (COMPAZINE) 10 MG tablet Take 1 tablet (10 mg total) by mouth every 6 (six) hours as needed for vomiting or nausea. 60 tablet 2  . promethazine (PHENERGAN) 25 MG tablet Take 25 mg by mouth every 6 (six) hours as needed for vomiting or nausea.    . Pyridoxine HCl (VITAMIN B6 PO) Take by mouth.      Allergies as of 06/28/2021 - Review Complete 06/28/2021  Allergen Reaction Noted  . Aspirin Nausea And Vomiting and Rash 04/02/2013  . Bee venom Anaphylaxis 11/23/2014  . Penicillin g Anaphylaxis 04/05/2021  . Penicillins Anaphylaxis 04/02/2013  . Bactrim [sulfamethoxazole-trimethoprim] Nausea And Vomiting 04/02/2013  . Coconut oil Other (See Comments) 04/05/2021  . Sulfa antibiotics Nausea And Vomiting 11/23/2014  . Sulfamethoxazole-trimethoprim  Nausea And Vomiting 04/05/2021    Family History  Problem Relation Age of Onset  . Depression Mother   . High Cholesterol Mother   . Hypertension Mother   . Heart failure Mother   . Hypertension Father   . Cancer Father        ? prostate or colon; dx after 15  . Schizophrenia Brother   . Breast cancer Maternal Grandmother        dx after 50  . Diabetes Maternal Grandmother   . High blood pressure Maternal Grandmother   . Heart disease Maternal Grandmother   . High blood pressure Maternal Grandfather   . Heart attack Maternal Grandfather   . Breast cancer Half-Sister 69  . Breast cancer Half-Sister        paternal half sister; dx late 78s-early 60s  . Breast cancer Maternal Great-grandmother        MGM's mother; dx after 71  . Colon cancer Neg Hx   . Esophageal cancer Neg Hx   . Rectal cancer Neg Hx   . Stomach cancer Neg Hx     Social History   Socioeconomic History  . Marital status: Married    Spouse name: [name]  . Number of children: 1  . Years of education: AA  . Highest education level: Not on file  Occupational History    Employer: [employer]  Tobacco Use  . Smoking status: Never  . Smokeless tobacco: Never  Vaping Use  . Vaping Use: Never used  Substance and Sexual Activity  . Alcohol use: No  . Drug use: No  . Sexual activity: Not on file  Other Topics Concern  . Not on file  Social History Narrative   Pneumonia vaccine 2005   Patient lives at home with wife and 2 sons.    Patient works in [place].    Patient has her AA   Right handed   One story home      Social Determinants of Health   Financial Resource Strain: Not on file  Food Insecurity: Not on file  Transportation Needs: Not on file  Physical Activity: Not on file  Stress: Not on file  Social Connections: Not on file  Intimate Partner Violence: Not on file    Review of Systems: ROS is O/W negative except as mentioned in  HPI.  Physical Exam: Vital signs in last 24 hours: Temp:  [98 F (36.7 C)] 98 F (36.7 C) (11/02 1939) Pulse Rate:  [77-103] 99 (11/03 0843) Resp:  [8-28] 16 (11/03 0843) BP: (98-128)/(43-70) 122/69 (11/03 0843) SpO2:  [97 %-100 %] 97 % (11/03 0843) Weight:  [90.7 kg] 90.7 kg (11/02 1939)   General:  Alert, Well-developed, well-nourished, pleasant and cooperative in NAD Head:  Normocephalic and atraumatic. Eyes:  Sclera clear, no icterus.  Conjunctiva pink. Ears:  Normal auditory acuity. Mouth:  No deformity or lesions.   Lungs:  Clear throughout to auscultation.  No wheezes, crackles, or rhonchi.  Heart:  Regular rate and rhythm; no murmurs, clicks, rubs, or gallops. Abdomen:  Soft, non-distended.  BS present.  TTP in the epigastrium and LUQ.  Msk:  Symmetrical without gross deformities. Pulses:  Normal pulses noted. Extremities:  Without clubbing or edema. Neurologic:  Alert and oriented x 4;  grossly normal neurologically. Skin:  Intact without significant lesions or rashes. Psych:  Alert and cooperative. Normal mood and affect.  Intake/Output from previous day: 11/02 0701 - 11/03 0700 In: 2000 [IV Piggyback:2000] Out: -   Lab Results: Recent Labs    06/28/21 2010 06/29/21 0655  WBC 3.2* 4.3  HGB 11.4* 10.4*  HCT 35.6* 31.8*  PLT 127* 125*   BMET Recent Labs    06/28/21 2010 06/29/21 0655  NA 134* 138  K 3.7 3.7  CL 103 107  CO2 23 24  GLUCOSE 122* 94  BUN 14 12  CREATININE 1.15* 0.97  CALCIUM 8.5* 8.5*   LFT Recent Labs    06/29/21 0655  PROT 6.5  ALBUMIN 3.2*  3.3*  AST 257*  ALT 319*  ALKPHOS 120  BILITOT 1.2  BILIDIR 0.4*  IBILI 0.8   PT/INR Recent Labs    06/29/21 0655  LABPROT 15.1  INR 1.2   Studies/Results: DG Chest 1 View  Result Date: 06/29/2021 CLINICAL DATA:  Syncope EXAM: CHEST  1 VIEW COMPARISON:  Chest x-ray 02/17/2021 FINDINGS: Heart size and mediastinal contours are within normal limits. No suspicious pulmonary  opacities identified. Right-sided central venous port with the tip in the SVC. No pleural effusion or pneumothorax visualized. No acute osseous abnormality appreciated. IMPRESSION: No acute intrathoracic process identified. Electronically Signed   By: Ofilia Neas M.D.   On: 06/29/2021 07:30   CT ABDOMEN PELVIS W CONTRAST  Result Date: 06/28/2021 CLINICAL DATA:  Abdominal pain, nausea, vomiting EXAM: CT ABDOMEN AND PELVIS WITH CONTRAST TECHNIQUE: Multidetector CT imaging of the abdomen and pelvis was performed using the standard protocol following bolus administration of intravenous contrast. CONTRAST:  60m OMNIPAQUE IOHEXOL 350 MG/ML SOLN COMPARISON:  06/23/2021 FINDINGS: Lower chest: The visualized lung bases are clear. Visualized heart and pericardium are unremarkable. Asymmetric wall thickening within the tiny hiatal hernia appears stable since prior PET CT examination and was metabolically negative at that time. Hepatobiliary: Hemangioma within the right hepatic dome is unchanged. Multiple scattered hepatic cysts are again identified. Suspected metastatic lesion within the subserosal inferior right hepatic lobe is not well appreciated on this examination. Pneumobilia again noted. No intra or extrahepatic biliary ductal dilation. Palliative biliary stent noted within the extrahepatic bile duct. The gallbladder is unremarkable. Pancreas: Incomplete pancreatic divisum noted. The fullness of the pancreatic head previously noted is stable. Mild hypodensity surrounding the palliative biliary stent within the pancreatic head is nonspecific but appears new since prior examination, possibly representing the patient's primary mass or peribiliary edema. The pancreatic duct is not dilated. The remainder of the pancreatic parenchyma demonstrates normal enhancement. Spleen: Unremarkable Adrenals/Urinary Tract: Adrenal glands are unremarkable. Kidneys are normal, without renal calculi, focal lesion, or  hydronephrosis. Bladder is unremarkable. Stomach/Bowel: The stomach, large bowel, and small bowel are unremarkable. Appendix normal. No free intraperitoneal gas or fluid. Vascular/Lymphatic: Aortic atherosclerosis. No enlarged abdominal or pelvic lymph nodes. Reproductive: Status post hysterectomy. No adnexal masses. Other: No abdominal wall hernia. Musculoskeletal: No acute bone abnormality. No lytic or blastic bone lesion. IMPRESSION: No acute intra-abdominal pathology identified. No definite radiographic explanation for the patient's reported symptoms. Palliative biliary stent unchanged within the distal common duct. Interval development of mild peribiliary hypoattenuation within the pancreatic head which may represent the patient's primary mass or peribiliary edema, but is nonspecific. Known hepatic metastasis is  not well visualized on this examination. Aortic Atherosclerosis (ICD10-I70.0). Electronically Signed   By: Fidela Salisbury M.D.   On: 06/28/2021 22:48   US Abdomen Limited  Result Date: 06/29/2021 CLINICAL DATA:  Elevated LFTs, pancreatic cancer, biliary stent in place EXAM: ULTRASOUND ABDOMEN LIMITED RIGHT UPPER QUADRANT COMPARISON:  CT abdomen/pelvis dated 06/28/2021 FINDINGS: Gallbladder: Mild gallbladder wall thickening/edema. No cholelithiasis, gallbladder distention, or pericholecystic fluid. Negative sonographic Murphy's sign. Common bile duct: Diameter: 3 mm.  Indwelling common duct stent. Liver: 2.5 x 2.43.5 cm hyperechoic lesion in the right hepatic lobe, likely corresponding to the patient's known benign hemangioma. 10 x 9 x 10 mm cyst in the right hepatic lobe. Additional lesions are better evaluated on prior CTs. Pneumobilia. Portal vein is patent on color Doppler imaging with normal direction of blood flow towards the liver. Other: None. IMPRESSION: Indwelling common duct stent. Benign hemangioma and hepatic cyst. Additional hepatic lesions are better evaluated on prior CTs. Mild  gallbladder wall thickening/edema, without sonographic findings to suggest acute cholecystitis. Electronically Signed   By: Julian Hy M.D.   On: 06/29/2021 01:08    IMPRESSION:  *Pancreatic adenocarcinoma status post metal stent placement on 02/09/2021.  Follows with [name].  Presented with syncope, epigastric abdominal pain and acute elevation in her LFTs as compared to 7 days ago.  Those remain elevated, but have come down significantly already today.  AST 257, ALT 319, alk phos 120, total bili 1.2.  Her ultrasound shows mild gallbladder wall thickening.  Her pain is more in the epigastrium and on the left.  Not sure what is causing her LFTs.  Could be cystic duct obstruction in the setting of SEMS but could also be covid related, ? Chemo induced. *Covid positive  PLAN: -Will observe overnight and recheck LFTs in the morning before deciding on any other testing or intervention for now. -Otherwise diet as tolerated.  **Discussed with the patient and her wife by phone.  Laban Emperor. Zehr  06/29/2021, 10:02 AM     Attending Physician Note   I have taken a history, reviewed the chart and examined the patient. I personally saw the patient and performed a substantive portion of this encounter, including a complete performance of at least one of the key components, in conjunction with the APP. I agree with the APP's note, impression and recommendations.    Pancreatic adenocarcinoma on FOLFIRINOX followed by [name] with biliary obstruction S/P covered SEMS placement in June 2022 admitted today with epigastric pain, elevated LFTs, slightly thickened GB wall, Covid-19 positive with a mild cough.   R/O chemo, Covid, biliary stent occlusion, cystic duct obstruction or pancreatic tumor leading to current symptoms, LFT elevation. Monitor symptoms and trend LFTs for now. If more evidence of stent occlusion or cystic duct obstruction proceed with ERCP, stent change.    Lucio Edward, MD  [facility] See [facility], [facility], for our on call provider

## 2021-06-29 NOTE — Assessment & Plan Note (Addendum)
   Diagnosed 01/2021 with pancreatic cancer with metastases to the liver  Currently receiving chemotherapy FOLFIRINOX  Following with [name] in the outpatient setting  Status post common bile duct stent 01/2021 by Dr. Rush Landmark  Continue outpatient follow-up

## 2021-06-29 NOTE — Assessment & Plan Note (Signed)
?   No evidence of acute asthma exacerbation ?? As needed bronchodilator therapy for shortness of breath and wheezing. ? ?

## 2021-06-29 NOTE — Assessment & Plan Note (Signed)
   Incidental finding  Chest x-ray unremarkable  Patient on room air  Patient complains of a 1 day history of dry nonproductive cough  In an effort to prevent progression of COVID related illness in this patient with active malignancy will initiate antiviral therapy   Per my discussion with pharmacy, Molnupirivir is recommended over backslid and therefore this will be ordered  Contact and airborne isolation  Zinc and vitamin C supplementation  As needed bronchodilator therapy for wheezing  As needed antitussives for cough

## 2021-06-29 NOTE — Assessment & Plan Note (Signed)
   Patient presenting with 2-week history of substantial epigastric pain in setting of an indwelling common bile duct stent concurrent elevated transaminases, alkaline phosphatase and bilirubin  CT imaging of the abdomen reveals no evidence of stent failure  Providing patient with as needed opiate-based analgesics for associated pain  Case discussed with [facility] who will evaluate patient today in consultation  Patient currently n.p.o. in case of intervention  Hydrating patient with intravenous isotonic fluids

## 2021-06-29 NOTE — Assessment & Plan Note (Signed)
   Continue home regimen of Neurontin 

## 2021-06-29 NOTE — Progress Notes (Signed)
Robin [name]   DOB:1966/03/16   DU#:[id number]   CBJ#:[id number]  Oncology follow up note   Subjective: Patient to me, under my care for her metastatic pancreatic cancer.  She is on chemotherapy.  She presented to emergency room last night after syncope episode.  Labs showed transaminitis, she was also found to be COVID-positive.  She only has mild dry cough today, denies any Sofsorb, nasal congestion, or dyspnea.   Objective:  Vitals:   06/29/21 1503 06/29/21 1654  BP:  121/65  Pulse:  91  Resp:  16  Temp: 98.6 F (37 C) 99.6 F (37.6 C)  SpO2:  100%    Body mass index is 29.53 kg/m.  Intake/Output Summary (Last 24 hours) at 06/29/2021 2059 Last data filed at 06/29/2021 0124 Gross per 24 hour  Intake 2000 ml  Output --  Net 2000 ml     Sclerae unicteric  I did not exam her   CBG (last 3)  Recent Labs    06/28/21 2023  GLUCAP 112*     Labs:  Urine Studies No results for input(s): UHGB, CRYS in the last 72 hours.  Invalid input(s): UACOL, UAPR, USPG, UPH, UTP, UGL, UKET, UBIL, UNIT, UROB, ULEU, UEPI, UWBC, URBC, UBAC, CAST, UCOM, BILUA  Basic Metabolic Panel: Recent Labs  Lab 06/28/21 2010 06/29/21 0655  NA 134* 138  K 3.7 3.7  CL 103 107  CO2 23 24  GLUCOSE 122* 94  BUN 14 12  CREATININE 1.15* 0.97  CALCIUM 8.5* 8.5*  PHOS  --  2.9   GFR Estimated Creatinine Clearance: 77.7 mL/min (by C-G formula based on SCr of 0.97 mg/dL). Liver Function Tests: Recent Labs  Lab 06/28/21 2010 06/29/21 0655  AST 505* 257*  ALT 458* 319*  ALKPHOS 149* 120  BILITOT 1.4* 1.2  PROT 7.0 6.5  ALBUMIN 3.8 3.2*  3.3*   Recent Labs  Lab 06/28/21 2010  LIPASE 22   No results for input(s): AMMONIA in the last 168 hours. Coagulation profile Recent Labs  Lab 06/29/21 0655  INR 1.2    CBC: Recent Labs  Lab 06/28/21 2010 06/29/21 0655  WBC 3.2* 4.3  NEUTROABS 2.4 3.0  HGB 11.4* 10.4*  HCT 35.6* 31.8*  MCV 90.4 91.1  PLT 127* 125*   Cardiac  Enzymes: Recent Labs  Lab 06/29/21 0655  CKTOTAL 70   BNP: Invalid input(s): POCBNP CBG: Recent Labs  Lab 06/28/21 2023  GLUCAP 112*   D-Dimer No results for input(s): DDIMER in the last 72 hours. Hgb A1c No results for input(s): HGBA1C in the last 72 hours. Lipid Profile No results for input(s): CHOL, HDL, LDLCALC, TRIG, CHOLHDL, LDLDIRECT in the last 72 hours. Thyroid function studies No results for input(s): TSH, T4TOTAL, T3FREE, THYROIDAB in the last 72 hours.  Invalid input(s): FREET3 Anemia work up No results for input(s): VITAMINB12, FOLATE, FERRITIN, TIBC, IRON, RETICCTPCT in the last 72 hours. Microbiology Recent Results (from the past 240 hour(s))  Resp Panel by RT-PCR (Flu A&B, Covid) Nasopharyngeal Swab     Status: Abnormal   Collection Time: 06/29/21  5:00 AM   Specimen: Nasopharyngeal Swab; Nasopharyngeal(NP) swabs in vial transport medium  Result Value Ref Range Status   SARS Coronavirus 2 by RT PCR POSITIVE (A) NEGATIVE Final    Comment: RESULT CALLED TO, READ BACK BY AND VERIFIED WITH: BLACK,C. RN @0646  ON 11.3.2022 BY NMCCOY (NOTE) SARS-CoV-2 target nucleic acids are DETECTED.  The SARS-CoV-2 RNA is generally detectable in upper respiratory specimens  during the acute phase of infection. Positive results are indicative of the presence of the identified virus, but do not rule out bacterial infection or co-infection with other pathogens not detected by the test. Clinical correlation with patient history and other diagnostic information is necessary to determine patient infection status. The expected result is Negative.  Fact Sheet for Patients: EntrepreneurPulse.com.au  Fact Sheet for Healthcare Providers: IncredibleEmployment.be  This test is not yet approved or cleared by the [place] FDA and  has been authorized for detection and/or diagnosis of SARS-CoV-2 by FDA under an Emergency Use Authorization (EUA).   This EUA will remain in effect (meaning this test  can be used) for the duration of  the COVID-19 declaration under Section 564(b)(1) of the Act, 21 U.S.C. section 360bbb-3(b)(1), unless the authorization is terminated or revoked sooner.     Influenza A by PCR NEGATIVE NEGATIVE Final   Influenza B by PCR NEGATIVE NEGATIVE Final    Comment: (NOTE) The Xpert Xpress SARS-CoV-2/FLU/RSV plus assay is intended as an aid in the diagnosis of influenza from Nasopharyngeal swab specimens and should not be used as a sole basis for treatment. Nasal washings and aspirates are unacceptable for Xpert Xpress SARS-CoV-2/FLU/RSV testing.  Fact Sheet for Patients: EntrepreneurPulse.com.au  Fact Sheet for Healthcare Providers: IncredibleEmployment.be  This test is not yet approved or cleared by the [place] FDA and has been authorized for detection and/or diagnosis of SARS-CoV-2 by FDA under an Emergency Use Authorization (EUA). This EUA will remain in effect (meaning this test can be used) for the duration of the COVID-19 declaration under Section 564(b)(1) of the Act, 21 U.S.C. section 360bbb-3(b)(1), unless the authorization is terminated or revoked.  Performed at [facility], [place] [address], [place], [place] [address]       Studies:  DG Chest 1 View  Result Date: 06/29/2021 CLINICAL DATA:  Syncope EXAM: CHEST  1 VIEW COMPARISON:  Chest x-ray 02/17/2021 FINDINGS: Heart size and mediastinal contours are within normal limits. No suspicious pulmonary opacities identified. Right-sided central venous port with the tip in the SVC. No pleural effusion or pneumothorax visualized. No acute osseous abnormality appreciated. IMPRESSION: No acute intrathoracic process identified. Electronically Signed   By: Ofilia Neas M.D.   On: 06/29/2021 07:30   CT ABDOMEN PELVIS W CONTRAST  Result Date: 06/28/2021 CLINICAL DATA:  Abdominal pain,  nausea, vomiting EXAM: CT ABDOMEN AND PELVIS WITH CONTRAST TECHNIQUE: Multidetector CT imaging of the abdomen and pelvis was performed using the standard protocol following bolus administration of intravenous contrast. CONTRAST:  73mL OMNIPAQUE IOHEXOL 350 MG/ML SOLN COMPARISON:  06/23/2021 FINDINGS: Lower chest: The visualized lung bases are clear. Visualized heart and pericardium are unremarkable. Asymmetric wall thickening within the tiny hiatal hernia appears stable since prior PET CT examination and was metabolically negative at that time. Hepatobiliary: Hemangioma within the right hepatic dome is unchanged. Multiple scattered hepatic cysts are again identified. Suspected metastatic lesion within the subserosal inferior right hepatic lobe is not well appreciated on this examination. Pneumobilia again noted. No intra or extrahepatic biliary ductal dilation. Palliative biliary stent noted within the extrahepatic bile duct. The gallbladder is unremarkable. Pancreas: Incomplete pancreatic divisum noted. The fullness of the pancreatic head previously noted is stable. Mild hypodensity surrounding the palliative biliary stent within the pancreatic head is nonspecific but appears new since prior examination, possibly representing the patient's primary mass or peribiliary edema. The pancreatic duct is not dilated. The remainder of the pancreatic parenchyma demonstrates normal enhancement. Spleen:  Unremarkable Adrenals/Urinary Tract: Adrenal glands are unremarkable. Kidneys are normal, without renal calculi, focal lesion, or hydronephrosis. Bladder is unremarkable. Stomach/Bowel: The stomach, large bowel, and small bowel are unremarkable. Appendix normal. No free intraperitoneal gas or fluid. Vascular/Lymphatic: Aortic atherosclerosis. No enlarged abdominal or pelvic lymph nodes. Reproductive: Status post hysterectomy. No adnexal masses. Other: No abdominal wall hernia. Musculoskeletal: No acute bone abnormality. No  lytic or blastic bone lesion. IMPRESSION: No acute intra-abdominal pathology identified. No definite radiographic explanation for the patient's reported symptoms. Palliative biliary stent unchanged within the distal common duct. Interval development of mild peribiliary hypoattenuation within the pancreatic head which may represent the patient's primary mass or peribiliary edema, but is nonspecific. Known hepatic metastasis is not well visualized on this examination. Aortic Atherosclerosis (ICD10-I70.0). Electronically Signed   By: Fidela Salisbury M.D.   On: 06/28/2021 22:48   US Abdomen Limited  Result Date: 06/29/2021 CLINICAL DATA:  Elevated LFTs, pancreatic cancer, biliary stent in place EXAM: ULTRASOUND ABDOMEN LIMITED RIGHT UPPER QUADRANT COMPARISON:  CT abdomen/pelvis dated 06/28/2021 FINDINGS: Gallbladder: Mild gallbladder wall thickening/edema. No cholelithiasis, gallbladder distention, or pericholecystic fluid. Negative sonographic Murphy's sign. Common bile duct: Diameter: 3 mm.  Indwelling common duct stent. Liver: 2.5 x 2.43.5 cm hyperechoic lesion in the right hepatic lobe, likely corresponding to the patient's known benign hemangioma. 10 x 9 x 10 mm cyst in the right hepatic lobe. Additional lesions are better evaluated on prior CTs. Pneumobilia. Portal vein is patent on color Doppler imaging with normal direction of blood flow towards the liver. Other: None. IMPRESSION: Indwelling common duct stent. Benign hemangioma and hepatic cyst. Additional hepatic lesions are better evaluated on prior CTs. Mild gallbladder wall thickening/edema, without sonographic findings to suggest acute cholecystitis. Electronically Signed   By: Julian Hy M.D.   On: 06/29/2021 01:08   ECHOCARDIOGRAM COMPLETE  Result Date: 06/29/2021    ECHOCARDIOGRAM REPORT   Patient Name:   MARVEL SAPP Date of Exam: 06/29/2021 Medical Rec #:  [id number]       Height:       69.0 in Accession #:    [id number]      Weight:        200.0 lb Date of Birth:  1966-08-24      BSA:          2.066 m Patient Age:    60 years        BP:           122/69 mmHg Patient Gender: F               HR:           90 bpm. Exam Location:  Inpatient Procedure: 2D Echo, Cardiac Doppler and Color Doppler Indications:    R06.9 DOE  History:        Patient has prior history of Echocardiogram examinations, most                 recent 07/22/2017. Signs/Symptoms:Syncope and Dyspnea.  Sonographer:    Glo Herring Referring Phys: [id number] [place]  1. Left ventricular ejection fraction, by estimation, is 55 to 60%. The left ventricle has normal function. The left ventricle has no regional wall motion abnormalities. Left ventricular diastolic parameters were normal.  2. Right ventricular systolic function is normal. The right ventricular size is normal. There is normal pulmonary artery systolic pressure. The estimated right ventricular systolic pressure is 56.4 mmHg.  3. The mitral valve is normal in structure. Trivial  mitral valve regurgitation. No evidence of mitral stenosis.  4. The aortic valve is tricuspid. Aortic valve regurgitation is trivial. No aortic stenosis is present.  5. The inferior vena cava is dilated in size with >50% respiratory variability, suggesting right atrial pressure of 8 mmHg. FINDINGS  Left Ventricle: Left ventricular ejection fraction, by estimation, is 55 to 60%. The left ventricle has normal function. The left ventricle has no regional wall motion abnormalities. The left ventricular internal cavity size was normal in size. There is  no left ventricular hypertrophy. Left ventricular diastolic parameters were normal. Right Ventricle: The right ventricular size is normal. No increase in right ventricular wall thickness. Right ventricular systolic function is normal. There is normal pulmonary artery systolic pressure. The tricuspid regurgitant velocity is 2.14 m/s, and  with an assumed right atrial pressure of 8 mmHg, the  estimated right ventricular systolic pressure is 95.0 mmHg. Left Atrium: Left atrial size was normal in size. Right Atrium: Right atrial size was normal in size. Pericardium: There is no evidence of pericardial effusion. Mitral Valve: The mitral valve is normal in structure. Trivial mitral valve regurgitation. No evidence of mitral valve stenosis. MV peak gradient, 4.2 mmHg. The mean mitral valve gradient is 2.0 mmHg. Tricuspid Valve: The tricuspid valve is normal in structure. Tricuspid valve regurgitation is trivial. Aortic Valve: The aortic valve is tricuspid. Aortic valve regurgitation is trivial. No aortic stenosis is present. Aortic valve mean gradient measures 4.0 mmHg. Aortic valve peak gradient measures 7.2 mmHg. Aortic valve area, by VTI measures 1.81 cm. Pulmonic Valve: The pulmonic valve was normal in structure. Pulmonic valve regurgitation is trivial. Aorta: The aortic root is normal in size and structure. Venous: The inferior vena cava is dilated in size with greater than 50% respiratory variability, suggesting right atrial pressure of 8 mmHg. IAS/Shunts: No atrial level shunt detected by color flow Doppler.  LEFT VENTRICLE PLAX 2D LVIDd:         4.60 cm   Diastology LVIDs:         3.25 cm   LV e' medial:    11.50 cm/s LV PW:         0.95 cm   LV E/e' medial:  7.6 LV IVS:        0.95 cm   LV e' lateral:   12.70 cm/s LVOT diam:     2.00 cm   LV E/e' lateral: 6.9 LV SV:         49 LV SV Index:   24 LVOT Area:     3.14 cm  RIGHT VENTRICLE             IVC RV Basal diam:  4.00 cm     IVC diam: 2.20 cm RV S prime:     11.00 cm/s LEFT ATRIUM             Index        RIGHT ATRIUM           Index LA diam:        3.60 cm 1.74 cm/m   RA Area:     14.50 cm LA Vol (A2C):   62.4 ml 30.20 ml/m  RA Volume:   32.50 ml  15.73 ml/m LA Vol (A4C):   55.4 ml 26.82 ml/m LA Biplane Vol: 58.0 ml 28.07 ml/m  AORTIC VALVE                    PULMONIC VALVE AV Area (Vmax):  2.04 cm     PV Vmax:       0.95 m/s AV Area  (Vmean):   1.87 cm     PV Peak grad:  3.6 mmHg AV Area (VTI):     1.81 cm AV Vmax:           134.00 cm/s AV Vmean:          95.600 cm/s AV VTI:            0.271 m AV Peak Grad:      7.2 mmHg AV Mean Grad:      4.0 mmHg LVOT Vmax:         87.00 cm/s LVOT Vmean:        57.000 cm/s LVOT VTI:          0.156 m LVOT/AV VTI ratio: 0.58  AORTA Ao Root diam: 2.75 cm Ao Asc diam:  2.80 cm MITRAL VALVE               TRICUSPID VALVE MV Area (PHT): 3.66 cm    TR Peak grad:   18.3 mmHg MV Area VTI:   2.05 cm    TR Vmax:        214.00 cm/s MV Peak grad:  4.2 mmHg MV Mean grad:  2.0 mmHg    SHUNTS MV Vmax:       1.02 m/s    Systemic VTI:  0.16 m MV Vmean:      73.8 cm/s   Systemic Diam: 2.00 cm MV Decel Time: 207 msec MV E velocity: 87.80 cm/s MV A velocity: 64.70 cm/s MV E/A ratio:  1.36 Dalton McleanMD Electronically signed by Franki Monte Signature Date/Time: 06/29/2021/4:18:14 PM    Final     Assessment: 55 y.o. female   Syncope and hypotension, likely secondary to dehydration and poor oral intake  Abnormal LFTs COVID infection Metastatic pancreatic cancer, on chemotherapy, last dose 10/27, s/p CBD stent placement  Abdominal pain secondary to #4, rule out acute cholecystitis    Plan:  -I reviewed her labs and scan images, her transaminitis has improved today -GI has been consulted for her abnormal LFTs. No high concern for stent occlusion or acute cholangitis.  Ultrasound showed mild gallbladder wall thickening, no ultrasound evidence of acute cholecystitis. -Although she is mostly asymptomatic and chest x-ray was negative, I agree with COVID treatment due to her compromised immune system from chemotherapy -Continue supportive care -I will f/u as needed  -will postpone her next scheduled chemo    Truitt Merle, MD 06/29/2021

## 2021-06-29 NOTE — Assessment & Plan Note (Signed)
   Possible orthostatic syncope due to poor oral intake  Monitoring patient on telemetry  Obtaining echocardiogram  Obtaining orthostatic vital signs

## 2021-06-29 NOTE — ED Notes (Signed)
MD and [place] at bedside conversing with pt.

## 2021-06-30 DIAGNOSIS — U071 COVID-19: Secondary | ICD-10-CM | POA: Diagnosis not present

## 2021-06-30 DIAGNOSIS — C250 Malignant neoplasm of head of pancreas: Secondary | ICD-10-CM | POA: Diagnosis not present

## 2021-06-30 DIAGNOSIS — R7989 Other specified abnormal findings of blood chemistry: Secondary | ICD-10-CM | POA: Diagnosis not present

## 2021-06-30 DIAGNOSIS — R55 Syncope and collapse: Secondary | ICD-10-CM | POA: Diagnosis not present

## 2021-06-30 DIAGNOSIS — R1013 Epigastric pain: Secondary | ICD-10-CM | POA: Diagnosis not present

## 2021-06-30 LAB — HEPATIC FUNCTION PANEL
ALT: 192 U/L — ABNORMAL HIGH (ref 0–44)
AST: 104 U/L — ABNORMAL HIGH (ref 15–41)
Albumin: 3 g/dL — ABNORMAL LOW (ref 3.5–5.0)
Alkaline Phosphatase: 100 U/L (ref 38–126)
Bilirubin, Direct: 0.1 mg/dL (ref 0.0–0.2)
Indirect Bilirubin: 0.8 mg/dL (ref 0.3–0.9)
Total Bilirubin: 0.9 mg/dL (ref 0.3–1.2)
Total Protein: 6.1 g/dL — ABNORMAL LOW (ref 6.5–8.1)

## 2021-06-30 MED ORDER — MOLNUPIRAVIR EUA 200MG CAPSULE: 4.0000 | ORAL_CAPSULE | Freq: Two times a day (BID) | ORAL | 0 refills | Status: AC

## 2021-06-30 MED ORDER — HEPARIN SOD (PORK) LOCK FLUSH 100 UNIT/ML IV SOLN
500.0000 [IU] | INTRAVENOUS | Status: AC | PRN
  Administered 2021-06-30: 500 [IU]
  Filled 2021-06-30: qty 5

## 2021-06-30 NOTE — Discharge Summary (Signed)
Physician Discharge Summary  Robin [name] KVQ:[id number] DOB: 05/18/1966 DOA: 06/28/2021  PCP: [name], MD  Admit date: 06/28/2021 Discharge date: 06/30/2021  Discharge disposition: Home   Recommendations for Outpatient Follow-Up:   Follow-up with PCP in 1 week. Follow-up with oncologist as soon as possible   Discharge Diagnosis:   Principal Problem:   Syncope Active Problems:   Pancreatic cancer (HCC)   Epigastric pain   Chemotherapy-induced neuropathy (HCC)   Abnormal LFTs   Mild intermittent asthma without complication   IEPPI-95 virus infection    Discharge Condition: Stable.  Diet recommendation:  Diet Order             Diet - low sodium heart healthy           Diet Heart Room service appropriate? Yes; Fluid consistency: Thin  Diet effective now                     Code Status: Full Code     Hospital Course:   Ms. Robin [name] is a 55 year old female with past medical history of asthma, pancreatic cancer with liver metastases (Dx 01/2021 on FOLFIRINOX follows with [name]) with chronic abdominal pain and diarrhea, S/P biliary stent placement 01/2021, bipolar 1 disorder, chemotherapy induced neuropathy, who presented to the hospital because of syncope/loss of consciousness.  She said she had been experiencing generalized weakness and lightheadedness (particularly worse with rising from a seated position) prior to syncope.  Initial blood pressure was 70/40 when EMS assessed her. She also complained of cough which, according to her, started when she got to the hospital.  She was admitted to the hospital for syncope, dehydration.  She was also found to have COVID-19 infection and elevated liver enzymes.  COVID-19 infection was treated with molnupiravir because of her immunocompromised status.  She was also given IV fluids for hypotension.  Gastroenterologist was consulted for elevated liver enzymes because she has a biliary stent in place.   Fortunately, liver enzymes improved.  Overall, her condition has improved and she is deemed stable for discharge to home today.    Medical Consultants:   Oncologist Gastroenterologist   Discharge Exam:    Vitals:   06/29/21 1654 06/29/21 2124 06/30/21 0104 06/30/21 0502  BP: 121/65 122/78 128/80 114/70  Pulse: 91 86 76 81  Resp: 16 18 18 18   Temp: 99.6 F (37.6 C) 99.7 F (37.6 C) 98.9 F (37.2 C) 98.9 F (37.2 C)  TempSrc: Oral Oral Oral Oral  SpO2: 100% 100% 100% 100%  Weight:      Height:         GEN: NAD SKIN: Warm and dry EYES: No pallor or icterus ENT: MMM CV: RRR PULM: CTA B ABD: soft, obese, NT, +BS CNS: AAO x 3, non focal EXT: No edema or tenderness   The results of significant diagnostics from this hospitalization (including imaging, microbiology, ancillary and laboratory) are listed below for reference.     Procedures and Diagnostic Studies:   DG Chest 1 View  Result Date: 06/29/2021 CLINICAL DATA:  Syncope EXAM: CHEST  1 VIEW COMPARISON:  Chest x-ray 02/17/2021 FINDINGS: Heart size and mediastinal contours are within normal limits. No suspicious pulmonary opacities identified. Right-sided central venous port with the tip in the SVC. No pleural effusion or pneumothorax visualized. No acute osseous abnormality appreciated. IMPRESSION: No acute intrathoracic process identified. Electronically Signed   By: Ofilia Neas M.D.   On: 06/29/2021 07:30   CT ABDOMEN  PELVIS W CONTRAST  Result Date: 06/28/2021 CLINICAL DATA:  Abdominal pain, nausea, vomiting EXAM: CT ABDOMEN AND PELVIS WITH CONTRAST TECHNIQUE: Multidetector CT imaging of the abdomen and pelvis was performed using the standard protocol following bolus administration of intravenous contrast. CONTRAST:  78mL OMNIPAQUE IOHEXOL 350 MG/ML SOLN COMPARISON:  06/23/2021 FINDINGS: Lower chest: The visualized lung bases are clear. Visualized heart and pericardium are unremarkable. Asymmetric wall  thickening within the tiny hiatal hernia appears stable since prior PET CT examination and was metabolically negative at that time. Hepatobiliary: Hemangioma within the right hepatic dome is unchanged. Multiple scattered hepatic cysts are again identified. Suspected metastatic lesion within the subserosal inferior right hepatic lobe is not well appreciated on this examination. Pneumobilia again noted. No intra or extrahepatic biliary ductal dilation. Palliative biliary stent noted within the extrahepatic bile duct. The gallbladder is unremarkable. Pancreas: Incomplete pancreatic divisum noted. The fullness of the pancreatic head previously noted is stable. Mild hypodensity surrounding the palliative biliary stent within the pancreatic head is nonspecific but appears new since prior examination, possibly representing the patient's primary mass or peribiliary edema. The pancreatic duct is not dilated. The remainder of the pancreatic parenchyma demonstrates normal enhancement. Spleen: Unremarkable Adrenals/Urinary Tract: Adrenal glands are unremarkable. Kidneys are normal, without renal calculi, focal lesion, or hydronephrosis. Bladder is unremarkable. Stomach/Bowel: The stomach, large bowel, and small bowel are unremarkable. Appendix normal. No free intraperitoneal gas or fluid. Vascular/Lymphatic: Aortic atherosclerosis. No enlarged abdominal or pelvic lymph nodes. Reproductive: Status post hysterectomy. No adnexal masses. Other: No abdominal wall hernia. Musculoskeletal: No acute bone abnormality. No lytic or blastic bone lesion. IMPRESSION: No acute intra-abdominal pathology identified. No definite radiographic explanation for the patient's reported symptoms. Palliative biliary stent unchanged within the distal common duct. Interval development of mild peribiliary hypoattenuation within the pancreatic head which may represent the patient's primary mass or peribiliary edema, but is nonspecific. Known hepatic  metastasis is not well visualized on this examination. Aortic Atherosclerosis (ICD10-I70.0). Electronically Signed   By: Fidela Salisbury M.D.   On: 06/28/2021 22:48   US Abdomen Limited  Result Date: 06/29/2021 CLINICAL DATA:  Elevated LFTs, pancreatic cancer, biliary stent in place EXAM: ULTRASOUND ABDOMEN LIMITED RIGHT UPPER QUADRANT COMPARISON:  CT abdomen/pelvis dated 06/28/2021 FINDINGS: Gallbladder: Mild gallbladder wall thickening/edema. No cholelithiasis, gallbladder distention, or pericholecystic fluid. Negative sonographic Robin's sign. Common bile duct: Diameter: 3 mm.  Indwelling common duct stent. Liver: 2.5 x 2.43.5 cm hyperechoic lesion in the right hepatic lobe, likely corresponding to the patient's known benign hemangioma. 10 x 9 x 10 mm cyst in the right hepatic lobe. Additional lesions are better evaluated on prior CTs. Pneumobilia. Portal vein is patent on color Doppler imaging with normal direction of blood flow towards the liver. Other: None. IMPRESSION: Indwelling common duct stent. Benign hemangioma and hepatic cyst. Additional hepatic lesions are better evaluated on prior CTs. Mild gallbladder wall thickening/edema, without sonographic findings to suggest acute cholecystitis. Electronically Signed   By: Julian Hy M.D.   On: 06/29/2021 01:08   ECHOCARDIOGRAM COMPLETE  Result Date: 06/29/2021    ECHOCARDIOGRAM REPORT   Patient Name:   Robin [name] Date of Exam: 06/29/2021 Medical Rec #:  [id number]       Height:       69.0 in Accession #:    [id number]      Weight:       200.0 lb Date of Birth:  [birth year]      BSA:  2.066 m Patient Age:    88 years        BP:           122/69 mmHg Patient Gender: F               HR:           90 bpm. Exam Location:  Inpatient Procedure: 2D Echo, Cardiac Doppler and Color Doppler Indications:    R06.9 DOE  History:        Patient has prior history of Echocardiogram examinations, most                 recent 07/22/2017.  Signs/Symptoms:Syncope and Dyspnea.  Sonographer:    Glo Herring Referring Phys: [id number] Wiggins  1. Left ventricular ejection fraction, by estimation, is 55 to 60%. The left ventricle has normal function. The left ventricle has no regional wall motion abnormalities. Left ventricular diastolic parameters were normal.  2. Right ventricular systolic function is normal. The right ventricular size is normal. There is normal pulmonary artery systolic pressure. The estimated right ventricular systolic pressure is 41.6 mmHg.  3. The mitral valve is normal in structure. Trivial mitral valve regurgitation. No evidence of mitral stenosis.  4. The aortic valve is tricuspid. Aortic valve regurgitation is trivial. No aortic stenosis is present.  5. The inferior vena cava is dilated in size with >50% respiratory variability, suggesting right atrial pressure of 8 mmHg. FINDINGS  Left Ventricle: Left ventricular ejection fraction, by estimation, is 55 to 60%. The left ventricle has normal function. The left ventricle has no regional wall motion abnormalities. The left ventricular internal cavity size was normal in size. There is  no left ventricular hypertrophy. Left ventricular diastolic parameters were normal. Right Ventricle: The right ventricular size is normal. No increase in right ventricular wall thickness. Right ventricular systolic function is normal. There is normal pulmonary artery systolic pressure. The tricuspid regurgitant velocity is 2.14 m/s, and  with an assumed right atrial pressure of 8 mmHg, the estimated right ventricular systolic pressure is 38.4 mmHg. Left Atrium: Left atrial size was normal in size. Right Atrium: Right atrial size was normal in size. Pericardium: There is no evidence of pericardial effusion. Mitral Valve: The mitral valve is normal in structure. Trivial mitral valve regurgitation. No evidence of mitral valve stenosis. MV peak gradient, 4.2 mmHg. The mean mitral  valve gradient is 2.0 mmHg. Tricuspid Valve: The tricuspid valve is normal in structure. Tricuspid valve regurgitation is trivial. Aortic Valve: The aortic valve is tricuspid. Aortic valve regurgitation is trivial. No aortic stenosis is present. Aortic valve mean gradient measures 4.0 mmHg. Aortic valve peak gradient measures 7.2 mmHg. Aortic valve area, by VTI measures 1.81 cm. Pulmonic Valve: The pulmonic valve was normal in structure. Pulmonic valve regurgitation is trivial. Aorta: The aortic root is normal in size and structure. Venous: The inferior vena cava is dilated in size with greater than 50% respiratory variability, suggesting right atrial pressure of 8 mmHg. IAS/Shunts: No atrial level shunt detected by color flow Doppler.  LEFT VENTRICLE PLAX 2D LVIDd:         4.60 cm   Diastology LVIDs:         3.25 cm   LV e' medial:    11.50 cm/s LV PW:         0.95 cm   LV E/e' medial:  7.6 LV IVS:        0.95 cm   LV e' lateral:  12.70 cm/s LVOT diam:     2.00 cm   LV E/e' lateral: 6.9 LV SV:         49 LV SV Index:   24 LVOT Area:     3.14 cm  RIGHT VENTRICLE             IVC RV Basal diam:  4.00 cm     IVC diam: 2.20 cm RV S prime:     11.00 cm/s LEFT ATRIUM             Index        RIGHT ATRIUM           Index LA diam:        3.60 cm 1.74 cm/m   RA Area:     14.50 cm LA Vol (A2C):   62.4 ml 30.20 ml/m  RA Volume:   32.50 ml  15.73 ml/m LA Vol (A4C):   55.4 ml 26.82 ml/m LA Biplane Vol: 58.0 ml 28.07 ml/m  AORTIC VALVE                    PULMONIC VALVE AV Area (Vmax):    2.04 cm     PV Vmax:       0.95 m/s AV Area (Vmean):   1.87 cm     PV Peak grad:  3.6 mmHg AV Area (VTI):     1.81 cm AV Vmax:           134.00 cm/s AV Vmean:          95.600 cm/s AV VTI:            0.271 m AV Peak Grad:      7.2 mmHg AV Mean Grad:      4.0 mmHg LVOT Vmax:         87.00 cm/s LVOT Vmean:        57.000 cm/s LVOT VTI:          0.156 m LVOT/AV VTI ratio: 0.58  AORTA Ao Root diam: 2.75 cm Ao Asc diam:  2.80 cm MITRAL VALVE                TRICUSPID VALVE MV Area (PHT): 3.66 cm    TR Peak grad:   18.3 mmHg MV Area VTI:   2.05 cm    TR Vmax:        214.00 cm/s MV Peak grad:  4.2 mmHg MV Mean grad:  2.0 mmHg    SHUNTS MV Vmax:       1.02 m/s    Systemic VTI:  0.16 m MV Vmean:      73.8 cm/s   Systemic Diam: 2.00 cm MV Decel Time: 207 msec MV E velocity: 87.80 cm/s MV A velocity: 64.70 cm/s MV E/A ratio:  1.36 Dalton McleanMD Electronically signed by Franki Monte Signature Date/Time: 06/29/2021/4:18:14 PM    Final      Labs:   Basic Metabolic Panel: Recent Labs  Lab 06/28/21 2010 06/29/21 0655  NA 134* 138  K 3.7 3.7  CL 103 107  CO2 23 24  GLUCOSE 122* 94  BUN 14 12  CREATININE 1.15* 0.97  CALCIUM 8.5* 8.5*  PHOS  --  2.9   GFR Estimated Creatinine Clearance: 77.7 mL/min (by C-G formula based on SCr of 0.97 mg/dL). Liver Function Tests: Recent Labs  Lab 06/28/21 2010 06/29/21 0655 06/30/21 0448  AST 505* 257* 104*  ALT 458* 319* 192*  ALKPHOS 149* 120  100  BILITOT 1.4* 1.2 0.9  PROT 7.0 6.5 6.1*  ALBUMIN 3.8 3.2*  3.3* 3.0*   Recent Labs  Lab 06/28/21 2010  LIPASE 22   No results for input(s): AMMONIA in the last 168 hours. Coagulation profile Recent Labs  Lab 06/29/21 0655  INR 1.2    CBC: Recent Labs  Lab 06/28/21 2010 06/29/21 0655  WBC 3.2* 4.3  NEUTROABS 2.4 3.0  HGB 11.4* 10.4*  HCT 35.6* 31.8*  MCV 90.4 91.1  PLT 127* 125*   Cardiac Enzymes: Recent Labs  Lab 06/29/21 0655  CKTOTAL 70   BNP: Invalid input(s): POCBNP CBG: Recent Labs  Lab 06/28/21 2023  GLUCAP 112*   D-Dimer No results for input(s): DDIMER in the last 72 hours. Hgb A1c No results for input(s): HGBA1C in the last 72 hours. Lipid Profile No results for input(s): CHOL, HDL, LDLCALC, TRIG, CHOLHDL, LDLDIRECT in the last 72 hours. Thyroid function studies No results for input(s): TSH, T4TOTAL, T3FREE, THYROIDAB in the last 72 hours.  Invalid input(s): FREET3 Anemia work up No results  for input(s): VITAMINB12, FOLATE, FERRITIN, TIBC, IRON, RETICCTPCT in the last 72 hours. Microbiology Recent Results (from the past 240 hour(s))  Resp Panel by RT-PCR (Flu A&B, Covid) Nasopharyngeal Swab     Status: Abnormal   Collection Time: 06/29/21  5:00 AM   Specimen: Nasopharyngeal Swab; Nasopharyngeal(NP) swabs in vial transport medium  Result Value Ref Range Status   SARS Coronavirus 2 by RT PCR POSITIVE (A) NEGATIVE Final    Comment: RESULT CALLED TO, READ BACK BY AND VERIFIED WITH: BLACK,C. RN @0646  ON 11.3.2022 BY NMCCOY (NOTE) SARS-CoV-2 target nucleic acids are DETECTED.  The SARS-CoV-2 RNA is generally detectable in upper respiratory specimens during the acute phase of infection. Positive results are indicative of the presence of the identified virus, but do not rule out bacterial infection or co-infection with other pathogens not detected by the test. Clinical correlation with patient history and other diagnostic information is necessary to determine patient infection status. The expected result is Negative.  Fact Sheet for Patients: EntrepreneurPulse.com.au  Fact Sheet for Healthcare Providers: IncredibleEmployment.be  This test is not yet approved or cleared by the [place] FDA and  has been authorized for detection and/or diagnosis of SARS-CoV-2 by FDA under an Emergency Use Authorization (EUA).  This EUA will remain in effect (meaning this test  can be used) for the duration of  the COVID-19 declaration under Section 564(b)(1) of the Act, 21 U.S.C. section 360bbb-3(b)(1), unless the authorization is terminated or revoked sooner.     Influenza A by PCR NEGATIVE NEGATIVE Final   Influenza B by PCR NEGATIVE NEGATIVE Final    Comment: (NOTE) The Xpert Xpress SARS-CoV-2/FLU/RSV plus assay is intended as an aid in the diagnosis of influenza from Nasopharyngeal swab specimens and should not be used as a sole basis for  treatment. Nasal washings and aspirates are unacceptable for Xpert Xpress SARS-CoV-2/FLU/RSV testing.  Fact Sheet for Patients: EntrepreneurPulse.com.au  Fact Sheet for Healthcare Providers: IncredibleEmployment.be  This test is not yet approved or cleared by the [place] FDA and has been authorized for detection and/or diagnosis of SARS-CoV-2 by FDA under an Emergency Use Authorization (EUA). This EUA will remain in effect (meaning this test can be used) for the duration of the COVID-19 declaration under Section 564(b)(1) of the Act, 21 U.S.C. section 360bbb-3(b)(1), unless the authorization is terminated or revoked.  Performed at [facility], [address] [place], [place]  [address]      Discharge Instructions:   Discharge Instructions     Diet - low sodium heart healthy   Complete by: As directed    Increase activity slowly   Complete by: As directed       Allergies as of 06/30/2021       Reactions   Aspirin Nausea And Vomiting, Rash   Bee Venom Anaphylaxis   Penicillin G Anaphylaxis   Penicillins Anaphylaxis   Bactrim [sulfamethoxazole-trimethoprim] Nausea And Vomiting   Coconut Oil Other (See Comments)   Caused wheezing   Sulfa Antibiotics Nausea And Vomiting   Sulfamethoxazole-trimethoprim Nausea And Vomiting        Medication List     TAKE these medications    acetaminophen 325 MG tablet Commonly known as: TYLENOL Take 325-650 mg by mouth every 6 (six) hours as needed for mild pain or headache.   albuterol 108 (90 Base) MCG/ACT inhaler Commonly known as: VENTOLIN HFA Inhale 2 puffs into the lungs every 6 (six) hours as needed for wheezing or shortness of breath.   cyclobenzaprine 5 MG tablet Commonly known as: FLEXERIL TAKE 1 TABLET(5 MG) BY MOUTH AT BEDTIME AS NEEDED FOR LOWER BACK PAIN OR SEVERE HEADACHE What changed: See the new instructions.   dicyclomine 10 MG  capsule Commonly known as: Bentyl Take 1 capsule (10 mg total) by mouth 4 (four) times daily -  before meals and at bedtime.   diphenoxylate-atropine 2.5-0.025 MG tablet Commonly known as: LOMOTIL Take 1-2 tabs by mouth 4 times a day as needed for loose stools.   EPINEPHrine 0.3 mg/0.3 mL Soaj injection Commonly known as: EPI-PEN Inject 0.3 mg into the muscle as needed for anaphylaxis. INJECT 0.3 MLS (0.3 MG TOTAL) INTO THE MUSCLE ONCE What changed:  when to take this additional instructions   fluticasone 50 MCG/ACT nasal spray Commonly known as: FLONASE Place 2 sprays into both nostrils daily.   gabapentin 300 MG capsule Commonly known as: NEURONTIN Take 300 mg by mouth 3 (three) times daily as needed (for neuropathic pain).   Latuda 40 MG Tabs tablet Generic drug: lurasidone Take 1 tablet (40 mg total) by mouth daily with breakfast.   lidocaine-prilocaine cream Commonly known as: EMLA Apply to affected area once What changed:  how much to take how to take this when to take this additional instructions   magic mouthwash w/lidocaine Soln Take 10 mLs by mouth 4 (four) times daily as needed for mouth pain. 10 mls swish and spit by mouth four times daily as needed   molnupiravir EUA 200 mg Caps capsule Commonly known as: LAGEVRIO Take 4 capsules (800 mg total) by mouth 2 (two) times daily for 7 days.   montelukast 10 MG tablet Commonly known as: SINGULAIR Take 10 mg by mouth daily.   morphine 15 MG 12 hr tablet Commonly known as: MS CONTIN Take 1 tablet (15 mg total) by mouth every 12 (twelve) hours.   ondansetron 8 MG tablet Commonly known as: ZOFRAN Take 1 tablet (8 mg total) by mouth every 8 (eight) hours as needed for nausea or vomiting. Starting on day 3 after chemotherapy   Oxycodone HCl 10 MG Tabs Take 1 tablet (10 mg total) by mouth every 6 (six) hours as needed.   PRESCRIPTION MEDICATION Inject into the vein See admin instructions. FOLFIRINOX- Every 14  days for 8 treatments   prochlorperazine 10 MG tablet Commonly known as: COMPAZINE Take 1 tablet (10 mg total) by mouth every 6 (six) hours  as needed for vomiting or nausea.   promethazine 25 MG tablet Commonly known as: PHENERGAN Take 25 mg by mouth every 6 (six) hours as needed for vomiting or nausea.   VITAMIN B6 PO Take by mouth.        Follow-up Information     [name], MD .   Specialty: Family Medicine Contact information: [place] [address] [phone]         Truitt Merle, MD. Schedule an appointment as soon as possible for a visit .   Specialties: Hematology, Oncology Contact information: [place] [address] [phone]                   If you experience worsening of your admission symptoms, develop shortness of breath, life threatening emergency, suicidal or homicidal thoughts you must seek medical attention immediately by calling 911 or calling your MD immediately  if symptoms less severe.   You must read complete instructions/literature along with all the possible adverse reactions/side effects for all the medicines you take and that have been prescribed to you. Take any new medicines after you have completely understood and accept all the possible adverse reactions/side effects.    Please note   You were cared for by a hospitalist during your hospital stay. If you have any questions about your discharge medications or the care you received while you were in the hospital after you are discharged, you can call the unit and asked to speak with the hospitalist on call if the hospitalist that took care of you is not available. Once you are discharged, your primary care physician will handle any further medical issues. Please note that NO REFILLS for any discharge medications will be authorized once you are discharged, as it is imperative that you return to your primary care physician (or establish a  relationship with a primary care physician if you do not have one) for your aftercare needs so that they can reassess your need for medications and monitor your lab values.       Time coordinating discharge: 31 minutes  Signed:  Honestii Marton  [facility] 06/30/2021, 3:26 PM   Pager on [email or web address]. If 7PM-7AM, please contact night-coverage at [email or web address]

## 2021-06-30 NOTE — Progress Notes (Addendum)
[facility] Progress Note  CC:  Pancreatic cancer, elevated LFTs  Subjective:  Feeling much better today.  Eating well.  Pain is at baseline.  Not coughing much.  Objective:  Vital signs in last 24 hours: Temp:  [98.6 F (37 C)-99.7 F (37.6 C)] 98.9 F (37.2 C) (11/04 0502) Pulse Rate:  [76-91] 81 (11/04 0502) Resp:  [16-22] 18 (11/04 0502) BP: (114-128)/(65-80) 114/70 (11/04 0502) SpO2:  [99 %-100 %] 100 % (11/04 0502) Last BM Date: 06/29/21 General:  Alert, Well-developed, in NAD Heart:  Regular rate and rhythm; no murmurs Pulm:  CTAB.  No W/R/R. Abdomen:  Soft, non-distended.  BS present.  Baseline TTP in epigastrium and LUQ.  Extremities:  Without edema. Neurologic:  Alert and oriented x 4;  grossly normal neurologically. Psych:  Alert and cooperative. Normal mood and affect.  Lab Results: Recent Labs    06/28/21 2010 06/29/21 0655  WBC 3.2* 4.3  HGB 11.4* 10.4*  HCT 35.6* 31.8*  PLT 127* 125*   BMET Recent Labs    06/28/21 2010 06/29/21 0655  NA 134* 138  K 3.7 3.7  CL 103 107  CO2 23 24  GLUCOSE 122* 94  BUN 14 12  CREATININE 1.15* 0.97  CALCIUM 8.5* 8.5*   LFT Recent Labs    06/30/21 0448  PROT 6.1*  ALBUMIN 3.0*  AST 104*  ALT 192*  ALKPHOS 100  BILITOT 0.9  BILIDIR 0.1  IBILI 0.8   PT/INR Recent Labs    06/29/21 0655  LABPROT 15.1  INR 1.2   DG Chest 1 View  Result Date: 06/29/2021 CLINICAL DATA:  Syncope EXAM: CHEST  1 VIEW COMPARISON:  Chest x-ray 02/17/2021 FINDINGS: Heart size and mediastinal contours are within normal limits. No suspicious pulmonary opacities identified. Right-sided central venous port with the tip in the SVC. No pleural effusion or pneumothorax visualized. No acute osseous abnormality appreciated. IMPRESSION: No acute intrathoracic process identified. Electronically Signed   By: Ofilia Neas M.D.   On: 06/29/2021 07:30   CT ABDOMEN PELVIS W CONTRAST  Result Date: 06/28/2021 CLINICAL  DATA:  Abdominal pain, nausea, vomiting EXAM: CT ABDOMEN AND PELVIS WITH CONTRAST TECHNIQUE: Multidetector CT imaging of the abdomen and pelvis was performed using the standard protocol following bolus administration of intravenous contrast. CONTRAST:  50mL OMNIPAQUE IOHEXOL 350 MG/ML SOLN COMPARISON:  06/23/2021 FINDINGS: Lower chest: The visualized lung bases are clear. Visualized heart and pericardium are unremarkable. Asymmetric wall thickening within the tiny hiatal hernia appears stable since prior PET CT examination and was metabolically negative at that time. Hepatobiliary: Hemangioma within the right hepatic dome is unchanged. Multiple scattered hepatic cysts are again identified. Suspected metastatic lesion within the subserosal inferior right hepatic lobe is not well appreciated on this examination. Pneumobilia again noted. No intra or extrahepatic biliary ductal dilation. Palliative biliary stent noted within the extrahepatic bile duct. The gallbladder is unremarkable. Pancreas: Incomplete pancreatic divisum noted. The fullness of the pancreatic head previously noted is stable. Mild hypodensity surrounding the palliative biliary stent within the pancreatic head is nonspecific but appears new since prior examination, possibly representing the patient's primary mass or peribiliary edema. The pancreatic duct is not dilated. The remainder of the pancreatic parenchyma demonstrates normal enhancement. Spleen: Unremarkable Adrenals/Urinary Tract: Adrenal glands are unremarkable. Kidneys are normal, without renal calculi, focal lesion, or hydronephrosis. Bladder is unremarkable. Stomach/Bowel: The stomach, large bowel, and small bowel are unremarkable. Appendix normal. No free intraperitoneal gas or fluid. Vascular/Lymphatic: Aortic atherosclerosis.  No enlarged abdominal or pelvic lymph nodes. Reproductive: Status post hysterectomy. No adnexal masses. Other: No abdominal wall hernia. Musculoskeletal: No acute  bone abnormality. No lytic or blastic bone lesion. IMPRESSION: No acute intra-abdominal pathology identified. No definite radiographic explanation for the patient's reported symptoms. Palliative biliary stent unchanged within the distal common duct. Interval development of mild peribiliary hypoattenuation within the pancreatic head which may represent the patient's primary mass or peribiliary edema, but is nonspecific. Known hepatic metastasis is not well visualized on this examination. Aortic Atherosclerosis (ICD10-I70.0). Electronically Signed   By: Fidela Salisbury M.D.   On: 06/28/2021 22:48   US Abdomen Limited  Result Date: 06/29/2021 CLINICAL DATA:  Elevated LFTs, pancreatic cancer, biliary stent in place EXAM: ULTRASOUND ABDOMEN LIMITED RIGHT UPPER QUADRANT COMPARISON:  CT abdomen/pelvis dated 06/28/2021 FINDINGS: Gallbladder: Mild gallbladder wall thickening/edema. No cholelithiasis, gallbladder distention, or pericholecystic fluid. Negative sonographic Murphy's sign. Common bile duct: Diameter: 3 mm.  Indwelling common duct stent. Liver: 2.5 x 2.43.5 cm hyperechoic lesion in the right hepatic lobe, likely corresponding to the patient's known benign hemangioma. 10 x 9 x 10 mm cyst in the right hepatic lobe. Additional lesions are better evaluated on prior CTs. Pneumobilia. Portal vein is patent on color Doppler imaging with normal direction of blood flow towards the liver. Other: None. IMPRESSION: Indwelling common duct stent. Benign hemangioma and hepatic cyst. Additional hepatic lesions are better evaluated on prior CTs. Mild gallbladder wall thickening/edema, without sonographic findings to suggest acute cholecystitis. Electronically Signed   By: Julian Hy M.D.   On: 06/29/2021 01:08   ECHOCARDIOGRAM COMPLETE  Result Date: 06/29/2021    ECHOCARDIOGRAM REPORT   Patient Name:   Robin [name] Date of Exam: 06/29/2021 Medical Rec #:  [id number]       Height:       69.0 in Accession #:     [id number]      Weight:       200.0 lb Date of Birth:  1966-03-28      BSA:          2.066 m Patient Age:    55 years        BP:           122/69 mmHg Patient Gender: F               HR:           90 bpm. Exam Location:  Inpatient Procedure: 2D Echo, Cardiac Doppler and Color Doppler Indications:    R06.9 DOE  History:        Patient has prior history of Echocardiogram examinations, most                 recent 07/22/2017. Signs/Symptoms:Syncope and Dyspnea.  Sonographer:    Glo Herring Referring Phys: [id number] [place]  1. Left ventricular ejection fraction, by estimation, is 55 to 60%. The left ventricle has normal function. The left ventricle has no regional wall motion abnormalities. Left ventricular diastolic parameters were normal.  2. Right ventricular systolic function is normal. The right ventricular size is normal. There is normal pulmonary artery systolic pressure. The estimated right ventricular systolic pressure is 38.1 mmHg.  3. The mitral valve is normal in structure. Trivial mitral valve regurgitation. No evidence of mitral stenosis.  4. The aortic valve is tricuspid. Aortic valve regurgitation is trivial. No aortic stenosis is present.  5. The inferior vena cava is dilated in size with >50% respiratory variability, suggesting right  atrial pressure of 8 mmHg. FINDINGS  Left Ventricle: Left ventricular ejection fraction, by estimation, is 55 to 60%. The left ventricle has normal function. The left ventricle has no regional wall motion abnormalities. The left ventricular internal cavity size was normal in size. There is  no left ventricular hypertrophy. Left ventricular diastolic parameters were normal. Right Ventricle: The right ventricular size is normal. No increase in right ventricular wall thickness. Right ventricular systolic function is normal. There is normal pulmonary artery systolic pressure. The tricuspid regurgitant velocity is 2.14 m/s, and  with an assumed right  atrial pressure of 8 mmHg, the estimated right ventricular systolic pressure is 13.2 mmHg. Left Atrium: Left atrial size was normal in size. Right Atrium: Right atrial size was normal in size. Pericardium: There is no evidence of pericardial effusion. Mitral Valve: The mitral valve is normal in structure. Trivial mitral valve regurgitation. No evidence of mitral valve stenosis. MV peak gradient, 4.2 mmHg. The mean mitral valve gradient is 2.0 mmHg. Tricuspid Valve: The tricuspid valve is normal in structure. Tricuspid valve regurgitation is trivial. Aortic Valve: The aortic valve is tricuspid. Aortic valve regurgitation is trivial. No aortic stenosis is present. Aortic valve mean gradient measures 4.0 mmHg. Aortic valve peak gradient measures 7.2 mmHg. Aortic valve area, by VTI measures 1.81 cm. Pulmonic Valve: The pulmonic valve was normal in structure. Pulmonic valve regurgitation is trivial. Aorta: The aortic root is normal in size and structure. Venous: The inferior vena cava is dilated in size with greater than 50% respiratory variability, suggesting right atrial pressure of 8 mmHg. IAS/Shunts: No atrial level shunt detected by color flow Doppler.  LEFT VENTRICLE PLAX 2D LVIDd:         4.60 cm   Diastology LVIDs:         3.25 cm   LV e' medial:    11.50 cm/s LV PW:         0.95 cm   LV E/e' medial:  7.6 LV IVS:        0.95 cm   LV e' lateral:   12.70 cm/s LVOT diam:     2.00 cm   LV E/e' lateral: 6.9 LV SV:         49 LV SV Index:   24 LVOT Area:     3.14 cm  RIGHT VENTRICLE             IVC RV Basal diam:  4.00 cm     IVC diam: 2.20 cm RV S prime:     11.00 cm/s LEFT ATRIUM             Index        RIGHT ATRIUM           Index LA diam:        3.60 cm 1.74 cm/m   RA Area:     14.50 cm LA Vol (A2C):   62.4 ml 30.20 ml/m  RA Volume:   32.50 ml  15.73 ml/m LA Vol (A4C):   55.4 ml 26.82 ml/m LA Biplane Vol: 58.0 ml 28.07 ml/m  AORTIC VALVE                    PULMONIC VALVE AV Area (Vmax):    2.04 cm     PV  Vmax:       0.95 m/s AV Area (Vmean):   1.87 cm     PV Peak grad:  3.6 mmHg AV Area (VTI):  1.81 cm AV Vmax:           134.00 cm/s AV Vmean:          95.600 cm/s AV VTI:            0.271 m AV Peak Grad:      7.2 mmHg AV Mean Grad:      4.0 mmHg LVOT Vmax:         87.00 cm/s LVOT Vmean:        57.000 cm/s LVOT VTI:          0.156 m LVOT/AV VTI ratio: 0.58  AORTA Ao Root diam: 2.75 cm Ao Asc diam:  2.80 cm MITRAL VALVE               TRICUSPID VALVE MV Area (PHT): 3.66 cm    TR Peak grad:   18.3 mmHg MV Area VTI:   2.05 cm    TR Vmax:        214.00 cm/s MV Peak grad:  4.2 mmHg MV Mean grad:  2.0 mmHg    SHUNTS MV Vmax:       1.02 m/s    Systemic VTI:  0.16 m MV Vmean:      73.8 cm/s   Systemic Diam: 2.00 cm MV Decel Time: 207 msec MV E velocity: 87.80 cm/s MV A velocity: 64.70 cm/s MV E/A ratio:  1.36 Dalton McleanMD Electronically signed by Franki Monte Signature Date/Time: 06/29/2021/4:18:14 PM    Final     Assessment / Plan: *Pancreatic adenocarcinoma status post metal stent placement on 02/09/2021.  Follows with [name], treated with FOLFIRINOX.  Presented with syncope, epigastric abdominal pain and acute elevation in her LFTs as compared to 7 days ago.  Her ultrasound shows mild gallbladder wall thickening.  Her pain is more in the epigastrium and on the left.  Not sure what is causing her LFTs.  Could be cystic duct obstruction in the setting of SEMS but could also be covid related, ? Chemo induced.  ? Some ischemic hepatitis from her hypotension/syncope.  LFTs still tending down more today.  AST 104, ALT 192, ALP 100, and total bili 0.9.  Feeling much better and eating well. *Covid positive  -Ok for discharge from a GI standpoint.  -Close follow-up with oncology who can follow her LFTs and notify us of any ongoing issues.    LOS: 0 days   Laban Emperor. Zehr  06/30/2021, 9:29 AM     Attending Physician Note   I have taken an interval history, reviewed the chart and examined the patient. I  personally saw the patient and performed a substantive portion of this encounter, including a complete performance of at least one of the key components, in conjunction with the APP. I agree with the APP's note, impression and recommendations.   Pancreatic adenocarcinoma receiving FOLFIRINOX with a biliary FCSEMS in place. Abdominal pain resolved and LFTs improved, etiology unclear. Possibly Covid or chemo related. Possibly mild shock liver. This does not appear to be gallbladder or biliary stent related. No plans for further GI evaluation at this time. OK for discharge from GI standpoint. Monitor LFTs as outpatient per [name]. GI signing off. GI follow up as outpatient as needed.   Lucio Edward, MD [facility] See AM[facility], [facility], for our on call provider

## 2021-07-04 ENCOUNTER — Encounter: Payer: Self-pay | Admitting: Genetic Counselor

## 2021-07-04 ENCOUNTER — Ambulatory Visit: Payer: Self-pay | Admitting: Genetic Counselor

## 2021-07-04 ENCOUNTER — Telehealth: Payer: Self-pay | Admitting: Genetic Counselor

## 2021-07-04 DIAGNOSIS — Z1379 Encounter for other screening for genetic and chromosomal anomalies: Secondary | ICD-10-CM | POA: Insufficient documentation

## 2021-07-04 DIAGNOSIS — C250 Malignant neoplasm of head of pancreas: Secondary | ICD-10-CM

## 2021-07-04 DIAGNOSIS — Z803 Family history of malignant neoplasm of breast: Secondary | ICD-10-CM

## 2021-07-04 NOTE — Telephone Encounter (Signed)
Revealed negative genetic testing.  Discussed that we do not know why she has pancreatic cancer or why there is cancer in the family. It could be sporadic/familial, due to a different gene that we are not testing, or maybe our current technology may not be able to pick something up.  It will be important for her to keep in contact with genetics to keep up with whether additional testing may be needed.

## 2021-07-04 NOTE — Progress Notes (Signed)
HPI:   Robin [name] was previously seen in the [facility] due to a personal and family history of cancer and concerns regarding a hereditary predisposition to cancer. Please refer to our prior cancer genetics clinic note for more information regarding our discussion, assessment and recommendations, at the time. Robin [name] recent genetic test results were disclosed to her, as were recommendations warranted by these results. These results and recommendations are discussed in more detail below.  CANCER HISTORY:  Oncology History Overview Note  Cancer Staging Pancreatic cancer [facility]) Staging form: Exocrine Pancreas, AJCC 8th Edition - Clinical stage from 02/09/2021: Stage IV (cT2, cN0, cM1) - Signed by Truitt Merle, MD on 03/16/2021 Total positive nodes: 0    Pancreatic cancer ([place])  01/11/2021 Imaging   CT Abdomen/Pelvis  IMPRESSION: Multiple low-attenuation liver lesions, which cannot be characterized on this unenhanced exam. Abdomen MRI without and with contrast is recommended for further characterization.   Diffuse biliary ductal dilatation, with soft tissue prominence in the region of the pancreatic head. Pancreatic mass cannot be excluded on this unenhanced exam. Recommend abdomen MRI and MRCP without and with contrast for further evaluation.   Colonic diverticulosis, without radiographic evidence of diverticulitis.     01/12/2021 Imaging   MRI ABDOMEN WITHOUT AND WITH CONTRAST (INCLUDING MRCP)  IMPRESSION: Biliary duct distension of both intra and extrahepatic biliary tree with abrupt caliber change but with no visible mass on imaging. Caliber change more abrupt than expected for stricture and the biliary tree was top-normal size at 6 mm in 2017. Endoscopic assessment is suggested to exclude an occult biliary lesion or atypical stricture.   Lobulated T2 bright lesion in the medial segment of the LEFT hepatic lobe that favors a sclerosed hemangioma. There is  some internal septation suggested and the filling pattern is slightly atypical. Consider 3 to six-month follow-up with extended delays following contrast to 7 minutes better assess filling characteristics, differential consideration would include a biliary cystadenoma with septal enhancement.   01/13/2021 Procedure   ERCP under the care of Dr. Carlean Purl  IMPRESSION: 1. Dilation of the common bile duct and main pancreatic duct. 2. Sphincterotomy and placement of a plastic biliary stent.    01/13/2021 Pathology Results   CASE: [id number]   A. COMMON BILE DUCT, STRICTURE, #1, BRUSHING:   FINAL MICROSCOPIC DIAGNOSIS:  - Suspicious for malignancy  - See comment    02/08/2021 Imaging   CT CHEST WITHOUT CONTRAST AND CT OF ABDOMEN AND PELVIS WITH CONTRAST  IMPRESSION: 1. Findings are highly suspicious for a hypovascular mass in the posterior aspect of the head of the pancreas, as detailed above. This appears associated with persistent narrowing of the distal common bile duct and abrupt termination of the pancreatic duct. Common bile duct stent appears appropriately located, but is associated with some persistent dilatation of the common bile duct. 2. 1.2 x 0.7 cm ground-glass attenuation nodule in the right upper lobe. Initial follow-up with CT at 6-12 months is recommended to confirm persistence. If persistent, repeat CT is recommended every 2 years until 5 years of stability has been established. This recommendation follows the consensus statement: Guidelines for Management of Incidental Pulmonary Nodules Detected on CT Images: From the Fleischner Society 2017; Radiology 2017; 284:228-243. 3. Multiple liver lesions, smallest of which likely represent tiny simple cysts. The larger lesion in the left lobe of the liver has imaging characteristics once again most suggestive of a cavernous hemangioma. 4. Aortic atherosclerosis, in addition to left anterior descending coronary  artery  disease. Please note that although the presence of coronary artery calcium documents the presence of coronary artery disease, the severity of this disease and any potential stenosis cannot be assessed on this non-gated CT examination. Assessment for potential risk factor modification, dietary therapy or pharmacologic therapy may be warranted, if clinically indicated. 5. Additional incidental findings, as above.    02/09/2021 Procedure   ERCP under the care of Dr. Rush Landmark   IMPRESSION: Limited images during ERCP demonstrates removal plastic biliary stent and placement of a new metallic biliary stent. Please refer to the dictated operative report for full details of intraoperative findings and procedure.   02/09/2021 Pathology Results   CASE: [id number]  FINAL MICROSCOPIC DIAGNOSIS:  A. PANCREAS, HEAD OF LESION, FINE NEEDLE ASPIRATION:  - Malignant cells consistent with adenocarcinoma    02/09/2021 Procedure   EGD Impression: - No gross lesions in esophagus. Z-line regular, 40 cm from the incisors. - Severe gastritis noted. No other gross lesions in the stomach. Biopsied. - Plastic biliary stent in the duodenum. Removed. Revealed a patent biliary sphincterotomy was found. - No gross lesions in the duodenal bulb, in the first portion of the duodenum and in the second portion of the duodenum. EUS Impression: - Hyperechoic material consistent with sludge was visualized endosonographically in the gallbladder. - There was a suggestion of a stricture in the lower third of the main bile duct leading to dilation in the middle third of the main bile duct and in the upper third of the main bile duct with evidence of hyperechoic material consistent with sludge within the common bile duct. - A mass-like region was identified in the pancreatic head where the CBD narrowed and where the upstream PD dilation was noted. This is not the most confluent of masses however. Cytology results are  pending. However, the endosonographic appearance is suggestive of potential pancreatic adenocarcinoma. This was staged T2 N0 Mx by endosonographic criteria. The staging applies if malignancy is confirmed. Fine needle biopsy performed. - No malignant-appearing lymph nodes were visualized in the celiac region (level 20), peripancreatic region and porta hepatis region.   02/09/2021 Cancer Staging   Staging form: Exocrine Pancreas, AJCC 8th Edition - Clinical stage from 02/09/2021: Stage IV (cT2, cN0, cM1) - Signed by Truitt Merle, MD on 03/16/2021 Total positive nodes: 0    02/13/2021 Initial Diagnosis   Pancreatic cancer ([place])   02/28/2021 Imaging   PET  Signs of pancreatic head mass with suspected hepatic metastatic lesion in the RIGHT hepatic lobe as described.   Equivocal uptake in adjacent lymph nodes, potentially reactive in the setting of biliary stent. Note the biliary stent placement is in the mid to distal common bile duct but appears similar to recent ERCP images.   Stable appearance of ground-glass in the RIGHT upper lobe. This does not display increased FDG uptake and would be atypical for metastatic lesion based on appearance and location. Suggest six-month follow-up for further evaluation.   RIGHT thyroid uptake is slightly asymmetric potentially associated with small nodule. Recommend thyroid ultrasound with biopsy of focal abnormality is seen.(Ref: J Am Coll Radiol. 2015 Feb;12(2): 143-50).   03/02/2021 -  Chemotherapy   Patient is on Treatment Plan : PANCREAS Modified FOLFIRINOX q14d x 4 cycles     04/25/2021 Imaging   CT AP w contrast IMPRESSION: 1. No evidence of pancreatic carcinoma progression by CT imaging. 2. Hypermetabolic lesions on comparison PET scan within the head of the pancreas and liver were essentially occult by CT  imaging. Consider follow-up FDG PET scan at some future point. 3. Multiple small benign-appearing hypodense lesions in the liver and benign  hepatic hemangioma unchanged from prior. 4. No evidence of lymphadenopathy   07/01/2021 Genetic Testing   Negative hereditary cancer genetic testing: no pathogenic variants detected in [facility] CustomNext-Cancer +RNAinsight Panel.  The report date is July 01, 2021.   The CustomNext-Cancer +RNAinsight Panel offered by [facility] and includes sequencing and rearrangement analysis for the following 91 genes: AIP, ALK, APC, ATM, AXIN2, BAP1, BARD1, BLM, BMPR1A, BRCA1, BRCA2, BRIP1, CDC73, CDH1, CDK4, CDKN1B, CDKN2A, CHEK2, CTNNA1, DICER1, FANCC, FH, FLCN, GALNT12, KIF1B, LZTR1, MAX, MEN1, MET, MLH1, MRE11A, MSH2, MSH3, MSH6, MUTYH, NBN, NF1, NF2, NTHL1, PALB2, PHOX2B, PMS2, POT1, PRKAR1A, PTCH1, PTEN, RAD50, RAD51C, RAD51D, RB1, RECQL, RET, SDHA, SDHAF2, SDHB, SDHC, SDHD, SMAD4, SMARCA4, SMARCB1, SMARCE1, STK11, SUFU, TMEM127, TP53, TSC1, TSC2, VHL and XRCC2 (sequencing and deletion/duplication); CASR, CFTR, CPA1, CTRC, EGFR, EGLN1, FAM175A, HOXB13, KIT, MITF, MLH3, PALLD, PDGFRA, POLD1, POLE, PRSS1, RINT1, RPS20, SPINK1 and TERT (sequencing only); EPCAM and GREM1 (deletion/duplication only). RNA data is routinely analyzed for use in variant interpretation for all genes.     FAMILY HISTORY:  We obtained a detailed, 4-generation family history.  Significant diagnoses are listed below: Family History  Problem Relation Age of Onset   Cancer Father          ? prostate or colon; dx after 37   Breast cancer Maternal Grandmother          dx after 14   Breast cancer Half-Sister 57   Breast cancer Half-Sister          paternal half sister; dx late 60s-early 60s   Breast cancer Maternal Great-grandmother          MGM's mother; dx after 52      Ms. Kreischer is unaware of previous family history of genetic testing for hereditary cancer risks. There is no reported Ashkenazi Jewish ancestry. There is no known consanguinity.    GENETIC TEST RESULTS:  The [facility] CustomNext-Cancer +RNAinsight Panel found no  pathogenic mutations. The CustomNext-Cancer +RNAinsight Panel offered by [facility] and includes sequencing and rearrangement analysis for the following 91 genes: AIP, ALK, APC, ATM, AXIN2, BAP1, BARD1, BLM, BMPR1A, BRCA1, BRCA2, BRIP1, CDC73, CDH1, CDK4, CDKN1B, CDKN2A, CHEK2, CTNNA1, DICER1, FANCC, FH, FLCN, GALNT12, KIF1B, LZTR1, MAX, MEN1, MET, MLH1, MRE11A, MSH2, MSH3, MSH6, MUTYH, NBN, NF1, NF2, NTHL1, PALB2, PHOX2B, PMS2, POT1, PRKAR1A, PTCH1, PTEN, RAD50, RAD51C, RAD51D, RB1, RECQL, RET, SDHA, SDHAF2, SDHB, SDHC, SDHD, SMAD4, SMARCA4, SMARCB1, SMARCE1, STK11, SUFU, TMEM127, TP53, TSC1, TSC2, VHL and XRCC2 (sequencing and deletion/duplication); CASR, CFTR, CPA1, CTRC, EGFR, EGLN1, FAM175A, HOXB13, KIT, MITF, MLH3, PALLD, PDGFRA, POLD1, POLE, PRSS1, RINT1, RPS20, SPINK1 and TERT (sequencing only); EPCAM and GREM1 (deletion/duplication only). RNA data is routinely analyzed for use in variant interpretation for all genes.  The test report has been scanned into EPIC and is located under the Molecular Pathology section of the Results Review tab.  A portion of the result report is included below for reference. Genetic testing reported out on July 01, 2021.      Even though a pathogenic variant was not identified, possible explanations for the cancer in the family may include: There may be no hereditary risk for cancer in the family. The cancers in Ms. Maxfield and/or her family may be sporadic/familial or due to other genetic and environmental factors. There may be a gene mutation in one of these genes that current testing  methods cannot detect but that chance is small. There could be another gene that has not yet been discovered, or that we have not yet tested, that is responsible for the cancer diagnoses in the family.  It is also possible there is a hereditary cause for the cancer in the family that Ms. Daidone did not inherit.   Therefore, it is important to remain in touch with cancer  genetics in the future so that we can continue to offer Ms. Kithcart the most up to date genetic testing.   ADDITIONAL GENETIC TESTING:  We discussed with Ms. Basham that her genetic testing was fairly extensive.  If there are genes identified to increase cancer risk that can be analyzed in the future, we would be happy to discuss and coordinate this testing at that time.    CANCER SCREENING RECOMMENDATIONS:  Ms. Lich test result is considered negative (normal).  This means that we have not identified a hereditary cause for her personal history of cancer at this time.   An individual's cancer risk and medical management are not determined by genetic test results alone. Overall cancer risk assessment incorporates additional factors, including personal medical history, family history, and any available genetic information that may result in a personalized plan for cancer prevention and surveillance. Therefore, it is recommended she continue to follow the cancer management and screening guidelines provided by her oncology and primary healthcare provider.  RECOMMENDATIONS FOR FAMILY MEMBERS:   Since she did not inherit a identifiable mutation in a cancer predisposition gene included on this panel, her children could not have inherited a known mutation from her in one of these genes. Individuals in this family might be at some increased risk of developing cancer, over the general population risk, due to the family history of cancer.  Individuals in the family should notify their providers of the family history of cancer. We recommend women in this family have a yearly mammogram beginning at age 27, or 73 years younger than the earliest onset of cancer, an annual clinical breast exam, and perform monthly breast self-exams.  Family members should have colonoscopies by at age 12, or earlier, as recommended by their providers.  Other members of the family may still carry a pathogenic variant in one of these  genes that Ms. Coley did not inherit. Based on the family history, we recommend her paternal half sister, who was diagnosed with breast cancer in her 96s, have genetic counseling and testing. Ms. Putman will let us know if we can be of any assistance in coordinating genetic counseling and/or testing for this family member.    FOLLOW-UP:  Lastly, we discussed with Ms. Lohse that cancer genetics is a rapidly advancing field and it is possible that new genetic tests will be appropriate for her and/or her family members in the future. We encouraged her to remain in contact with cancer genetics on an annual basis so we can update her personal and family histories and let her know of advances in cancer genetics that may benefit this family.   Our contact number was provided. Ms. Metsker questions were answered to her satisfaction, and she knows she is welcome to call us at anytime with additional questions or concerns.   Indra Wolters M. Joette Catching, [place], [facility] Genetic Counselor [email or web address] (P) [phone]

## 2021-07-05 ENCOUNTER — Ambulatory Visit: Payer: 59

## 2021-07-05 ENCOUNTER — Other Ambulatory Visit: Payer: 59

## 2021-07-05 ENCOUNTER — Ambulatory Visit: Payer: 59 | Admitting: Nurse Practitioner

## 2021-07-10 ENCOUNTER — Other Ambulatory Visit: Payer: Self-pay | Admitting: Hematology

## 2021-07-10 MED ORDER — MORPHINE SULFATE ER 15 MG PO TBCR: 15.0000 mg | EXTENDED_RELEASE_TABLET | Freq: Two times a day (BID) | ORAL | 0 refills | Status: DC

## 2021-07-12 ENCOUNTER — Other Ambulatory Visit: Payer: 59

## 2021-07-12 ENCOUNTER — Inpatient Hospital Stay: Payer: 59 | Attending: Physician Assistant | Admitting: Hematology

## 2021-07-12 ENCOUNTER — Inpatient Hospital Stay: Payer: 59

## 2021-07-12 ENCOUNTER — Encounter: Payer: Self-pay | Admitting: Hematology

## 2021-07-12 DIAGNOSIS — G620 Drug-induced polyneuropathy: Secondary | ICD-10-CM | POA: Insufficient documentation

## 2021-07-12 DIAGNOSIS — Z79899 Other long term (current) drug therapy: Secondary | ICD-10-CM | POA: Insufficient documentation

## 2021-07-12 DIAGNOSIS — Z5111 Encounter for antineoplastic chemotherapy: Secondary | ICD-10-CM | POA: Insufficient documentation

## 2021-07-12 DIAGNOSIS — C250 Malignant neoplasm of head of pancreas: Secondary | ICD-10-CM | POA: Diagnosis not present

## 2021-07-12 DIAGNOSIS — C787 Secondary malignant neoplasm of liver and intrahepatic bile duct: Secondary | ICD-10-CM | POA: Insufficient documentation

## 2021-07-12 DIAGNOSIS — T451X5A Adverse effect of antineoplastic and immunosuppressive drugs, initial encounter: Secondary | ICD-10-CM | POA: Insufficient documentation

## 2021-07-12 DIAGNOSIS — Z803 Family history of malignant neoplasm of breast: Secondary | ICD-10-CM | POA: Insufficient documentation

## 2021-07-12 MED ORDER — DICYCLOMINE HCL 10 MG PO CAPS
10.0000 mg | ORAL_CAPSULE | Freq: Three times a day (TID) | ORAL | 1 refills | Status: DC
Start: 2021-07-12 — End: 2023-02-20

## 2021-07-12 NOTE — Progress Notes (Signed)
[place]   [phone] [phone]   Clinic Follow up Note   Patient Care Team: [name], MD as PCP - General (Family Medicine) Jerline Pain, MD as PCP - Cardiology (Cardiology) End, Harrell Gave, MD as Consulting Physician (Cardiology) Alda Berthold, DO as Consulting Physician (Neurology) Heilingoetter, Tobe Sos, PA-C as Physician Assistant (Physician Assistant) Truitt Merle, MD as Consulting Physician (Oncology) Dwan Bolt, MD as Consulting Physician (General Surgery)  Date of Service:  07/12/2021  I connected with [name] on 07/12/2021 at 11:00 AM EST by telephone visit and verified that I am speaking with the correct person using two identifiers.  I discussed the limitations, risks, security and privacy concerns of performing an evaluation and management service by telephone and the availability of in person appointments. I also discussed with the patient that there may be a patient responsible charge related to this service. The patient expressed understanding and agreed to proceed.   Other persons participating in the visit and their role in the encounter:  None   Patient's location:  work  Provider's location:  my office  CHIEF COMPLAINT: f/u of metastatic pancreatic cancer  CURRENT THERAPY:  First line FOLFIRINOX q2 weeks, starting 03/02/21, OXali held with C3 due to neuropathy. Dose reduced to 70 mg/m2 added back with C4, further reduced to 68m/m2 from cycle 7 due to tolerance issue  ASSESSMENT & PLAN:  Robin CARAWAYis a 55y.o. female with   1. Recent hospital admission for syncope and COVID (+) -She was admitted on June 28, 2021 after syncope episode, presumably secondary to her dehydration.  -She was found to be positive for COVID, with mild symptoms, treated  -She was also seen by GI for elevated liver enzymes, CT abdomen pelvis with contrast was unremarkable, stent in good position, no intervention  recommended  -she has recovered well    2. Adenocarcinoma of the pancreas, cT2N0M1 with probable oligo liver met  -Presented with fatigue, abdominal pain, and jaundice 12/2020 s/p biliary stent x2. CT 02/08/21 showed 2.5 cm hypervascular area in pancreatic head adjacent to the common bile duct in direct contact with anterior surface of inferior vena cava, 1.2 cm groundglass right lung nodule, and multiple liver lesions -Baseline CA 19.9 elevated at 72 -EUS showed a masslike region in the pancreatic head where the CBD narrowed with upstream PD dilatation noted.  Endosonographic appearance was suggestive of potential pancreatic adenocarcinoma T2 N0 MX.  Final path showed malignant cells consistent with adenocarcinoma - PET scan showed a hypermetabolic liver lesion in the right lobe, adjacent to right kidney, suspicious for oligo liver metastasis. No other definitive distant mets. This was not visible on ultrasound, Liver biopsy is not feasible   -She began C1 FOLFIRINOX 03/02/21. Her insurance denied prophylactic G-CSF. S/p cycle 2 she began to develop early neuropathy, oxali held with cycle 3. s/p cycle 3 she developed acute abd pain and nausea, with elevated lipase, concerning for acute pancreatitis. She has was hospitalized for hydration and pain management.  -She recovered well, neuropathy resolved.  Able to resume chemo with cycle 4 FOLFIRINOX and dose reduced oxaliplatin on 04/13/21 -CA 19-9 improving on chemo.  Overall consistent with a good treatment response. -she has been experiencing increased pain to her back. Restaging CT CAP on 06/23/21 was stable with no new metastatic disease. -plan to restart chemo next week    3. 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 -decreased sensation in hands, more so on right -  She started B complex and oxalic was held with cycle 3 -Neuropathy resolved, added Oxali back 70 mg/m2 with cycle 4 -She continues to have neuropathy/cold sensitivity after chemo, which  she notes lasts longer with each cycle. I reduced her Oxali dose with C7 (06/08/21)   4. Family history for cancer  -Maternal great-grandmother, grandmother, and 2 sisters with breast cancer -The patient has 1 biological daughter -her genetic testing labs were drawn 06/22/21. Results were negative.   5. 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    SUMMARY OF ONCOLOGIC HISTORY: Oncology History Overview Note  Cancer Staging Pancreatic cancer [facility]) Staging form: Exocrine Pancreas, AJCC 8th Edition - Clinical stage from 02/09/2021: Stage IV (cT2, cN0, cM1) - Signed by Truitt Merle, MD on 03/16/2021 Total positive nodes: 0    Pancreatic cancer ([place])  01/11/2021 Imaging   CT Abdomen/Pelvis  IMPRESSION: Multiple low-attenuation liver lesions, which cannot be characterized on this unenhanced exam. Abdomen MRI without and with contrast is recommended for further characterization.   Diffuse biliary ductal dilatation, with soft tissue prominence in the region of the pancreatic head. Pancreatic mass cannot be excluded on this unenhanced exam. Recommend abdomen MRI and MRCP without and with contrast for further evaluation.   Colonic diverticulosis, without radiographic evidence of diverticulitis.     01/12/2021 Imaging   MRI ABDOMEN WITHOUT AND WITH CONTRAST (INCLUDING MRCP)  IMPRESSION: Biliary duct distension of both intra and extrahepatic biliary tree with abrupt caliber change but with no visible mass on imaging. Caliber change more abrupt than expected for stricture and the biliary  tree was top-normal size at 6 mm in 2017. Endoscopic assessment is suggested to exclude an occult biliary lesion or atypical stricture.   Lobulated T2 bright lesion in the medial segment of the LEFT hepatic lobe that favors a sclerosed hemangioma. There is some internal septation suggested and the filling pattern is slightly atypical. Consider 3 to six-month follow-up with extended delays following contrast to 7 minutes better assess filling characteristics, differential consideration would include a biliary cystadenoma with septal enhancement.   01/13/2021 Procedure   ERCP under the care of Dr. Carlean Purl  IMPRESSION: 1. Dilation of the common bile duct and main pancreatic duct. 2. Sphincterotomy and placement of a plastic biliary stent.    01/13/2021 Pathology Results   CASE: [id number]   A. COMMON BILE DUCT, STRICTURE, #1, BRUSHING:   FINAL MICROSCOPIC DIAGNOSIS:  - Suspicious for malignancy  - See comment    02/08/2021 Imaging   CT CHEST WITHOUT CONTRAST AND CT OF ABDOMEN AND PELVIS WITH CONTRAST  IMPRESSION: 1. Findings are highly suspicious for a hypovascular mass in the posterior aspect of the head of the pancreas, as detailed above. This appears associated with persistent narrowing of the distal common bile duct and abrupt termination of the pancreatic duct. Common bile duct stent appears appropriately located, but is associated with some persistent dilatation of the common bile duct. 2. 1.2 x 0.7 cm ground-glass attenuation nodule in the right upper lobe. Initial follow-up with CT at  6-12 months is recommended to confirm persistence. If persistent, repeat CT is recommended every 2 years until 5 years of stability has been established. This recommendation follows the consensus statement: Guidelines for Management of Incidental Pulmonary Nodules Detected on CT Images: From the Fleischner Society 2017; Radiology 2017; 284:228-243. 3. Multiple liver lesions, smallest of  which likely represent tiny simple cysts. The larger lesion in the left lobe of the liver has imaging characteristics once again most suggestive of a cavernous hemangioma. 4. Aortic atherosclerosis, in addition to left anterior descending coronary artery disease. Please note that although the presence of coronary artery calcium documents the presence of coronary artery disease, the severity of this disease and any potential stenosis cannot be assessed on this non-gated CT examination. Assessment for potential risk factor modification, dietary therapy or pharmacologic therapy may be warranted, if clinically indicated. 5. Additional incidental findings, as above.    02/09/2021 Procedure   ERCP under the care of Dr. Mansouraty   IMPRESSION: Limited images during ERCP demonstrates removal plastic biliary stent and placement of a new metallic biliary stent. Please refer to the dictated operative report for full details of intraoperative findings and procedure.   02/09/2021 Pathology Results   CASE: [id number]  FINAL MICROSCOPIC DIAGNOSIS:  A. PANCREAS, HEAD OF LESION, FINE NEEDLE ASPIRATION:  - Malignant cells consistent with adenocarcinoma    02/09/2021 Procedure   EGD Impression: - No gross lesions in esophagus. Z-line regular, 40 cm from the incisors. - Severe gastritis noted. No other gross lesions in the stomach. Biopsied. - Plastic biliary stent in the duodenum. Removed. Revealed a patent biliary sphincterotomy was found. - No gross lesions in the duodenal bulb, in the first portion of the duodenum and in the second portion of the duodenum. EUS Impression: - Hyperechoic material consistent with sludge was visualized endosonographically in the gallbladder. - There was a suggestion of a stricture in the lower third of the main bile duct leading to dilation in the middle third of the main bile duct and in the upper third of the main bile duct with evidence of hyperechoic  material consistent with sludge within the common bile duct. - A mass-like region was identified in the pancreatic head where the CBD narrowed and where the upstream PD dilation was noted. This is not the most confluent of masses however. Cytology results are pending. However, the endosonographic appearance is suggestive of potential pancreatic adenocarcinoma. This was staged T2 N0 Mx by endosonographic criteria. The staging applies if malignancy is confirmed. Fine needle biopsy performed. - No malignant-appearing lymph nodes were visualized in the celiac region (level 20), peripancreatic region and porta hepatis region.   02/09/2021 Cancer Staging   Staging form: Exocrine Pancreas, AJCC 8th Edition - Clinical stage from 02/09/2021: Stage IV (cT2, cN0, cM1) - Signed by , , MD on 03/16/2021 Total positive nodes: 0    02/13/2021 Initial Diagnosis   Pancreatic cancer (HCC)   02/28/2021 Imaging   PET  Signs of pancreatic head mass with suspected hepatic metastatic lesion in the RIGHT hepatic lobe as described.   Equivocal uptake in adjacent lymph nodes, potentially reactive in the setting of biliary stent. Note the biliary stent placement is in the mid to distal common bile duct but appears similar to recent ERCP images.   Stable appearance of ground-glass in the RIGHT upper lobe. This does not display increased FDG uptake and would be atypical for metastatic lesion based on appearance and location. Suggest six-month follow-up for further evaluation.     RIGHT thyroid uptake is slightly asymmetric potentially associated with small nodule. Recommend thyroid ultrasound with biopsy of focal abnormality is seen.(Ref: J Am Coll Radiol. 2015 Feb;12(2): 143-50).   03/02/2021 -  Chemotherapy   Patient is on Treatment Plan : PANCREAS Modified FOLFIRINOX q14d x 4 cycles     04/25/2021 Imaging   CT AP w contrast IMPRESSION: 1. No evidence of pancreatic carcinoma progression by CT imaging. 2.  Hypermetabolic lesions on comparison PET scan within the head of the pancreas and liver were essentially occult by CT imaging. Consider follow-up FDG PET scan at some future point. 3. Multiple small benign-appearing hypodense lesions in the liver and benign hepatic hemangioma unchanged from prior. 4. No evidence of lymphadenopathy   07/01/2021 Genetic Testing   Negative hereditary cancer genetic testing: no pathogenic variants detected in [facility] CustomNext-Cancer +RNAinsight Panel.  The report date is July 01, 2021.   The CustomNext-Cancer +RNAinsight Panel offered by [facility] and includes sequencing and rearrangement analysis for the following 91 genes: AIP, ALK, APC, ATM, AXIN2, BAP1, BARD1, BLM, BMPR1A, BRCA1, BRCA2, BRIP1, CDC73, CDH1, CDK4, CDKN1B, CDKN2A, CHEK2, CTNNA1, DICER1, FANCC, FH, FLCN, GALNT12, KIF1B, LZTR1, MAX, MEN1, MET, MLH1, MRE11A, MSH2, MSH3, MSH6, MUTYH, NBN, NF1, NF2, NTHL1, PALB2, PHOX2B, PMS2, POT1, PRKAR1A, PTCH1, PTEN, RAD50, RAD51C, RAD51D, RB1, RECQL, RET, SDHA, SDHAF2, SDHB, SDHC, SDHD, SMAD4, SMARCA4, SMARCB1, SMARCE1, STK11, SUFU, TMEM127, TP53, TSC1, TSC2, VHL and XRCC2 (sequencing and deletion/duplication); CASR, CFTR, CPA1, CTRC, EGFR, EGLN1, FAM175A, HOXB13, KIT, MITF, MLH3, PALLD, PDGFRA, POLD1, POLE, PRSS1, RINT1, RPS20, SPINK1 and TERT (sequencing only); EPCAM and GREM1 (deletion/duplication only). RNA data is routinely analyzed for use in variant interpretation for all genes.      INTERVAL HISTORY:  Addysen L Early was contacted for a follow up of metastatic pancreatic cancer. She was last seen by me on 06/29/21 while she was in the hospital.  She has recovered well from her recent hospital stay, pain is much improved, she is overall eating well.  She has returned to work today.  No fever, chills, or other new complaints.   All other systems were reviewed with the patient and are negative.  MEDICAL HISTORY:  Past Medical History:  Diagnosis Date    Asthma    Bipolar disorder (HCC)    Diverticulosis    Gestational hypertension    HA (headache)    History of borderline personality disorder    Hypersomnia, persistent 04/29/2013   IBS (irritable bowel syndrome)    Memory loss, short term    dr sumner 04-03-13    Migraine    Pancreatic cancer (HCC)    Reflux esophagitis    Seasonal allergies     SURGICAL HISTORY: Past Surgical History:  Procedure Laterality Date   ABDOMINAL HYSTERECTOMY  2000   BILIARY BRUSHING  01/13/2021   Procedure: BILIARY BRUSHING;  Surgeon: Gessner, Carl E, MD;  Location: [facility];  Service: Endoscopy;;   BILIARY DILATION  02/09/2021   Procedure: BILIARY DILATION;  Surgeon: Mansouraty, Gabriel Jr., MD;  Location: [facility];  Service: Gastroenterology;;   BILIARY STENT PLACEMENT N/A 01/13/2021   Procedure: BILIARY STENT PLACEMENT;  Surgeon: Gessner, Carl E, MD;  Location: [facility];  Service: Endoscopy;  Laterality: N/A;   BILIARY STENT PLACEMENT  02/09/2021   Procedure: BILIARY STENT PLACEMENT;  Surgeon: Mansouraty, Gabriel Jr., MD;  Location: [facility];  Service: Gastroenterology;;   BIOPSY  01/13/2021   Procedure: BIOPSY;  Surgeon: Gessner, Carl E, MD;  Location: [facility];  Service:   Endoscopy;;   BIOPSY  02/09/2021   Procedure: BIOPSY;  Surgeon: Mansouraty, Gabriel Jr., MD;  Location: [facility];  Service: Gastroenterology;;   COLONOSCOPY  01/2020   ERCP N/A 01/13/2021   Procedure: ENDOSCOPIC RETROGRADE CHOLANGIOPANCREATOGRAPHY (ERCP);  Surgeon: Gessner, Carl E, MD;  Location: [facility];  Service: Endoscopy;  Laterality: N/A;   ERCP N/A 02/09/2021   Procedure: ENDOSCOPIC RETROGRADE CHOLANGIOPANCREATOGRAPHY (ERCP);  Surgeon: Mansouraty, Gabriel Jr., MD;  Location: [facility];  Service: Gastroenterology;  Laterality: N/A;   ESOPHAGOGASTRODUODENOSCOPY (EGD) WITH PROPOFOL N/A 02/09/2021   Procedure: ESOPHAGOGASTRODUODENOSCOPY (EGD) WITH PROPOFOL;  Surgeon: Mansouraty, Gabriel Jr., MD;   Location: [facility];  Service: Gastroenterology;  Laterality: N/A;   EUS N/A 02/09/2021   Procedure: UPPER ENDOSCOPIC ULTRASOUND (EUS) LINEAR;  Surgeon: Mansouraty, Gabriel Jr., MD;  Location: [facility];  Service: Gastroenterology;  Laterality: N/A;   FINE NEEDLE ASPIRATION  02/09/2021   Procedure: FINE NEEDLE ASPIRATION (FNA) LINEAR;  Surgeon: Mansouraty, Gabriel Jr., MD;  Location: [facility];  Service: Gastroenterology;;   KNEE SURGERY Right 96,98,00,02,05,2014   NASAL SINUS SURGERY     NOSE SURGERY     1996,1998   PORTACATH PLACEMENT Right 02/17/2021   Procedure: INSERTION PORT-A-CATH;  Surgeon: Allen, Shelby L, MD;  Location: [facility];  Service: General;  Laterality: Right;  LMA ANESTHESIA   REPLACEMENT TOTAL KNEE Right    SPHINCTEROTOMY  01/13/2021   Procedure: SPHINCTEROTOMY;  Surgeon: Gessner, Carl E, MD;  Location: [facility];  Service: Endoscopy;;   STENT REMOVAL  02/09/2021   Procedure: STENT REMOVAL;  Surgeon: Mansouraty, Gabriel Jr., MD;  Location: [facility];  Service: Gastroenterology;;   WRIST SURGERY Right 92,94,2012    I have reviewed the social history and family history with the patient and they are unchanged from previous note.  ALLERGIES:  is allergic to aspirin, bee venom, penicillin g, penicillins, bactrim [sulfamethoxazole-trimethoprim], coconut oil, sulfa antibiotics, and sulfamethoxazole-trimethoprim.  MEDICATIONS:  Current Outpatient Medications  Medication Sig Dispense Refill   acetaminophen (TYLENOL) 325 MG tablet Take 325-650 mg by mouth every 6 (six) hours as needed for mild pain or headache.     albuterol (VENTOLIN HFA) 108 (90 Base) MCG/ACT inhaler Inhale 2 puffs into the lungs every 6 (six) hours as needed for wheezing or shortness of breath.     cyclobenzaprine (FLEXERIL) 5 MG tablet TAKE 1 TABLET(5 MG) BY MOUTH AT BEDTIME AS NEEDED FOR LOWER BACK PAIN OR SEVERE HEADACHE (Patient taking differently: Take 5 mg by mouth at bedtime as needed (for  lower back pain or severe headaches).) 30 tablet 0   dicyclomine (BENTYL) 10 MG capsule Take 1 capsule (10 mg total) by mouth 4 (four) times daily -  before meals and at bedtime. 30 capsule 1   diphenoxylate-atropine (LOMOTIL) 2.5-0.025 MG tablet Take 1-2 tabs by mouth 4 times a day as needed for loose stools. 30 tablet 0   EPINEPHrine 0.3 mg/0.3 mL IJ SOAJ injection Inject 0.3 mg into the muscle as needed for anaphylaxis. INJECT 0.3 MLS (0.3 MG TOTAL) INTO THE MUSCLE ONCE (Patient taking differently: Inject 0.3 mg into the muscle once as needed for anaphylaxis.) 1 each 0   fluticasone (FLONASE) 50 MCG/ACT nasal spray Place 2 sprays into both nostrils daily.     gabapentin (NEURONTIN) 300 MG capsule Take 300 mg by mouth 3 (three) times daily as needed (for neuropathic pain).     LATUDA 40 MG TABS tablet Take 1 tablet (40 mg total) by mouth daily with breakfast. 90 tablet 0     lidocaine-prilocaine (EMLA) cream Apply to affected area once (Patient taking differently: Apply 1 application topically as directed.) 30 g 3   magic mouthwash w/lidocaine SOLN Take 10 mLs by mouth 4 (four) times daily as needed for mouth pain. 10 mls swish and spit by mouth four times daily as needed 470 mL 2   montelukast (SINGULAIR) 10 MG tablet Take 10 mg by mouth daily.     morphine (MS CONTIN) 15 MG 12 hr tablet Take 1 tablet (15 mg total) by mouth every 12 (twelve) hours. 60 tablet 0   ondansetron (ZOFRAN) 8 MG tablet Take 1 tablet (8 mg total) by mouth every 8 (eight) hours as needed for nausea or vomiting. Starting on day 3 after chemotherapy 30 tablet 2   Oxycodone HCl 10 MG TABS Take 1 tablet (10 mg total) by mouth every 6 (six) hours as needed. 90 tablet 0   PRESCRIPTION MEDICATION Inject into the vein See admin instructions. FOLFIRINOX- Every 14 days for 8 treatments     prochlorperazine (COMPAZINE) 10 MG tablet Take 1 tablet (10 mg total) by mouth every 6 (six) hours as needed for vomiting or nausea. 60 tablet 2    promethazine (PHENERGAN) 25 MG tablet Take 25 mg by mouth every 6 (six) hours as needed for vomiting or nausea.     Pyridoxine HCl (VITAMIN B6 PO) Take by mouth.     No current facility-administered medications for this visit.    PHYSICAL EXAMINATION: ECOG PERFORMANCE STATUS: 1 - Symptomatic but completely ambulatory  There were no vitals filed for this visit. Wt Readings from Last 3 Encounters:  06/28/21 200 lb (90.7 kg)  06/22/21 200 lb 11.2 oz (91 kg)  06/08/21 204 lb (92.5 kg)     No vitals taken today, Exam not performed today  LABORATORY DATA:  I have reviewed the data as listed CBC Latest Ref Rng & Units 06/29/2021 06/28/2021 06/22/2021  WBC 4.0 - 10.5 K/uL 4.3 3.2(L) 4.2  Hemoglobin 12.0 - 15.0 g/dL 10.4(L) 11.4(L) 12.0  Hematocrit 36.0 - 46.0 % 31.8(L) 35.6(L) 36.9  Platelets 150 - 400 K/uL 125(L) 127(L) 174     CMP Latest Ref Rng & Units 06/30/2021 06/29/2021 06/28/2021  Glucose 70 - 99 mg/dL - 94 122(H)  BUN 6 - 20 mg/dL - 12 14  Creatinine 0.44 - 1.00 mg/dL - 0.97 1.15(H)  Sodium 135 - 145 mmol/L - 138 134(L)  Potassium 3.5 - 5.1 mmol/L - 3.7 3.7  Chloride 98 - 111 mmol/L - 107 103  CO2 22 - 32 mmol/L - 24 23  Calcium 8.9 - 10.3 mg/dL - 8.5(L) 8.5(L)  Total Protein 6.5 - 8.1 g/dL 6.1(L) 6.5 7.0  Total Bilirubin 0.3 - 1.2 mg/dL 0.9 1.2 1.4(H)  Alkaline Phos 38 - 126 U/L 100 120 149(H)  AST 15 - 41 U/L 104(H) 257(H) 505(H)  ALT 0 - 44 U/L 192(H) 319(H) 458(H)      RADIOGRAPHIC STUDIES: I have personally reviewed the radiological images as listed and agreed with the findings in the report. No results found.    No orders of the defined types were placed in this encounter.  All questions were answered. The patient knows to call the clinic with any problems, questions or concerns. No barriers to learning was detected. The total time spent in the appointment was 12 minutes.     Truitt Merle, MD 07/12/2021   I, Wilburn Mylar, am acting as scribe for Truitt Merle, MD.   I have reviewed the  above documentation for accuracy and completeness, and I agree with the above.     

## 2021-07-17 ENCOUNTER — Inpatient Hospital Stay: Payer: 59

## 2021-07-17 ENCOUNTER — Other Ambulatory Visit: Payer: Self-pay

## 2021-07-17 VITALS — BP 123/64 | HR 63 | Temp 98.7°F | Resp 18 | Wt 200.5 lb

## 2021-07-17 DIAGNOSIS — C787 Secondary malignant neoplasm of liver and intrahepatic bile duct: Secondary | ICD-10-CM | POA: Diagnosis present

## 2021-07-17 DIAGNOSIS — C250 Malignant neoplasm of head of pancreas: Secondary | ICD-10-CM | POA: Diagnosis present

## 2021-07-17 DIAGNOSIS — Z5111 Encounter for antineoplastic chemotherapy: Secondary | ICD-10-CM | POA: Diagnosis present

## 2021-07-17 DIAGNOSIS — Z79899 Other long term (current) drug therapy: Secondary | ICD-10-CM | POA: Diagnosis not present

## 2021-07-17 DIAGNOSIS — G620 Drug-induced polyneuropathy: Secondary | ICD-10-CM | POA: Diagnosis not present

## 2021-07-17 DIAGNOSIS — T451X5A Adverse effect of antineoplastic and immunosuppressive drugs, initial encounter: Secondary | ICD-10-CM | POA: Diagnosis not present

## 2021-07-17 DIAGNOSIS — Z803 Family history of malignant neoplasm of breast: Secondary | ICD-10-CM | POA: Diagnosis not present

## 2021-07-17 LAB — CMP (CANCER CENTER ONLY)
ALT: 26 U/L (ref 0–44)
AST: 23 U/L (ref 15–41)
Albumin: 3.8 g/dL (ref 3.5–5.0)
Alkaline Phosphatase: 64 U/L (ref 38–126)
Anion gap: 6 (ref 5–15)
BUN: 14 mg/dL (ref 6–20)
CO2: 26 mmol/L (ref 22–32)
Calcium: 9.4 mg/dL (ref 8.9–10.3)
Chloride: 108 mmol/L (ref 98–111)
Creatinine: 0.83 mg/dL (ref 0.44–1.00)
GFR, Estimated: >60 mL/min (ref >60)
Glucose, Bld: 102 mg/dL — ABNORMAL HIGH (ref 70–99)
Potassium: 4.2 mmol/L (ref 3.5–5.1)
Sodium: 140 mmol/L (ref 135–145)
Total Bilirubin: 0.7 mg/dL (ref 0.3–1.2)
Total Protein: 7.1 g/dL (ref 6.5–8.1)

## 2021-07-17 LAB — CBC WITH DIFFERENTIAL (CANCER CENTER ONLY)
Abs Immature Granulocytes: 0.02 10*3/uL (ref 0.00–0.07)
Basophils Absolute: 0.1 10*3/uL (ref 0.0–0.1)
Basophils Relative: 1 %
Eosinophils Absolute: 0.1 10*3/uL (ref 0.0–0.5)
Eosinophils Relative: 1 %
HCT: 34.9 % — ABNORMAL LOW (ref 36.0–46.0)
Hemoglobin: 10.9 g/dL — ABNORMAL LOW (ref 12.0–15.0)
Immature Granulocytes: 0 %
Lymphocytes Relative: 34 %
Lymphs Abs: 2.2 10*3/uL (ref 0.7–4.0)
MCH: 28.9 pg (ref 26.0–34.0)
MCHC: 31.2 g/dL (ref 30.0–36.0)
MCV: 92.6 fL (ref 80.0–100.0)
Monocytes Absolute: 0.7 10*3/uL (ref 0.1–1.0)
Monocytes Relative: 10 %
Neutro Abs: 3.5 10*3/uL (ref 1.7–7.7)
Neutrophils Relative %: 54 %
Platelet Count: 208 10*3/uL (ref 150–400)
RBC: 3.77 MIL/uL — ABNORMAL LOW (ref 3.87–5.11)
RDW: 14 % (ref 11.5–15.5)
WBC Count: 6.4 10*3/uL (ref 4.0–10.5)
nRBC: 0 % (ref 0.0–0.2)

## 2021-07-17 MED ORDER — SODIUM CHLORIDE 0.9 % IV SOLN
150.0000 mg | Freq: Once | INTRAVENOUS | Status: AC
  Administered 2021-07-17: 150 mg via INTRAVENOUS
  Filled 2021-07-17: qty 150

## 2021-07-17 MED ORDER — SODIUM CHLORIDE 0.9 % IV SOLN
130.0000 mg/m2 | Freq: Once | INTRAVENOUS | Status: AC
  Administered 2021-07-17: 280 mg via INTRAVENOUS
  Filled 2021-07-17: qty 14

## 2021-07-17 MED ORDER — ATROPINE SULFATE 1 MG/ML IV SOLN
0.5000 mg | Freq: Once | INTRAVENOUS | Status: AC | PRN
  Administered 2021-07-17: 0.5 mg via INTRAVENOUS
  Filled 2021-07-17: qty 1

## 2021-07-17 MED ORDER — OXALIPLATIN CHEMO INJECTION 100 MG/20ML
50.0000 mg/m2 | Freq: Once | INTRAVENOUS | Status: AC
  Administered 2021-07-17: 105 mg via INTRAVENOUS
  Filled 2021-07-17: qty 20

## 2021-07-17 MED ORDER — SODIUM CHLORIDE 0.9 % IV SOLN
400.0000 mg/m2 | Freq: Once | INTRAVENOUS | Status: AC
  Administered 2021-07-17: 856 mg via INTRAVENOUS
  Filled 2021-07-17: qty 42.8

## 2021-07-17 MED ORDER — SODIUM CHLORIDE 0.9 % IV SOLN
10.0000 mg | Freq: Once | INTRAVENOUS | Status: AC
  Administered 2021-07-17: 10 mg via INTRAVENOUS
  Filled 2021-07-17: qty 10

## 2021-07-17 MED ORDER — ATROPINE SULFATE 1 MG/ML IV SOLN
0.5000 mg | Freq: Once | INTRAVENOUS | Status: AC
  Administered 2021-07-17: 0.5 mg via INTRAVENOUS
  Filled 2021-07-17: qty 1

## 2021-07-17 MED ORDER — SODIUM CHLORIDE 0.9 % IV SOLN
2325.0000 mg/m2 | INTRAVENOUS | Status: DC
  Administered 2021-07-17: 5000 mg via INTRAVENOUS
  Filled 2021-07-17: qty 100

## 2021-07-17 MED ORDER — DEXTROSE 5 % IV SOLN: Freq: Once | INTRAVENOUS | Status: AC

## 2021-07-17 MED ORDER — PALONOSETRON HCL INJECTION 0.25 MG/5ML
0.2500 mg | Freq: Once | INTRAVENOUS | Status: AC
  Administered 2021-07-17: 0.25 mg via INTRAVENOUS
  Filled 2021-07-17: qty 5

## 2021-07-17 MED ORDER — SODIUM CHLORIDE 0.9 % IV SOLN: Freq: Once | INTRAVENOUS | Status: AC

## 2021-07-17 NOTE — Patient Instructions (Signed)
[facility]  Discharge Instructions: Thank you for choosing [facility] to provide your oncology and hematology care.   If you have a lab appointment with [facility], please go directly to the [facility] and check in at the registration area.   Wear comfortable clothing and clothing appropriate for easy access to any Portacath or PICC line.   We strive to give you quality time with your provider. You may need to reschedule your appointment if you arrive late (15 or more minutes).  Arriving late affects you and other patients whose appointments are after yours.  Also, if you miss three or more appointments without notifying the office, you may be dismissed from the clinic at the provider's discretion.      For prescription refill requests, have your pharmacy contact our office and allow 72 hours for refills to be completed.    Today you received the following chemotherapy and/or immunotherapy agents Oxaliplatin, leucovorin, Irinotecan, 5 FU.      To help prevent nausea and vomiting after your treatment, we encourage you to take your nausea medication as directed.  BELOW ARE SYMPTOMS THAT SHOULD BE REPORTED IMMEDIATELY: *FEVER GREATER THAN 100.4 F (38 C) OR HIGHER *CHILLS OR SWEATING *NAUSEA AND VOMITING THAT IS NOT CONTROLLED WITH YOUR NAUSEA MEDICATION *UNUSUAL SHORTNESS OF BREATH *UNUSUAL BRUISING OR BLEEDING *URINARY PROBLEMS (pain or burning when urinating, or frequent urination) *BOWEL PROBLEMS (unusual diarrhea, constipation, pain near the anus) TENDERNESS IN MOUTH AND THROAT WITH OR WITHOUT PRESENCE OF ULCERS (sore throat, sores in mouth, or a toothache) UNUSUAL RASH, SWELLING OR PAIN  UNUSUAL VAGINAL DISCHARGE OR ITCHING   Items with * indicate a potential emergency and should be followed up as soon as possible or go to the Emergency Department if any problems should occur.  Please show the CHEMOTHERAPY ALERT CARD or  IMMUNOTHERAPY ALERT CARD at check-in to the Emergency Department and triage nurse.  Should you have questions after your visit or need to cancel or reschedule your appointment, please contact [facility]  Dept: [phone]  and follow the prompts.  Office hours are 8:00 a.m. to 4:30 p.m. Monday - Friday. Please note that voicemails left after 4:00 p.m. may not be returned until the following business day.  We are closed weekends and major holidays. You have access to a nurse at all times for urgent questions. Please call the main number to the clinic Dept: [phone] and follow the prompts.   For any non-urgent questions, you may also contact your provider using MyChart. We now offer e-Visits for anyone 62 and older to request care online for non-urgent symptoms. For details visit [email or web address].   Also download the MyChart app! Go to the app store, search "MyChart", open the app, select [facility], and log in with your MyChart username and password.  Due to Covid, a mask is required upon entering the hospital/clinic. If you do not have a mask, one will be given to you upon arrival. For doctor visits, patients may have 1 support person aged 63 or older with them. For treatment visits, patients cannot have anyone with them due to current Covid guidelines and our immunocompromised population.

## 2021-07-19 ENCOUNTER — Inpatient Hospital Stay: Payer: 59

## 2021-07-19 VITALS — BP 109/48 | HR 83 | Temp 98.3°F | Resp 18

## 2021-07-19 DIAGNOSIS — Z5111 Encounter for antineoplastic chemotherapy: Secondary | ICD-10-CM | POA: Diagnosis not present

## 2021-07-19 DIAGNOSIS — Z95828 Presence of other vascular implants and grafts: Secondary | ICD-10-CM

## 2021-07-19 MED ORDER — SODIUM CHLORIDE 0.9% FLUSH
10.0000 mL | Freq: Once | INTRAVENOUS | Status: AC
  Administered 2021-07-19: 10 mL

## 2021-07-19 MED ORDER — HEPARIN SOD (PORK) LOCK FLUSH 100 UNIT/ML IV SOLN
500.0000 [IU] | Freq: Once | INTRAVENOUS | Status: AC
  Administered 2021-07-19: 500 [IU]

## 2021-07-24 ENCOUNTER — Telehealth: Payer: Self-pay

## 2021-07-24 NOTE — Telephone Encounter (Signed)
Sent message to scheduling to reschedule all the pt's appts for 07/27/2021 & 07/29/2021 to 07/31/2021 & 08/02/2021.  Sent Secure chat message to infusion charge nurse Delle Reining C.) and asst nurse manager Laurence Compton) to see if they can accommodate the pt's treatment for Monday 07/31/2021 ([facility]OLFIRINOX).  Awaiting Infusion's response to see if they can accommodate.

## 2021-07-27 ENCOUNTER — Ambulatory Visit: Payer: 59 | Admitting: Hematology

## 2021-07-27 ENCOUNTER — Ambulatory Visit: Payer: 59

## 2021-07-27 ENCOUNTER — Other Ambulatory Visit: Payer: 59

## 2021-07-31 ENCOUNTER — Other Ambulatory Visit: Payer: 59

## 2021-07-31 ENCOUNTER — Ambulatory Visit: Payer: 59 | Admitting: Hematology

## 2021-07-31 ENCOUNTER — Ambulatory Visit: Payer: 59

## 2021-08-01 NOTE — Progress Notes (Signed)
[place]   [phone] Fax:([phone]) [phone]   Clinic Follow up Note   Patient Care Team: [name], MD as PCP - General (Family Medicine) Jerline Pain, MD as PCP - Cardiology (Cardiology) End, Harrell Gave, MD as Consulting Physician (Cardiology) Alda Berthold, DO as Consulting Physician (Neurology) Heilingoetter, Tobe Sos, PA-C as Physician Assistant (Physician Assistant) Truitt Merle, MD as Consulting Physician (Oncology) Dwan Bolt, MD as Consulting Physician (General Surgery) 08/02/2021  CHIEF COMPLAINT: Follow-up metastatic pancreas cancer  SUMMARY OF ONCOLOGIC HISTORY: Oncology History Overview Note  Cancer Staging Pancreatic cancer [facility]) Staging form: Exocrine Pancreas, AJCC 8th Edition - Clinical stage from 02/09/2021: Stage IV (cT2, cN0, cM1) - Signed by Truitt Merle, MD on 03/16/2021 Total positive nodes: 0    Pancreatic cancer ([place])  01/11/2021 Imaging   CT Abdomen/Pelvis  IMPRESSION: Multiple low-attenuation liver lesions, which cannot be characterized on this unenhanced exam. Abdomen MRI without and with contrast is recommended for further characterization.   Diffuse biliary ductal dilatation, with soft tissue prominence in the region of the pancreatic head. Pancreatic mass cannot be excluded on this unenhanced exam. Recommend abdomen MRI and MRCP without and with contrast for further evaluation.   Colonic diverticulosis, without radiographic evidence of diverticulitis.     01/12/2021 Imaging   MRI ABDOMEN WITHOUT AND WITH CONTRAST (INCLUDING MRCP)  IMPRESSION: Biliary duct distension of both intra and extrahepatic biliary tree with abrupt caliber change but with no visible mass on imaging. Caliber change more abrupt than expected for stricture and the biliary tree was top-normal size at 6 mm in 2017. Endoscopic assessment is suggested to exclude an occult biliary lesion or atypical stricture.   Lobulated T2 bright  lesion in the medial segment of the LEFT hepatic lobe that favors a sclerosed hemangioma. There is some internal septation suggested and the filling pattern is slightly atypical. Consider 3 to six-month follow-up with extended delays following contrast to 7 minutes better assess filling characteristics, differential consideration would include a biliary cystadenoma with septal enhancement.   01/13/2021 Procedure   ERCP under the care of Dr. Carlean Purl  IMPRESSION: 1. Dilation of the common bile duct and main pancreatic duct. 2. Sphincterotomy and placement of a plastic biliary stent.    01/13/2021 Pathology Results   CASE: [id number]   A. COMMON BILE DUCT, STRICTURE, #1, BRUSHING:   FINAL MICROSCOPIC DIAGNOSIS:  - Suspicious for malignancy  - See comment    02/08/2021 Imaging   CT CHEST WITHOUT CONTRAST AND CT OF ABDOMEN AND PELVIS WITH CONTRAST  IMPRESSION: 1. Findings are highly suspicious for a hypovascular mass in the posterior aspect of the head of the pancreas, as detailed above. This appears associated with persistent narrowing of the distal common bile duct and abrupt termination of the pancreatic duct. Common bile duct stent appears appropriately located, but is associated with some persistent dilatation of the common bile duct. 2. 1.2 x 0.7 cm ground-glass attenuation nodule in the right upper lobe. Initial follow-up with CT at 6-12 months is recommended to confirm persistence. If persistent, repeat CT is recommended every 2 years until 5 years of stability has been established. This recommendation follows the consensus statement: Guidelines for Management of Incidental Pulmonary Nodules Detected on CT Images: From the Fleischner Society 2017; Radiology 2017; 284:228-243. 3. Multiple liver lesions, smallest of which likely represent tiny simple cysts. The larger lesion in the left lobe of the liver has imaging characteristics once again most suggestive of a  cavernous hemangioma.  4. Aortic atherosclerosis, in addition to left anterior descending coronary artery disease. Please note that although the presence of coronary artery calcium documents the presence of coronary artery disease, the severity of this disease and any potential stenosis cannot be assessed on this non-gated CT examination. Assessment for potential risk factor modification, dietary therapy or pharmacologic therapy may be warranted, if clinically indicated. 5. Additional incidental findings, as above.    02/09/2021 Procedure   ERCP under the care of Dr. Rush Landmark   IMPRESSION: Limited images during ERCP demonstrates removal plastic biliary stent and placement of a new metallic biliary stent. Please refer to the dictated operative report for full details of intraoperative findings and procedure.   02/09/2021 Pathology Results   CASE: [id number]  FINAL MICROSCOPIC DIAGNOSIS:  A. PANCREAS, HEAD OF LESION, FINE NEEDLE ASPIRATION:  - Malignant cells consistent with adenocarcinoma    02/09/2021 Procedure   EGD Impression: - No gross lesions in esophagus. Z-line regular, 40 cm from the incisors. - Severe gastritis noted. No other gross lesions in the stomach. Biopsied. - Plastic biliary stent in the duodenum. Removed. Revealed a patent biliary sphincterotomy was found. - No gross lesions in the duodenal bulb, in the first portion of the duodenum and in the second portion of the duodenum. EUS Impression: - Hyperechoic material consistent with sludge was visualized endosonographically in the gallbladder. - There was a suggestion of a stricture in the lower third of the main bile duct leading to dilation in the middle third of the main bile duct and in the upper third of the main bile duct with evidence of hyperechoic material consistent with sludge within the common bile duct. - A mass-like region was identified in the pancreatic head where the CBD narrowed and where the  upstream PD dilation was noted. This is not the most confluent of masses however. Cytology results are pending. However, the endosonographic appearance is suggestive of potential pancreatic adenocarcinoma. This was staged T2 N0 Mx by endosonographic criteria. The staging applies if malignancy is confirmed. Fine needle biopsy performed. - No malignant-appearing lymph nodes were visualized in the celiac region (level 20), peripancreatic region and porta hepatis region.   02/09/2021 Cancer Staging   Staging form: Exocrine Pancreas, AJCC 8th Edition - Clinical stage from 02/09/2021: Stage IV (cT2, cN0, cM1) - Signed by Truitt Merle, MD on 03/16/2021 Total positive nodes: 0    02/13/2021 Initial Diagnosis   Pancreatic cancer ([place])   02/28/2021 Imaging   PET  Signs of pancreatic head mass with suspected hepatic metastatic lesion in the RIGHT hepatic lobe as described.   Equivocal uptake in adjacent lymph nodes, potentially reactive in the setting of biliary stent. Note the biliary stent placement is in the mid to distal common bile duct but appears similar to recent ERCP images.   Stable appearance of ground-glass in the RIGHT upper lobe. This does not display increased FDG uptake and would be atypical for metastatic lesion based on appearance and location. Suggest six-month follow-up for further evaluation.   RIGHT thyroid uptake is slightly asymmetric potentially associated with small nodule. Recommend thyroid ultrasound with biopsy of focal abnormality is seen.(Ref: J Am Coll Radiol. 2015 Feb;12(2): 143-50).   03/02/2021 -  Chemotherapy   Patient is on Treatment Plan : PANCREAS Modified FOLFIRINOX q14d x 4 cycles     04/25/2021 Imaging   CT AP w contrast IMPRESSION: 1. No evidence of pancreatic carcinoma progression by CT imaging. 2. Hypermetabolic lesions on comparison PET scan within the head of  the pancreas and liver were essentially occult by CT imaging. Consider follow-up FDG PET  scan at some future point. 3. Multiple small benign-appearing hypodense lesions in the liver and benign hepatic hemangioma unchanged from prior. 4. No evidence of lymphadenopathy   07/01/2021 Genetic Testing   Negative hereditary cancer genetic testing: no pathogenic variants detected in [facility] CustomNext-Cancer +RNAinsight Panel.  The report date is July 01, 2021.   The CustomNext-Cancer +RNAinsight Panel offered by [facility] and includes sequencing and rearrangement analysis for the following 91 genes: AIP, ALK, APC, ATM, AXIN2, BAP1, BARD1, BLM, BMPR1A, BRCA1, BRCA2, BRIP1, CDC73, CDH1, CDK4, CDKN1B, CDKN2A, CHEK2, CTNNA1, DICER1, FANCC, FH, FLCN, GALNT12, KIF1B, LZTR1, MAX, MEN1, MET, MLH1, MRE11A, MSH2, MSH3, MSH6, MUTYH, NBN, NF1, NF2, NTHL1, PALB2, PHOX2B, PMS2, POT1, PRKAR1A, PTCH1, PTEN, RAD50, RAD51C, RAD51D, RB1, RECQL, RET, SDHA, SDHAF2, SDHB, SDHC, SDHD, SMAD4, SMARCA4, SMARCB1, SMARCE1, STK11, SUFU, TMEM127, TP53, TSC1, TSC2, VHL and XRCC2 (sequencing and deletion/duplication); CASR, CFTR, CPA1, CTRC, EGFR, EGLN1, FAM175A, HOXB13, KIT, MITF, MLH3, PALLD, PDGFRA, POLD1, POLE, PRSS1, RINT1, RPS20, SPINK1 and TERT (sequencing only); EPCAM and GREM1 (deletion/duplication only). RNA data is routinely analyzed for use in variant interpretation for all genes.     CURRENT THERAPY: First line FOLFIRINOX q2 weeks, starting 03/02/21, OXali held with C3 due to neuropathy. Dose reduced to 70 mg/m2 added back with C4, further reduced to 78m/m2 from cycle 7 due to tolerance issue  INTERVAL HISTORY: Ms. TShimmelreturns for follow-up and treatment as scheduled.  Last seen 07/12/2021 and chemo resumed 07/17/2021.  She tolerated this cycle better than previous, and feels well today.  Less nausea/vomiting than last time.  She has diarrhea 3-4 times per day for 3-4 days after treatment, managed with Imodium and Lomotil.  Eating and drinking well and better in the last week, takes 1 Ensure per day.  She  occasionally gets a mouth sore for 1-2 days but does not limit p.o. intake.   Epigastric/back pain is well controlled on MS Contin twice daily and oxycodone 1-2 times daily.  She has an occasional "contraction" that lasts up to 20 seconds but not major.  Cold sensitivity lasts 5-6 days, she has residual neuropathy in her feet but better in her hands.  She is functioning well, not dropping things.  Managed on gabapentin 300 mg 3 times daily. Denies rash, fever, chills, cough, chest pain, dyspnea, or other new complaints.   MEDICAL HISTORY:  Past Medical History:  Diagnosis Date   Asthma    Bipolar disorder ([place]    Diverticulosis    Gestational hypertension    HA (headache)    History of borderline personality disorder    Hypersomnia, persistent 04/29/2013   IBS (irritable bowel syndrome)    Memory loss, short term    dr sJanann Colonel8-8-14    Migraine    Pancreatic cancer ([place]    Reflux esophagitis    Seasonal allergies     SURGICAL HISTORY: Past Surgical History:  Procedure Laterality Date   ABDOMINAL HYSTERECTOMY  2000   BILIARY BRUSHING  01/13/2021   Procedure: BILIARY BRUSHING;  Surgeon: GGatha Mayer MD;  Location: [facility];  Service: Endoscopy;;   BILIARY DILATION  02/09/2021   Procedure: BILIARY DILATION;  Surgeon: MIrving Copas, MD;  [place]  Service: Gastroenterology;;   BILIARY STENT PLACEMENT N/A 01/13/2021   Procedure: BILIARY STENT PLACEMENT;  Surgeon: GGatha Mayer MD;  [facility]: [facility];  Service: Endoscopy;  Laterality: N/A;   BILIARY STENT  PLACEMENT  02/09/2021   Procedure: BILIARY STENT PLACEMENT;  Surgeon: Irving Copas., MD;  Location: [place];  Service: Gastroenterology;;   BIOPSY  01/13/2021   Procedure: BIOPSY;  Surgeon: Gatha Mayer, MD;  [facility];  Service: Endoscopy;;   BIOPSY  02/09/2021   Procedure: BIOPSY;  Surgeon: Irving Copas., MD;  Location: [place];  Service:  Gastroenterology;;   COLONOSCOPY  01/2020   ERCP N/A 01/13/2021   Procedure: ENDOSCOPIC RETROGRADE CHOLANGIOPANCREATOGRAPHY (ERCP);  Surgeon: Gatha Mayer, MD;  [facility];  Service: Endoscopy;  Laterality: N/A;   ERCP N/A 02/09/2021   Procedure: ENDOSCOPIC RETROGRADE CHOLANGIOPANCREATOGRAPHY (ERCP);  Surgeon: Irving Copas., MD;  Location: [place];  Service: Gastroenterology;  Laterality: N/A;   ESOPHAGOGASTRODUODENOSCOPY (EGD) WITH PROPOFOL N/A 02/09/2021   Procedure: ESOPHAGOGASTRODUODENOSCOPY (EGD) WITH PROPOFOL;  Surgeon: Rush Landmark Telford Nab., MD;  Location: [place];  Service: Gastroenterology;  Laterality: N/A;   EUS N/A 02/09/2021   Procedure: UPPER ENDOSCOPIC ULTRASOUND (EUS) LINEAR;  Surgeon: Irving Copas., MD;  Location: [place];  Service: Gastroenterology;  Laterality: N/A;   FINE NEEDLE ASPIRATION  02/09/2021   Procedure: FINE NEEDLE ASPIRATION (FNA) LINEAR;  Surgeon: Irving Copas., MD;  Location: [place];  Service: Gastroenterology;;   KNEE SURGERY Right 96,98,00,02,05,2014   NASAL SINUS SURGERY     NOSE SURGERY     [phone]   PORTACATH PLACEMENT Right 02/17/2021   Procedure: INSERTION PORT-A-CATH;  Surgeon: Dwan Bolt, MD;  [facility];  Service: General;  Laterality: Right;  LMA ANESTHESIA   REPLACEMENT TOTAL KNEE Right    SPHINCTEROTOMY  01/13/2021   Procedure: SPHINCTEROTOMY;  Surgeon: Gatha Mayer, MD;  [facility];  Service: Endoscopy;;   STENT REMOVAL  02/09/2021   Procedure: Lavell Islam REMOVAL;  Surgeon: Irving Copas., MD;  Location: [place];  Service: Gastroenterology;;   WRIST SURGERY Right [phone]    I have reviewed the social history and family history with the patient and they are unchanged from previous note.  ALLERGIES:  is allergic to aspirin, bee venom, penicillin g, penicillins, bactrim [sulfamethoxazole-trimethoprim], coconut oil, sulfa antibiotics, and  sulfamethoxazole-trimethoprim.  MEDICATIONS:  Current Outpatient Medications  Medication Sig Dispense Refill   acetaminophen (TYLENOL) 325 MG tablet Take 325-650 mg by mouth every 6 (six) hours as needed for mild pain or headache.     albuterol (VENTOLIN HFA) 108 (90 Base) MCG/ACT inhaler Inhale 2 puffs into the lungs every 6 (six) hours as needed for wheezing or shortness of breath.     cyclobenzaprine (FLEXERIL) 5 MG tablet TAKE 1 TABLET(5 MG) BY MOUTH AT BEDTIME AS NEEDED FOR LOWER BACK PAIN OR SEVERE HEADACHE (Patient taking differently: Take 5 mg by mouth at bedtime as needed (for lower back pain or severe headaches).) 30 tablet 0   dicyclomine (BENTYL) 10 MG capsule Take 1 capsule (10 mg total) by mouth 4 (four) times daily -  before meals and at bedtime. 30 capsule 1   diphenoxylate-atropine (LOMOTIL) 2.5-0.025 MG tablet Take 1-2 tabs by mouth 4 times a day as needed for loose stools. 30 tablet 2   EPINEPHrine 0.3 mg/0.3 mL IJ SOAJ injection Inject 0.3 mg into the muscle as needed for anaphylaxis. INJECT 0.3 MLS (0.3 MG TOTAL) INTO THE MUSCLE ONCE (Patient taking differently: Inject 0.3 mg into the muscle once as needed for anaphylaxis.) 1 each 0   fluticasone (FLONASE) 50 MCG/ACT nasal spray Place 2 sprays into both nostrils daily.  gabapentin (NEURONTIN) 300 MG capsule Take 1 capsule (300 mg total) by mouth 3 (three) times daily. 90 capsule 2   LATUDA 40 MG TABS tablet Take 1 tablet (40 mg total) by mouth daily with breakfast. 90 tablet 0   lidocaine-prilocaine (EMLA) cream Apply to affected area once (Patient taking differently: Apply 1 application topically as directed.) 30 g 3   magic mouthwash w/lidocaine SOLN Take 10 mLs by mouth 4 (four) times daily as needed for mouth pain. 10 mls swish and spit by mouth four times daily as needed 470 mL 2   montelukast (SINGULAIR) 10 MG tablet Take 10 mg by mouth daily.     morphine (MS CONTIN) 15 MG 12 hr tablet Take 1 tablet (15 mg total) by  mouth every 12 (twelve) hours. 60 tablet 0   ondansetron (ZOFRAN) 8 MG tablet Take 1 tablet (8 mg total) by mouth every 8 (eight) hours as needed for nausea or vomiting. Starting on day 3 after chemotherapy 30 tablet 2   Oxycodone HCl 10 MG TABS Take 1 tablet (10 mg total) by mouth every 6 (six) hours as needed. 90 tablet 0   PRESCRIPTION MEDICATION Inject into the vein See admin instructions. FOLFIRINOX- Every 14 days for 8 treatments     prochlorperazine (COMPAZINE) 10 MG tablet Take 1 tablet (10 mg total) by mouth every 6 (six) hours as needed for vomiting or nausea. 60 tablet 2   promethazine (PHENERGAN) 25 MG tablet Take 25 mg by mouth every 6 (six) hours as needed for vomiting or nausea.     Pyridoxine HCl (VITAMIN B6 PO) Take by mouth.     No current facility-administered medications for this visit.    PHYSICAL EXAMINATION: ECOG PERFORMANCE STATUS: 1 - Symptomatic but completely ambulatory  Vitals:   08/02/21 0936  BP: 134/76  Pulse: 63  Resp: 18  Temp: 98.4 F (36.9 C)  SpO2: 99%   Filed Weights   08/02/21 0936  Weight: 200 lb 6 oz (90.9 kg)    GENERAL:alert, no distress and comfortable SKIN: skin color, texture, turgor are normal, no rashes or significant lesions EYES: normal, Conjunctiva are pink and non-injected, sclera clear OROPHARYNX:no exudate, no erythema and lips, buccal mucosa, and tongue normal  NECK: supple, thyroid normal size, non-tender, without nodularity LYMPH:  no palpable lymphadenopathy in the cervical, axillary or inguinal LUNGS: clear to auscultation and percussion with normal breathing effort HEART: regular rate & rhythm and no murmurs and no lower extremity edema ABDOMEN:abdomen soft, non-tender and normal bowel sounds Musculoskeletal:no cyanosis of digits and no clubbing  NEURO: alert & oriented x 3 with fluent speech, no focal motor/sensory deficits  LABORATORY DATA:  I have reviewed the data as listed CBC Latest Ref Rng & Units 08/02/2021  07/17/2021 06/29/2021  WBC 4.0 - 10.5 K/uL 5.4 6.4 4.3  Hemoglobin 12.0 - 15.0 g/dL 11.2(L) 10.9(L) 10.4(L)  Hematocrit 36.0 - 46.0 % 34.6(L) 34.9(L) 31.8(L)  Platelets 150 - 400 K/uL 208 208 125(L)     CMP Latest Ref Rng & Units 07/17/2021 06/30/2021 06/29/2021  Glucose 70 - 99 mg/dL 102(H) - 94  BUN 6 - 20 mg/dL 14 - 12  Creatinine 0.44 - 1.00 mg/dL 0.83 - 0.97  Sodium 135 - 145 mmol/L 140 - 138  Potassium 3.5 - 5.1 mmol/L 4.2 - 3.7  Chloride 98 - 111 mmol/L 108 - 107  CO2 22 - 32 mmol/L 26 - 24  Calcium 8.9 - 10.3 mg/dL 9.4 - 8.5(L)  Total Protein  6.5 - 8.1 g/dL 7.1 6.1(L) 6.5  Total Bilirubin 0.3 - 1.2 mg/dL 0.7 0.9 1.2  Alkaline Phos 38 - 126 U/L 64 100 120  AST 15 - 41 U/L 23 104(H) 257(H)  ALT 0 - 44 U/L 26 192(H) 319(H)      RADIOGRAPHIC STUDIES: I have personally reviewed the radiological images as listed and agreed with the findings in the report. No results found.   ASSESSMENT & PLAN:  Robin [name] is a 55 y.o. female with    1. Adenocarcinoma of the pancreas, cT2N0M1 with properble oligo liver met  -Presented with fatigue, abdominal pain, and jaundice 12/2020 s/p biliary stent x2  -CT 02/08/2021 showed 2.5 cm hypervascular area in pancreatic head adjacent to the common bile duct in direct contact with anterior surface of inferior vena cava, 1.2 cm groundglass right lung nodule, and multiple liver lesions -Baseline CA 19.9 elevated at 72 -EUS showed a masslike region in the pancreatic head where the CBD narrowed with upstream PD dilatation noted.  Endosonographic appearance was suggestive of potential pancreatic adenocarcinoma T2 N0 MX.  Final path showed malignant cells consistent with adenocarcinoma - PET scan showed a hypermetabolic liver lesion in the right lobe, adjacent to right kidney, suspicious for oligo liver metastasis. No other definitive distant mets. This was not visible on ultrasound, Liver biopsy is not feasible   -She began C1 FOLFIRINOX 03/02/21. Her  insurance denied prophylactic G-CSF. -s/p cycle 2 she began to develop early neuropathy, Oxali held with cycle 3.  -s/p cycle 3 she developed acute abd pain and nausea, with elevated lipase, concerning for acute pancreatitis. She was hospitalized for hydration and pain management.  -She recovered well, neuropathy resolved.  Able to resume chemo with cycle 4 FOLFIRINOX and dose reduced oxaliplatin on 04/13/2021 -restaging CT 04/25/21 showed the hypermetabolic lesions on comparison PET within the head of pancreas and liver were essentially called by CT, stable benign-appearing liver lesions, no progression of the pancreatic mass or adenopathy. -CA 19-9 improving on chemo.  -she was hospitalized in 06/2021 for COVID-19, she recovered well. Due to worsening back pain she underwent CT CAP 06/23/21 which was stable with no new metastatic disease -She resumed chemo 07/17/2021, tolerated well  2. 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 -decreased sensation in hands, more so on right -She started B complex and oxalic was held with cycle 3 -Neuropathy resolved, added Oxali back 70 mg/m2 with cycle 4 -On chemo she has neuropathy in her feet, improved in hands on gabapentin 300 mg 3 times daily -Stable since resuming chemo   3. Epigastric abdominal and back pain -Secondary to #1  -she had increased cramping abdominal pain, likely secondary to irinotecan. Will dose reduce to 130 mg/m2 and add bentyl. -Pain adequately managed lately on MS Contin twice daily and oxycodone 10 mg which she takes 1-2 times per day   4. Family history for cancer  -Maternal great-grandmother, grandmother, and 2 sisters with breast cancer -The patient has 1 biological daughter -declined referral for genetic testing at this time   5. Social -The patient lives with her wife and has 1 adopted son and a step son -The patient works as the Surveyor, quantity for [employer], still able to work part-time  periodically -Patient to let us know if she needs paperwork completed for any leave of absence for work   Disposition: Robin [name] appears stable. She resumed modified FOLFIRINOX on 07/17/21, tolerated well with mild nausea, diarrhea, and stable neuropathy. I refilled gabapentin 300  mg TID and lomotil. Side effects are adequately managed with supportive care at home.  She is able to recover, function well, and work part-time.  There is no clinical evidence of disease progression.  Labs reviewed, adequate to proceed with FOLFIRINOX 08/03/2021 at droppage as planned, same dose.  No G-CSF. We will follow up on the pending CA 19-9 from today. Normalized recently c/w treatment response.   She prefers to skip the week of Christmas, she will return for follow-up and next cycle in 3 weeks.  All questions were answered. The patient knows to call the clinic with any problems, questions or concerns. No barriers to learning were detected.     Alla Feeling, NP 08/02/21

## 2021-08-02 ENCOUNTER — Inpatient Hospital Stay: Payer: 59 | Attending: Physician Assistant | Admitting: Nurse Practitioner

## 2021-08-02 ENCOUNTER — Other Ambulatory Visit: Payer: Self-pay

## 2021-08-02 ENCOUNTER — Other Ambulatory Visit: Payer: 59

## 2021-08-02 ENCOUNTER — Encounter: Payer: Self-pay | Admitting: Nurse Practitioner

## 2021-08-02 ENCOUNTER — Inpatient Hospital Stay: Payer: 59

## 2021-08-02 DIAGNOSIS — G893 Neoplasm related pain (acute) (chronic): Secondary | ICD-10-CM | POA: Diagnosis not present

## 2021-08-02 DIAGNOSIS — R197 Diarrhea, unspecified: Secondary | ICD-10-CM | POA: Diagnosis not present

## 2021-08-02 DIAGNOSIS — C787 Secondary malignant neoplasm of liver and intrahepatic bile duct: Secondary | ICD-10-CM | POA: Diagnosis present

## 2021-08-02 DIAGNOSIS — T451X5D Adverse effect of antineoplastic and immunosuppressive drugs, subsequent encounter: Secondary | ICD-10-CM | POA: Diagnosis not present

## 2021-08-02 DIAGNOSIS — G620 Drug-induced polyneuropathy: Secondary | ICD-10-CM | POA: Diagnosis not present

## 2021-08-02 DIAGNOSIS — Z5111 Encounter for antineoplastic chemotherapy: Secondary | ICD-10-CM | POA: Insufficient documentation

## 2021-08-02 DIAGNOSIS — C250 Malignant neoplasm of head of pancreas: Secondary | ICD-10-CM | POA: Diagnosis present

## 2021-08-02 DIAGNOSIS — Z79899 Other long term (current) drug therapy: Secondary | ICD-10-CM | POA: Insufficient documentation

## 2021-08-02 DIAGNOSIS — Z95828 Presence of other vascular implants and grafts: Secondary | ICD-10-CM

## 2021-08-02 LAB — CBC WITH DIFFERENTIAL (CANCER CENTER ONLY)
Abs Immature Granulocytes: 0.02 10*3/uL (ref 0.00–0.07)
Basophils Absolute: 0 10*3/uL (ref 0.0–0.1)
Basophils Relative: 0 %
Eosinophils Absolute: 0.1 10*3/uL (ref 0.0–0.5)
Eosinophils Relative: 2 %
HCT: 34.6 % — ABNORMAL LOW (ref 36.0–46.0)
Hemoglobin: 11.2 g/dL — ABNORMAL LOW (ref 12.0–15.0)
Immature Granulocytes: 0 %
Lymphocytes Relative: 37 %
Lymphs Abs: 2 10*3/uL (ref 0.7–4.0)
MCH: 29.5 pg (ref 26.0–34.0)
MCHC: 32.4 g/dL (ref 30.0–36.0)
MCV: 91.1 fL (ref 80.0–100.0)
Monocytes Absolute: 0.6 10*3/uL (ref 0.1–1.0)
Monocytes Relative: 11 %
Neutro Abs: 2.7 10*3/uL (ref 1.7–7.7)
Neutrophils Relative %: 50 %
Platelet Count: 208 10*3/uL (ref 150–400)
RBC: 3.8 MIL/uL — ABNORMAL LOW (ref 3.87–5.11)
RDW: 13.8 % (ref 11.5–15.5)
WBC Count: 5.4 10*3/uL (ref 4.0–10.5)
nRBC: 0 % (ref 0.0–0.2)

## 2021-08-02 LAB — CMP (CANCER CENTER ONLY)
ALT: 27 U/L (ref 0–44)
AST: 24 U/L (ref 15–41)
Albumin: 3.6 g/dL (ref 3.5–5.0)
Alkaline Phosphatase: 74 U/L (ref 38–126)
Anion gap: 10 (ref 5–15)
BUN: 13 mg/dL (ref 6–20)
CO2: 23 mmol/L (ref 22–32)
Calcium: 9.3 mg/dL (ref 8.9–10.3)
Chloride: 109 mmol/L (ref 98–111)
Creatinine: 1.01 mg/dL — ABNORMAL HIGH (ref 0.44–1.00)
GFR, Estimated: >60 mL/min (ref >60)
Glucose, Bld: 92 mg/dL (ref 70–99)
Potassium: 4.4 mmol/L (ref 3.5–5.1)
Sodium: 142 mmol/L (ref 135–145)
Total Bilirubin: 0.5 mg/dL (ref 0.3–1.2)
Total Protein: 7.1 g/dL (ref 6.5–8.1)

## 2021-08-02 MED ORDER — DIPHENOXYLATE-ATROPINE 2.5-0.025 MG PO TABS: ORAL_TABLET | ORAL | 2 refills | Status: DC

## 2021-08-02 MED ORDER — HEPARIN SOD (PORK) LOCK FLUSH 100 UNIT/ML IV SOLN
500.0000 [IU] | Freq: Once | INTRAVENOUS | Status: AC
  Administered 2021-08-02: 500 [IU]

## 2021-08-02 MED ORDER — GABAPENTIN 300 MG PO CAPS: 300.0000 mg | ORAL_CAPSULE | Freq: Three times a day (TID) | ORAL | 2 refills | Status: DC

## 2021-08-02 MED ORDER — SODIUM CHLORIDE 0.9% FLUSH
10.0000 mL | Freq: Once | INTRAVENOUS | Status: AC
  Administered 2021-08-02: 10 mL

## 2021-08-03 ENCOUNTER — Inpatient Hospital Stay: Payer: 59

## 2021-08-03 VITALS — BP 125/65 | HR 84 | Temp 98.5°F | Resp 18

## 2021-08-03 DIAGNOSIS — C250 Malignant neoplasm of head of pancreas: Secondary | ICD-10-CM

## 2021-08-03 DIAGNOSIS — Z5111 Encounter for antineoplastic chemotherapy: Secondary | ICD-10-CM | POA: Diagnosis not present

## 2021-08-03 LAB — CANCER ANTIGEN 19-9: CA 19-9: 31 U/mL (ref 0–35)

## 2021-08-03 MED ORDER — PALONOSETRON HCL INJECTION 0.25 MG/5ML
0.2500 mg | Freq: Once | INTRAVENOUS | Status: AC
  Administered 2021-08-03: 0.25 mg via INTRAVENOUS
  Filled 2021-08-03: qty 5

## 2021-08-03 MED ORDER — OXALIPLATIN CHEMO INJECTION 100 MG/20ML
100.0000 mg | Freq: Once | INTRAVENOUS | Status: AC
  Administered 2021-08-03: 100 mg via INTRAVENOUS
  Filled 2021-08-03: qty 20

## 2021-08-03 MED ORDER — SODIUM CHLORIDE 0.9 % IV SOLN
400.0000 mg/m2 | Freq: Once | INTRAVENOUS | Status: AC
  Administered 2021-08-03: 856 mg via INTRAVENOUS
  Filled 2021-08-03: qty 42.8

## 2021-08-03 MED ORDER — SODIUM CHLORIDE 0.9 % IV SOLN
150.0000 mg | Freq: Once | INTRAVENOUS | Status: AC
  Administered 2021-08-03: 150 mg via INTRAVENOUS
  Filled 2021-08-03: qty 5

## 2021-08-03 MED ORDER — SODIUM CHLORIDE 0.9 % IV SOLN
10.0000 mg | Freq: Once | INTRAVENOUS | Status: AC
  Administered 2021-08-03: 10 mg via INTRAVENOUS
  Filled 2021-08-03: qty 1

## 2021-08-03 MED ORDER — SODIUM CHLORIDE 0.9 % IV SOLN
130.0000 mg/m2 | Freq: Once | INTRAVENOUS | Status: AC
  Administered 2021-08-03: 280 mg via INTRAVENOUS
  Filled 2021-08-03: qty 10

## 2021-08-03 MED ORDER — DEXTROSE 5 % IV SOLN: Freq: Once | INTRAVENOUS | Status: AC

## 2021-08-03 MED ORDER — SODIUM CHLORIDE 0.9 % IV SOLN
5000.0000 mg | INTRAVENOUS | Status: DC
  Administered 2021-08-03: 5000 mg via INTRAVENOUS
  Filled 2021-08-03: qty 100

## 2021-08-03 MED ORDER — ATROPINE SULFATE 1 MG/ML IV SOLN
0.5000 mg | Freq: Once | INTRAVENOUS | Status: AC | PRN
  Administered 2021-08-03: 0.5 mg via INTRAVENOUS
  Filled 2021-08-03: qty 1

## 2021-08-03 NOTE — Progress Notes (Signed)
Patient presents for treatment. RN assessment completed along with the following:  Labs/vitals reviewed - Yes, and within treatment parameters.   Weight within 10% of previous measurement - Yes Oncology Treatment Attestation completed for current therapy- Yes, on date 02/14/21 Informed consent completed and reflects current therapy/intent - Yes, on date 03/02/21             Provider progress note reviewed - Patient not seen by provider today. Most recent note dated 08/02/21 reviewed. Treatment/Antibody/Supportive plan reviewed - Yes, and there are no adjustments needed for today's treatment. S&H and other orders reviewed - Yes, and there are no additional orders identified. Previous treatment date reviewed - Yes, and the appropriate amount of time has elapsed between treatments. Clinic Hand Off Received from - none  Patient to proceed with treatment.

## 2021-08-03 NOTE — Patient Instructions (Signed)
[facility] The chemotherapy medication bag should finish at 46 hours, 96 hours, or 7 days. For example, if your pump is scheduled for 46 hours and it was put on at 4:00 p.m., it should finish at 2:00 p.m. the day it is scheduled to come off regardless of your appointment time.     Estimated time to finish at 11:30 Saturday, December 10,2022.   If the display on your pump reads "Low Volume" and it is beeping, take the batteries out of the pump and come to the cancer center for it to be taken off.   If the pump alarms go off prior to the pump reading "Low Volume" then call [phone] and someone can assist you.  If the plunger comes out and the chemotherapy medication is leaking out, please use your home chemo spill kit to clean up the spill. Do NOT use paper towels or other household products.  If you have problems or questions regarding your pump, please call either [phone] (24 hours a day) or the cancer center Monday-Friday 8:00 a.m.- 4:30 p.m. at the clinic number and we will assist you. If you are unable to get assistance, then go to the nearest Emergency Department and ask the staff to contact the IV team for assistance.  Discharge Instructions: Thank you for choosing [facility] to provide your oncology and hematology care.   If you have a lab appointment with the [facility], please go directly to the [facility] and check in at the registration area.   Wear comfortable clothing and clothing appropriate for easy access to any Portacath or PICC line.   We strive to give you quality time with your provider. You may need to reschedule your appointment if you arrive late (15 or more minutes).  Arriving late affects you and other patients whose appointments are after yours.  Also, if you miss three or more appointments without notifying the office, you may be dismissed from the clinic at the provider's discretion.      For prescription  refill requests, have your pharmacy contact our office and allow 72 hours for refills to be completed.    Today you received the following chemotherapy and/or immunotherapy agents Oxaliplatin, leucovorin, irinotecan, fluorouracil      To help prevent nausea and vomiting after your treatment, we encourage you to take your nausea medication as directed.  BELOW ARE SYMPTOMS THAT SHOULD BE REPORTED IMMEDIATELY: *FEVER GREATER THAN 100.4 F (38 C) OR HIGHER *CHILLS OR SWEATING *NAUSEA AND VOMITING THAT IS NOT CONTROLLED WITH YOUR NAUSEA MEDICATION *UNUSUAL SHORTNESS OF BREATH *UNUSUAL BRUISING OR BLEEDING *URINARY PROBLEMS (pain or burning when urinating, or frequent urination) *BOWEL PROBLEMS (unusual diarrhea, constipation, pain near the anus) TENDERNESS IN MOUTH AND THROAT WITH OR WITHOUT PRESENCE OF ULCERS (sore throat, sores in mouth, or a toothache) UNUSUAL RASH, SWELLING OR PAIN  UNUSUAL VAGINAL DISCHARGE OR ITCHING   Items with * indicate a potential emergency and should be followed up as soon as possible or go to the Emergency Department if any problems should occur.  Please show the CHEMOTHERAPY ALERT CARD or IMMUNOTHERAPY ALERT CARD at check-in to the Emergency Department and triage nurse.  Should you have questions after your visit or need to cancel or reschedule your appointment, please contact [facility]  Dept: [phone]  and follow the prompts.  Office hours are 8:00 a.m. to 4:30 p.m. Monday - Friday. Please note that voicemails left after  4:00 p.m. may not be returned until the following business day.  We are closed weekends and major holidays. You have access to a nurse at all times for urgent questions. Please call the main number to the clinic Dept: [phone] and follow the prompts.   For any non-urgent questions, you may also contact your provider using MyChart. We now offer e-Visits for anyone 1 and older to request care online for  non-urgent symptoms. For details visit [email or web address].   Also download the MyChart app! Go to the app store, search "MyChart", open the app, select [facility], and log in with your MyChart username and password.  Due to Covid, a mask is required upon entering the hospital/clinic. If you do not have a mask, one will be given to you upon arrival. For doctor visits, patients may have 1 support person aged 28 or older with them. For treatment visits, patients cannot have anyone with them due to current Covid guidelines and our immunocompromised population.   Oxaliplatin Injection What is this medication? OXALIPLATIN (ox AL i PLA tin) is a chemotherapy drug. It targets fast dividing cells, like cancer cells, and causes these cells to die. This medicine is used to treat cancers of the colon and rectum, and many other cancers. This medicine may be used for other purposes; ask your health care provider or pharmacist if you have questions. COMMON BRAND NAME(S): Eloxatin What should I tell my care team before I take this medication? They need to know if you have any of these conditions: heart disease history of irregular heartbeat liver disease low blood counts, like white cells, platelets, or red blood cells lung or breathing disease, like asthma take medicines that treat or prevent blood clots tingling of the fingers or toes, or other nerve disorder an unusual or allergic reaction to oxaliplatin, other chemotherapy, other medicines, foods, dyes, or preservatives pregnant or trying to get pregnant breast-feeding How should I use this medication? This drug is given as an infusion into a vein. It is administered in a hospital or clinic by a specially trained health care professional. Talk to your pediatrician regarding the use of this medicine in children. Special care may be needed. Overdosage: If you think you have taken too much of this medicine contact a poison control center or emergency  room at once. NOTE: This medicine is only for you. Do not share this medicine with others. What if I miss a dose? It is important not to miss a dose. Call your doctor or health care professional if you are unable to keep an appointment. What may interact with this medication? Do not take this medicine with any of the following medications: cisapride dronedarone pimozide thioridazine This medicine may also interact with the following medications: aspirin and aspirin-like medicines certain medicines that treat or prevent blood clots like warfarin, apixaban, dabigatran, and rivaroxaban cisplatin cyclosporine diuretics medicines for infection like acyclovir, adefovir, amphotericin B, bacitracin, cidofovir, foscarnet, ganciclovir, gentamicin, pentamidine, vancomycin NSAIDs, medicines for pain and inflammation, like ibuprofen or naproxen other medicines that prolong the QT interval (an abnormal heart rhythm) pamidronate zoledronic acid This list may not describe all possible interactions. Give your health care provider a list of all the medicines, herbs, non-prescription drugs, or dietary supplements you use. Also tell them if you smoke, drink alcohol, or use illegal drugs. Some items may interact with your medicine. What should I watch for while using this medication? Your condition will be monitored carefully while you are receiving this medicine. You  may need blood work done while you are taking this medicine. This medicine may make you feel generally unwell. This is not uncommon as chemotherapy can affect healthy cells as well as cancer cells. Report any side effects. Continue your course of treatment even though you feel ill unless your healthcare professional tells you to stop. This medicine can make you more sensitive to cold. Do not drink cold drinks or use ice. Cover exposed skin before coming in contact with cold temperatures or cold objects. When out in cold weather wear warm clothing  and cover your mouth and nose to warm the air that goes into your lungs. Tell your doctor if you get sensitive to the cold. Do not become pregnant while taking this medicine or for 9 months after stopping it. Women should inform their health care professional if they wish to become pregnant or think they might be pregnant. Men should not father a child while taking this medicine and for 6 months after stopping it. There is potential for serious side effects to an unborn child. Talk to your health care professional for more information. Do not breast-feed a child while taking this medicine or for 3 months after stopping it. This medicine has caused ovarian failure in some women. This medicine may make it more difficult to get pregnant. Talk to your health care professional if you are concerned about your fertility. This medicine has caused decreased sperm counts in some men. This may make it more difficult to father a child. Talk to your health care professional if you are concerned about your fertility. This medicine may increase your risk of getting an infection. Call your health care professional for advice if you get a fever, chills, or sore throat, or other symptoms of a cold or flu. Do not treat yourself. Try to avoid being around people who are sick. Avoid taking medicines that contain aspirin, acetaminophen, ibuprofen, naproxen, or ketoprofen unless instructed by your health care professional. These medicines may hide a fever. Be careful brushing or flossing your teeth or using a toothpick because you may get an infection or bleed more easily. If you have any dental work done, tell your dentist you are receiving this medicine. What side effects may I notice from receiving this medication? Side effects that you should report to your doctor or health care professional as soon as possible: allergic reactions like skin rash, itching or hives, swelling of the face, lips, or tongue breathing  problems cough low blood counts - this medicine may decrease the number of white blood cells, red blood cells, and platelets. You may be at increased risk for infections and bleeding nausea, vomiting pain, redness, or irritation at site where injected pain, tingling, numbness in the hands or feet signs and symptoms of bleeding such as bloody or black, tarry stools; red or dark brown urine; spitting up blood or brown material that looks like coffee grounds; red spots on the skin; unusual bruising or bleeding from the eyes, gums, or nose signs and symptoms of a dangerous change in heartbeat or heart rhythm like chest pain; dizziness; fast, irregular heartbeat; palpitations; feeling faint or lightheaded; falls signs and symptoms of infection like fever; chills; cough; sore throat; pain or trouble passing urine signs and symptoms of liver injury like dark yellow or brown urine; general ill feeling or flu-like symptoms; light-colored stools; loss of appetite; nausea; right upper belly pain; unusually weak or tired; yellowing of the eyes or skin signs and symptoms of low red  blood cells or anemia such as unusually weak or tired; feeling faint or lightheaded; falls signs and symptoms of muscle injury like dark urine; trouble passing urine or change in the amount of urine; unusually weak or tired; muscle pain; back pain Side effects that usually do not require medical attention (report to your doctor or health care professional if they continue or are bothersome): changes in taste diarrhea gas hair loss loss of appetite mouth sores This list may not describe all possible side effects. Call your doctor for medical advice about side effects. You may report side effects to FDA at 1-800-FDA-1088. Where should I keep my medication? This drug is given in a hospital or clinic and will not be stored at home. NOTE: This sheet is a summary. It may not cover all possible information. If you have questions about  this medicine, talk to your doctor, pharmacist, or health care provider.  2022 Elsevier/Gold Standard (2021-05-02 00:00:00)  Leucovorin injection What is this medication? LEUCOVORIN (loo koe VOR in) is used to prevent or treat the harmful effects of some medicines. This medicine is used to treat anemia caused by a low amount of folic acid in the body. It is also used with 5-fluorouracil (5-FU) to treat colon cancer. This medicine may be used for other purposes; ask your health care provider or pharmacist if you have questions. What should I tell my care team before I take this medication? They need to know if you have any of these conditions: anemia from low levels of vitamin B-12 in the blood an unusual or allergic reaction to leucovorin, folic acid, other medicines, foods, dyes, or preservatives pregnant or trying to get pregnant breast-feeding How should I use this medication? This medicine is for injection into a muscle or into a vein. It is given by a health care professional in a hospital or clinic setting. Talk to your pediatrician regarding the use of this medicine in children. Special care may be needed. Overdosage: If you think you have taken too much of this medicine contact a poison control center or emergency room at once. NOTE: This medicine is only for you. Do not share this medicine with others. What if I miss a dose? This does not apply. What may interact with this medication? capecitabine fluorouracil phenobarbital phenytoin primidone trimethoprim-sulfamethoxazole This list may not describe all possible interactions. Give your health care provider a list of all the medicines, herbs, non-prescription drugs, or dietary supplements you use. Also tell them if you smoke, drink alcohol, or use illegal drugs. Some items may interact with your medicine. What should I watch for while using this medication? Your condition will be monitored carefully while you are receiving this  medicine. This medicine may increase the side effects of 5-fluorouracil, 5-FU. Tell your doctor or health care professional if you have diarrhea or mouth sores that do not get better or that get worse. What side effects may I notice from receiving this medication? Side effects that you should report to your doctor or health care professional as soon as possible: allergic reactions like skin rash, itching or hives, swelling of the face, lips, or tongue breathing problems fever, infection mouth sores unusual bleeding or bruising unusually weak or tired Side effects that usually do not require medical attention (report to your doctor or health care professional if they continue or are bothersome): constipation or diarrhea loss of appetite nausea, vomiting This list may not describe all possible side effects. Call your doctor for medical advice about  side effects. You may report side effects to FDA at 1-800-FDA-1088. Where should I keep my medication? This drug is given in a hospital or clinic and will not be stored at home. NOTE: This sheet is a summary. It may not cover all possible information. If you have questions about this medicine, talk to your doctor, pharmacist, or health care provider.  2022 Elsevier/Gold Standard (2008-02-19 00:00:00)  Irinotecan injection What is this medication? IRINOTECAN (ir in oh TEE kan ) is a chemotherapy drug. It is used to treat colon and rectal cancer. This medicine may be used for other purposes; ask your health care provider or pharmacist if you have questions. COMMON BRAND NAME(S): Camptosar What should I tell my care team before I take this medication? They need to know if you have any of these conditions: dehydration diarrhea infection (especially a virus infection such as chickenpox, cold sores, or herpes) liver disease low blood counts, like low white cell, platelet, or red cell counts low levels of calcium, magnesium, or potassium in the  blood recent or ongoing radiation therapy an unusual or allergic reaction to irinotecan, other medicines, foods, dyes, or preservatives pregnant or trying to get pregnant breast-feeding How should I use this medication? This drug is given as an infusion into a vein. It is administered in a hospital or clinic by a specially trained health care professional. Talk to your pediatrician regarding the use of this medicine in children. Special care may be needed. Overdosage: If you think you have taken too much of this medicine contact a poison control center or emergency room at once. NOTE: This medicine is only for you. Do not share this medicine with others. What if I miss a dose? It is important not to miss your dose. Call your doctor or health care professional if you are unable to keep an appointment. What may interact with this medication? Do not take this medicine with any of the following medications: cobicistat itraconazole This medicine may interact with the following medications: antiviral medicines for HIV or AIDS certain antibiotics like rifampin or rifabutin certain medicines for fungal infections like ketoconazole, posaconazole, and voriconazole certain medicines for seizures like carbamazepine, phenobarbital, phenotoin clarithromycin gemfibrozil nefazodone St. John's Wort This list may not describe all possible interactions. Give your health care provider a list of all the medicines, herbs, non-prescription drugs, or dietary supplements you use. Also tell them if you smoke, drink alcohol, or use illegal drugs. Some items may interact with your medicine. What should I watch for while using this medication? Your condition will be monitored carefully while you are receiving this medicine. You will need important blood work done while you are taking this medicine. This drug may make you feel generally unwell. This is not uncommon, as chemotherapy can affect healthy cells as well as  cancer cells. Report any side effects. Continue your course of treatment even though you feel ill unless your doctor tells you to stop. In some cases, you may be given additional medicines to help with side effects. Follow all directions for their use. You may get drowsy or dizzy. Do not drive, use machinery, or do anything that needs mental alertness until you know how this medicine affects you. Do not stand or sit up quickly, especially if you are an older patient. This reduces the risk of dizzy or fainting spells. Call your health care professional for advice if you get a fever, chills, or sore throat, or other symptoms of a cold or flu. Do  not treat yourself. This medicine decreases your body's ability to fight infections. Try to avoid being around people who are sick. Avoid taking products that contain aspirin, acetaminophen, ibuprofen, naproxen, or ketoprofen unless instructed by your doctor. These medicines may hide a fever. This medicine may increase your risk to bruise or bleed. Call your doctor or health care professional if you notice any unusual bleeding. Be careful brushing and flossing your teeth or using a toothpick because you may get an infection or bleed more easily. If you have any dental work done, tell your dentist you are receiving this medicine. Do not become pregnant while taking this medicine or for 6 months after stopping it. Women should inform their health care professional if they wish to become pregnant or think they might be pregnant. Men should not father a child while taking this medicine and for 3 months after stopping it. There is potential for serious side effects to an unborn child. Talk to your health care professional for more information. Do not breast-feed an infant while taking this medicine or for 7 days after stopping it. This medicine has caused ovarian failure in some women. This medicine may make it more difficult to get pregnant. Talk to your health care  professional if you are concerned about your fertility. This medicine has caused decreased sperm counts in some men. This may make it more difficult to father a child. Talk to your health care professional if you are concerned about your fertility. What side effects may I notice from receiving this medication? Side effects that you should report to your doctor or health care professional as soon as possible: allergic reactions like skin rash, itching or hives, swelling of the face, lips, or tongue chest pain diarrhea flushing, runny nose, sweating during infusion low blood counts - this medicine may decrease the number of white blood cells, red blood cells and platelets. You may be at increased risk for infections and bleeding. nausea, vomiting pain, swelling, warmth in the leg signs of decreased platelets or bleeding - bruising, pinpoint red spots on the skin, black, tarry stools, blood in the urine signs of infection - fever or chills, cough, sore throat, pain or difficulty passing urine signs of decreased red blood cells - unusually weak or tired, fainting spells, lightheadedness Side effects that usually do not require medical attention (report to your doctor or health care professional if they continue or are bothersome): constipation hair loss headache loss of appetite mouth sores stomach pain This list may not describe all possible side effects. Call your doctor for medical advice about side effects. You may report side effects to FDA at 1-800-FDA-1088. Where should I keep my medication? This drug is given in a hospital or clinic and will not be stored at home. NOTE: This sheet is a summary. It may not cover all possible information. If you have questions about this medicine, talk to your doctor, pharmacist, or health care provider.  2022 Elsevier/Gold Standard (2021-05-02 00:00:00)  Fluorouracil, 5-FU injection What is this medication? FLUOROURACIL, 5-FU (flure oh YOOR a sil) is  a chemotherapy drug. It slows the growth of cancer cells. This medicine is used to treat many types of cancer like breast cancer, colon or rectal cancer, pancreatic cancer, and stomach cancer. This medicine may be used for other purposes; ask your health care provider or pharmacist if you have questions. COMMON BRAND NAME(S): Adrucil What should I tell my care team before I take this medication? They need to know if  you have any of these conditions: blood disorders dihydropyrimidine dehydrogenase (DPD) deficiency infection (especially a virus infection such as chickenpox, cold sores, or herpes) kidney disease liver disease malnourished, poor nutrition recent or ongoing radiation therapy an unusual or allergic reaction to fluorouracil, other chemotherapy, other medicines, foods, dyes, or preservatives pregnant or trying to get pregnant breast-feeding How should I use this medication? This drug is given as an infusion or injection into a vein. It is administered in a hospital or clinic by a specially trained health care professional. Talk to your pediatrician regarding the use of this medicine in children. Special care may be needed. Overdosage: If you think you have taken too much of this medicine contact a poison control center or emergency room at once. NOTE: This medicine is only for you. Do not share this medicine with others. What if I miss a dose? It is important not to miss your dose. Call your doctor or health care professional if you are unable to keep an appointment. What may interact with this medication? Do not take this medicine with any of the following medications: live virus vaccines This medicine may also interact with the following medications: medicines that treat or prevent blood clots like warfarin, enoxaparin, and dalteparin This list may not describe all possible interactions. Give your health care provider a list of all the medicines, herbs, non-prescription drugs,  or dietary supplements you use. Also tell them if you smoke, drink alcohol, or use illegal drugs. Some items may interact with your medicine. What should I watch for while using this medication? Visit your doctor for checks on your progress. This drug may make you feel generally unwell. This is not uncommon, as chemotherapy can affect healthy cells as well as cancer cells. Report any side effects. Continue your course of treatment even though you feel ill unless your doctor tells you to stop. In some cases, you may be given additional medicines to help with side effects. Follow all directions for their use. Call your doctor or health care professional for advice if you get a fever, chills or sore throat, or other symptoms of a cold or flu. Do not treat yourself. This drug decreases your body's ability to fight infections. Try to avoid being around people who are sick. This medicine may increase your risk to bruise or bleed. Call your doctor or health care professional if you notice any unusual bleeding. Be careful brushing and flossing your teeth or using a toothpick because you may get an infection or bleed more easily. If you have any dental work done, tell your dentist you are receiving this medicine. Avoid taking products that contain aspirin, acetaminophen, ibuprofen, naproxen, or ketoprofen unless instructed by your doctor. These medicines may hide a fever. Do not become pregnant while taking this medicine. Women should inform their doctor if they wish to become pregnant or think they might be pregnant. There is a potential for serious side effects to an unborn child. Talk to your health care professional or pharmacist for more information. Do not breast-feed an infant while taking this medicine. Men should inform their doctor if they wish to father a child. This medicine may lower sperm counts. Do not treat diarrhea with over the counter products. Contact your doctor if you have diarrhea that lasts  more than 2 days or if it is severe and watery. This medicine can make you more sensitive to the sun. Keep out of the sun. If you cannot avoid being in the sun, wear  protective clothing and use sunscreen. Do not use sun lamps or tanning beds/booths. What side effects may I notice from receiving this medication? Side effects that you should report to your doctor or health care professional as soon as possible: allergic reactions like skin rash, itching or hives, swelling of the face, lips, or tongue low blood counts - this medicine may decrease the number of white blood cells, red blood cells and platelets. You may be at increased risk for infections and bleeding. signs of infection - fever or chills, cough, sore throat, pain or difficulty passing urine signs of decreased platelets or bleeding - bruising, pinpoint red spots on the skin, black, tarry stools, blood in the urine signs of decreased red blood cells - unusually weak or tired, fainting spells, lightheadedness breathing problems changes in vision chest pain mouth sores nausea and vomiting pain, swelling, redness at site where injected pain, tingling, numbness in the hands or feet redness, swelling, or sores on hands or feet stomach pain unusual bleeding Side effects that usually do not require medical attention (report to your doctor or health care professional if they continue or are bothersome): changes in finger or toe nails diarrhea dry or itchy skin hair loss headache loss of appetite sensitivity of eyes to the light stomach upset unusually teary eyes This list may not describe all possible side effects. Call your doctor for medical advice about side effects. You may report side effects to FDA at 1-800-FDA-1088. Where should I keep my medication? This drug is given in a hospital or clinic and will not be stored at home. NOTE: This sheet is a summary. It may not cover all possible information. If you have questions about  this medicine, talk to your doctor, pharmacist, or health care provider.  2022 [employer] (2021-05-02 00:00:00)

## 2021-08-05 ENCOUNTER — Inpatient Hospital Stay: Payer: 59

## 2021-08-05 ENCOUNTER — Other Ambulatory Visit: Payer: Self-pay

## 2021-08-05 VITALS — BP 115/70 | HR 75 | Temp 97.5°F | Resp 20

## 2021-08-05 DIAGNOSIS — C250 Malignant neoplasm of head of pancreas: Secondary | ICD-10-CM

## 2021-08-05 DIAGNOSIS — Z5111 Encounter for antineoplastic chemotherapy: Secondary | ICD-10-CM | POA: Diagnosis not present

## 2021-08-05 MED ORDER — SODIUM CHLORIDE 0.9% FLUSH
10.0000 mL | INTRAVENOUS | Status: DC | PRN
  Administered 2021-08-05: 10 mL

## 2021-08-05 MED ORDER — HEPARIN SOD (PORK) LOCK FLUSH 100 UNIT/ML IV SOLN
500.0000 [IU] | Freq: Once | INTRAVENOUS | Status: AC | PRN
  Administered 2021-08-05: 500 [IU]

## 2021-08-08 ENCOUNTER — Encounter: Payer: Self-pay | Admitting: Hematology

## 2021-08-23 ENCOUNTER — Encounter: Payer: Self-pay | Admitting: Hematology

## 2021-08-23 ENCOUNTER — Encounter: Payer: Self-pay | Admitting: Family Medicine

## 2021-08-24 ENCOUNTER — Ambulatory Visit: Payer: 59

## 2021-08-24 ENCOUNTER — Other Ambulatory Visit: Payer: 59

## 2021-08-24 ENCOUNTER — Ambulatory Visit: Payer: 59 | Admitting: Physician Assistant

## 2021-09-05 ENCOUNTER — Encounter (HOSPITAL_COMMUNITY): Payer: Self-pay

## 2021-09-07 ENCOUNTER — Other Ambulatory Visit: Payer: 59

## 2021-09-07 ENCOUNTER — Inpatient Hospital Stay: Payer: 59

## 2021-09-07 ENCOUNTER — Ambulatory Visit: Payer: 59 | Admitting: Hematology

## 2021-09-09 ENCOUNTER — Inpatient Hospital Stay: Payer: 59

## 2021-11-30 ENCOUNTER — Telehealth: Payer: Self-pay

## 2021-11-30 ENCOUNTER — Other Ambulatory Visit: Payer: Self-pay

## 2021-11-30 NOTE — Telephone Encounter (Signed)
Dr. Johny Blamer with [facility] [phone]) called requesting pt's most recent PET & CT Scan results.  Fax confirmation received.   ?

## 2022-03-19 ENCOUNTER — Other Ambulatory Visit: Payer: Self-pay

## 2022-04-11 ENCOUNTER — Other Ambulatory Visit: Payer: Self-pay

## 2022-05-21 ENCOUNTER — Encounter: Payer: Self-pay | Admitting: *Deleted

## 2022-05-27 DEATH — deceased

## 2022-06-21 IMAGING — MR MR 3D RECON AT [facility]
17 of 20 series · 40 of 48 positions shown · IV contrast (10 ML GADAVIST)
Comparison: CT January 11, 2021.

CLINICAL DATA: Abdominal pain, suspected biliary obstruction in a
54-year-old female.

EXAM:
MRI ABDOMEN WITHOUT AND WITH CONTRAST (INCLUDING MRCP)
TECHNIQUE: Multiplanar multisequence MR imaging of the abdomen was performed
both before and after the administration of intravenous contrast.
Heavily T2-weighted images of the biliary and pancreatic ducts were
obtained, and three-dimensional MRCP images were rendered by post
processing.
CONTRAST:  10mL GADAVIST GADOBUTROL 1 MMOL/ML IV SOLN

[Series 3: T2 fat-sat · axial · 6.0mm · 1.25mm/px · 1 of 36 slices shown]
[im 1/36]
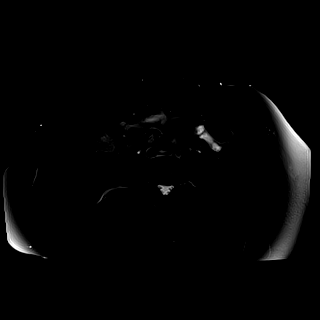

[Series 5: DWI · axial · 6.0mm · 1.49mm/px · z∈[-118,+134]mm · 3 of 72 slices shown (1 of 2)]
[im 1/72]
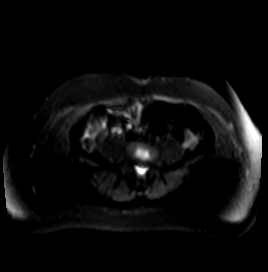
[im 36/72]
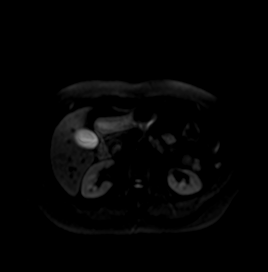
[im 72/72]
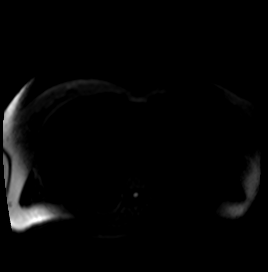

[Series 6: DWI · axial · 6.0mm · 1.49mm/px · 1 of 36 slices shown (2 of 2)]
[im 1/36]
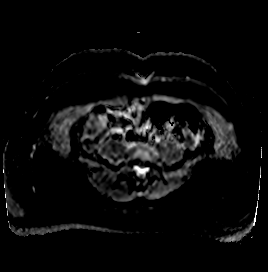

[Series 8: cor_3d_spc_trig · coronal · 1.0mm · 0.49mm/px · 3 of 88 slices shown]
[im 1/88]
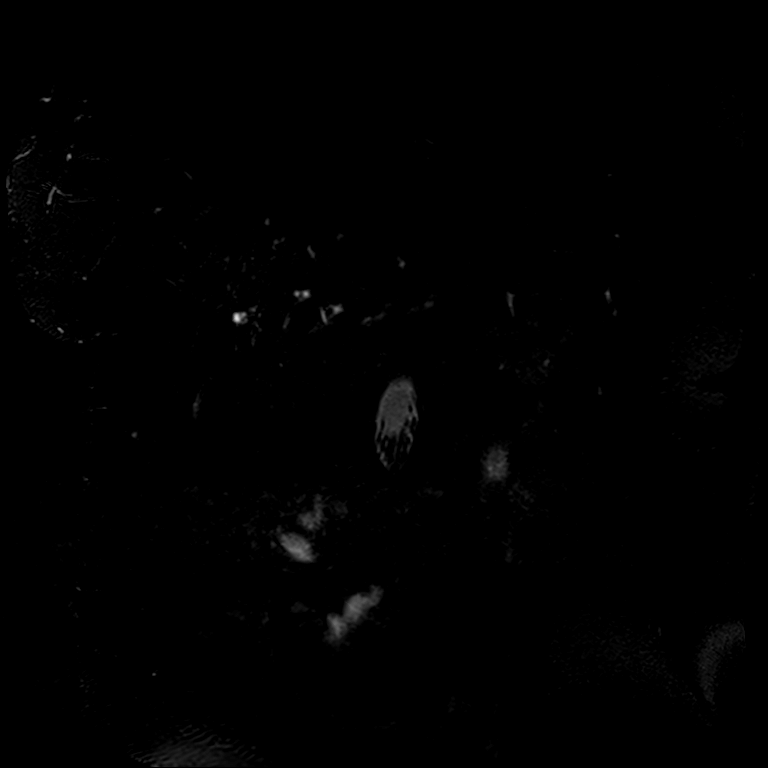
[im 44/88]
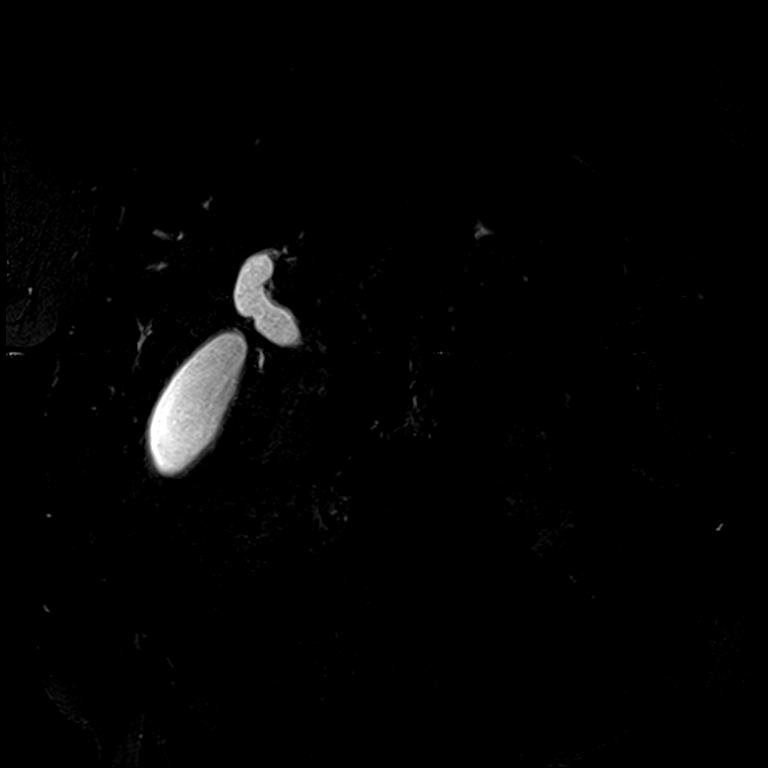
[im 88/88]
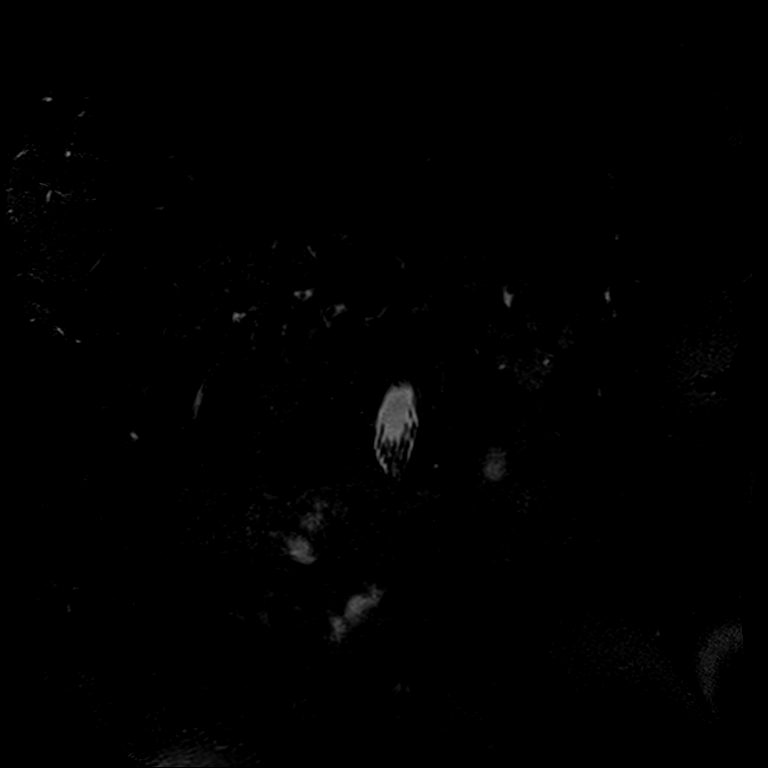

[Series 11: T2 · coronal · 6.0mm · 1.48mm/px · 1 of 34 slices shown (1 of 2)]
[im 1/34]
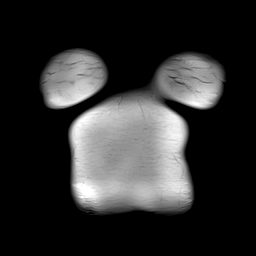

[Series 12: T1 · axial · 3.0mm · 1.25mm/px · z∈[-107,+130]mm · 3 of 80 slices shown (1 of 2)]
[im 1/80]
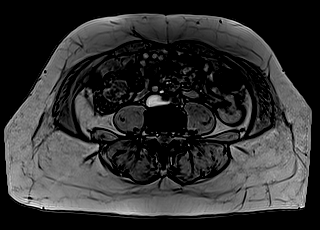
[im 40/80]
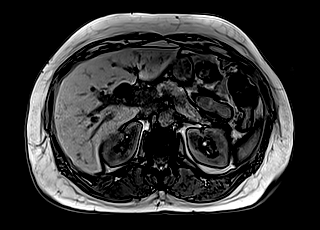
[im 80/80]
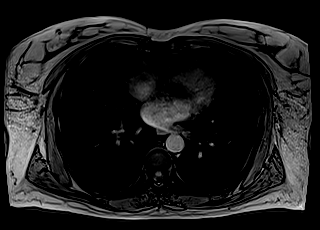

[Series 13: T1 · axial · 3.0mm · 1.25mm/px · z∈[-107,+130]mm · 3 of 80 slices shown (2 of 2)]
[im 1/80]
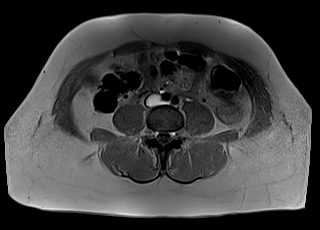
[im 40/80]
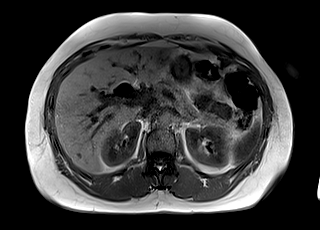
[im 80/80]
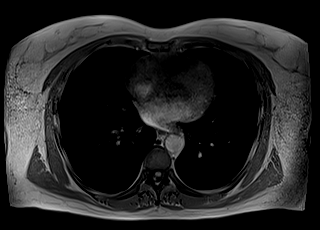

[Series 14: cor obl thk · sagittal · 50.0mm · 0.78mm/px · 1 of 9 slices shown]
[im 1/9]
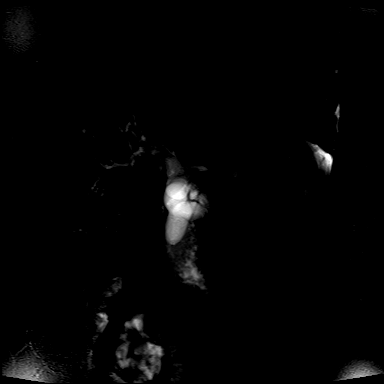

[Series 15: T2 · axial · 6.0mm · 1.56mm/px · 1 of 38 slices shown (2 of 2)]
[im 1/38]
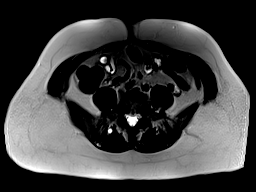

[Series 17: T1 dynamic · axial · 3.0mm · 1.25mm/px · z∈[-125,+112]mm · 3 of 80 slices shown (1 of 8)]
[im 1/80]
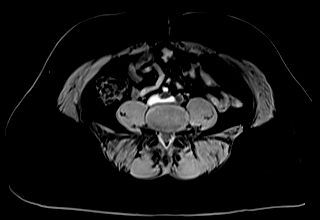
[im 40/80]
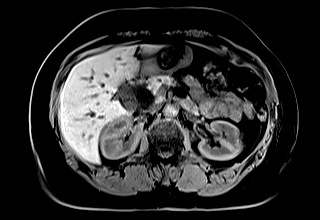
[im 80/80]
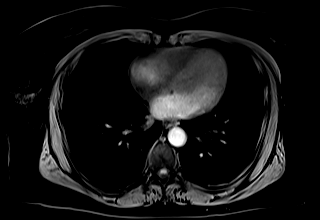

[Series 21: T1 dynamic · axial · 3.0mm · 1.25mm/px · z∈[-125,+112]mm · 3 of 80 slices shown (2 of 8)]
[im 1/80]
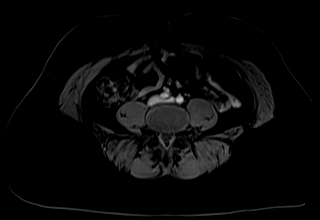
[im 40/80]
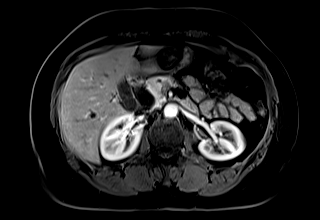
[im 80/80]
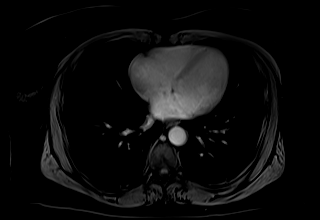

[Series 22: T1 dynamic · axial · 3.0mm · 1.25mm/px · z∈[-125,+112]mm · 3 of 80 slices shown (3 of 8)]
[im 1/80]
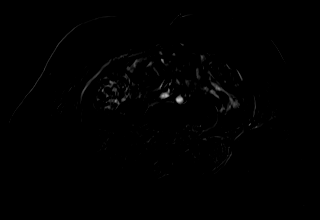
[im 40/80]
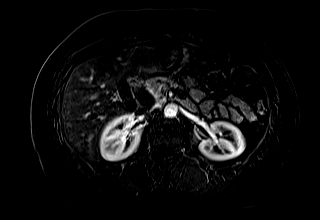
[im 80/80]
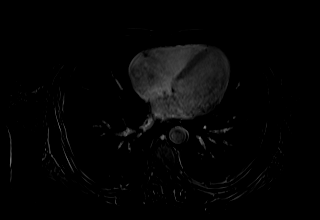

[Series 25: T1 dynamic · axial · 3.0mm · 1.25mm/px · z∈[-125,+112]mm · 3 of 80 slices shown (4 of 8)]
[im 1/80]
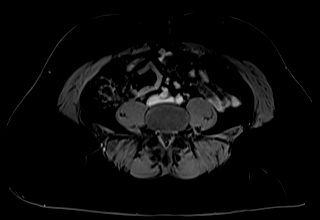
[im 40/80]
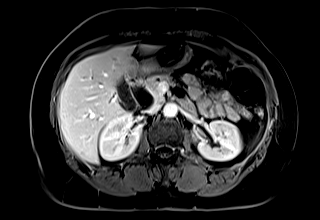
[im 80/80]
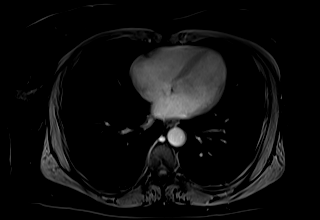

[Series 26: T1 dynamic · axial · 3.0mm · 1.25mm/px · z∈[-125,+112]mm · 3 of 80 slices shown (5 of 8)]
[im 1/80]
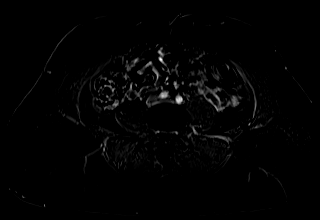
[im 40/80]
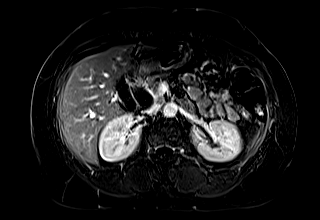
[im 80/80]
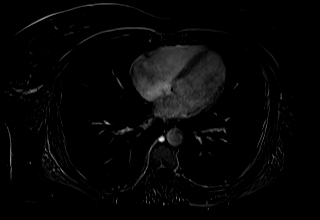

[Series 29: T1 dynamic · axial · 3.0mm · 1.25mm/px · z∈[-125,+112]mm · 3 of 80 slices shown (6 of 8)]
[im 1/80]
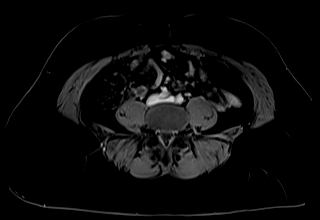
[im 40/80]
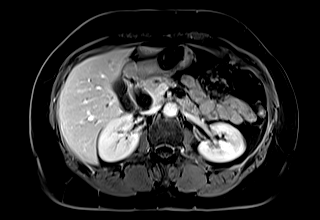
[im 80/80]
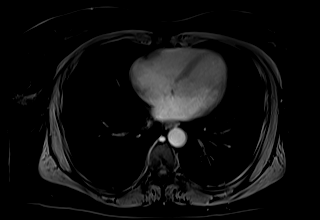

[Series 30: T1 dynamic · axial · 3.0mm · 1.25mm/px · z∈[-125,+112]mm · 3 of 80 slices shown (7 of 8)]
[im 1/80]
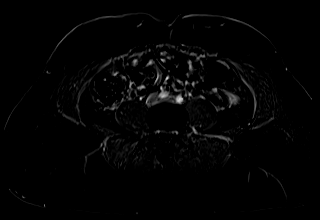
[im 40/80]
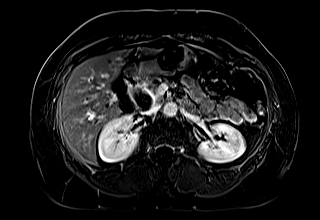
[im 80/80]
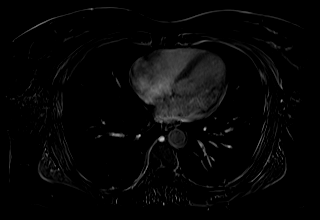

[Series 32: T1 dynamic · coronal · 4.0mm · 1.41mm/px · 2 of 56 slices shown (8 of 8)]
[im 1/56]
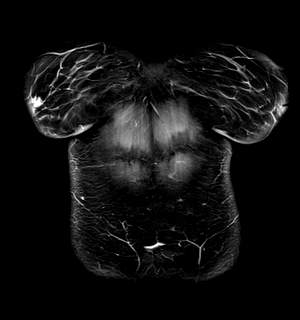
[im 56/56]
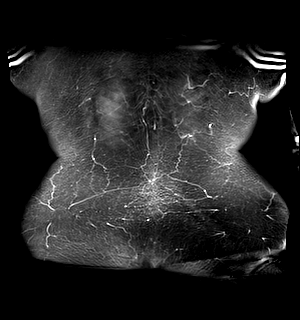

[40 of 48 positions shown; findings below may reference images not displayed]

FINDINGS: Lower chest: No effusion. No consolidation. Limited assessment of
the lung bases on MRI.

Hepatobiliary: Marked biliary duct dilation with profound
extrahepatic biliary duct distension and moderate intrahepatic
biliary duct distension. Abrupt caliber change in the pancreatic
portion of the common bile duct. No filling defect in the common
bile duct or in the biliary tree. No pancreatic ductal dilation.
Hepatic cyst in the LEFT hepatic lobe. Gallbladder is moderately
distended without pericholecystic stranding or cholelithiasis.
Sludge in the lumen of gallbladder.

Lobulated lesion in the LEFT hepatic lobe, medial segment measuring
2.8 x 2.5 cm some peripheral enhancement and some enhancement that
is slightly more central on earlier phase is than would be expected
for a classic hemangioma. Fading pattern seen on diffusion. Marked
T2 hyperintensity and well-circumscribed margins.

Small cysts elsewhere in the liver.

Pancreas: No peripancreatic stranding. Normal intrinsic T1 signal.
No substantial ductal dilation or visible lesion at the site of
common duct transition.

Spleen:  Normal size and contour.

Adrenals/Urinary Tract: Normal adrenal glands. Symmetric renal
enhancement. No hydronephrosis. No perinephric stranding.

Stomach/Bowel: Gastrointestinal tract is unremarkable to the extent
evaluated on abdominal MRI.

Vascular/Lymphatic: Abdominal aorta is patent without dilation.
There is no gastrohepatic or hepatoduodenal ligament
lymphadenopathy. No retroperitoneal or mesenteric lymphadenopathy.

Other:  No ascites.

Musculoskeletal: No suspicious bone lesions identified.
IMPRESSION: Biliary duct distension of both intra and extrahepatic biliary tree
with abrupt caliber change but with no visible mass on imaging.
Caliber change more abrupt than expected for stricture and the
biliary tree was top-normal size at 6 mm in [id number]. Endoscopic
assessment is suggested to exclude an occult biliary lesion or
atypical stricture.

Lobulated T2 bright lesion in the medial segment of the LEFT hepatic
lobe that favors a sclerosed hemangioma. There is some internal
septation suggested and the filling pattern is slightly atypical.
Consider 3 to six-month follow-up with extended delays following
contrast to 7 minutes better assess filling characteristics,
differential consideration would include a biliary cystadenoma with
septal enhancement.

## 2022-08-09 ENCOUNTER — Encounter: Payer: Self-pay | Admitting: *Deleted
